# Patient Record
Sex: Male | Born: 1945 | Race: White | Hispanic: No | Marital: Married | Smoking: Former smoker
Health system: Southern US, Community
[De-identification: ages and names within clinical notes are randomized; demographics above are authoritative.]

## PROBLEM LIST (undated history)

## (undated) DIAGNOSIS — Z9981 Dependence on supplemental oxygen: Secondary | ICD-10-CM

## (undated) DIAGNOSIS — F17210 Nicotine dependence, cigarettes, uncomplicated: Secondary | ICD-10-CM

## (undated) DIAGNOSIS — I499 Cardiac arrhythmia, unspecified: Secondary | ICD-10-CM

## (undated) DIAGNOSIS — J45909 Unspecified asthma, uncomplicated: Secondary | ICD-10-CM

## (undated) DIAGNOSIS — I2510 Atherosclerotic heart disease of native coronary artery without angina pectoris: Secondary | ICD-10-CM

## (undated) DIAGNOSIS — J189 Pneumonia, unspecified organism: Secondary | ICD-10-CM

## (undated) DIAGNOSIS — R519 Headache, unspecified: Secondary | ICD-10-CM

## (undated) DIAGNOSIS — I4891 Unspecified atrial fibrillation: Secondary | ICD-10-CM

## (undated) DIAGNOSIS — K219 Gastro-esophageal reflux disease without esophagitis: Secondary | ICD-10-CM

## (undated) DIAGNOSIS — Z8719 Personal history of other diseases of the digestive system: Secondary | ICD-10-CM

## (undated) DIAGNOSIS — J449 Chronic obstructive pulmonary disease, unspecified: Secondary | ICD-10-CM

## (undated) DIAGNOSIS — R51 Headache: Secondary | ICD-10-CM

## (undated) DIAGNOSIS — I219 Acute myocardial infarction, unspecified: Secondary | ICD-10-CM

## (undated) DIAGNOSIS — M1990 Unspecified osteoarthritis, unspecified site: Secondary | ICD-10-CM

## (undated) DIAGNOSIS — R4702 Dysphasia: Secondary | ICD-10-CM

## (undated) DIAGNOSIS — I5022 Chronic systolic (congestive) heart failure: Secondary | ICD-10-CM

## (undated) HISTORY — PX: BOWEL RESECTION: SHX1257

## (undated) HISTORY — PX: INGUINAL HERNIA REPAIR: SUR1180

## (undated) HISTORY — PX: CARDIAC CATHETERIZATION: SHX172

## (undated) HISTORY — PX: HERNIA REPAIR: SHX51

## (undated) HISTORY — PX: FOREARM FRACTURE SURGERY: SHX649

## (undated) HISTORY — PX: FRACTURE SURGERY: SHX138

## (undated) HISTORY — PX: FOOT FRACTURE SURGERY: SHX645

## (undated) HISTORY — DX: Chronic obstructive pulmonary disease, unspecified: J44.9

## (undated) HISTORY — DX: Atherosclerotic heart disease of native coronary artery without angina pectoris: I25.10

## (undated) HISTORY — PX: ABDOMINAL HERNIA REPAIR: SHX539

## (undated) HISTORY — DX: Nicotine dependence, cigarettes, uncomplicated: F17.210

## (undated) HISTORY — PX: EXPLORATORY LAPAROTOMY: SUR591

---

## 1984-03-26 HISTORY — PX: EXPLORATORY LAPAROTOMY: SUR591

## 1988-03-26 HISTORY — PX: ORIF FOOT FRACTURE: SHX2123

## 1999-08-01 ENCOUNTER — Inpatient Hospital Stay (HOSPITAL_COMMUNITY): Admission: EM | Admit: 1999-08-01 | Discharge: 1999-08-02 | Payer: Self-pay | Admitting: Emergency Medicine

## 1999-08-08 ENCOUNTER — Encounter: Admission: RE | Admit: 1999-08-08 | Discharge: 1999-08-08 | Payer: Self-pay | Admitting: Sports Medicine

## 2004-01-26 ENCOUNTER — Ambulatory Visit: Payer: Self-pay | Admitting: Family Medicine

## 2004-03-15 ENCOUNTER — Ambulatory Visit: Payer: Self-pay | Admitting: Family Medicine

## 2004-04-07 ENCOUNTER — Ambulatory Visit: Payer: Self-pay | Admitting: Family Medicine

## 2004-04-08 ENCOUNTER — Inpatient Hospital Stay (HOSPITAL_COMMUNITY): Admission: EM | Admit: 2004-04-08 | Discharge: 2004-04-09 | Payer: Self-pay | Admitting: Emergency Medicine

## 2004-04-24 ENCOUNTER — Ambulatory Visit: Payer: Self-pay | Admitting: Internal Medicine

## 2004-05-30 ENCOUNTER — Ambulatory Visit: Payer: Self-pay | Admitting: Family Medicine

## 2004-06-01 ENCOUNTER — Ambulatory Visit: Payer: Self-pay | Admitting: Internal Medicine

## 2004-06-21 ENCOUNTER — Ambulatory Visit: Payer: Self-pay | Admitting: Family Medicine

## 2004-07-05 ENCOUNTER — Ambulatory Visit: Payer: Self-pay | Admitting: Family Medicine

## 2004-07-12 ENCOUNTER — Ambulatory Visit: Payer: Self-pay | Admitting: Internal Medicine

## 2004-07-26 ENCOUNTER — Ambulatory Visit: Payer: Self-pay | Admitting: Family Medicine

## 2004-09-05 ENCOUNTER — Ambulatory Visit: Payer: Self-pay | Admitting: Internal Medicine

## 2004-09-12 ENCOUNTER — Ambulatory Visit: Payer: Self-pay | Admitting: Internal Medicine

## 2004-09-25 ENCOUNTER — Ambulatory Visit: Payer: Self-pay | Admitting: Family Medicine

## 2004-10-26 ENCOUNTER — Ambulatory Visit: Payer: Self-pay | Admitting: Internal Medicine

## 2004-11-15 ENCOUNTER — Ambulatory Visit: Payer: Self-pay | Admitting: Family Medicine

## 2004-11-30 ENCOUNTER — Ambulatory Visit: Payer: Self-pay | Admitting: Family Medicine

## 2004-12-14 ENCOUNTER — Ambulatory Visit: Payer: Self-pay | Admitting: Family Medicine

## 2004-12-16 ENCOUNTER — Emergency Department (HOSPITAL_COMMUNITY): Admission: EM | Admit: 2004-12-16 | Discharge: 2004-12-16 | Payer: Self-pay | Admitting: Emergency Medicine

## 2004-12-19 ENCOUNTER — Ambulatory Visit: Payer: Self-pay | Admitting: Family Medicine

## 2005-01-05 ENCOUNTER — Ambulatory Visit: Payer: Self-pay | Admitting: Family Medicine

## 2005-01-29 ENCOUNTER — Ambulatory Visit: Payer: Self-pay | Admitting: Family Medicine

## 2005-03-02 ENCOUNTER — Ambulatory Visit: Payer: Self-pay | Admitting: Family Medicine

## 2005-04-02 ENCOUNTER — Ambulatory Visit: Payer: Self-pay | Admitting: Family Medicine

## 2005-04-26 ENCOUNTER — Encounter (HOSPITAL_COMMUNITY): Admission: RE | Admit: 2005-04-26 | Discharge: 2005-05-26 | Payer: Self-pay | Admitting: Preventative Medicine

## 2005-05-21 ENCOUNTER — Ambulatory Visit: Payer: Self-pay | Admitting: Family Medicine

## 2005-05-24 HISTORY — PX: INCISIONAL HERNIA REPAIR: SHX193

## 2005-06-04 ENCOUNTER — Emergency Department (HOSPITAL_COMMUNITY): Admission: EM | Admit: 2005-06-04 | Discharge: 2005-06-04 | Payer: Self-pay | Admitting: Emergency Medicine

## 2005-06-15 ENCOUNTER — Inpatient Hospital Stay (HOSPITAL_COMMUNITY): Admission: RE | Admit: 2005-06-15 | Discharge: 2005-06-22 | Payer: Self-pay | Admitting: General Surgery

## 2005-10-16 ENCOUNTER — Ambulatory Visit: Payer: Self-pay | Admitting: Family Medicine

## 2006-04-01 ENCOUNTER — Ambulatory Visit: Payer: Self-pay | Admitting: Internal Medicine

## 2006-07-15 ENCOUNTER — Emergency Department (HOSPITAL_COMMUNITY): Admission: EM | Admit: 2006-07-15 | Discharge: 2006-07-15 | Payer: Self-pay | Admitting: Emergency Medicine

## 2006-08-07 ENCOUNTER — Emergency Department (HOSPITAL_COMMUNITY): Admission: EM | Admit: 2006-08-07 | Discharge: 2006-08-07 | Payer: Self-pay | Admitting: Emergency Medicine

## 2006-09-24 HISTORY — PX: VENTRAL HERNIA REPAIR: SHX424

## 2006-09-24 HISTORY — PX: INGUINAL HERNIA REPAIR: SUR1180

## 2006-10-15 ENCOUNTER — Encounter (INDEPENDENT_AMBULATORY_CARE_PROVIDER_SITE_OTHER): Payer: Self-pay | Admitting: General Surgery

## 2006-10-15 ENCOUNTER — Ambulatory Visit (HOSPITAL_COMMUNITY): Admission: RE | Admit: 2006-10-15 | Discharge: 2006-10-16 | Payer: Self-pay | Admitting: General Surgery

## 2007-07-22 ENCOUNTER — Ambulatory Visit: Payer: Self-pay | Admitting: Pulmonary Disease

## 2007-07-22 ENCOUNTER — Ambulatory Visit: Payer: Self-pay | Admitting: Internal Medicine

## 2007-07-22 DIAGNOSIS — J449 Chronic obstructive pulmonary disease, unspecified: Secondary | ICD-10-CM | POA: Insufficient documentation

## 2007-07-22 DIAGNOSIS — J439 Emphysema, unspecified: Secondary | ICD-10-CM | POA: Insufficient documentation

## 2007-08-15 ENCOUNTER — Ambulatory Visit: Payer: Self-pay | Admitting: Internal Medicine

## 2007-08-15 DIAGNOSIS — I10 Essential (primary) hypertension: Secondary | ICD-10-CM | POA: Insufficient documentation

## 2007-08-15 DIAGNOSIS — F17200 Nicotine dependence, unspecified, uncomplicated: Secondary | ICD-10-CM | POA: Insufficient documentation

## 2007-09-02 ENCOUNTER — Ambulatory Visit: Payer: Self-pay | Admitting: Internal Medicine

## 2007-09-29 ENCOUNTER — Emergency Department (HOSPITAL_COMMUNITY): Admission: EM | Admit: 2007-09-29 | Discharge: 2007-09-29 | Payer: Self-pay | Admitting: Emergency Medicine

## 2007-10-01 ENCOUNTER — Ambulatory Visit: Payer: Self-pay | Admitting: Internal Medicine

## 2007-11-03 ENCOUNTER — Ambulatory Visit: Payer: Self-pay | Admitting: Internal Medicine

## 2007-11-07 ENCOUNTER — Encounter: Payer: Self-pay | Admitting: Internal Medicine

## 2007-11-17 ENCOUNTER — Telehealth (INDEPENDENT_AMBULATORY_CARE_PROVIDER_SITE_OTHER): Payer: Self-pay | Admitting: *Deleted

## 2008-12-01 ENCOUNTER — Ambulatory Visit (HOSPITAL_COMMUNITY): Admission: RE | Admit: 2008-12-01 | Discharge: 2008-12-01 | Payer: Self-pay | Admitting: Family Medicine

## 2008-12-23 ENCOUNTER — Ambulatory Visit (HOSPITAL_COMMUNITY): Admission: RE | Admit: 2008-12-23 | Discharge: 2008-12-23 | Payer: Self-pay | Admitting: Family Medicine

## 2010-08-08 NOTE — Op Note (Signed)
NAME:  CHEY, CHO NO.:  [id number]   MEDICAL RECORD NO.:  [id number]          PATIENT TYPE:  OIB   LOCATION:  5731                         FACILITY:  [facility]   PHYSICIAN:  Cherylynn Ridges, M.D.    DATE OF BIRTH:  1945/06/24   DATE OF PROCEDURE:  10/15/2006  DATE OF DISCHARGE:                               OPERATIVE REPORT   PREOPERATIVE DIAGNOSES:  1. Recurrent ventral hernia.  2. Recurrent right inguinal hernia.   POSTOPERATIVE DIAGNOSES:  1. An 8 x 6 cm upper recurrent ventral hernia.  2. Direct and indirect recurrent right inguinal hernia.   SURGEON:  Cherylynn Ridges, M.D.   ASSISTANT:  Leonie Man, M.D.   ANESTHESIA:  General endotracheal.   ESTIMATED BLOOD LOSS:  Less than 50 mL.   COMPLICATIONS:  None.   CONDITION:  Stable.   FINDINGS:  The patient had a 6 x 8 cm ventral hernia in the upper  portion of the previous abdominal incision done from trauma.  In the  right groin, he had a recurrent indirect and direct hernia, both of  which were repaired with mesh.   OPERATION:  The patient was taken to the operating room and placed on  the table in the supine position.  After an adequate endotracheal  anesthetic was administered, he was prepped and draped in the usual  sterile manner, exposing the midline and the right lower quadrant.   We started off repairing the ventral hernia initially.  We excised the  patient's entire abdominal incision which was widened from previous  scarring.  We took it down and removed part of the scarred flap and took  it down to the midline fascia.   In the supraumbilical portion of the incision, there were multiple  fascial defects.  Most of the hernia came out to the right side and was  in a subcutaneous position on the right.  We dissected out this area and  also the hernia sac and its edges.  We opened the peritoneal cavity and  dissected away adhesions around the anterior abdominal wall.  We saw  multiple what  appeared to be Prolene-like sutures.  We made  circumferential flaps around the hernia defect which measured  approximately 6 x 8 cm in size using electrocautery.  The upper portion  of the fascia was weakened also but we did get back to good fascia prior  to placing a piece of onlay Proceed mesh which was cut down from 6 x 8  inches to about 4 x 6 inches.   We used horizontal mattress sutures of #1 Novafil in order to place the  onlay mesh with the rough side facing upward, the smooth side facing  downward.  This was after we had circumferentially taken down all  adhesions and made sufficient flaps.  A total of 8 horizontal mattress  sutures were placed securing the mesh in place and then we did a primary  repair on top of that using interrupted figure-of-eight stitches of #1  Novafil followed by a running stitch of #1 Novafil.  Once this was  closed, we irrigated subcutaneous with saline and antibiotic solution in  which the mesh had been soaked prior to implantation.  We then closed  the subcutaneous tissue using running 2-0 Vicryl and then skin with  stainless steel staples.   We then proceeded to repair the right groin hernia by making a  transverse curvilinear incision at the level of the superficial ring.  We dissected down to and through the Scarpa fascia into the subcutaneous  tissue and down to the fascia of the external oblique.  We were able to  find the Poupart ligament and dissect away from that towards the hernia  which is coming out through the superficial ring.  Once we had  adequately cleaned off the external oblique fascia, we made an opening  and using Metzenbaum scissors going down through the superficial ring.   The patient's spermatic cord was very thinned out from previous surgery  obviously and notes very little pampiniform plexus or arterial blood  flow but we could find the vas deferens.  We mobilized the spermatic  cord and actually separated away from the  recurrent indirect side which  came off anteriorly and medially.  Once we had dissected away this  indirect sac sufficiently, we tied it off at its neck using two suture  ligatures of 0 Ethibond.  We resected excess fat using a 15 blade.  We  then noticed that it was a direct defect more towards the pubic tubercle  which we repaired by placing onlay mesh measuring approximately 4 x 2 cm  in size, attaching it to the pubic tubercle area and conjoint tendon  anteromedially and reflected a portion of the inguinal ligament  inferolaterally.  The protruding direct sac was actually placed back  underneath the defect and then oversewn with three stitches of 0  Ethibond sutures to keep it in place as we placed the mesh in.  The mesh  was placed using a running 0 Prolene.  Once this was done, we irrigated  with antibiotic solution which the mesh had been soaked prior to  implantation.  We then placed the spermatic cord back into the inguinal  canal, reapproximated the external oblique fascia on top of using 3-0  Vicryl.  We then reapproximated the Scarpa fascia using interrupted 3-0  Vicryl and then the skin was closed using stainless steel staples.  Our  needle count, sponge counts and instrument counts were correct.  Sterile  dressings were applied to all wounds.      Cherylynn Ridges, M.D.  Electronically Signed     JOW/MEDQ  D:  10/15/2006  T:  10/15/2006  Job:  [id number]   cc:   Dr. Morrie Sheldon

## 2010-08-11 NOTE — H&P (Signed)
NAME:  Devin Barton, TULLIS NO.:  [id number]   MEDICAL RECORD NO.:  [id number]          PATIENT TYPE:  INP   LOCATION:  0343                         FACILITY:  [facility]   PHYSICIAN:  Toby L. Catalina Pizza, M.D.   DATE OF BIRTH:  01-25-1946   DATE OF ADMISSION:  04/07/2004  DATE OF DISCHARGE:                                HISTORY & PHYSICAL   PRIMARY CARE PHYSICIAN:  Delaney Meigs, M.D. of [place] [place].   REASON FOR VISIT:  Increased shortness of breath and wheezing.   HISTORY OF PRESENT ILLNESS:  Devin Barton is a 65 year old male with a history  of COPD. He presents to the ED today with a 3- to 4-day history of increased  shortness of breath and wheezing. Over the past 2 weeks the patient has been  seen by his primary care doctor on 3 occasions. Per the patient, he was  started on steroids. The steroids have helped some. However, the patient  does continue to have episodes of increased wheezing and shortness of  breath. The patient also has chest congestion. However, he is having some  difficulty getting the sputum up. He does describe pleuritic-like chest pain  that occurs mainly with coughing episodes and deep breaths. This chest pain  is sharp in nature, there is no radiation, it is very brief in duration.  There is no chest pain with ambulation/exertion. There has been no fever or  chills.   PAST MEDICAL HISTORY:  1.  COPD.  2.  Trauma due to falling from a tree.   PAST SURGICAL HISTORY:  The patient had a hernia repair; the patient  sustained this hernia when he fell from the tree.   MEDICINES:  Combivent and Advair.   ALLERGIES:  No known drug allergies.   SOCIAL HISTORY:  The patient smokes 1/2 pack per day for approximately 30  years. He denies alcohol and IV drug abuse. He is married and lives in  [place].   FAMILY HISTORY:  The patient states that both mother and father are healthy.   REVIEW OF SYSTEMS:  A complete review of systems was  obtained, the review  was negative except for that stated in the HPI.   PHYSICAL EXAMINATION:  VITAL SIGNS:  Temperature is 98.4, blood pressure is  126/81, pulse of 76, respiratory rate is 22.  HEENT:  Pupils were equally round and reactive to light. Extraocular muscles  were intact. There was no scleral icterus. Oropharynx was clear and moist,  there was no erythema or thrush. Tympanic membranes were clear bilaterally,  no erythema.  NECK:  No JVD, no carotid bruit, no adenopathy.  HEART:  Regular rate and rhythm; no murmurs, rubs or gallops.  LUNGS:  Decreased air movement bilaterally, scattered wheezes.  ABDOMEN:  Positive bowel sounds, nontender, nondistended, midline scar due  to previous surgery.  EXTREMITIES:  No edema, no cyanosis.   LABORATORY:  A pH was 7.408, PCO2 38.6, PO2 73.4, bicarbonate 23.9; this is  on 2 liters. WBC count 10.4, hemoglobin 14.4, hematocrit 42.6, platelets of  318. Sodium 138, potassium 4.1,  chloride 105, carbon dioxide 25, glucose 99,  BUN 11, creatinine 0.9, calcium 9.2. UA was only significant for 15 ketones.  Chest x-ray showed no acute changes. EKG revealed a normal sinus rhythm and  LVH; of note, there was no previous EKG for comparison.   ASSESSMENT AND PLAN:  1.  Chronic obstructive pulmonary disease exacerbation. Will admit the      patient to a telemetry bed. I will start the patient on Solu-Medrol 125      mg IV q.6h. In addition, the patient will be started on Rocephin and      azithromycin. He will be provided nebulizer treatments with both      Atrovent and albuterol every 4 hours and then as needed. The patient      will also be placed on oxygen at 2 liters.  2.  Pleuritic-like chest pain. I feel that the chest pain is most likely due      to the patient's coughing and shortness of breath. However, he tells me      that it has been going on for 1-2 weeks now. Due to the persistence of      the pain, I will check 3 sets of cardiac  enzymes and repeat an EKG in      the a.m.  3.  The patient is a full code.      TLF/MEDQ  D:  04/08/2004  T:  04/08/2004  Job:  [id number]

## 2010-08-11 NOTE — Discharge Summary (Signed)
[facility]  Patient:    Devin Barton, Devin Barton                         MRN: [id number] Adm. Date:  [id number] Disc. Date: [id number] Attending:  Garnette Scheuermann Dictator:   Cheree Ditto, M.D. CC:         Dr. Celene Skeen, [facility]                           Discharge Summary  DISCHARGE DIAGNOSES: 1. Syncope, likely secondary to dehydration or hypotension, hypoxia still in    the differential. 2. Febrile respiratory illness, probable early pneumonia. 3. Chronic obstructive pulmonary disease.  DISCHARGE MEDICATIONS: 1. Azithromycin 250 mg 1 p.o. q.d. x 3 more days. 2. Combivent 2 puffs q.i.d.  PROCEDURES:  None.  CONSULTATIONS:  None.  HISTORY AND PHYSICAL:  Please see complete dictated history and physical from Aug 01, 1999.  HOSPITAL COURSE:  Mr. Hintz is a 65 year old male who presented after having a syncopal episode in his primary doctors office.  #1 - SYNCOPE:  The patient was admitted and monitored on telemetry.  He had no further syncopal episodes during hospitalization.  No arrhythmias were noted on telemetry.  He did have some orthostatic changes on admission, which resolved by the time of discharge after IV fluid rehydration.  The patient had normal room air O2 saturations throughout the hospitalization.  The etiology of his syncope was felt to be secondary to hypotension related to an early pneumonia.  #2 - FEBRILE RESPIRATORY ILLNESS:  The patient did not have a definite infiltrate on chest x-ray but he did have some increased lung markings felt to be an early pneumonia, especially given his fever.  He was treated with azithromycin, which was continued on discharge for a total of five days of antibiotics.  He was afebrile and feeling well throughout the day of discharge.  #3 - CHRONIC OBSTRUCTIVE PULMONARY DISEASE:  The patient was started on Combivent 2 puffs q.i.d.  It was thought that he was on theophylline  but this turned out to be incorrect.  He was discharged on Combivent.  We recommend pulmonary function tests as an outpatient to further define his lung disease and help to maximally manage his problem.  DISPOSITION:  The patient was discharged in improved condition with normal room air O2 saturations and no fever.  FOLLOW-UP:  Appointment made with Dr. Celene Skeen at [facility] on Monday, Aug 07, 1999, at 8:15 a.m. DD:  08/02/99 TD:  08/04/99 Job: [id number]

## 2010-08-11 NOTE — Op Note (Signed)
NAME:  Devin Barton, Devin Barton NO.:  [id number]   MEDICAL RECORD NO.:  [id number]          PATIENT TYPE:  INP   LOCATION:  A303                          FACILITY:  [facility]   PHYSICIAN:  Dirk Dress. Katrinka Blazing, M.D.   DATE OF BIRTH:  1946-02-01   DATE OF PROCEDURE:  DATE OF DISCHARGE:                                 OPERATIVE REPORT   PREOPERATIVE DIAGNOSIS:  Incisional hernia.   POSTOPERATIVE DIAGNOSIS:  Incisional hernia.   PROCEDURE:  Incisional hernia repair.   SURGEON:  Dirk Dress. Katrinka Blazing, M.D.   DESCRIPTION OF PROCEDURE:  Under general anesthesia, the patient's abdomen  was prepped and draped in a sterile field.  The old midline incision was  excised.  The excision extended down to the subcutaneous tissue.  The  patient had four areas of herniation through the fascia in the  supraumbilical midline.  These areas were connected.  The underlying bowel  and omentum were separated from the fascia.  The inferior portion of the  fascia appeared to be intact.  Once good fascial margins were obtained, it  was felt that the incision was going to be too tight to close primarily, so  a 3 x 7 cm matrix allograft was used for closure.  This was sewn in using  running #1 Prolene.  The JP drain was placed over the graft.  The  subcutaneous tissue was closed with 2-0 Monocryl.  The skin was closed with  staples.  The drain was secured with 3-0 nylon.  A sterile dressing was  placed.  The patient tolerated the procedure well.  He was awakened from  anesthesia, transferred to a bed, and taken to the Post Anesthetic Care Unit  for monitoring.      Dirk Dress. Katrinka Blazing, M.D.  Electronically Signed     LCS/MEDQ  D:  06/15/2005  T:  06/19/2005  Job:  [id number]   cc:   Delaney Meigs, M.D.  Fax: [phone]

## 2010-08-11 NOTE — Assessment & Plan Note (Signed)
[facility]                             PULMONARY OFFICE NOTE   NAME:Sikorski, ANDRZEJ SCULLY                       MRN:          [id number]  DATE:04/01/2006                            DOB:          May 17, 1945    PULMONARY/FOLLOWUP OFFICE VISIT   HISTORY:  This is a 65 year old white male, active smoker with COPD with  an FEV1 of 40% documented September 05, 2004,  who ran out of Advair over 2  months ago and comes in today with increasing symptoms of cough and  congestion with yellow sputum production over the 3 days. He denies any  pleuritic pain, fevers, chills, orthopnea, PND or leg swelling.   Note, his last Combivent dose was 3 hours ago.   PHYSICAL EXAMINATION:  He is a depressed-appearing, ambulatory, white  male in no acute distress. He has stable vital signs.  HEENT: Is unremarkable. Oropharynx is clear. No thrush.  NECK: Supple without cervical adenopathy or tenderness. Trachea is  midline. No thyromegaly.  LUNGS: Lung fields reveal junky expiratory rhonchi with prominent pseudo  wheeze as well as which improved with purse lip maneuver.  HEART: Regular rate and rhythm without murmur, gallop or rub.  ABDOMEN: Soft, benign.  EXTREMITIES: Warm without calf tenderness, cyanosis, clubbing or edema.   Hemoglobin saturation is 94% on room air.   IMPRESSION:  Chronic obstructive pulmonary disease with an active  asthmatic component secondary to active smoking against medical advice.  I have told the patient point blank that if he continues to smoke there  will be very little I can do for him. If he quits smoking he probably  does not need to return here at all, but could be easily controlled with  Advair mono therapy.   For today, I spent extra time teaching him how to use Advair effectively  250/50 b.i.d. and gave him samples. I also reviewed with him optimal MDI  technique and asked him to continue Combivent 2 puffs every 4 hours  p.r.n.   To treat  him acutely, I did recommend Omnicef for 7 day course, 300 mg  b.i.d. and Mucinex DM along with a 6 day course of prednisone.   Followup will be in 3 months, sooner if needed.    Charlaine Dalton. Sherene Sires, MD, [facility]  Electronically Signed   MBW/MedQ  DD: 04/01/2006  DT: 04/01/2006  Job #: [id number]   cc:   Delaney Meigs, M.D.

## 2010-08-11 NOTE — Discharge Summary (Signed)
NAME:  Devin, Barton NO.:  [id number]   MEDICAL RECORD NO.:  [id number]          PATIENT TYPE:  INP   LOCATION:  A303                          FACILITY:  [facility]   PHYSICIAN:  Dirk Dress. Katrinka Blazing, M.D.   DATE OF BIRTH:  10-Nov-1945   DATE OF ADMISSION:  06/15/2005  DATE OF DISCHARGE:  03/30/2007LH                                 DISCHARGE SUMMARY   DISCHARGE DIAGNOSIS:  1.  Incisional hernia.  2.  Bronchial asthma.  3.  Postoperative ileus.   SPECIAL PROCEDURE:  Incisional hernia repair with Matrix allograft June 15, 2005.   DISPOSITION:  The patient discharged home in stable satisfactory condition.   DISCHARGE MEDICATIONS:  1.  Reglan 10 mg a.c. and h.s.  2.  Zelnorm 60 mg twice daily.  3.  Tylox 1-2 every 4 hours as needed for pain.  4.  Singulair 10 mg daily.  5.  Lipitor 10 mg daily.  6.  Advair 250/50 twice daily.  7.  Spiriva once daily.  8.  Keflex 500 mg four times daily.  The patient is scheduled to be seen in      the office one week post discharge.   SUMMARY:  A 65 year old male with a history of enlarging incisional hernia.  He is status post self-inflicted abdominal wound in 1986.  The wound had  been previously repaired at [facility].  He presented with a large  incisional hernia.  Past history is positive for asthma.  Lungs revealed  positive rhonchi and wheezes.  Abdominal exam revealed large midline  incisional hernia at the apex of his incision in the periumbilical area.  The patient underwent repair of his incisional hernia using 3 cm x 7 cm  Matrix allograft on June 15, 2005.  He had postoperative ileus with slow  return of intestinal function.  He was treated with nasogastric  decompression and because of ileus.  After return of intestinal function,  his diet was advanced.  Nasogastric tube was clamped.  Nausea and vomiting  resolved.  He started having regular bowel movements and was discharged home  on the seventh postoperative  day in satisfactory condition.      Dirk Dress. Katrinka Blazing, M.D.  Electronically Signed    LCS/MEDQ  D:  08/11/2005  T:  08/12/2005  Job:  [id number]

## 2010-08-11 NOTE — H&P (Signed)
NAME:  Devin Barton, Devin Barton NO.:  [id number]   MEDICAL RECORD NO.:  [id number]          PATIENT TYPE:  AMB   LOCATION:  DAY                           FACILITY:  [facility]   PHYSICIAN:  Jerolyn Shin C. Katrinka Blazing, M.D.   DATE OF BIRTH:  1946/02/02   DATE OF ADMISSION:  DATE OF DISCHARGE:  LH                                HISTORY & PHYSICAL   This is a 65 year old male with history of enlarging incisional hernia.  He  is status post a self-inflicted abdominal wound in 1986.  It was repaired at  [facility].  He presents with a large incision hernia that has been  progressively enlarging.  He is scheduled for incisional hernia repair.   PAST MEDICAL HISTORY:  Positive for:  1.  Asthma.  2.  Hyperlipidemia.   MEDICATIONS:  1.  Singulair 10 mg daily.  2.  Lipitor 10 mg daily.  3.  Advair 250/50 twice daily.  4.  Spiriva once daily.   PAST SURGICAL HISTORY:  1.  Exploratory laparotomy in 1986.  2.  Open reduction and internal fixation left arm in 1990.   SOCIAL HISTORY:  He is employed at [employer].  He smokes 6 cigarettes a  day.  He has not had any alcohol intake for over 30 years.   PHYSICAL EXAMINATION:  VITAL SIGNS: Blood pressure 152/80, pulse 80,  respirations 20, weight 174 pounds.  HEENT:  Unremarkable.  NECK:  Supple with no JVP, bruit, adenopathy, or thyromegaly.  CHEST: Positive rhonchi, positive wheezes.  HEART:  Regular rate and rhythm without murmur, gallop, or rub.  ABDOMEN: Soft.  There is a large midline incisional hernia at the apex of  the incision and in the periumbilical area.  EXTREMITIES:  1+ edema.  No cyanosis or clubbing.  NEUROLOGIC:  No focal motor, sensory, or cerebellar deficits.   IMPRESSION:  1.  Incisional hernia.  2.  Bronchial asthma.   PLAN:  Incisional hernia repair.      Dirk Dress. Katrinka Blazing, M.D.  Electronically Signed     LCS/MEDQ  D:  06/14/2005  T:  06/14/2005  Job:  [id number]   cc:   Delaney Meigs, M.D.  Fax:  [phone]   [facility]  Fax: [phone]

## 2010-08-11 NOTE — Discharge Summary (Signed)
NAME:  Devin Barton, Devin Barton                ACCOUNT NO.:  [id number]   MEDICAL RECORD NO.:  [id number]          PATIENT TYPE:  INP   LOCATION:  0343                         FACILITY:  [facility]   PHYSICIAN:  Mobolaji B. Bakare, M.D.DATE OF BIRTH:  May 17, 1945   DATE OF ADMISSION:  04/07/2004  DATE OF DISCHARGE:  04/09/2004                                 DISCHARGE SUMMARY   PRIMARY CARE PHYSICIAN:  Dr. Lysbeth Galas in [place].   FINAL DIAGNOSES:  1.  Chronic obstructive pulmonary disease exacerbation.  2.  Tobacco abuse.   CHIEF COMPLAINT:  Shortness of breath and wheezing.   BRIEF HISTORY:  Please refer to the admission history and physical.  In  brief, Devin Barton is a 65 year old Caucasian male with history of COPD.  He  presented with exacerbation of COPD and was admitted for acute management.  He was afebrile.   PERTINENT PHYSICAL FINDINGS:  Vital signs on admission:  Temperature 98.4,  blood pressure 126/81, pulse of 76, respiratory rate of 22.  He was dyspneic  on initial evaluation.  Main findings were in the respiratory exam.  There  was reduced air entry bilaterally with scattered wheezes.  The rest of his  physical examination were within normal.   PERTINENT LABORATORY DATA:  Sodium 137, potassium 3.7, chloride 108, bicarb  23, BUN 11, creatinine 0.9, glucose 185.  White cell 11.5, hematocrit 40.9,  hemoglobin 13.7, platelets 316, MCV 93.  Please note that initial white cell  count on admission was 7.4.  The patient was placed on Solu-Medrol.  Radiologic findings:  Chest x-ray:  No acute cardiopulmonary findings.  EKG:  Normal sinus rhythm with a QTC of 476.  This was later rechecked.  QTC at  time of discharge was 439.   HOSPITAL COURSE:  CHRONIC OBSTRUCTIVE PULMONARY DISEASE EXACERBATION.  Mr.  Barton has significant history of cigarette smoking.  He smokes a half a  pack per day for approximately 30 years and he wishes to quit smoking.  We  discussed at length regarding the benefits  of quitting smoking and the  patient agrees to smoking cessation.  He claims he has tried patches in the  past.  He did agree to try Zyban and nicotine patch combination.  Was  treated with IV Solu-Medrol, ceftriaxone, and Zithromax.  He was placed on  sliding scale insulin with respect to hyperglycemia secondary to steroid.  The patient may a remarkable turn-around and within 48 hours lungs were  clear, no more wheezes, and still there was reduced air entry.  It was then  felt that the patient could be discharged home.  He was ambulated in the  hallway and did not drop his O2 saturation.  The patient was discharged home  in a stable condition.   DISCHARGE MEDICATIONS:  1.  Combivent and Advair inhalers as before.  2.  Avelox 400 mg p.o. daily for 6 days.  3.  Wellbutrin 150 mg p.o. b.i.d.  4.  Prednisone tapering dose.  5.  Nicotine patch 21 mg daily.   He was encouraged to continue to quit smoking.  Follow-up  with Dr. Lysbeth Galas in  1-2 weeks and the patient to call for an appointment.      MBB/MEDQ  D:  04/17/2004  T:  04/17/2004  Job:  [id number]   cc:   Delaney Meigs, M.D.  [address]  [place]  [place] [address]  Fax: [phone]

## 2010-09-08 ENCOUNTER — Encounter: Payer: Self-pay | Admitting: Internal Medicine

## 2010-09-13 ENCOUNTER — Ambulatory Visit (INDEPENDENT_AMBULATORY_CARE_PROVIDER_SITE_OTHER): Payer: Self-pay | Admitting: Internal Medicine

## 2010-09-13 ENCOUNTER — Encounter: Payer: Self-pay | Admitting: Internal Medicine

## 2010-09-13 VITALS — BP 134/84 | HR 83 | Temp 97.5°F | Ht 68.0 in | Wt 157.0 lb

## 2010-09-13 DIAGNOSIS — J449 Chronic obstructive pulmonary disease, unspecified: Secondary | ICD-10-CM

## 2010-09-13 DIAGNOSIS — F17200 Nicotine dependence, unspecified, uncomplicated: Secondary | ICD-10-CM

## 2010-09-13 MED ORDER — FLUTICASONE-SALMETEROL 250-50 MCG/DOSE IN AEPB: 1.0000 | INHALATION_SPRAY | Freq: Two times a day (BID) | RESPIRATORY_TRACT | Status: DC

## 2010-09-13 NOTE — Progress Notes (Signed)
Subjective:     Patient ID: Devin Barton, male   DOB: [birth year], 65 y.o.   MRN: [id number]  HPI  2  yowm with  GOLD III COPD current smoker  seen 4/28 for 3 weeks of cough, wheezing, DOE, yellow mucus worse at night. Had been started on ACE I for HTN 1 month prior to this exacerbation and was treated with a course of antibiotics and prednisone but still had difficulites with cough and dyspnea. Better overall after changed over to Symbicort.  Returned 6/9 improved with less cough and dyspnea. Symbicort costs $50 co-pay-so changed back to advair and on return 7/8 having again severe paroxysms of cough and dyspnea to the point where he can no longer work. I only found out about this at the end of the visit when he requested a work excuse. apparently this occurred after a spell where he lost his voice began feeling choked and very short of breath.   November 03, 2007 ov says couldn't take symbicort due dizziness, using combivent up 3 x days and still smoking one half per day.  rec stop smoking  09/13/10 ov cc worse doe since ran out of money to buy combivent but  Rarely now smoking.  No sign excess/ purulent mucus. Pt denies any significant sore throat, dysphagia, itching, sneezing,  nasal congestion or excess/ purulent secretions,  fever, chills, sweats, unintended wt loss, pleuritic or exertional cp, hempoptysis, orthopnea pnd or leg swelling.    Also denies any obvious fluctuation of symptoms with weather or environmental changes or other aggravating or alleviating factors.      Past Medical History:  Reviewed history from 10/01/2007 and no changes required:  HYPERTENSION, BENIGN (ICD-401.1)  CIGARETTE SMOKER (ICD-305.1)  COPD (ICD-496) FEV1 1.29 (40%) ratio 42%    Family History:   negative for respiratory disease atopy  positive heart disease in his mother       Review of Systems  Constitutional: Positive for unexpected weight change. Negative for fever, chills, activity change and  appetite change.  HENT: Positive for congestion and sinus pressure. Negative for sore throat, rhinorrhea, sneezing, trouble swallowing, dental problem, voice change and postnasal drip.   Eyes: Negative for visual disturbance.  Respiratory: Positive for cough and shortness of breath. Negative for choking.   Cardiovascular: Negative for chest pain and leg swelling.  Gastrointestinal: Negative for nausea, vomiting and abdominal pain.  Genitourinary: Negative for difficulty urinating.  Musculoskeletal: Negative for arthralgias.  Skin: Negative for rash.  Psychiatric/Behavioral: Negative for behavioral problems and confusion.       Objective:   Physical Exam    thin amb wm with unusual affect Wt 157 09/13/10 HEENT mild turbinate edema.  Oropharynx no thrush or excess pnd or cobblestoning.  No JVD or cervical adenopathy. Mild accessory muscle hypertrophy. Trachea midline, nl thryroid. Chest was hyperinflated by percussion with diminished breath sounds and moderate increased exp time without wheeze. Hoover sign positive at mid inspiration. Regular rate and rhythm without murmur gallop or rub or increase P2 or edema.  Abd: no hsm, nl excursion. Ext warm without cyanosis or clubbing.   Assessment:         Plan:

## 2010-09-13 NOTE — Patient Instructions (Addendum)
Advair 250/50 twice daily - smooth deep breath then rinse and gargle  Continue to use  ventolin but only use as needed if resting first  doesn't help your breathing.   Return here if not happy with the advair or if the doctors in [place] feel you need a pulmonary evaluation

## 2010-09-15 NOTE — Assessment & Plan Note (Signed)
C/w GOLD III with variable component so best choice is restart advair 250/50 bid and work harder on smoking cessation

## 2010-09-15 NOTE — Assessment & Plan Note (Signed)
I emphasized that although we never turn away smokers from the pulmonary clinic, we do ask that they understand that the recommendations that we make  won't work nearly as well in the presence of continued cigarette exposure.  In fact, we may very well  reach a point where we can't promise to help the patient if he/she can't quit smoking. (We can and will promise to try to help, we just can't promise what we recommend will really work)  

## 2010-12-21 LAB — BASIC METABOLIC PANEL
CO2: 27
Calcium: 9.5
Creatinine, Ser: 0.96
GFR calc Af Amer: >60
GFR calc non Af Amer: >60

## 2010-12-21 LAB — CBC
MCHC: 33.6
RBC: 4.3
RDW: 14.7

## 2010-12-21 LAB — URINALYSIS, ROUTINE W REFLEX MICROSCOPIC
Hgb urine dipstick: NEGATIVE
Nitrite: NEGATIVE
Protein, ur: NEGATIVE
Urobilinogen, UA: 0.2

## 2011-01-08 LAB — BASIC METABOLIC PANEL
CO2: 28
Calcium: 9.7
GFR calc Af Amer: >60
GFR calc non Af Amer: >60
Sodium: 139

## 2011-01-08 LAB — DIFFERENTIAL
Lymphocytes Relative: 32
Lymphs Abs: 3.4 — ABNORMAL HIGH
Monocytes Absolute: 0.9 — ABNORMAL HIGH
Monocytes Relative: 8
Neutro Abs: 6

## 2011-01-08 LAB — CBC
Hemoglobin: 14.3
RBC: 4.62

## 2011-09-20 ENCOUNTER — Other Ambulatory Visit: Payer: Self-pay | Admitting: Internal Medicine

## 2012-04-09 ENCOUNTER — Other Ambulatory Visit: Payer: Self-pay | Admitting: Internal Medicine

## 2012-05-09 ENCOUNTER — Other Ambulatory Visit: Payer: Self-pay | Admitting: Internal Medicine

## 2012-05-13 NOTE — Addendum Note (Signed)
Addended by: Abigail Miyamoto D on: 05/13/2012 11:17 AM   Modules accepted: Orders

## 2012-06-05 ENCOUNTER — Inpatient Hospital Stay (HOSPITAL_COMMUNITY)
Admission: EM | Admit: 2012-06-05 | Discharge: 2012-06-07 | DRG: 191 | Disposition: A | Discharge status: Home / Self Care | Payer: Medicare Other | Attending: Internal Medicine | Admitting: Internal Medicine

## 2012-06-05 ENCOUNTER — Encounter (HOSPITAL_COMMUNITY): Payer: Self-pay | Admitting: Emergency Medicine

## 2012-06-05 ENCOUNTER — Inpatient Hospital Stay (HOSPITAL_COMMUNITY): Payer: Medicare Other

## 2012-06-05 ENCOUNTER — Emergency Department (HOSPITAL_COMMUNITY): Payer: Medicare Other

## 2012-06-05 DIAGNOSIS — F17200 Nicotine dependence, unspecified, uncomplicated: Secondary | ICD-10-CM | POA: Diagnosis present

## 2012-06-05 DIAGNOSIS — R739 Hyperglycemia, unspecified: Secondary | ICD-10-CM | POA: Diagnosis present

## 2012-06-05 DIAGNOSIS — J189 Pneumonia, unspecified organism: Secondary | ICD-10-CM

## 2012-06-05 DIAGNOSIS — E440 Moderate protein-calorie malnutrition: Secondary | ICD-10-CM | POA: Diagnosis present

## 2012-06-05 DIAGNOSIS — I10 Essential (primary) hypertension: Secondary | ICD-10-CM | POA: Diagnosis present

## 2012-06-05 DIAGNOSIS — R7309 Other abnormal glucose: Secondary | ICD-10-CM | POA: Diagnosis present

## 2012-06-05 DIAGNOSIS — T380X5A Adverse effect of glucocorticoids and synthetic analogues, initial encounter: Secondary | ICD-10-CM | POA: Diagnosis present

## 2012-06-05 DIAGNOSIS — Z79899 Other long term (current) drug therapy: Secondary | ICD-10-CM

## 2012-06-05 DIAGNOSIS — R634 Abnormal weight loss: Secondary | ICD-10-CM

## 2012-06-05 DIAGNOSIS — J441 Chronic obstructive pulmonary disease with (acute) exacerbation: Principal | ICD-10-CM | POA: Diagnosis present

## 2012-06-05 DIAGNOSIS — IMO0002 Reserved for concepts with insufficient information to code with codable children: Secondary | ICD-10-CM

## 2012-06-05 DIAGNOSIS — T50905A Adverse effect of unspecified drugs, medicaments and biological substances, initial encounter: Secondary | ICD-10-CM

## 2012-06-05 DIAGNOSIS — J449 Chronic obstructive pulmonary disease, unspecified: Secondary | ICD-10-CM

## 2012-06-05 DIAGNOSIS — D72829 Elevated white blood cell count, unspecified: Secondary | ICD-10-CM | POA: Diagnosis present

## 2012-06-05 LAB — BASIC METABOLIC PANEL
BUN: 23 mg/dL (ref 6–23)
CO2: 21 mEq/L (ref 19–32)
Calcium: 9.3 mg/dL (ref 8.4–10.5)
Chloride: 99 mEq/L (ref 96–112)
Creatinine, Ser: 0.94 mg/dL (ref 0.50–1.35)
GFR calc Af Amer: >90 mL/min (ref >90)
GFR calc non Af Amer: 85 mL/min — ABNORMAL LOW (ref >90)
Glucose, Bld: 110 mg/dL — ABNORMAL HIGH (ref 70–99)
Potassium: 3.9 mEq/L (ref 3.5–5.1)
Sodium: 134 mEq/L — ABNORMAL LOW (ref 135–145)

## 2012-06-05 LAB — CBC
HCT: 41.7 % (ref 39.0–52.0)
Hemoglobin: 14.3 g/dL (ref 13.0–17.0)
MCH: 31.2 pg (ref 26.0–34.0)
MCHC: 34.3 g/dL (ref 30.0–36.0)
MCV: 90.8 fL (ref 78.0–100.0)
Platelets: 282 10*3/uL (ref 150–400)
RBC: 4.59 MIL/uL (ref 4.22–5.81)
RDW: 15.4 % (ref 11.5–15.5)
WBC: 17.7 10*3/uL — ABNORMAL HIGH (ref 4.0–10.5)

## 2012-06-05 LAB — PRO B NATRIURETIC PEPTIDE: Pro B Natriuretic peptide (BNP): 161.5 pg/mL — ABNORMAL HIGH (ref 0–125)

## 2012-06-05 MED ORDER — MOMETASONE FURO-FORMOTEROL FUM 100-5 MCG/ACT IN AERO
2.0000 | INHALATION_SPRAY | Freq: Two times a day (BID) | RESPIRATORY_TRACT | Status: DC
  Administered 2012-06-06: 2 via RESPIRATORY_TRACT
  Filled 2012-06-05 (×2): qty 8.8

## 2012-06-05 MED ORDER — PREDNISONE 50 MG PO TABS
60.0000 mg | ORAL_TABLET | Freq: Once | ORAL | Status: AC
  Administered 2012-06-05: 60 mg via ORAL
  Filled 2012-06-05: qty 1

## 2012-06-05 MED ORDER — TAMSULOSIN HCL 0.4 MG PO CAPS
0.4000 mg | ORAL_CAPSULE | Freq: Every evening | ORAL | Status: DC
  Administered 2012-06-06: 0.4 mg via ORAL
  Filled 2012-06-05 (×2): qty 1

## 2012-06-05 MED ORDER — ALBUTEROL SULFATE (5 MG/ML) 0.5% IN NEBU: 2.5000 mg | INHALATION_SOLUTION | RESPIRATORY_TRACT | Status: DC | PRN

## 2012-06-05 MED ORDER — LEVOFLOXACIN IN D5W 750 MG/150ML IV SOLN
750.0000 mg | Freq: Once | INTRAVENOUS | Status: AC
  Administered 2012-06-05: 750 mg via INTRAVENOUS
  Filled 2012-06-05: qty 150

## 2012-06-05 MED ORDER — ONDANSETRON HCL 4 MG PO TABS: 4.0000 mg | ORAL_TABLET | Freq: Four times a day (QID) | ORAL | Status: DC | PRN

## 2012-06-05 MED ORDER — METHYLPREDNISOLONE SODIUM SUCC 125 MG IJ SOLR
80.0000 mg | Freq: Four times a day (QID) | INTRAMUSCULAR | Status: DC
  Administered 2012-06-06 – 2012-06-07 (×7): 80 mg via INTRAVENOUS
  Filled 2012-06-05 (×7): qty 2

## 2012-06-05 MED ORDER — IPRATROPIUM BROMIDE 0.02 % IN SOLN
0.5000 mg | Freq: Once | RESPIRATORY_TRACT | Status: AC
  Administered 2012-06-05: 0.5 mg via RESPIRATORY_TRACT
  Filled 2012-06-05: qty 2.5

## 2012-06-05 MED ORDER — ALBUTEROL SULFATE HFA 108 (90 BASE) MCG/ACT IN AERS: 2.0000 | INHALATION_SPRAY | Freq: Four times a day (QID) | RESPIRATORY_TRACT | Status: DC | PRN

## 2012-06-05 MED ORDER — ONDANSETRON HCL 4 MG/2ML IJ SOLN: 4.0000 mg | Freq: Four times a day (QID) | INTRAMUSCULAR | Status: DC | PRN

## 2012-06-05 MED ORDER — HYDROCODONE-HOMATROPINE 5-1.5 MG/5ML PO SYRP: 5.0000 mL | ORAL_SOLUTION | Freq: Four times a day (QID) | ORAL | Status: DC | PRN

## 2012-06-05 MED ORDER — ALBUTEROL (5 MG/ML) CONTINUOUS INHALATION SOLN
10.0000 mg/h | INHALATION_SOLUTION | RESPIRATORY_TRACT | Status: DC
  Administered 2012-06-05: 10 mg/h via RESPIRATORY_TRACT
  Filled 2012-06-05: qty 20

## 2012-06-05 MED ORDER — CEFTRIAXONE SODIUM 1 G IJ SOLR
1.0000 g | INTRAMUSCULAR | Status: DC
  Administered 2012-06-06 (×2): 1 g via INTRAVENOUS
  Filled 2012-06-05 (×3): qty 10

## 2012-06-05 MED ORDER — AZITHROMYCIN 250 MG PO TABS
500.0000 mg | ORAL_TABLET | Freq: Every day | ORAL | Status: DC
  Administered 2012-06-06 – 2012-06-07 (×2): 500 mg via ORAL
  Filled 2012-06-05 (×2): qty 2

## 2012-06-05 MED ORDER — HEPARIN SODIUM (PORCINE) 5000 UNIT/ML IJ SOLN: 5000.0000 [IU] | Freq: Three times a day (TID) | INTRAMUSCULAR | Status: DC

## 2012-06-05 NOTE — ED Provider Notes (Signed)
History     CSN: [id number]  Arrival date & time 06/05/12  4098   First MD Initiated Contact with Patient 06/05/12 2019      Chief Complaint  Patient presents with  . Cough  . Headache  . Shortness of Breath    (Consider location/radiation/quality/duration/timing/severity/associated sxs/prior treatment) Patient is a 67 y.o. male presenting with cough, headaches, and shortness of breath. The history is provided by the patient and the spouse. No language interpreter was used.  Cough Cough characteristics:  Productive Sputum characteristics:  Nondescript Severity:  Mild Onset quality:  Gradual Timing:  Intermittent Progression:  Unchanged Chronicity:  New Smoker: yes   Context: sick contacts, smoke exposure, upper respiratory infection and with activity   Relieved by:  Nothing Worsened by:  Activity Ineffective treatments:  Beta-agonist inhaler and steroid inhaler Associated symptoms: chills, headaches, shortness of breath, sinus congestion, sore throat, weight loss and wheezing   Shortness of breath:    Severity:  Moderate   Onset quality:  Gradual   Timing:  Constant   Progression:  Worsening Weight loss:    Amount:  More than 10 kg (reports 25lbs in past 3-19mo, unintentional ) Headache Associated symptoms: cough, nausea, sore throat and vomiting   Shortness of Breath Associated symptoms: cough, headaches, sore throat, vomiting and wheezing    Pt is a 67yo male with COPD presenting today after a 5 day hx of worsening SOB.  States his home breathing tx are not helping today.   States he has generalized weakness and feels lightheaded when he stands up.  Admits to decreased oral intake, and decreased appetite for past few months.  Has noticed  25lb weight loss in past 3-40mo, unintentional.    Past Medical History  Diagnosis Date  . HTN (hypertension)   . Cigarette smoker   . COPD (chronic obstructive pulmonary disease)     History reviewed. No pertinent past surgical  history.  Family History  Problem Relation Age of Onset  . Atopy Neg Hx   . Heart disease Mother     History  Substance Use Topics  . Smoking status: Current Every Day Smoker -- 1.00 packs/day for 30 years    Types: Cigarettes    Last Attempt to Quit: 08/08/2010  . Smokeless tobacco: Never Used  . Alcohol Use: No      Review of Systems  Constitutional: Positive for chills and weight loss.  HENT: Positive for sore throat.   Respiratory: Positive for cough, shortness of breath and wheezing.   Gastrointestinal: Positive for nausea and vomiting.  Neurological: Positive for headaches.  All other systems reviewed and are negative.    Allergies  Review of patient's allergies indicates no known allergies.  Home Medications   Current Outpatient Rx  Name  Route  Sig  Dispense  Refill  . ADVAIR DISKUS 250-50 MCG/DOSE AEPB      INHALE ONE PUFF BY MOUTH TWICE DAILY   60 each   3   . albuterol (PROAIR HFA) 108 (90 BASE) MCG/ACT inhaler   Inhalation   Inhale 2 puffs into the lungs every 6 (six) hours as needed for wheezing or shortness of breath.         Marland Kitchen albuterol (PROVENTIL) (2.5 MG/3ML) 0.083% nebulizer solution   Nebulization   Take 2.5 mg by nebulization every 6 (six) hours as needed for wheezing or shortness of breath.         Marland Kitchen HYDROcodone-homatropine (HYCODAN) 5-1.5 MG/5ML syrup   Oral  Take 5 mLs by mouth every 6 (six) hours as needed for cough.         . naproxen sodium (ALEVE) 220 MG tablet   Oral   Take 440 mg by mouth daily as needed (for pain).         . tamsulosin (FLOMAX) 0.4 MG CAPS   Oral   Take 0.4 mg by mouth every evening.           BP 104/77  Pulse 127  Temp(Src) 98.5 F (36.9 C) (Oral)  Resp 22  Ht 5\' 8"  (1.727 m)  Wt 158 lb (71.668 kg)  BMI 24.03 kg/m2  SpO2 97%  Physical Exam  Vitals reviewed. Constitutional: He is oriented to person, place, and time.  HENT:  Head: Normocephalic and atraumatic.  Eyes: Conjunctivae  and EOM are normal. Pupils are equal, round, and reactive to light.  Neck: Normal range of motion. Neck supple. No JVD present.  Cardiovascular: Regular rhythm and normal heart sounds.   Tachycardic. No peripheral edema   Pulmonary/Chest: He is in respiratory distress. He has wheezes. He has rales. He exhibits no tenderness.  Diffuse wheezing and rales throughout all lung fields, decreased lung sounds in LLL  Abdominal: Soft. Bowel sounds are normal. He exhibits no distension and no mass. There is no tenderness. There is no rebound and no guarding.  Musculoskeletal: Normal range of motion.  Neurological: He is alert and oriented to person, place, and time.  Skin: Skin is warm and dry. No rash noted. No erythema. No pallor.    ED Course  Procedures (including critical care time)  Labs Reviewed  BASIC METABOLIC PANEL - Abnormal; Notable for the following:    Sodium 134 (*)    Glucose, Bld 110 (*)    GFR calc non Af Amer 85 (*)    All other components within normal limits  CBC - Abnormal; Notable for the following:    WBC 17.7 (*)    All other components within normal limits  PRO B NATRIURETIC PEPTIDE - Abnormal; Notable for the following:    Pro B Natriuretic peptide (BNP) 161.5 (*)    All other components within normal limits  TROPONIN I   Dg Chest 2 View  06/05/2012  *RADIOLOGY REPORT*  Clinical Data: Cough and short of breath  CHEST - 2 VIEW  Comparison: 12/23/2008  Findings: Mild bibasilar airspace disease, most consistent with atelectasis.  Pneumonia not completely excluded.  This was not present previously.  Negative for heart failure or effusion.  IMPRESSION: Mild bibasilar airspace disease which may represent atelectasis or pneumonia   Original Report Authenticated By: Janeece Riggers, M.D.      Date: 06/05/2012  Rate: 122  Rhythm: sinus tachycardia  QRS Axis: normal  Intervals: normal  ST/T Wave abnormalities: normal  Conduction Disutrbances:none  Narrative Interpretation:    Old EKG Reviewed: none available   1. Community acquired pneumonia   2. COPD (chronic obstructive pulmonary disease)       MDM  Pt is a 67yo male with COPD presents with increased worsening of SOB for past week.  Home breathing tx have not helped today.  Denies fever but states he has felt warm.  Reports 25lb weight loss over past 3-14mo, unintentional.  Pt is a smoker. Denies hx of CHF, diabetes, or cancer.     EKG: appears normal, no indication of ventricular hypertrophy or electrolyte abnormalities   CXR indicates mild bibasilar pneumonia.   21:25 Will start IV Levofloxacin 750mg .  Pt still SOB on continuous neb tx.  BP 104/77  Pulse 127  Temp(Src) 98.5 F (36.9 C) (Oral)  Resp 22  Ht 5\' 8"  (1.727 m)  Wt 158 lb (71.668 kg)  BMI 24.03 kg/m2  SpO2 97% Will be admitting pt.    Labs: Troponin: <0.30  CBC-leukocytosis; elevated BNP-mildly elevated, not indicative of CHF.  Hospitalist to admit pt.    Discussed pt with Dr. Eber Hong throughout pt stay in ED.  Agreed with tx plan.           Junius Finner, PA-C 06/05/12 2245

## 2012-06-05 NOTE — ED Provider Notes (Signed)
Medical screening examination/treatment/procedure(s) were conducted as a shared visit with non-physician practitioner(s) and myself.  I personally evaluated the patient during the encounter  Please see my separate respective documentation pertaining to this patient encounter   Vida Roller, MD 06/05/12 2245

## 2012-06-05 NOTE — ED Provider Notes (Signed)
67 year old male with a history of COPD and hypertension presents with a complaint of increased shortness of breath. This is gradually getting worse, associated with a cough and generalized weakness, the patient states he has increased difficulty standing up because of lightheadedness and a feeling of near syncope. He has had decreased oral intake, increased coughing and shortness of breath which does not seem to be getting that much better with his inhaler medications. He also endorses having a 30 pound weight loss over the last several months which is unintentional.  On exam the patient has diffuse mild respiratory wheezing, increased work of breathing with mild accessory muscle use, he has decreased breath sounds at the left base, soft abdomen, clear heart sounds, no peripheral edema or JVD. His mucous membranes are mildly dehydrated, no significant erythema asymmetry exudate of the pharynx.   chest x-ray laboratory work ordered we'll need to evaluate for lung cancer, pneumonia, EKG to evaluate cardiac function and electrolytes abnormalities, albuterol continuous treatment ordered for the patient's increased work of breathing and COPD. Doubt cardiac process as the EKG is normal and the patient does not have a history of heart disease.    Medical screening examination/treatment/procedure(s) were conducted as a shared visit with non-physician practitioner(s) and myself.  I personally evaluated the patient during the encounter    Vida Roller, MD 06/05/12 2049

## 2012-06-05 NOTE — ED Notes (Signed)
Patient c/o cough, headache, shortness of breath and weakness x 3 days.  Respirations even and unlabored; able to speak in complete sentences without difficulty.

## 2012-06-05 NOTE — H&P (Signed)
[facility] History and Physical  BROEDY OSBOURNE ZOX:[id number] DOB: 02/02/1946 DOA: 06/05/2012  Referring physician: Dr. Lynelle Doctor, ER physician. PCP: No primary provider on file.    Chief Complaint: Dyspnea, productive cough.  HPI: Devin Barton is a 67 y.o. male who gives a 2 week history of progressive dyspnea associated with productive cough of brownish sputum. He continues to be a smoker of one pack of cigarettes per day. He also does give a history of 15-20 pound weight loss in the last 6 months, unintentional. He denies any hemoptysis. He does not have any home oxygen but admits to being dyspneic with exertion even when he feels relatively well.   Review of Systems: Apart from history of present illness, other systems negative.  Past Medical History  Diagnosis Date  . HTN (hypertension)   . Cigarette smoker   . COPD (chronic obstructive pulmonary disease)    History reviewed. No pertinent past surgical history. Social History:  Married, lives with his wife. He  smokes cigarettes one pack a day. Does not drink alcohol.   No Known Allergies  Family History  Problem Relation Age of Onset  . Atopy Neg Hx   . Heart disease Mother       Prior to Admission medications   Medication Sig Start Date End Date Taking? Authorizing Provider  ADVAIR DISKUS 250-50 MCG/DOSE AEPB INHALE ONE PUFF BY MOUTH TWICE DAILY 09/20/11  Yes Nyoka Cowden, MD  albuterol (PROAIR HFA) 108 (90 BASE) MCG/ACT inhaler Inhale 2 puffs into the lungs every 6 (six) hours as needed for wheezing or shortness of breath.   Yes Historical Provider, MD  albuterol (PROVENTIL) (2.5 MG/3ML) 0.083% nebulizer solution Take 2.5 mg by nebulization every 6 (six) hours as needed for wheezing or shortness of breath.   Yes Historical Provider, MD  HYDROcodone-homatropine (HYCODAN) 5-1.5 MG/5ML syrup Take 5 mLs by mouth every 6 (six) hours as needed for cough.   Yes Historical Provider, MD  naproxen sodium (ALEVE) 220 MG  tablet Take 440 mg by mouth daily as needed (for pain).   Yes Historical Provider, MD  tamsulosin (FLOMAX) 0.4 MG CAPS Take 0.4 mg by mouth every evening.   Yes Historical Provider, MD   Physical Exam: Filed Vitals:   06/05/12 2133 06/05/12 2147 06/05/12 2200 06/05/12 2300  BP: 121/77  104/77 117/72  Pulse: 117  127   Temp:      TempSrc:      Resp:   22 20  Height:      Weight:      SpO2: 95% 97% 97%      General:  He looks somewhat cachectic. He does not appear to have increase work of breathing at rest.  Eyes: No pallor. No jaundice.  ENT: No abnormalities.  Neck: No lymphadenopathy to  Cardiovascular: Heart sounds are present with a sinus tachycardia at rest.  Respiratory: Lung fields show bilateral wheezing, relatively tight. No bronchial breathing, crackles  Abdomen: . Soft, nontender. No masses.  Skin: No rash.  Musculoskeletal: No major abnormalities.  Psychiatric: Appropriate affect.  Neurologic: Alert and orientated without any focal neurological signs to  Labs on Admission:  Basic Metabolic Panel:  Recent Labs Lab 06/05/12 2111  NA 134*  K 3.9  CL 99  CO2 21  GLUCOSE 110*  BUN 23  CREATININE 0.94  CALCIUM 9.3       CBC:  Recent Labs Lab 06/05/12 2111  WBC 17.7*  HGB 14.3  HCT 41.7  MCV  90.8  PLT 282   Cardiac Enzymes:  Recent Labs Lab 06/05/12 2111  TROPONINI <0.30    BNP (last 3 results)  Recent Labs  06/05/12 2111  PROBNP 161.5*      Radiological Exams on Admission: Dg Chest 2 View  06/05/2012  *RADIOLOGY REPORT*  Clinical Data: Cough and short of breath  CHEST - 2 VIEW  Comparison: 12/23/2008  Findings: Mild bibasilar airspace disease, most consistent with atelectasis.  Pneumonia not completely excluded.  This was not present previously.  Negative for heart failure or effusion.  IMPRESSION: Mild bibasilar airspace disease which may represent atelectasis or pneumonia   Original Report Authenticated By: Janeece Riggers,  M.D.       Assessment/Plan   1. Exacerbation of COPD. 2. Tobacco abuse. 3. Unintentional weight loss. 4. Hypertension.  Plan: 1. Admit to medical floor. 2. Intravenous steroids. Intravenous antibiotics. 3. Bronchodilators. 4. CT chest scan. Further recommendations will depend on patient's hospital progress .  Code Status: Full code.   Family Communication: Discussed with patient at the bedside.   Disposition Plan: Home in medically stable.   Time spent: 45 minutes.  Wilson Singer [facility] Pager [phone]  If 7PM-7AM, please contact night-coverage [email or web address] Password [facility] 06/05/2012, 11:26 PM

## 2012-06-06 ENCOUNTER — Encounter (HOSPITAL_COMMUNITY): Payer: Self-pay | Admitting: General Practice

## 2012-06-06 DIAGNOSIS — R634 Abnormal weight loss: Secondary | ICD-10-CM

## 2012-06-06 DIAGNOSIS — T50905A Adverse effect of unspecified drugs, medicaments and biological substances, initial encounter: Secondary | ICD-10-CM | POA: Diagnosis present

## 2012-06-06 DIAGNOSIS — T50904A Poisoning by unspecified drugs, medicaments and biological substances, undetermined, initial encounter: Secondary | ICD-10-CM

## 2012-06-06 DIAGNOSIS — R7309 Other abnormal glucose: Secondary | ICD-10-CM

## 2012-06-06 DIAGNOSIS — J441 Chronic obstructive pulmonary disease with (acute) exacerbation: Principal | ICD-10-CM | POA: Diagnosis present

## 2012-06-06 DIAGNOSIS — R739 Hyperglycemia, unspecified: Secondary | ICD-10-CM | POA: Diagnosis present

## 2012-06-06 LAB — COMPREHENSIVE METABOLIC PANEL
AST: 8 U/L (ref 0–37)
Alkaline Phosphatase: 89 U/L (ref 39–117)
BUN: 20 mg/dL (ref 6–23)
CO2: 23 mEq/L (ref 19–32)
Chloride: 99 mEq/L (ref 96–112)
Creatinine, Ser: 0.8 mg/dL (ref 0.50–1.35)
GFR calc non Af Amer: >90 mL/min (ref >90)
Potassium: 4.1 mEq/L (ref 3.5–5.1)
Total Bilirubin: 0.3 mg/dL (ref 0.3–1.2)

## 2012-06-06 LAB — GLUCOSE, CAPILLARY
Glucose-Capillary: 190 mg/dL — ABNORMAL HIGH (ref 70–99)
Glucose-Capillary: 146 mg/dL — ABNORMAL HIGH (ref 70–99)

## 2012-06-06 LAB — CBC
MCH: 30.6 pg (ref 26.0–34.0)
Platelets: 304 10*3/uL (ref 150–400)
RBC: 4.54 MIL/uL (ref 4.22–5.81)
RDW: 15.6 % — ABNORMAL HIGH (ref 11.5–15.5)

## 2012-06-06 LAB — TSH: TSH: 0.794 u[IU]/mL (ref 0.350–4.500)

## 2012-06-06 MED ORDER — INSULIN ASPART 100 UNIT/ML ~~LOC~~ SOLN
0.0000 [IU] | Freq: Three times a day (TID) | SUBCUTANEOUS | Status: DC
  Administered 2012-06-06: 4 [IU] via SUBCUTANEOUS
  Administered 2012-06-07: 3 [IU] via SUBCUTANEOUS
  Administered 2012-06-07: 4 [IU] via SUBCUTANEOUS

## 2012-06-06 MED ORDER — ALBUTEROL SULFATE (5 MG/ML) 0.5% IN NEBU
2.5000 mg | INHALATION_SOLUTION | Freq: Four times a day (QID) | RESPIRATORY_TRACT | Status: DC
  Administered 2012-06-06 – 2012-06-07 (×3): 2.5 mg via RESPIRATORY_TRACT
  Filled 2012-06-06 (×5): qty 0.5

## 2012-06-06 MED ORDER — BIOTENE DRY MOUTH MT LIQD
15.0000 mL | Freq: Two times a day (BID) | OROMUCOSAL | Status: DC
  Administered 2012-06-06 – 2012-06-07 (×3): 15 mL via OROMUCOSAL

## 2012-06-06 MED ORDER — INSULIN GLARGINE 100 UNIT/ML ~~LOC~~ SOLN
15.0000 [IU] | Freq: Every day | SUBCUTANEOUS | Status: DC
  Administered 2012-06-06: 15 [IU] via SUBCUTANEOUS

## 2012-06-06 MED ORDER — DEXTROSE 5 % IV SOLN
INTRAVENOUS | Status: AC
  Filled 2012-06-06: qty 10

## 2012-06-06 MED ORDER — IPRATROPIUM BROMIDE 0.02 % IN SOLN
0.5000 mg | Freq: Four times a day (QID) | RESPIRATORY_TRACT | Status: DC
  Administered 2012-06-06 – 2012-06-07 (×3): 0.5 mg via RESPIRATORY_TRACT
  Filled 2012-06-06 (×5): qty 2.5

## 2012-06-06 MED ORDER — INSULIN ASPART 100 UNIT/ML ~~LOC~~ SOLN: 0.0000 [IU] | Freq: Every day | SUBCUTANEOUS | Status: DC

## 2012-06-06 MED ORDER — HEPARIN SODIUM (PORCINE) 5000 UNIT/ML IJ SOLN
5000.0000 [IU] | Freq: Three times a day (TID) | INTRAMUSCULAR | Status: DC
  Administered 2012-06-06 – 2012-06-07 (×4): 5000 [IU] via SUBCUTANEOUS
  Filled 2012-06-06 (×4): qty 1

## 2012-06-06 MED ORDER — ENSURE COMPLETE PO LIQD
237.0000 mL | Freq: Two times a day (BID) | ORAL | Status: DC
  Administered 2012-06-06 – 2012-06-07 (×2): 237 mL via ORAL

## 2012-06-06 NOTE — Progress Notes (Signed)
Spoke with the patient and his wife about the order for the CT scan of his test I asked him if the MD had said anything to him about why the test was ordered.  He stated that he had not.  I voiced to him that the reason the reason that the test was ordered because of him loosing so much weight  Over a short period of time.  I voiced to him that Dr. Sherrie Mustache stated due to his current issues and the fact of the weight lost the insurance will most likely cover the procedure.  He still declines the procedure.  I notified Dr. Sherrie Mustache of this via text.

## 2012-06-06 NOTE — Care Management Note (Unsigned)
    Page 1 of 1   06/06/2012     2:42:21 PM   CARE MANAGEMENT NOTE 06/06/2012  Patient:  Devin Barton, Devin Barton   Account Number:  [id number]  Date Initiated:  06/06/2012  Documentation initiated by:  Rosemary Holms  Subjective/Objective Assessment:   Pt admitted from home where he lives with his wife. States he has COPD but states he does not have home O2 nor does he need it. Declined HH services.     Action/Plan:   Anticipated DC Date:  06/07/2012   Anticipated DC Plan:  HOME/SELF CARE      DC Planning Services  CM consult      Choice offered to / List presented to:             Status of service:  In process, will continue to follow Medicare Important Message given?   (If response is "NO", the following Medicare IM given date fields will be blank) Date Medicare IM given:   Date Additional Medicare IM given:    Discharge Disposition:    Per UR Regulation:    If discussed at Long Length of Stay Meetings, dates discussed:    Comments:  06/06/12 1100 Amy Leanord Hawking RN BSN CM

## 2012-06-06 NOTE — ED Notes (Signed)
Attempted to call report, but nurse is unable to take it at this time.

## 2012-06-06 NOTE — Progress Notes (Signed)
UR Chart Review Completed  

## 2012-06-06 NOTE — Progress Notes (Signed)
Subjective: The patient says that he is breathing a little better. He refuses CT scan of his chest because "my insurance won't pay for it". He denies difficulty chewing, difficulty swallowing, or abdominal pain. He says his appetite has been fairly good.  Objective: Vital signs in last 24 hours: Filed Vitals:   06/06/12 0044 06/06/12 0600 06/06/12 1254 06/06/12 1437  BP: 142/77 133/80 128/73   Pulse: 87 65 63   Temp: 97.4 F (36.3 C) 97.5 F (36.4 C) 97.8 F (36.6 C)   TempSrc:  Oral Oral   Resp: 18 19 18    Height: 5\' 3"  (1.6 m)     Weight: 62.869 kg (138 lb 9.6 oz)     SpO2: 96% 93% 93% 93%    Intake/Output Summary (Last 24 hours) at 06/06/12 1508 Last data filed at 06/06/12 1249  Gross per 24 hour  Intake    750 ml  Output      3 ml  Net    747 ml    Weight change:   Physical exam:  General: Alert 67 year old Caucasian man laying in bed, in no acute distress. Lungs: Mild diffuse wheezes. Breathing is nonlabored at rest. Heart: S1, S2, with a soft systolic murmur. Abdomen: Positive bowel sounds, soft, nontender, nondistended. Extremities: No pedal edema.   Lab Results: Basic Metabolic Panel:  Recent Labs  16/10/96 2111 06/06/12 0507  NA 134* 135  K 3.9 4.1  CL 99 99  CO2 21 23  GLUCOSE 110* 175*  BUN 23 20  CREATININE 0.94 0.80  CALCIUM 9.3 9.6   Liver Function Tests:  Recent Labs  06/06/12 0507  AST 8  ALT 8  ALKPHOS 89  BILITOT 0.3  PROT 6.9  ALBUMIN 3.4*   No results found for this basename: LIPASE, AMYLASE,  in the last 72 hours No results found for this basename: AMMONIA,  in the last 72 hours CBC:  Recent Labs  06/05/12 2111 06/06/12 0507  WBC 17.7* 20.8*  HGB 14.3 13.9  HCT 41.7 41.5  MCV 90.8 91.4  PLT 282 304   Cardiac Enzymes:  Recent Labs  06/05/12 2111  TROPONINI <0.30   BNP:  Recent Labs  06/05/12 2111  PROBNP 161.5*   D-Dimer: No results found for this basename: DDIMER,  in the last 72 hours CBG: No  results found for this basename: GLUCAP,  in the last 72 hours Hemoglobin A1C: No results found for this basename: HGBA1C,  in the last 72 hours Fasting Lipid Panel: No results found for this basename: CHOL, HDL, LDLCALC, TRIG, CHOLHDL, LDLDIRECT,  in the last 72 hours Thyroid Function Tests: No results found for this basename: TSH, T4TOTAL, FREET4, T3FREE, THYROIDAB,  in the last 72 hours Anemia Panel: No results found for this basename: VITAMINB12, FOLATE, FERRITIN, TIBC, IRON, RETICCTPCT,  in the last 72 hours Coagulation: No results found for this basename: LABPROT, INR,  in the last 72 hours Urine Drug Screen: Drugs of Abuse  No results found for this basename: labopia,  cocainscrnur,  labbenz,  amphetmu,  thcu,  labbarb    Alcohol Level: No results found for this basename: ETH,  in the last 72 hours Urinalysis: No results found for this basename: COLORURINE, APPERANCEUR, LABSPEC, PHURINE, GLUCOSEU, HGBUR, BILIRUBINUR, KETONESUR, PROTEINUR, UROBILINOGEN, NITRITE, LEUKOCYTESUR,  in the last 72 hours Misc. Labs:   Micro: No results found for this or any previous visit (from the past 240 hour(s)).  Studies/Results: Dg Chest 2 View  06/05/2012  *RADIOLOGY REPORT*  Clinical Data: Cough and short of breath  CHEST - 2 VIEW  Comparison: 12/23/2008  Findings: Mild bibasilar airspace disease, most consistent with atelectasis.  Pneumonia not completely excluded.  This was not present previously.  Negative for heart failure or effusion.  IMPRESSION: Mild bibasilar airspace disease which may represent atelectasis or pneumonia   Original Report Authenticated By: Janeece Riggers, M.D.     Medications:  Scheduled: . albuterol  2.5 mg Nebulization Q6H  . antiseptic oral rinse  15 mL Mouth Rinse BID  . azithromycin  500 mg Oral Daily  . cefTRIAXone (ROCEPHIN)  IV  1 g Intravenous Q24H  . feeding supplement  237 mL Oral BID BM  . heparin  5,000 Units Subcutaneous Q8H  . ipratropium  0.5 mg  Nebulization Q6H  . methylPREDNISolone (SOLU-MEDROL) injection  80 mg Intravenous Q6H  . mometasone-formoterol  2 puff Inhalation BID  . tamsulosin  0.4 mg Oral QPM   Continuous:  WUJ:WJXBJYNWG, HYDROcodone-homatropine, ondansetron (ZOFRAN) IV, ondansetron  Assessment: Principal Problem:   COPD exacerbation Active Problems:   CIGARETTE SMOKER   HYPERTENSION, BENIGN   Unintentional weight loss   1. COPD with exacerbation. We'll continue IV Solu-Medrol, bronchodilators, and antibiotic therapy with azithromycin and Rocephin.  Tobacco abuse/cigarette smoker. The patient was advised to stop smoking. Continue nicotine replacement therapy.  Hypertension. Currently stable and controlled.  Unintentional weight loss. The patient refused CT scan of his chest because he believes that his insurance will not pay for it. I believe that the patient does not want to know what the potential results could be.  Hyperglycemia. Presumed to be steroid induced.  Leukocytosis. Likely secondary to steroids.  Plan:  1. Will increase the frequency of albuterol/Atrovent nebulizers to every 4 hours. 2. We'll at sliding scale NovoLog for treatment of steroid-induced hyperglycemia. 3. Tobacco cessation counseling. 4. We'll check a TSH and hemoglobin A1c. 5. If the patient is clinically improved tomorrow, consider discharge to home.   LOS: 1 day   FISHER,DENISE 06/06/2012, 3:08 PM

## 2012-06-06 NOTE — ED Notes (Signed)
Pt refused to have CT of chest done. Pt states his insurance will not pay for it and he is not having it done.

## 2012-06-06 NOTE — Progress Notes (Signed)
INITIAL NUTRITION ASSESSMENT  DOCUMENTATION CODES Per approved criteria  -Non-severe (moderate) malnutrition in the context of chronic illness   INTERVENTION: Ensure Complete po BID, each supplement provides 350 kcal and 13 grams of protein.  NUTRITION DIAGNOSIS: Malnutrition related to inadequate oral intake AEB >10% wt loss in 6 months and moderate depletion of body fat and muscle mass.  Goal: Pt to meet >/= 90% of their estimated nutrition needs  Monitor:  Meals, supplements,nutritional adequacy, labs and wt trends  Reason for Assessment: Malnutrition Screen  67 y.o. male  Admitting Dx: Progressive dyspnea  ASSESSMENT: Pt is smoker and has progressive dyspnea. He denies changes in appetite or po intake but has experienced significant involuntary wt loss 15-20# (12%) in past 6 months. He is refusing the CT scan which was to further evaluate etiology. At least part of his wt loss may be related to his increased energy requirements due to dyspnea. He reports regular meal intake bacon or sausage eggs for breakfast sandwich for lunch and hot meal in the evening. His wife says that he was also briefly taking MVI and drinking supplement at home but not consistently. Pt meets criteria for moderate malnutrition in the context of chronic illness given his significant wt loss and mild to moderate depletion of muscle and fat.  Height: Ht Readings from Last 1 Encounters:  06/06/12 5\' 3"  (1.6 m)    Weight: Wt Readings from Last 1 Encounters:  06/06/12 138 lb 9.6 oz (62.869 kg)    Ideal Body Weight: 124# (56.3 kg)  % Ideal Body Weight: 112%  Wt Readings from Last 10 Encounters:  06/06/12 138 lb 9.6 oz (62.869 kg)  09/13/10 157 lb (71.215 kg)  11/03/07 167 lb 6.1 oz (75.924 kg)  10/01/07 158 lb (71.668 kg)  09/02/07 161 lb 8 oz (73.256 kg)  08/15/07 158 lb (71.668 kg)  07/22/07 157 lb 6.1 oz (71.388 kg)    Usual Body Weight: 157# (71.3 kg)  % Usual Body Weight: 88%  BMI:   Body mass index is 24.56 kg/(m^2). Normal range  Estimated Nutritional Needs: Kcal: 8657-8469  Protein: 95 gr  Fluid:> 2000 ml/day   Skin: abrasions  Diet Order: General  EDUCATION NEEDS: -Education not appropriate at this time   Intake/Output Summary (Last 24 hours) at 06/06/12 0947 Last data filed at 06/06/12 0857  Gross per 24 hour  Intake    510 ml  Output      1 ml  Net    509 ml    Last BM: 06/05/12   Labs:   Recent Labs Lab 06/05/12 2111 06/06/12 0507  NA 134* 135  K 3.9 4.1  CL 99 99  CO2 21 23  BUN 23 20  CREATININE 0.94 0.80  CALCIUM 9.3 9.6  GLUCOSE 110* 175*    CBG (last 3)  No results found for this basename: GLUCAP,  in the last 72 hours  Scheduled Meds: . antiseptic oral rinse  15 mL Mouth Rinse BID  . azithromycin  500 mg Oral Daily  . cefTRIAXone (ROCEPHIN)  IV  1 g Intravenous Q24H  . heparin  5,000 Units Subcutaneous Q8H  . methylPREDNISolone (SOLU-MEDROL) injection  80 mg Intravenous Q6H  . mometasone-formoterol  2 puff Inhalation BID  . tamsulosin  0.4 mg Oral QPM    Continuous Infusions:   Past Medical History  Diagnosis Date  . HTN (hypertension)   . Cigarette smoker   . COPD (chronic obstructive pulmonary disease)     Past Surgical  History  Procedure Laterality Date  . Hernia repair      3 different times    Royann Shivers MS,RD,LDN,CSG Office: #[phone] Pager: [phone]

## 2012-06-07 ENCOUNTER — Inpatient Hospital Stay (HOSPITAL_COMMUNITY): Payer: Medicare Other

## 2012-06-07 LAB — GLUCOSE, CAPILLARY: Glucose-Capillary: 161 mg/dL — ABNORMAL HIGH (ref 70–99)

## 2012-06-07 MED ORDER — IPRATROPIUM BROMIDE 0.02 % IN SOLN: 0.5000 mg | RESPIRATORY_TRACT | Status: DC | PRN

## 2012-06-07 MED ORDER — IOHEXOL 300 MG/ML  SOLN
80.0000 mL | Freq: Once | INTRAMUSCULAR | Status: AC | PRN
  Administered 2012-06-07: 80 mL via INTRAVENOUS

## 2012-06-07 MED ORDER — ALBUTEROL SULFATE (5 MG/ML) 0.5% IN NEBU: 2.5000 mg | INHALATION_SOLUTION | RESPIRATORY_TRACT | Status: DC | PRN

## 2012-06-07 MED ORDER — PREDNISONE 10 MG PO TABS: ORAL_TABLET | ORAL | Status: DC

## 2012-06-07 MED ORDER — LEVOFLOXACIN 750 MG PO TABS: 750.0000 mg | ORAL_TABLET | Freq: Every day | ORAL | Status: DC

## 2012-06-07 MED ORDER — NICOTINE POLACRILEX 2 MG MT GUM: 2.0000 mg | CHEWING_GUM | OROMUCOSAL | Status: DC | PRN

## 2012-06-07 NOTE — Discharge Summary (Addendum)
Physician Discharge Summary  OAKLAN PERSONS NWG:[id number] DOB: Aug 28, 1945 DOA: 06/05/2012  PCP: No primary provider on file.  Admit date: 06/05/2012 Discharge date: 06/07/2012  Time spent: 40 minutes  Recommendations for Outpatient Follow-up:  1. Followup with primary care doctor in 2 weeks.  Discharge Diagnoses:  Principal Problem:   COPD exacerbation Active Problems:   CIGARETTE SMOKER   HYPERTENSION, BENIGN   Unintentional weight loss   Hyperglycemia, drug-induced Moderate Malnutrition  Discharge Condition: Improved  Diet recommendation: Low salt  Filed Weights   06/05/12 2012 06/06/12 0044 06/07/12 0540  Weight: 71.668 kg (158 lb) 62.869 kg (138 lb 9.6 oz) 63.5 kg (139 lb 15.9 oz)    History of present illness:  Devin Barton is a 67 y.o. male who gives a 2 week history of progressive dyspnea associated with productive cough of brownish sputum. He continues to be a smoker of one pack of cigarettes per day. He also does give a history of 15-20 pound weight loss in the last 6 months, unintentional. He denies any hemoptysis. He does not have any home oxygen but admits to being dyspneic with exertion even when he feels relatively well.   Hospital Course:  This gentleman was admitted to the hospital for shortness of breath. He has known COPD. He was admitted to the hospital for treatment of COPD exacerbation. He was started on steroids, antibiotics and nebulizer treatments. He quickly improved and returned to baseline. He is now ambulating without any oxygen and does not feel short of breath. He did report unintentional weight loss. With his history of tobacco abuse, CT of the chest was done to rule out any underlying malignancy. Fortunately this did not show any suspicious lesions. His weight loss can be further investigated by his primary care physician. He was strongly advised to quit smoking and was given a prescription for nicotine gum. Patient was felt stable for discharge  since he is back to baseline.  Procedures:  None  Consultations:  None  Discharge Exam: Filed Vitals:   06/06/12 2144 06/07/12 0540 06/07/12 0742 06/07/12 1420  BP: 122/70 122/71  125/65  Pulse: 72 55  71  Temp: 97.9 F (36.6 C) 97.9 F (36.6 C)  97.5 F (36.4 C)  TempSrc: Oral Oral  Oral  Resp: 20 20  18   Height:      Weight:  63.5 kg (139 lb 15.9 oz)    SpO2: 93% 93% 93% 92%    General: No acute distress Cardiovascular: S1, S2, regular rate and rhythm Respiratory: Diminished breath sounds with mild wheeze bilaterally  Discharge Instructions  Discharge Orders   Future Orders Complete By Expires     Call MD for:  difficulty breathing, headache or visual disturbances  As directed     Call MD for:  temperature >100.4  As directed     Diet - low sodium heart healthy  As directed     Increase activity slowly  As directed         Medication List    TAKE these medications       ADVAIR DISKUS 250-50 MCG/DOSE Aepb  Generic drug:  Fluticasone-Salmeterol  INHALE ONE PUFF BY MOUTH TWICE DAILY     ALEVE 220 MG tablet  Generic drug:  naproxen sodium  Take 440 mg by mouth daily as needed (for pain).     HYDROcodone-homatropine 5-1.5 MG/5ML syrup  Commonly known as:  HYCODAN  Take 5 mLs by mouth every 6 (six) hours as needed for cough.  levofloxacin 750 MG tablet  Commonly known as:  LEVAQUIN  Take 1 tablet (750 mg total) by mouth daily.     nicotine polacrilex 2 MG gum  Commonly known as:  NICORETTE  Take 1 each (2 mg total) by mouth every 2 (two) hours as needed for smoking cessation.     predniSONE 10 MG tablet  Commonly known as:  DELTASONE  Take 40mg  po daily for 2 days then 30mg  po daily for 2 days then 20mg  po daily for 2 days then 10mg  po daily for 2 days then stop     PROAIR HFA 108 (90 BASE) MCG/ACT inhaler  Generic drug:  albuterol  Inhale 2 puffs into the lungs every 6 (six) hours as needed for wheezing or shortness of breath.     albuterol  (2.5 MG/3ML) 0.083% nebulizer solution  Commonly known as:  PROVENTIL  Take 2.5 mg by nebulization every 6 (six) hours as needed for wheezing or shortness of breath.     tamsulosin 0.4 MG Caps  Commonly known as:  FLOMAX  Take 0.4 mg by mouth every evening.           Follow-up Information   Follow up with SUAREZ,J JONATHAN, PA-C. Schedule an appointment as soon as possible for a visit in 2 weeks.   Contact information:   [address], [place] [address] [phone]        The results of significant diagnostics from this hospitalization (including imaging, microbiology, ancillary and laboratory) are listed below for reference.    Significant Diagnostic Studies: Dg Chest 2 View  06/05/2012  *RADIOLOGY REPORT*  Clinical Data: Cough and short of breath  CHEST - 2 VIEW  Comparison: 12/23/2008  Findings: Mild bibasilar airspace disease, most consistent with atelectasis.  Pneumonia not completely excluded.  This was not present previously.  Negative for heart failure or effusion.  IMPRESSION: Mild bibasilar airspace disease which may represent atelectasis or pneumonia   Original Report Authenticated By: Janeece Riggers, M.D.    Ct Chest W Contrast  06/07/2012  *RADIOLOGY REPORT*  Clinical Data: Shortness of breath.  Weight loss.  CT CHEST WITH CONTRAST  Technique:  Multidetector CT imaging of the chest was performed following the standard protocol during bolus administration of intravenous contrast.  Contrast: 80mL OMNIPAQUE IOHEXOL 300 MG/ML  SOLN  Comparison: None.  Findings: No evidence of mediastinal or hilar masses.  No lymphadenopathy identified within the thorax.  No evidence of chest wall mass or suspicious bone lesions.  Mild scarring or atelectasis is seen in both lower lobes.  No evidence of pulmonary air space disease.  No suspicious pulmonary nodules or masses are identified.  There is no evidence of central endobronchial lesion.  No evidence of pleural or pericardial  effusion.  Both adrenal glands are normal appearance.  IMPRESSION:  1.  Mild bilateral lower lobe scarring versus atelectasis. 2.  No evidence of mass, lymphadenopathy, or other significant abnormality.   Original Report Authenticated By: Myles Rosenthal, M.D.     Microbiology: No results found for this or any previous visit (from the past 240 hour(s)).   Labs: Basic Metabolic Panel:  Recent Labs Lab 06/05/12 2111 06/06/12 0507  NA 134* 135  K 3.9 4.1  CL 99 99  CO2 21 23  GLUCOSE 110* 175*  BUN 23 20  CREATININE 0.94 0.80  CALCIUM 9.3 9.6   Liver Function Tests:  Recent Labs Lab 06/06/12 0507  AST 8  ALT 8  ALKPHOS 89  BILITOT 0.3  PROT 6.9  ALBUMIN 3.4*   No results found for this basename: LIPASE, AMYLASE,  in the last 168 hours No results found for this basename: AMMONIA,  in the last 168 hours CBC:  Recent Labs Lab 06/05/12 2111 06/06/12 0507  WBC 17.7* 20.8*  HGB 14.3 13.9  HCT 41.7 41.5  MCV 90.8 91.4  PLT 282 304   Cardiac Enzymes:  Recent Labs Lab 06/05/12 2111  TROPONINI <0.30   BNP: BNP (last 3 results)  Recent Labs  06/05/12 2111  PROBNP 161.5*   CBG:  Recent Labs Lab 06/06/12 1646 06/06/12 2128 06/07/12 0734 06/07/12 1132  GLUCAP 190* 146* 161* 136*       Signed:  Onell Mcmath  [facility] 06/07/2012, 7:41 PM

## 2012-06-07 NOTE — Progress Notes (Signed)
Patient discharge with instructions, prescriptions, and care notes.  I discussed smoking cessation. Pt verbalized understanding about the instructions, but need more teaching with the stop smoking. Pt left the floor via w/c with staff and family in stable condition.

## 2014-12-28 DIAGNOSIS — J45909 Unspecified asthma, uncomplicated: Secondary | ICD-10-CM | POA: Diagnosis not present

## 2015-01-10 DIAGNOSIS — Z23 Encounter for immunization: Secondary | ICD-10-CM | POA: Diagnosis not present

## 2015-01-25 DIAGNOSIS — R69 Illness, unspecified: Secondary | ICD-10-CM | POA: Diagnosis not present

## 2015-02-14 DIAGNOSIS — Z0001 Encounter for general adult medical examination with abnormal findings: Secondary | ICD-10-CM | POA: Diagnosis not present

## 2015-02-14 DIAGNOSIS — R7309 Other abnormal glucose: Secondary | ICD-10-CM | POA: Diagnosis not present

## 2015-02-14 DIAGNOSIS — J449 Chronic obstructive pulmonary disease, unspecified: Secondary | ICD-10-CM | POA: Diagnosis not present

## 2015-02-14 DIAGNOSIS — Z6821 Body mass index (BMI) 21.0-21.9, adult: Secondary | ICD-10-CM | POA: Diagnosis not present

## 2015-02-14 DIAGNOSIS — Z1389 Encounter for screening for other disorder: Secondary | ICD-10-CM | POA: Diagnosis not present

## 2015-02-15 DIAGNOSIS — R69 Illness, unspecified: Secondary | ICD-10-CM | POA: Diagnosis not present

## 2015-03-02 DIAGNOSIS — R3912 Poor urinary stream: Secondary | ICD-10-CM | POA: Diagnosis not present

## 2015-03-02 DIAGNOSIS — N401 Enlarged prostate with lower urinary tract symptoms: Secondary | ICD-10-CM | POA: Diagnosis not present

## 2015-03-02 DIAGNOSIS — N138 Other obstructive and reflux uropathy: Secondary | ICD-10-CM | POA: Diagnosis not present

## 2015-03-02 DIAGNOSIS — R972 Elevated prostate specific antigen [PSA]: Secondary | ICD-10-CM | POA: Diagnosis not present

## 2015-03-21 DIAGNOSIS — R69 Illness, unspecified: Secondary | ICD-10-CM | POA: Diagnosis not present

## 2015-04-09 DIAGNOSIS — J019 Acute sinusitis, unspecified: Secondary | ICD-10-CM | POA: Diagnosis not present

## 2015-04-18 DIAGNOSIS — R69 Illness, unspecified: Secondary | ICD-10-CM | POA: Diagnosis not present

## 2015-07-07 DIAGNOSIS — Z87891 Personal history of nicotine dependence: Secondary | ICD-10-CM | POA: Diagnosis not present

## 2015-07-07 DIAGNOSIS — J45901 Unspecified asthma with (acute) exacerbation: Secondary | ICD-10-CM | POA: Diagnosis not present

## 2015-07-07 DIAGNOSIS — R69 Illness, unspecified: Secondary | ICD-10-CM | POA: Diagnosis not present

## 2015-07-07 DIAGNOSIS — R05 Cough: Secondary | ICD-10-CM | POA: Diagnosis not present

## 2015-07-18 DIAGNOSIS — R69 Illness, unspecified: Secondary | ICD-10-CM | POA: Diagnosis not present

## 2015-09-20 DIAGNOSIS — R69 Illness, unspecified: Secondary | ICD-10-CM | POA: Diagnosis not present

## 2015-11-01 DIAGNOSIS — R1013 Epigastric pain: Secondary | ICD-10-CM | POA: Diagnosis not present

## 2015-11-01 DIAGNOSIS — R69 Illness, unspecified: Secondary | ICD-10-CM | POA: Diagnosis not present

## 2015-11-01 DIAGNOSIS — I447 Left bundle-branch block, unspecified: Secondary | ICD-10-CM | POA: Diagnosis not present

## 2015-11-01 DIAGNOSIS — Z7982 Long term (current) use of aspirin: Secondary | ICD-10-CM | POA: Diagnosis not present

## 2015-11-01 DIAGNOSIS — F17200 Nicotine dependence, unspecified, uncomplicated: Secondary | ICD-10-CM | POA: Diagnosis not present

## 2015-11-01 DIAGNOSIS — Z79899 Other long term (current) drug therapy: Secondary | ICD-10-CM | POA: Diagnosis not present

## 2015-11-01 DIAGNOSIS — I10 Essential (primary) hypertension: Secondary | ICD-10-CM | POA: Diagnosis not present

## 2015-11-01 DIAGNOSIS — R079 Chest pain, unspecified: Secondary | ICD-10-CM | POA: Diagnosis not present

## 2015-11-01 DIAGNOSIS — R918 Other nonspecific abnormal finding of lung field: Secondary | ICD-10-CM | POA: Diagnosis not present

## 2015-11-01 DIAGNOSIS — Z720 Tobacco use: Secondary | ICD-10-CM | POA: Diagnosis not present

## 2015-11-14 DIAGNOSIS — R69 Illness, unspecified: Secondary | ICD-10-CM | POA: Diagnosis not present

## 2015-12-02 DIAGNOSIS — R69 Illness, unspecified: Secondary | ICD-10-CM | POA: Diagnosis not present

## 2015-12-29 DIAGNOSIS — J40 Bronchitis, not specified as acute or chronic: Secondary | ICD-10-CM | POA: Diagnosis not present

## 2015-12-29 DIAGNOSIS — R69 Illness, unspecified: Secondary | ICD-10-CM | POA: Diagnosis not present

## 2015-12-29 DIAGNOSIS — R05 Cough: Secondary | ICD-10-CM | POA: Diagnosis not present

## 2016-01-12 DIAGNOSIS — R69 Illness, unspecified: Secondary | ICD-10-CM | POA: Diagnosis not present

## 2016-01-23 DIAGNOSIS — R69 Illness, unspecified: Secondary | ICD-10-CM | POA: Diagnosis not present

## 2016-01-27 DIAGNOSIS — J40 Bronchitis, not specified as acute or chronic: Secondary | ICD-10-CM | POA: Diagnosis not present

## 2016-01-27 DIAGNOSIS — R05 Cough: Secondary | ICD-10-CM | POA: Diagnosis not present

## 2016-01-27 DIAGNOSIS — B9789 Other viral agents as the cause of diseases classified elsewhere: Secondary | ICD-10-CM | POA: Diagnosis not present

## 2016-02-02 DIAGNOSIS — R69 Illness, unspecified: Secondary | ICD-10-CM | POA: Diagnosis not present

## 2016-02-26 DIAGNOSIS — J329 Chronic sinusitis, unspecified: Secondary | ICD-10-CM | POA: Diagnosis not present

## 2016-03-06 DIAGNOSIS — Z1389 Encounter for screening for other disorder: Secondary | ICD-10-CM | POA: Diagnosis not present

## 2016-03-06 DIAGNOSIS — Z6821 Body mass index (BMI) 21.0-21.9, adult: Secondary | ICD-10-CM | POA: Diagnosis not present

## 2016-03-06 DIAGNOSIS — E441 Mild protein-calorie malnutrition: Secondary | ICD-10-CM | POA: Diagnosis not present

## 2016-03-06 DIAGNOSIS — J441 Chronic obstructive pulmonary disease with (acute) exacerbation: Secondary | ICD-10-CM | POA: Diagnosis not present

## 2016-03-06 DIAGNOSIS — J439 Emphysema, unspecified: Secondary | ICD-10-CM | POA: Diagnosis not present

## 2016-03-06 DIAGNOSIS — J209 Acute bronchitis, unspecified: Secondary | ICD-10-CM | POA: Diagnosis not present

## 2016-03-11 DIAGNOSIS — R69 Illness, unspecified: Secondary | ICD-10-CM | POA: Diagnosis not present

## 2016-03-21 DIAGNOSIS — R69 Illness, unspecified: Secondary | ICD-10-CM | POA: Diagnosis not present

## 2016-04-13 DIAGNOSIS — R69 Illness, unspecified: Secondary | ICD-10-CM | POA: Diagnosis not present

## 2016-04-14 DIAGNOSIS — M25512 Pain in left shoulder: Secondary | ICD-10-CM | POA: Diagnosis not present

## 2016-04-24 DIAGNOSIS — M7552 Bursitis of left shoulder: Secondary | ICD-10-CM | POA: Diagnosis not present

## 2016-04-24 DIAGNOSIS — M25512 Pain in left shoulder: Secondary | ICD-10-CM | POA: Diagnosis not present

## 2016-05-11 DIAGNOSIS — N401 Enlarged prostate with lower urinary tract symptoms: Secondary | ICD-10-CM | POA: Diagnosis not present

## 2016-05-11 DIAGNOSIS — J449 Chronic obstructive pulmonary disease, unspecified: Secondary | ICD-10-CM | POA: Diagnosis not present

## 2016-05-11 DIAGNOSIS — R69 Illness, unspecified: Secondary | ICD-10-CM | POA: Diagnosis not present

## 2016-05-11 DIAGNOSIS — Z0000 Encounter for general adult medical examination without abnormal findings: Secondary | ICD-10-CM | POA: Diagnosis not present

## 2016-05-11 DIAGNOSIS — Z6820 Body mass index (BMI) 20.0-20.9, adult: Secondary | ICD-10-CM | POA: Diagnosis not present

## 2016-05-15 DIAGNOSIS — R69 Illness, unspecified: Secondary | ICD-10-CM | POA: Diagnosis not present

## 2016-05-29 DIAGNOSIS — M7522 Bicipital tendinitis, left shoulder: Secondary | ICD-10-CM | POA: Diagnosis not present

## 2016-05-29 DIAGNOSIS — M25512 Pain in left shoulder: Secondary | ICD-10-CM | POA: Diagnosis not present

## 2016-06-01 DIAGNOSIS — R0602 Shortness of breath: Secondary | ICD-10-CM | POA: Diagnosis not present

## 2016-06-01 DIAGNOSIS — R05 Cough: Secondary | ICD-10-CM | POA: Diagnosis not present

## 2016-06-01 DIAGNOSIS — J019 Acute sinusitis, unspecified: Secondary | ICD-10-CM | POA: Diagnosis not present

## 2016-06-19 DIAGNOSIS — M25512 Pain in left shoulder: Secondary | ICD-10-CM | POA: Diagnosis not present

## 2016-06-21 DIAGNOSIS — R69 Illness, unspecified: Secondary | ICD-10-CM | POA: Diagnosis not present

## 2016-06-25 DIAGNOSIS — M75102 Unspecified rotator cuff tear or rupture of left shoulder, not specified as traumatic: Secondary | ICD-10-CM | POA: Diagnosis not present

## 2016-06-25 DIAGNOSIS — S46012A Strain of muscle(s) and tendon(s) of the rotator cuff of left shoulder, initial encounter: Secondary | ICD-10-CM | POA: Diagnosis not present

## 2016-06-25 DIAGNOSIS — M7592 Shoulder lesion, unspecified, left shoulder: Secondary | ICD-10-CM | POA: Diagnosis not present

## 2016-06-29 DIAGNOSIS — M75112 Incomplete rotator cuff tear or rupture of left shoulder, not specified as traumatic: Secondary | ICD-10-CM | POA: Diagnosis not present

## 2016-06-29 DIAGNOSIS — M25512 Pain in left shoulder: Secondary | ICD-10-CM | POA: Diagnosis not present

## 2016-07-30 DIAGNOSIS — R69 Illness, unspecified: Secondary | ICD-10-CM | POA: Diagnosis not present

## 2016-08-23 DIAGNOSIS — J189 Pneumonia, unspecified organism: Secondary | ICD-10-CM | POA: Diagnosis not present

## 2016-08-23 DIAGNOSIS — Z79899 Other long term (current) drug therapy: Secondary | ICD-10-CM | POA: Diagnosis not present

## 2016-08-23 DIAGNOSIS — R339 Retention of urine, unspecified: Secondary | ICD-10-CM | POA: Diagnosis not present

## 2016-08-23 DIAGNOSIS — I447 Left bundle-branch block, unspecified: Secondary | ICD-10-CM | POA: Diagnosis not present

## 2016-08-23 DIAGNOSIS — Z7982 Long term (current) use of aspirin: Secondary | ICD-10-CM | POA: Diagnosis not present

## 2016-08-23 DIAGNOSIS — R0602 Shortness of breath: Secondary | ICD-10-CM | POA: Diagnosis not present

## 2016-08-23 DIAGNOSIS — F17200 Nicotine dependence, unspecified, uncomplicated: Secondary | ICD-10-CM | POA: Diagnosis not present

## 2016-08-23 DIAGNOSIS — R079 Chest pain, unspecified: Secondary | ICD-10-CM | POA: Diagnosis not present

## 2016-08-23 DIAGNOSIS — I214 Non-ST elevation (NSTEMI) myocardial infarction: Secondary | ICD-10-CM | POA: Diagnosis not present

## 2016-08-23 DIAGNOSIS — J449 Chronic obstructive pulmonary disease, unspecified: Secondary | ICD-10-CM | POA: Diagnosis not present

## 2016-08-23 DIAGNOSIS — R0789 Other chest pain: Secondary | ICD-10-CM | POA: Diagnosis not present

## 2016-08-23 DIAGNOSIS — D72829 Elevated white blood cell count, unspecified: Secondary | ICD-10-CM | POA: Diagnosis not present

## 2016-08-23 DIAGNOSIS — T83011A Breakdown (mechanical) of indwelling urethral catheter, initial encounter: Secondary | ICD-10-CM | POA: Diagnosis not present

## 2016-08-23 DIAGNOSIS — R7989 Other specified abnormal findings of blood chemistry: Secondary | ICD-10-CM | POA: Diagnosis not present

## 2016-08-23 DIAGNOSIS — J441 Chronic obstructive pulmonary disease with (acute) exacerbation: Secondary | ICD-10-CM | POA: Diagnosis not present

## 2016-08-23 DIAGNOSIS — R69 Illness, unspecified: Secondary | ICD-10-CM | POA: Diagnosis not present

## 2016-08-24 DIAGNOSIS — R0789 Other chest pain: Secondary | ICD-10-CM | POA: Diagnosis not present

## 2016-08-24 DIAGNOSIS — R7989 Other specified abnormal findings of blood chemistry: Secondary | ICD-10-CM | POA: Diagnosis not present

## 2016-08-24 DIAGNOSIS — I214 Non-ST elevation (NSTEMI) myocardial infarction: Secondary | ICD-10-CM | POA: Diagnosis not present

## 2016-08-24 DIAGNOSIS — D72829 Elevated white blood cell count, unspecified: Secondary | ICD-10-CM | POA: Diagnosis not present

## 2016-08-24 DIAGNOSIS — Z7982 Long term (current) use of aspirin: Secondary | ICD-10-CM | POA: Diagnosis not present

## 2016-08-24 DIAGNOSIS — I447 Left bundle-branch block, unspecified: Secondary | ICD-10-CM | POA: Diagnosis not present

## 2016-08-24 DIAGNOSIS — R0602 Shortness of breath: Secondary | ICD-10-CM | POA: Diagnosis not present

## 2016-08-24 DIAGNOSIS — R079 Chest pain, unspecified: Secondary | ICD-10-CM | POA: Diagnosis not present

## 2016-08-24 DIAGNOSIS — J441 Chronic obstructive pulmonary disease with (acute) exacerbation: Secondary | ICD-10-CM | POA: Diagnosis not present

## 2016-08-24 DIAGNOSIS — J449 Chronic obstructive pulmonary disease, unspecified: Secondary | ICD-10-CM | POA: Diagnosis not present

## 2016-08-24 DIAGNOSIS — R69 Illness, unspecified: Secondary | ICD-10-CM | POA: Diagnosis not present

## 2016-08-24 DIAGNOSIS — F17200 Nicotine dependence, unspecified, uncomplicated: Secondary | ICD-10-CM | POA: Diagnosis not present

## 2016-08-24 DIAGNOSIS — Z79899 Other long term (current) drug therapy: Secondary | ICD-10-CM | POA: Diagnosis not present

## 2016-08-24 DIAGNOSIS — J189 Pneumonia, unspecified organism: Secondary | ICD-10-CM | POA: Diagnosis not present

## 2016-08-26 DIAGNOSIS — R079 Chest pain, unspecified: Secondary | ICD-10-CM | POA: Diagnosis not present

## 2016-08-27 DIAGNOSIS — N400 Enlarged prostate without lower urinary tract symptoms: Secondary | ICD-10-CM | POA: Diagnosis not present

## 2016-08-27 DIAGNOSIS — J449 Chronic obstructive pulmonary disease, unspecified: Secondary | ICD-10-CM | POA: Diagnosis not present

## 2016-08-27 DIAGNOSIS — Z1389 Encounter for screening for other disorder: Secondary | ICD-10-CM | POA: Diagnosis not present

## 2016-08-27 DIAGNOSIS — Z6820 Body mass index (BMI) 20.0-20.9, adult: Secondary | ICD-10-CM | POA: Diagnosis not present

## 2016-08-28 DIAGNOSIS — R69 Illness, unspecified: Secondary | ICD-10-CM | POA: Diagnosis not present

## 2016-08-30 DIAGNOSIS — R339 Retention of urine, unspecified: Secondary | ICD-10-CM | POA: Diagnosis not present

## 2016-09-10 DIAGNOSIS — K219 Gastro-esophageal reflux disease without esophagitis: Secondary | ICD-10-CM | POA: Diagnosis not present

## 2016-09-10 DIAGNOSIS — Z6820 Body mass index (BMI) 20.0-20.9, adult: Secondary | ICD-10-CM | POA: Diagnosis not present

## 2016-09-10 DIAGNOSIS — R7309 Other abnormal glucose: Secondary | ICD-10-CM | POA: Diagnosis not present

## 2016-09-10 NOTE — Progress Notes (Signed)
Nurse practitioner    Cardiology Office Note   Date:  09/13/2016   ID:  Devin CostainFrank L Givhan, DOB April 12, 1945, MRN [id number]  PCP:  [name], John, MD  Cardiologist:   Rollene RotundaJames Corda Shutt, MD  Referring:  [name], John, MD  Chief Complaint  Patient presents with  . Shortness of Breath      History of Present Illness: Devin Barton is a 71 y.o. male who is referred by [name], John, MD for follow up after [facility]. I reviewed these records for this visit.  He was admitted on June 1 with chest discomfort. He did have an elevated troponin. He did have a slightly elevated BNP.   EKG demonstrated left bundle branch block. (This was said to be chronic) He was treated with nebulizers for possible COPD exacerbation. He did receive Lasix. I don't see any further cardiac evaluation.    He said that on that day he presented with pain that he thought was reflux. He said it was a burning discomfort mid chest. He went to the hospital because it didn't improve with Tums. Was 5 out of 10 in intensity. He says he's not had any other symptoms since then. He's not had any chest pressure otherwise. He's had no neck or arm discomfort. He's had no palpitations, presyncope or syncope. He's had no new shortness of breath, PND or orthopnea. He's had no weight gain or edema. He's had no prior cardiac workup. He says he works out in his greenhouse in his garden and he has some wheezing and some baseline shortness of breath but this is unchanged from previous.  Past Medical History:  Diagnosis Date  . Cigarette smoker   . COPD (chronic obstructive pulmonary disease) (HCC)     Past Surgical History:  Procedure Laterality Date  . ABDOMINAL SURGERY     Bowel perf secondary to trauma  . FOOT SURGERY    . HERNIA REPAIR     3 different times     Current Outpatient Prescriptions  Medication Sig Dispense Refill  . albuterol (PROAIR HFA) 108 (90 BASE) MCG/ACT inhaler Inhale 2 puffs into the lungs every 6  (six) hours as needed for wheezing or shortness of breath.    Marland Kitchen. albuterol (PROVENTIL) (2.5 MG/3ML) 0.083% nebulizer solution Take 2.5 mg by nebulization every 6 (six) hours as needed for wheezing or shortness of breath.    Marland Kitchen. aspirin EC 81 MG tablet Take 162 mg by mouth daily.     . ciprofloxacin (CIPRO) 500 MG tablet Take 500 mg by mouth 2 (two) times daily.    Marland Kitchen. omeprazole (PRILOSEC) 40 MG capsule Take 40 mg by mouth daily.    . tamsulosin (FLOMAX) 0.4 MG CAPS Take 0.4 mg by mouth every evening.     No current facility-administered medications for this visit.     Allergies:   Patient has no known allergies.    Social History:  The patient  reports that he has been smoking Cigarettes.  He has a 30.00 pack-year smoking history. He has never used smokeless tobacco. He reports that he does not drink alcohol or use drugs.   Family History:  The patient's family history includes Alzheimer's disease in his mother; Heart disease in his mother.    ROS:  Please see the history of present illness.   Otherwise, review of systems are positive for 30 pound weight loss over several years with a negative workup.   All other systems are reviewed and negative.  PHYSICAL EXAM: VS:  BP 138/80 (BP Location: Right Arm, Cuff Size: Normal)   Pulse 88   Ht 5\' 8"  (1.727 m)   Wt 130 lb (59 kg)   SpO2 97%   BMI 19.77 kg/m  , BMI Body mass index is 19.77 kg/m. GENERAL:  Well appearing, very thin appearing HEENT:  Pupils equal round and reactive, fundi not visualized, oral mucosa unremarkable, dentures NECK:  No jugular venous distention, waveform within normal limits, carotid upstroke brisk and symmetric, no bruits, no thyromegaly LYMPHATICS:  No cervical, inguinal adenopathy LUNGS:  Clear to auscultation bilaterally BACK:  No CVA tenderness CHEST:  Unremarkable, pectus excavatum.  HEART:  PMI not displaced or sustained,S1 and S2 within normal limits, no S3, no S4, no clicks, no rubs, no murmurs ABD:   Flat, positive bowel sounds normal in frequency in pitch, no bruits, no rebound, no guarding, no midline pulsatile mass, no hepatomegaly, no splenomegaly EXT:  2 plus pulses throughout, no edema, no cyanosis no clubbing SKIN:  No rashes no nodules NEURO:  Cranial nerves II through XII grossly intact, motor grossly intact throughout PSYCH:  Cognitively intact, oriented to person place and time    EKG:  EKG is not ordered today. The ekg ordered 08/24/16 demonstrates sinus rhythm with left bundle branch block and left axis deviation   Recent Labs: No results found for requested labs within last 8760 hours.    Lipid Panel No results found for: CHOL, TRIG, HDL, CHOLHDL, VLDL, LDLCALC, LDLDIRECT    Wt Readings from Last 3 Encounters:  09/12/16 130 lb (59 kg)  06/07/12 139 lb 15.9 oz (63.5 kg)  09/13/10 157 lb (71.2 kg)      Other studies Reviewed: Additional studies/ records that were reviewed today include: Hospital records. Review of the above records demonstrates:  Please see elsewhere in the note.     ASSESSMENT AND PLAN:  CHEST PAIN:  He had chest pain as described. I think there is at least a moderate pretest probability of obstructive coronary disease. I'm going to order a an echocardiogram and a Lexiscan Myoview.  TOBACCO:  We discussed this and he does not think that he can quit smoking.    LBBB:  This will be evaluated as above.   Current medicines are reviewed at length with the patient today.  The patient does not have concerns regarding medicines.  The following changes have been made:  no change  Labs/ tests ordered today include:   Orders Placed This Encounter  Procedures  . Myocardial Perfusion Imaging  . ECHOCARDIOGRAM COMPLETE     Disposition:   FU with me based on the results of the above.     Signed, Rollene Rotunda, MD  09/13/2016 8:49 AM    [facility]

## 2016-09-12 ENCOUNTER — Telehealth: Payer: Self-pay | Admitting: Cardiology

## 2016-09-12 ENCOUNTER — Ambulatory Visit (INDEPENDENT_AMBULATORY_CARE_PROVIDER_SITE_OTHER): Payer: Medicare HMO | Admitting: Cardiology

## 2016-09-12 ENCOUNTER — Encounter: Payer: Self-pay | Admitting: Cardiology

## 2016-09-12 VITALS — BP 138/80 | HR 88 | Ht 68.0 in | Wt 130.0 lb

## 2016-09-12 DIAGNOSIS — R9431 Abnormal electrocardiogram [ECG] [EKG]: Secondary | ICD-10-CM

## 2016-09-12 DIAGNOSIS — Z720 Tobacco use: Secondary | ICD-10-CM

## 2016-09-12 DIAGNOSIS — R072 Precordial pain: Secondary | ICD-10-CM

## 2016-09-12 NOTE — Patient Instructions (Signed)
Medication Instructions:  Your physician recommends that you continue on your current medications as directed. Please refer to the Current Medication list given to you today.  Labwork: NONE  Testing/Procedures: Your physician has requested that you have an echocardiogram. Echocardiography is a painless test that uses sound waves to create images of your heart. It provides your doctor with information about the size and shape of your heart and how well your heart's chambers and valves are working. This procedure takes approximately one hour. There are no restrictions for this procedure.  Your physician has requested that you have en exercise stress myoview. For further information please visit www.[email or web address]. Please follow instruction sheet, as given.  Follow-Up: Your physician recommends that you schedule a follow-up appointment PENDING TEST RESULTS   Any Other Special Instructions Will Be Listed Below (If Applicable).  If you need a refill on your cardiac medications before your next appointment, please call your pharmacy. 

## 2016-09-12 NOTE — Telephone Encounter (Signed)
Pre-cert Verification for the following procedure    Echo scheduled for 09/19/16 [facility]

## 2016-09-13 ENCOUNTER — Encounter: Payer: Self-pay | Admitting: Cardiology

## 2016-09-13 ENCOUNTER — Other Ambulatory Visit: Payer: Self-pay

## 2016-09-13 DIAGNOSIS — R072 Precordial pain: Secondary | ICD-10-CM | POA: Insufficient documentation

## 2016-09-13 DIAGNOSIS — R079 Chest pain, unspecified: Secondary | ICD-10-CM

## 2016-09-13 DIAGNOSIS — R9431 Abnormal electrocardiogram [ECG] [EKG]: Secondary | ICD-10-CM | POA: Insufficient documentation

## 2016-09-14 ENCOUNTER — Encounter: Payer: Self-pay | Admitting: Internal Medicine

## 2016-09-19 ENCOUNTER — Encounter (HOSPITAL_COMMUNITY): Payer: Self-pay

## 2016-09-19 ENCOUNTER — Encounter (HOSPITAL_COMMUNITY)
Admission: RE | Admit: 2016-09-19 | Discharge: 2016-09-19 | Disposition: A | Discharge status: Home / Self Care | Payer: Medicare HMO | Source: Ambulatory Visit | Attending: Cardiology | Admitting: Cardiology

## 2016-09-19 ENCOUNTER — Encounter (HOSPITAL_BASED_OUTPATIENT_CLINIC_OR_DEPARTMENT_OTHER)
Admission: RE | Admit: 2016-09-19 | Discharge: 2016-09-19 | Disposition: A | Discharge status: Home / Self Care | Payer: Medicare HMO | Source: Ambulatory Visit | Attending: Cardiology | Admitting: Cardiology

## 2016-09-19 DIAGNOSIS — R079 Chest pain, unspecified: Secondary | ICD-10-CM

## 2016-09-19 HISTORY — DX: Unspecified asthma, uncomplicated: J45.909

## 2016-09-19 LAB — NM MYOCAR MULTI W/SPECT W/WALL MOTION / EF
CHL CUP NUCLEAR SRS: 26
CHL CUP RESTING HR STRESS: 65 {beats}/min
LV dias vol: 198 mL (ref 62–150)
LV sys vol: 133 mL
NUC STRESS TID: 1.07
Peak HR: 106 {beats}/min
RATE: 0.32
SDS: 4
SSS: 30

## 2016-09-19 MED ORDER — SODIUM CHLORIDE 0.9% FLUSH
INTRAVENOUS | Status: AC
  Administered 2016-09-19: 10 mL via INTRAVENOUS
  Filled 2016-09-19: qty 10

## 2016-09-19 MED ORDER — REGADENOSON 0.4 MG/5ML IV SOLN
INTRAVENOUS | Status: AC
  Administered 2016-09-19: 0.4 mg via INTRAVENOUS
  Filled 2016-09-19: qty 5

## 2016-09-19 MED ORDER — TECHNETIUM TC 99M TETROFOSMIN IV KIT
10.0000 | PACK | Freq: Once | INTRAVENOUS | Status: AC | PRN
  Administered 2016-09-19: 10.7 via INTRAVENOUS

## 2016-09-19 MED ORDER — TECHNETIUM TC 99M TETROFOSMIN IV KIT
30.0000 | PACK | Freq: Once | INTRAVENOUS | Status: AC | PRN
  Administered 2016-09-19: 31 via INTRAVENOUS

## 2016-09-20 ENCOUNTER — Ambulatory Visit (INDEPENDENT_AMBULATORY_CARE_PROVIDER_SITE_OTHER): Payer: Medicare HMO

## 2016-09-20 ENCOUNTER — Other Ambulatory Visit: Payer: Self-pay

## 2016-09-20 DIAGNOSIS — R9431 Abnormal electrocardiogram [ECG] [EKG]: Secondary | ICD-10-CM

## 2016-09-23 HISTORY — PX: CORONARY ARTERY BYPASS GRAFT: SHX141

## 2016-09-23 HISTORY — PX: CARDIAC CATHETERIZATION: SHX172

## 2016-09-27 ENCOUNTER — Telehealth: Payer: Self-pay | Admitting: *Deleted

## 2016-09-27 NOTE — Telephone Encounter (Signed)
Patient informed and copy sent to PCP. 

## 2016-09-27 NOTE — Telephone Encounter (Signed)
-----   Message from Rollene RotundaJames Hochrein, MD sent at 09/26/2016 11:14 AM EDT ----- Abnormal stress test.  [place] is to be follow up in [place].   Send results to [name], John, MD

## 2016-09-27 NOTE — Telephone Encounter (Signed)
-----   Message from Rollene RotundaJames Hochrein, MD sent at 09/26/2016 11:13 AM EDT ----- I discussed the results of the echo and stress test with the patient.  I suggested cardiac cath.  He would like to come to the office to discuss this.  However, he would like to be seen in [place].  Please call and arrange a follow up appt next week in [place].  Send results to [name], John, MD

## 2016-09-28 ENCOUNTER — Encounter: Payer: Self-pay | Admitting: Cardiology

## 2016-09-28 ENCOUNTER — Ambulatory Visit (INDEPENDENT_AMBULATORY_CARE_PROVIDER_SITE_OTHER): Payer: Medicare HMO | Admitting: Cardiology

## 2016-09-28 ENCOUNTER — Telehealth: Payer: Self-pay | Admitting: Cardiology

## 2016-09-28 VITALS — BP 118/62 | HR 78 | Ht 68.0 in | Wt 129.0 lb

## 2016-09-28 DIAGNOSIS — I5021 Acute systolic (congestive) heart failure: Secondary | ICD-10-CM | POA: Diagnosis not present

## 2016-09-28 DIAGNOSIS — R0789 Other chest pain: Secondary | ICD-10-CM | POA: Diagnosis not present

## 2016-09-28 DIAGNOSIS — J441 Chronic obstructive pulmonary disease with (acute) exacerbation: Secondary | ICD-10-CM

## 2016-09-28 MED ORDER — METOPROLOL SUCCINATE ER 25 MG PO TB24: 12.5000 mg | ORAL_TABLET | Freq: Every day | ORAL | 1 refills | Status: DC

## 2016-09-28 NOTE — Patient Instructions (Signed)
Your physician recommends that you schedule a follow-up appointment in: [facility]EE[facility]  Your physician has recommended you make the following change in your medication:   START TOPROL XL 12.5 MG DAILY (1/2 TABLET)  [facility]     [facility] [address] [place] [address] Dept: [phone] Loc: [phone]  Davene CostainFrank L Pytel  09/28/2016  You are scheduled for a HEART CATHETERIZATION with Dr. Lance MussJayadeep Varanasi.  1. [facility]: [address] [place], [place] [address] at 8:30 AM (two hours before your procedure to ensure your preparation). Free valet parking service is available.   Special note: Every effort is made to have your procedure done on time. Please understand that emergencies sometimes delay scheduled procedures.  2. Diet: Do not eat or drink anything after midnight prior to your procedure except sips of water to take medications.  3. Labs: Your labs will be performed at the hospital after you arrive for your procedure.  4. Medication instructions in preparation for your procedure:  *For reference purposes while preparing patient instructions.   Delete this med list prior to printing instructions for patient.*   On the morning of your procedure, take your Aspirin and any morning medicines NOT listed above.  You may use sips of water.  5. Plan for one night stay--bring personal belongings. 6. Bring a current list of your medications and current insurance cards. 7. You MUST have a responsible person to drive you home. 8. Someone MUST be with you the first 24 hours after you arrive home or your discharge will be delayed. 9. Please wear clothes that are easy to get on and off and wear slip-on shoes.  Thank you for allowing us to care for you!   -- [facility]

## 2016-09-28 NOTE — Progress Notes (Signed)
Clinical Summary [name] is a 71 y.o.male last seen by Dr Antoine PocheHochrein, this is our first visit together.  1. Chest pain/ acutesystolic HF - [facility] - EKG with LBBB thought to be chronic - treated for COPD exacerbation with plans for outpatient cardiology evaluation - seen by Dr Antoine PocheHochrein, [facility] - 08/2016 Lexiscan: large area of scar as reported below, no significant ischemia. LVEF 30-44%. High risk due to low LVEF and scar - 08/2016: LVEF 25-30%,  severe hypokinesis of the anteroseptal and apical myocardium. No definite LV mural   thrombus but images are somewhat limited, could consider a   Definity contrast study.  - still with SOB. Example walking through [employer]. No edema. No recent chest pain  2. COPD - followed by pcp - last seen by Dr Sherene SiresWert 07/2007. Notes mention severe COPD. Had been on advair but no longer taking, only has rescue inhaler  Past Medical History:  Diagnosis Date  . Asthma   . Cigarette smoker   . COPD (chronic obstructive pulmonary disease) (HCC)      No Known Allergies   Current Outpatient Prescriptions  Medication Sig Dispense Refill  . albuterol (PROAIR HFA) 108 (90 BASE) MCG/ACT inhaler Inhale 2 puffs into the lungs every 6 (six) hours as needed for wheezing or shortness of breath.    Marland Kitchen. albuterol (PROVENTIL) (2.5 MG/3ML) 0.083% nebulizer solution Take 2.5 mg by nebulization every 6 (six) hours as needed for wheezing or shortness of breath.    Marland Kitchen. aspirin EC 81 MG tablet Take 162 mg by mouth daily.     . ciprofloxacin (CIPRO) 500 MG tablet Take 500 mg by mouth 2 (two) times daily.    Marland Kitchen. omeprazole (PRILOSEC) 40 MG capsule Take 40 mg by mouth daily.    . tamsulosin (FLOMAX) 0.4 MG CAPS Take 0.4 mg by mouth every evening.     No current facility-administered medications for this visit.      Past Surgical History:  Procedure Laterality Date  . ABDOMINAL SURGERY     Bowel perf secondary to trauma  . FOOT  SURGERY    . HERNIA REPAIR     3 different times     No Known Allergies    Family History  Problem Relation Age of Onset  . Heart disease Mother        No details  . Alzheimer's disease Mother   . Atopy Neg Hx      Social History [name] reports that he has been smoking Cigarettes.  He has a 30.00 pack-year smoking history. He has never used smokeless tobacco. [name] reports that he does not drink alcohol.   Review of Systems CONSTITUTIONAL: No weight loss, fever, chills, weakness or fatigue.  HEENT: Eyes: No visual loss, blurred vision, double vision or yellow sclerae.No hearing loss, sneezing, congestion, runny nose or sore throat.  SKIN: No rash or itching.  CARDIOVASCULAR: per hpi RESPIRATORY: No shortness of breath, cough or sputum.  GASTROINTESTINAL: No anorexia, nausea, vomiting or diarrhea. No abdominal pain or blood.  GENITOURINARY: No burning on urination, no polyuria NEUROLOGICAL: No headache, dizziness, syncope, paralysis, ataxia, numbness or tingling in the extremities. No change in bowel or bladder control.  MUSCULOSKELETAL: No muscle, back pain, joint pain or stiffness.  LYMPHATICS: No enlarged nodes. No history of splenectomy.  PSYCHIATRIC: No history of depression or anxiety.  ENDOCRINOLOGIC: No reports of sweating, cold or heat intolerance. No polyuria or polydipsia.  .Marland Kitchen  Physical Examination Vitals:   09/28/16 1451  BP: 118/62  Pulse: 78   Vitals:   09/28/16 1451  Weight: 129 lb (58.5 kg)  Height: 5\' 8"  (1.727 m)    Gen: resting comfortably, no acute distress HEENT: no scleral icterus, pupils equal round and reactive, no palptable cervical adenopathy,  CV: RRR, n m/r/g, no jvd Resp: Clear to auscultation bilaterally GI: abdomen is soft, non-tender, non-distended, normal bowel sounds, no hepatosplenomegaly MSK: extremities are warm, no edema.  Skin: warm, no rash Neuro:  no focal deficits Psych: appropriate affect   Diagnostic  Studies 08/2016 Lexiscan MPI  There was no ST segment deviation noted during stress.  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 High risk based on large scar burden and decreased LVEF. There is no significant myocardium currently at jeopardy. Consider correlating LVEF with echo.  The left ventricular ejection fraction is moderately decreased (30-44%).   08/2016 echo Study Conclusions  - Left ventricle: The cavity size was normal. Wall thickness was   increased in a pattern of mild LVH. Systolic function was   severely reduced. The estimated ejection fraction was in the   range of 25% to 30%. Abnormal global longitudinal strain of   -11.3%. Diffuse hypokinesis. There is severe hypokinesis of the   anteroseptal and apical myocardium. Doppler parameters are   consistent with abnormal left ventricular relaxation (grade 1   diastolic dysfunction). - Ventricular septum: Septal motion showed abnormal function and   dyssynergy. - Aortic valve: Mildly calcified annulus. Trileaflet; mildly   calcified leaflets. Valve area (Vmax): 1.89 cm^2. - Mitral valve: Calcified annulus. Mildly thickened leaflets .   There was mild regurgitation. - Right atrium: Central venous pressure (est): 3 mm Hg. - Atrial septum: No defect or patent foramen ovale was identified. - Tricuspid valve: There was trivial regurgitation. - Pulmonary arteries: PA peak pressure: 19 mm Hg (S). - Pericardium, extracardiac: There was no pericardial effusion.  Impressions:  - Mild LVH with LVEF approximately 25-30%. There is diffuse   hypokinesis with septal dyssynergy and hypokinesis of the   anteroseptal and apical myocardium. 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 Grade 1 diastolic dysfunction. No definite LV mural   thrombus but images are somewhat  limited, could consider a   Definity contrast study. Mildly calcified mitral annulus with   mildly thickened leaflets and mild mitral regurgitation. Mildly   sclerotic aortic valve. Trivial tricuspid regurgitation with PASP   estimated 19 mmHg.    Assessment and Plan  1. Chest pain/acute systolic HF - patient with LVEF 25-30%, chronic LBBB, recent symptoms of chest pain and SOB - we will refer for LHC/RHC in setting of newly diagnosed systolic dysfunction. If confirmed CAD will need statin.  - start ToproL XL12.5mg  daily, further med adjustments after cath  2. COPD - refer to Dr Juanetta GoslingHawkins  F/u 2-3 weeks    Antoine PocheJonathan F. Christina Waldrop, M.D

## 2016-09-28 NOTE — Telephone Encounter (Signed)
Pre-cert Verification for the following procedure    [facility] 10/04/16 DR Eldridge DaceVARANASI 10:30AM

## 2016-10-01 ENCOUNTER — Other Ambulatory Visit: Payer: Self-pay | Admitting: Cardiology

## 2016-10-01 DIAGNOSIS — I5021 Acute systolic (congestive) heart failure: Secondary | ICD-10-CM

## 2016-10-02 ENCOUNTER — Telehealth: Payer: Self-pay

## 2016-10-02 NOTE — Telephone Encounter (Signed)
[facility] scheduled for: 10/04/2016 @ 1030  Verified arrival time and place:  Gave direction to the NT.  Notified Pt he should arrive @ 0800.  Stated that twice.  Confirmed AM meds to be taken pre-cath with sip of water:  Notified Pt to take ASA prior to arrival.   Confirmed patient has responsible person to drive home post procedure and observe patient for 24 hours:  Pt states yes.  Addl concerns:  Labs were not ordered at office visit.

## 2016-10-04 ENCOUNTER — Inpatient Hospital Stay (HOSPITAL_COMMUNITY): Payer: Medicare HMO

## 2016-10-04 ENCOUNTER — Encounter (HOSPITAL_COMMUNITY): Payer: Self-pay | Admitting: *Deleted

## 2016-10-04 ENCOUNTER — Encounter (HOSPITAL_COMMUNITY)
Admission: AD | Disposition: A | Discharge status: Home Health | Payer: Self-pay | Source: Ambulatory Visit | Attending: Thoracic Surgery (Cardiothoracic Vascular Surgery)

## 2016-10-04 ENCOUNTER — Other Ambulatory Visit: Payer: Self-pay | Admitting: *Deleted

## 2016-10-04 ENCOUNTER — Inpatient Hospital Stay (HOSPITAL_COMMUNITY)
Admission: AD | Admit: 2016-10-04 | Discharge: 2016-10-12 | DRG: 233 | Disposition: A | Discharge status: Home Health | Payer: Medicare HMO | Source: Ambulatory Visit | Attending: Thoracic Surgery (Cardiothoracic Vascular Surgery) | Admitting: Thoracic Surgery (Cardiothoracic Vascular Surgery)

## 2016-10-04 DIAGNOSIS — Z7982 Long term (current) use of aspirin: Secondary | ICD-10-CM | POA: Diagnosis not present

## 2016-10-04 DIAGNOSIS — I2510 Atherosclerotic heart disease of native coronary artery without angina pectoris: Secondary | ICD-10-CM | POA: Diagnosis present

## 2016-10-04 DIAGNOSIS — J90 Pleural effusion, not elsewhere classified: Secondary | ICD-10-CM | POA: Diagnosis not present

## 2016-10-04 DIAGNOSIS — Z951 Presence of aortocoronary bypass graft: Secondary | ICD-10-CM

## 2016-10-04 DIAGNOSIS — F17210 Nicotine dependence, cigarettes, uncomplicated: Secondary | ICD-10-CM | POA: Diagnosis present

## 2016-10-04 DIAGNOSIS — D62 Acute posthemorrhagic anemia: Secondary | ICD-10-CM | POA: Diagnosis not present

## 2016-10-04 DIAGNOSIS — I510 Cardiac septal defect, acquired: Secondary | ICD-10-CM | POA: Diagnosis not present

## 2016-10-04 DIAGNOSIS — E43 Unspecified severe protein-calorie malnutrition: Secondary | ICD-10-CM | POA: Diagnosis present

## 2016-10-04 DIAGNOSIS — Z79899 Other long term (current) drug therapy: Secondary | ICD-10-CM

## 2016-10-04 DIAGNOSIS — I25118 Atherosclerotic heart disease of native coronary artery with other forms of angina pectoris: Secondary | ICD-10-CM

## 2016-10-04 DIAGNOSIS — I447 Left bundle-branch block, unspecified: Secondary | ICD-10-CM | POA: Diagnosis present

## 2016-10-04 DIAGNOSIS — I110 Hypertensive heart disease with heart failure: Secondary | ICD-10-CM | POA: Diagnosis present

## 2016-10-04 DIAGNOSIS — R0989 Other specified symptoms and signs involving the circulatory and respiratory systems: Secondary | ICD-10-CM | POA: Diagnosis present

## 2016-10-04 DIAGNOSIS — I9789 Other postprocedural complications and disorders of the circulatory system, not elsewhere classified: Secondary | ICD-10-CM | POA: Diagnosis not present

## 2016-10-04 DIAGNOSIS — K5900 Constipation, unspecified: Secondary | ICD-10-CM | POA: Diagnosis present

## 2016-10-04 DIAGNOSIS — N401 Enlarged prostate with lower urinary tract symptoms: Secondary | ICD-10-CM | POA: Diagnosis present

## 2016-10-04 DIAGNOSIS — R748 Abnormal levels of other serum enzymes: Secondary | ICD-10-CM | POA: Diagnosis present

## 2016-10-04 DIAGNOSIS — I4891 Unspecified atrial fibrillation: Secondary | ICD-10-CM | POA: Diagnosis not present

## 2016-10-04 DIAGNOSIS — Z9689 Presence of other specified functional implants: Secondary | ICD-10-CM

## 2016-10-04 DIAGNOSIS — Z8249 Family history of ischemic heart disease and other diseases of the circulatory system: Secondary | ICD-10-CM

## 2016-10-04 DIAGNOSIS — R338 Other retention of urine: Secondary | ICD-10-CM | POA: Diagnosis present

## 2016-10-04 DIAGNOSIS — I10 Essential (primary) hypertension: Secondary | ICD-10-CM | POA: Diagnosis not present

## 2016-10-04 DIAGNOSIS — J9811 Atelectasis: Secondary | ICD-10-CM | POA: Diagnosis present

## 2016-10-04 DIAGNOSIS — R079 Chest pain, unspecified: Secondary | ICD-10-CM | POA: Diagnosis not present

## 2016-10-04 DIAGNOSIS — I083 Combined rheumatic disorders of mitral, aortic and tricuspid valves: Secondary | ICD-10-CM | POA: Diagnosis not present

## 2016-10-04 DIAGNOSIS — I517 Cardiomegaly: Secondary | ICD-10-CM | POA: Diagnosis not present

## 2016-10-04 DIAGNOSIS — Z820 Family history of epilepsy and other diseases of the nervous system: Secondary | ICD-10-CM

## 2016-10-04 DIAGNOSIS — Z01810 Encounter for preprocedural cardiovascular examination: Secondary | ICD-10-CM

## 2016-10-04 DIAGNOSIS — I5021 Acute systolic (congestive) heart failure: Secondary | ICD-10-CM | POA: Diagnosis not present

## 2016-10-04 DIAGNOSIS — Z6821 Body mass index (BMI) 21.0-21.9, adult: Secondary | ICD-10-CM | POA: Diagnosis not present

## 2016-10-04 DIAGNOSIS — N320 Bladder-neck obstruction: Secondary | ICD-10-CM | POA: Diagnosis not present

## 2016-10-04 DIAGNOSIS — I440 Atrioventricular block, first degree: Secondary | ICD-10-CM | POA: Diagnosis present

## 2016-10-04 DIAGNOSIS — I25110 Atherosclerotic heart disease of native coronary artery with unstable angina pectoris: Secondary | ICD-10-CM | POA: Diagnosis not present

## 2016-10-04 DIAGNOSIS — J449 Chronic obstructive pulmonary disease, unspecified: Secondary | ICD-10-CM | POA: Diagnosis present

## 2016-10-04 DIAGNOSIS — R918 Other nonspecific abnormal finding of lung field: Secondary | ICD-10-CM | POA: Diagnosis not present

## 2016-10-04 DIAGNOSIS — Z09 Encounter for follow-up examination after completed treatment for conditions other than malignant neoplasm: Secondary | ICD-10-CM

## 2016-10-04 DIAGNOSIS — I25810 Atherosclerosis of coronary artery bypass graft(s) without angina pectoris: Secondary | ICD-10-CM | POA: Diagnosis not present

## 2016-10-04 HISTORY — DX: Gastro-esophageal reflux disease without esophagitis: K21.9

## 2016-10-04 HISTORY — PX: RIGHT/LEFT HEART CATH AND CORONARY ANGIOGRAPHY: CATH118266

## 2016-10-04 LAB — CBC
HCT: 42.3 % (ref 39.0–52.0)
Hemoglobin: 13.9 g/dL (ref 13.0–17.0)
MCH: 30.8 pg (ref 26.0–34.0)
MCHC: 32.9 g/dL (ref 30.0–36.0)
MCV: 93.6 fL (ref 78.0–100.0)
PLATELETS: 319 10*3/uL (ref 150–400)
RBC: 4.52 MIL/uL (ref 4.22–5.81)
RDW: 14.2 % (ref 11.5–15.5)
WBC: 6.2 10*3/uL (ref 4.0–10.5)

## 2016-10-04 LAB — PULMONARY FUNCTION TEST
FEF 25-75 POST: 0.43 L/s
FEF 25-75 Pre: 0.24 L/sec
FEF2575-%Change-Post: 79 %
FEF2575-%PRED-PRE: 10 %
FEF2575-%Pred-Post: 19 %
FEV1-%Change-Post: 31 %
FEV1-%PRED-PRE: 25 %
FEV1-%Pred-Post: 33 %
FEV1-POST: 0.98 L
FEV1-Pre: 0.75 L
FEV1FVC-%Change-Post: 18 %
FEV1FVC-%PRED-PRE: 49 %
FEV6-%Change-Post: 19 %
FEV6-%PRED-POST: 52 %
FEV6-%PRED-PRE: 44 %
FEV6-POST: 2.02 L
FEV6-Pre: 1.69 L
FEV6FVC-%CHANGE-POST: 7 %
FEV6FVC-%PRED-POST: 92 %
FEV6FVC-%Pred-Pre: 86 %
FVC-%Change-Post: 11 %
FVC-%PRED-POST: 57 %
FVC-%PRED-PRE: 51 %
FVC-POST: 2.31 L
FVC-PRE: 2.08 L
POST FEV6/FVC RATIO: 87 %
PRE FEV1/FVC RATIO: 36 %
Post FEV1/FVC ratio: 43 %
Pre FEV6/FVC Ratio: 81 %

## 2016-10-04 LAB — VAS US DOPPLER PRE CABG
LCCADDIAS: -32 cm/s
LCCADSYS: -85 cm/s
LCCAPDIAS: 32 cm/s
LCCAPSYS: 95 cm/s
LEFT ECA DIAS: -24 cm/s
LEFT VERTEBRAL DIAS: -27 cm/s
Left ICA dist dias: -41 cm/s
Left ICA dist sys: -138 cm/s
Left ICA prox dias: -83 cm/s
Left ICA prox sys: -224 cm/s
RCCADSYS: -89 cm/s
RCCAPDIAS: -29 cm/s
RCCAPSYS: -101 cm/s
RIGHT ECA DIAS: -14 cm/s
RIGHT VERTEBRAL DIAS: -17 cm/s

## 2016-10-04 LAB — POCT I-STAT 3, VENOUS BLOOD GAS (G3P V)
Bicarbonate: 25.4 mmol/L (ref 20.0–28.0)
O2 SAT: 61 %
PO2 VEN: 32 mmHg (ref 32.0–45.0)
TCO2: 27 mmol/L (ref 0–100)
pCO2, Ven: 42.1 mmHg — ABNORMAL LOW (ref 44.0–60.0)
pH, Ven: 7.389 (ref 7.250–7.430)

## 2016-10-04 LAB — PROTIME-INR
INR: 0.99
PROTHROMBIN TIME: 13.1 s (ref 11.4–15.2)

## 2016-10-04 LAB — BASIC METABOLIC PANEL
Anion gap: 7 (ref 5–15)
BUN: 9 mg/dL (ref 6–20)
CALCIUM: 9.2 mg/dL (ref 8.9–10.3)
CO2: 26 mmol/L (ref 22–32)
Chloride: 100 mmol/L — ABNORMAL LOW (ref 101–111)
Creatinine, Ser: 0.99 mg/dL (ref 0.61–1.24)
GFR calc Af Amer: >60 mL/min (ref >60)
Glucose, Bld: 87 mg/dL (ref 65–99)
POTASSIUM: 4.1 mmol/L (ref 3.5–5.1)
SODIUM: 133 mmol/L — AB (ref 135–145)

## 2016-10-04 LAB — POCT I-STAT 3, ART BLOOD GAS (G3+)
Acid-base deficit: 2 mmol/L (ref 0.0–2.0)
Bicarbonate: 23.1 mmol/L (ref 20.0–28.0)
O2 SAT: 90 %
PCO2 ART: 37.9 mmHg (ref 32.0–48.0)
TCO2: 24 mmol/L (ref 0–100)
pH, Arterial: 7.392 (ref 7.350–7.450)
pO2, Arterial: 58 mmHg — ABNORMAL LOW (ref 83.0–108.0)

## 2016-10-04 LAB — TYPE AND SCREEN
ABO/RH(D): A POS
Antibody Screen: NEGATIVE

## 2016-10-04 SURGERY — RIGHT/LEFT HEART CATH AND CORONARY ANGIOGRAPHY
Anesthesia: LOCAL

## 2016-10-04 MED ORDER — EPINEPHRINE PF 1 MG/ML IJ SOLN
0.0000 ug/min | INTRAVENOUS | Status: DC
  Filled 2016-10-04: qty 4

## 2016-10-04 MED ORDER — SODIUM CHLORIDE 0.9 % IV SOLN
INTRAVENOUS | Status: DC
  Filled 2016-10-04: qty 30

## 2016-10-04 MED ORDER — HEPARIN (PORCINE) IN NACL 100-0.45 UNIT/ML-% IJ SOLN
800.0000 [IU]/h | INTRAMUSCULAR | Status: DC
  Administered 2016-10-04: 800 [IU]/h via INTRAVENOUS
  Filled 2016-10-04: qty 250

## 2016-10-04 MED ORDER — IOPAMIDOL (ISOVUE-370) INJECTION 76%
INTRAVENOUS | Status: DC | PRN
  Administered 2016-10-04: 70 mL via INTRA_ARTERIAL

## 2016-10-04 MED ORDER — FENTANYL CITRATE (PF) 100 MCG/2ML IJ SOLN
INTRAMUSCULAR | Status: DC | PRN
  Administered 2016-10-04: 25 ug via INTRAVENOUS

## 2016-10-04 MED ORDER — ALBUTEROL SULFATE (2.5 MG/3ML) 0.083% IN NEBU
2.5000 mg | INHALATION_SOLUTION | Freq: Once | RESPIRATORY_TRACT | Status: AC
  Administered 2016-10-04: 2.5 mg via RESPIRATORY_TRACT

## 2016-10-04 MED ORDER — DIAZEPAM 2 MG PO TABS
2.0000 mg | ORAL_TABLET | Freq: Once | ORAL | Status: AC
  Administered 2016-10-05: 2 mg via ORAL
  Filled 2016-10-04: qty 1

## 2016-10-04 MED ORDER — MAGNESIUM SULFATE 50 % IJ SOLN
40.0000 meq | INTRAMUSCULAR | Status: DC
  Filled 2016-10-04: qty 10

## 2016-10-04 MED ORDER — SODIUM CHLORIDE 0.9% FLUSH: 3.0000 mL | INTRAVENOUS | Status: DC | PRN

## 2016-10-04 MED ORDER — TRANEXAMIC ACID 1000 MG/10ML IV SOLN
1.5000 mg/kg/h | INTRAVENOUS | Status: AC
  Administered 2016-10-05: 1.5 mg/kg/h via INTRAVENOUS
  Filled 2016-10-04: qty 25

## 2016-10-04 MED ORDER — LIDOCAINE HCL 1 % IJ SOLN
INTRAMUSCULAR | Status: AC
  Filled 2016-10-04: qty 20

## 2016-10-04 MED ORDER — ENSURE ENLIVE PO LIQD
237.0000 mL | Freq: Two times a day (BID) | ORAL | Status: DC
  Administered 2016-10-06 – 2016-10-12 (×12): 237 mL via ORAL

## 2016-10-04 MED ORDER — SODIUM CHLORIDE 0.9 % IV SOLN: 250.0000 mL | INTRAVENOUS | Status: DC | PRN

## 2016-10-04 MED ORDER — SODIUM CHLORIDE 0.9 % IV SOLN
INTRAVENOUS | Status: DC
  Administered 2016-10-04: 08:00:00 via INTRAVENOUS

## 2016-10-04 MED ORDER — CHLORHEXIDINE GLUCONATE CLOTH 2 % EX PADS
6.0000 | MEDICATED_PAD | Freq: Once | CUTANEOUS | Status: AC
  Administered 2016-10-05: 6 via TOPICAL

## 2016-10-04 MED ORDER — ACETAMINOPHEN 325 MG PO TABS: 650.0000 mg | ORAL_TABLET | ORAL | Status: DC | PRN

## 2016-10-04 MED ORDER — FENTANYL CITRATE (PF) 100 MCG/2ML IJ SOLN
INTRAMUSCULAR | Status: AC
  Filled 2016-10-04: qty 2

## 2016-10-04 MED ORDER — PNEUMOCOCCAL VAC POLYVALENT 25 MCG/0.5ML IJ INJ: 0.5000 mL | INJECTION | INTRAMUSCULAR | Status: DC | PRN

## 2016-10-04 MED ORDER — SODIUM CHLORIDE 0.9 % IV SOLN
INTRAVENOUS | Status: AC
  Administered 2016-10-05: 1 [IU]/h via INTRAVENOUS
  Filled 2016-10-04: qty 1

## 2016-10-04 MED ORDER — SODIUM CHLORIDE 0.9% FLUSH: 3.0000 mL | Freq: Two times a day (BID) | INTRAVENOUS | Status: DC

## 2016-10-04 MED ORDER — HEPARIN (PORCINE) IN NACL 2-0.9 UNIT/ML-% IJ SOLN
INTRAMUSCULAR | Status: AC
  Filled 2016-10-04: qty 1000

## 2016-10-04 MED ORDER — ALBUTEROL SULFATE (2.5 MG/3ML) 0.083% IN NEBU: 2.5000 mg | INHALATION_SOLUTION | Freq: Four times a day (QID) | RESPIRATORY_TRACT | Status: DC | PRN

## 2016-10-04 MED ORDER — VERAPAMIL HCL 2.5 MG/ML IV SOLN
INTRAVENOUS | Status: AC
  Filled 2016-10-04: qty 2

## 2016-10-04 MED ORDER — DEXTROSE 5 % IV SOLN
1.5000 g | INTRAVENOUS | Status: AC
Start: 2016-10-05 — End: 2016-10-05
  Administered 2016-10-05: .75 g via INTRAVENOUS
  Administered 2016-10-05: 1.5 g via INTRAVENOUS
  Filled 2016-10-04: qty 1.5

## 2016-10-04 MED ORDER — LIDOCAINE HCL (PF) 1 % IJ SOLN
INTRAMUSCULAR | Status: DC | PRN
  Administered 2016-10-04: 2 mL
  Administered 2016-10-04: 15 mL
  Administered 2016-10-04: 2 mL

## 2016-10-04 MED ORDER — TAMSULOSIN HCL 0.4 MG PO CAPS
0.4000 mg | ORAL_CAPSULE | Freq: Every evening | ORAL | Status: DC
  Administered 2016-10-04 – 2016-10-11 (×7): 0.4 mg via ORAL
  Filled 2016-10-04 (×7): qty 1

## 2016-10-04 MED ORDER — TRANEXAMIC ACID (OHS) BOLUS VIA INFUSION
15.0000 mg/kg | INTRAVENOUS | Status: AC
  Administered 2016-10-05: 876 mg via INTRAVENOUS
  Filled 2016-10-04: qty 876

## 2016-10-04 MED ORDER — IPRATROPIUM BROMIDE 0.02 % IN SOLN
0.5000 mg | Freq: Every day | RESPIRATORY_TRACT | Status: DC | PRN
  Administered 2016-10-06: 0.5 mg via RESPIRATORY_TRACT
  Filled 2016-10-04: qty 2.5

## 2016-10-04 MED ORDER — IOPAMIDOL (ISOVUE-370) INJECTION 76%
INTRAVENOUS | Status: AC
  Filled 2016-10-04: qty 100

## 2016-10-04 MED ORDER — ASPIRIN 81 MG PO CHEW: 81.0000 mg | CHEWABLE_TABLET | ORAL | Status: DC

## 2016-10-04 MED ORDER — PANTOPRAZOLE SODIUM 40 MG PO TBEC
40.0000 mg | DELAYED_RELEASE_TABLET | Freq: Every day | ORAL | Status: DC
  Administered 2016-10-04: 40 mg via ORAL
  Filled 2016-10-04: qty 1

## 2016-10-04 MED ORDER — VANCOMYCIN HCL 10 G IV SOLR
1250.0000 mg | INTRAVENOUS | Status: AC
  Administered 2016-10-05: 1250 mg via INTRAVENOUS
  Filled 2016-10-04: qty 1250

## 2016-10-04 MED ORDER — DEXTROSE 5 % IV SOLN
750.0000 mg | INTRAVENOUS | Status: DC
  Filled 2016-10-04: qty 750

## 2016-10-04 MED ORDER — ASPIRIN 81 MG PO CHEW: 81.0000 mg | CHEWABLE_TABLET | Freq: Every day | ORAL | Status: DC

## 2016-10-04 MED ORDER — ONDANSETRON HCL 4 MG/2ML IJ SOLN: 4.0000 mg | Freq: Four times a day (QID) | INTRAMUSCULAR | Status: DC | PRN

## 2016-10-04 MED ORDER — BISACODYL 5 MG PO TBEC
5.0000 mg | DELAYED_RELEASE_TABLET | Freq: Once | ORAL | Status: AC
  Administered 2016-10-05: 5 mg via ORAL
  Filled 2016-10-04: qty 1

## 2016-10-04 MED ORDER — DOPAMINE-DEXTROSE 3.2-5 MG/ML-% IV SOLN
0.0000 ug/kg/min | INTRAVENOUS | Status: AC
  Administered 2016-10-05: 3 ug/kg/min via INTRAVENOUS
  Filled 2016-10-04: qty 250

## 2016-10-04 MED ORDER — TRANEXAMIC ACID (OHS) PUMP PRIME SOLUTION
2.0000 mg/kg | INTRAVENOUS | Status: DC
Start: 2016-10-05 — End: 2016-10-05
  Filled 2016-10-04: qty 1.17

## 2016-10-04 MED ORDER — HEPARIN SODIUM (PORCINE) 1000 UNIT/ML IJ SOLN
INTRAMUSCULAR | Status: AC
  Filled 2016-10-04: qty 1

## 2016-10-04 MED ORDER — ASPIRIN EC 81 MG PO TBEC: 162.0000 mg | DELAYED_RELEASE_TABLET | Freq: Every day | ORAL | Status: DC

## 2016-10-04 MED ORDER — POTASSIUM CHLORIDE 2 MEQ/ML IV SOLN
80.0000 meq | INTRAVENOUS | Status: DC
  Filled 2016-10-04: qty 40

## 2016-10-04 MED ORDER — SODIUM CHLORIDE 0.9% FLUSH
3.0000 mL | Freq: Two times a day (BID) | INTRAVENOUS | Status: DC
  Administered 2016-10-04: 3 mL via INTRAVENOUS

## 2016-10-04 MED ORDER — HEPARIN (PORCINE) IN NACL 2-0.9 UNIT/ML-% IJ SOLN
INTRAMUSCULAR | Status: AC | PRN
Start: 2016-10-04 — End: 2016-10-04
  Administered 2016-10-04: 1000 mL

## 2016-10-04 MED ORDER — MIDAZOLAM HCL 2 MG/2ML IJ SOLN
INTRAMUSCULAR | Status: AC
  Filled 2016-10-04: qty 2

## 2016-10-04 MED ORDER — CHLORHEXIDINE GLUCONATE 0.12 % MT SOLN
15.0000 mL | Freq: Once | OROMUCOSAL | Status: AC
  Administered 2016-10-05: 15 mL via OROMUCOSAL
  Filled 2016-10-04: qty 15

## 2016-10-04 MED ORDER — METOPROLOL SUCCINATE ER 25 MG PO TB24
12.5000 mg | ORAL_TABLET | Freq: Every day | ORAL | Status: DC
  Administered 2016-10-04 – 2016-10-05 (×2): 12.5 mg via ORAL
  Filled 2016-10-04 (×2): qty 1

## 2016-10-04 MED ORDER — NITROGLYCERIN IN D5W 200-5 MCG/ML-% IV SOLN
2.0000 ug/min | INTRAVENOUS | Status: AC
  Administered 2016-10-05: 16 ug/min via INTRAVENOUS
  Filled 2016-10-04: qty 250

## 2016-10-04 MED ORDER — DEXMEDETOMIDINE HCL IN NACL 400 MCG/100ML IV SOLN
0.1000 ug/kg/h | INTRAVENOUS | Status: AC
Start: 2016-10-05 — End: 2016-10-05
  Administered 2016-10-05: 0.7 ug/kg/h via INTRAVENOUS
  Filled 2016-10-04: qty 100

## 2016-10-04 MED ORDER — MIDAZOLAM HCL 2 MG/2ML IJ SOLN
INTRAMUSCULAR | Status: DC | PRN
  Administered 2016-10-04: 1 mg via INTRAVENOUS

## 2016-10-04 MED ORDER — SODIUM CHLORIDE 0.9 % IV SOLN
30.0000 ug/min | INTRAVENOUS | Status: DC
  Filled 2016-10-04: qty 2

## 2016-10-04 MED ORDER — PLASMA-LYTE 148 IV SOLN
INTRAVENOUS | Status: AC
  Administered 2016-10-05: 500 mL
  Filled 2016-10-04: qty 2.5

## 2016-10-04 MED ORDER — SODIUM CHLORIDE 0.9% FLUSH
3.0000 mL | INTRAVENOUS | Status: DC | PRN
Start: 2016-10-04 — End: 2016-10-04

## 2016-10-04 MED ORDER — SODIUM CHLORIDE 0.9 % IV SOLN: INTRAVENOUS | Status: AC

## 2016-10-04 MED ORDER — TEMAZEPAM 15 MG PO CAPS: 15.0000 mg | ORAL_CAPSULE | Freq: Once | ORAL | Status: DC | PRN

## 2016-10-04 SURGICAL SUPPLY — 17 items
CATH 5FR JL3.5 JR4 ANG PIG MP (CATHETERS) ×2 IMPLANT
CATH BALLN WEDGE 5F 110CM (CATHETERS) ×2 IMPLANT
DEVICE RAD COMP TR BAND LRG (VASCULAR PRODUCTS) ×2 IMPLANT
ELECT DEFIB PAD ADLT CADENCE (PAD) ×2 IMPLANT
GLIDESHEATH SLEND SS 6F .021 (SHEATH) ×2 IMPLANT
GUIDEWIRE .025 260CM (WIRE) ×2 IMPLANT
GUIDEWIRE ANGLED .035X260CM (WIRE) ×2 IMPLANT
GUIDEWIRE INQWIRE 1.5J.035X260 (WIRE) ×1 IMPLANT
INQWIRE 1.5J .035X260CM (WIRE) ×2
KIT HEART LEFT (KITS) ×2 IMPLANT
PACK CARDIAC CATHETERIZATION (CUSTOM PROCEDURE TRAY) ×2 IMPLANT
SHEATH GLIDE SLENDER 4/5FR (SHEATH) ×2 IMPLANT
SHEATH PINNACLE 5F 10CM (SHEATH) ×2 IMPLANT
TRANSDUCER W/STOPCOCK (MISCELLANEOUS) ×2 IMPLANT
TUBING CIL FLEX 10 FLL-RA (TUBING) ×2 IMPLANT
WIRE ASAHI PROWATER 180CM (WIRE) ×2 IMPLANT
WIRE HI TORQ VERSACORE-J 145CM (WIRE) ×2 IMPLANT

## 2016-10-04 NOTE — Progress Notes (Signed)
TR BAND REMOVAL  LOCATION:    Radial rt  DEFLATED PER PROTOCOL:   yes  TIME BAND OFF / DRESSING APPLIED:    1415, gauze and tegaderm  SITE UPON ARRIVAL:    Level 0  SITE AFTER BAND REMOVAL:    Level 0, faint bruising  CIRCULATION SENSATION AND MOVEMENT:    Within Normal Limits : yes, radial pulse 2+  COMMENTS:

## 2016-10-04 NOTE — Consult Note (Signed)
Reason for Consult:Left main/ 3 vessel CAD Referring Physician: Dr. Irish Barton [name]/ Primary- Dr. Hazeline Barton is an 71 y.o. male.  HPI: 75 presents with chest pain and shortness of breath.  Devin Barton is a 71 yo man with a past history of heavy tobacco abuse (60+ pack years, now 1 ppd) and COPD (FEV1= 1.29 in 2012). He also has unexplained weight loss dating back several years and prostate enlargement per his wife. He has no prior history of CAD.  Ad[facility] to [facility] with CP on 6/1. Treated with bronchodilators for possible COPD exacerbation. Had an elevated troponin and BNP but no cardiac w/u done. Saw Dr. Percival Barton on 6/20. Lexiscan myoview and echo ordered. Lexiscan was high risk with significant scar EF 30-44%. Echo showed severe LV dysfunction, mild MR, mild TR. Over the past week he has been awakened several times a night with chest pressure and shortness of breath. He also has symptoms with even minimal activity.  Zubrod Score: At the time of surgery this patient's most appropriate activity status/level should be described as: []     0    Normal activity, no symptoms []     1    Restricted in physical strenuous activity but ambulatory, able to do out light work [x]     2    Ambulatory and capable of self care, unable to do work activities, up and about >50 % of waking hours                              []     3    Only limited self care, in bed greater than 50% of waking hours []     4    Completely disabled, no self care, confined to bed or chair []     5    Moribund   Past Medical History:  Diagnosis Date  . Asthma   . Cigarette smoker   . COPD (chronic obstructive pulmonary disease) ([place])     Past Surgical History:  Procedure Laterality Date  . ABDOMINAL SURGERY     Bowel perf secondary to trauma  . FOOT SURGERY    . HERNIA REPAIR     3 different times    Family History  Problem Relation Age of Onset  . Heart disease Mother        No details  .  Alzheimer's disease Mother   . Atopy Neg Hx     Social History:  reports that he has been smoking Cigarettes.  He has a 30.00 pack-year smoking history. He has never used smokeless tobacco. He reports that he does not drink alcohol or use drugs.  Allergies: No Known Allergies  Medications:  Prior to Admission:  Prescriptions Prior to Admission  Medication Sig Dispense Refill Last Dose  . albuterol (PROAIR HFA) 108 (90 BASE) MCG/ACT inhaler Inhale 2 puffs into the lungs every 6 (six) hours as needed for wheezing or shortness of breath.   10/04/2016 at 0600  . aspirin EC 81 MG tablet Take 162 mg by mouth daily.    10/04/2016 at 0600  . ibuprofen (ADVIL,MOTRIN) 200 MG tablet Take 400 mg by mouth daily as needed for moderate pain.     Marland Kitchen ipratropium (ATROVENT) 0.02 % nebulizer solution Take 0.5 mg by nebulization daily as needed for wheezing or shortness of breath.    10/04/2016 at 0600  . metoprolol succinate (TOPROL XL) 25 MG 24 hr tablet Take  0.5 tablets (12.5 mg total) by mouth daily. 45 tablet 1 10/03/2016 at 2200  . omeprazole (PRILOSEC) 40 MG capsule Take 40 mg by mouth daily.   10/03/2016 at 2200  . tamsulosin (FLOMAX) 0.4 MG CAPS Take 0.4 mg by mouth every evening.   10/03/2016 at 2200    Results for orders placed or performed during the hospital encounter of 10/04/16 (from the past 48 hour(s))  Basic metabolic panel     Status: Abnormal   Collection Time: 10/04/16  8:00 AM  Result Value Ref Range   Sodium 133 (L) 135 - 145 mmol/L   Potassium 4.1 3.5 - 5.1 mmol/L   Chloride 100 (L) 101 - 111 mmol/L   CO2 26 22 - 32 mmol/L   Glucose, Bld 87 65 - 99 mg/dL   BUN 9 6 - 20 mg/dL   Creatinine, Ser 0.99 0.61 - 1.24 mg/dL   Calcium 9.2 8.9 - 10.3 mg/dL   GFR calc non Af Amer >60 >60 mL/min   GFR calc Af Amer >60 >60 mL/min    Comment: (NOTE) The eGFR has been calculated using the CKD EPI equation. This calculation has not been validated in all clinical situations. eGFR's persistently <60  mL/min signify possible Chronic Kidney Disease.    Anion gap 7 5 - 15  CBC     Status: None   Collection Time: 10/04/16  8:00 AM  Result Value Ref Range   WBC 6.2 4.0 - 10.5 K/uL   RBC 4.52 4.22 - 5.81 MIL/uL   Hemoglobin 13.9 13.0 - 17.0 g/dL   HCT 42.3 39.0 - 52.0 %   MCV 93.6 78.0 - 100.0 fL   MCH 30.8 26.0 - 34.0 pg   MCHC 32.9 30.0 - 36.0 g/dL   RDW 14.2 11.5 - 15.5 %   Platelets 319 150 - 400 K/uL  Protime-INR     Status: None   Collection Time: 10/04/16  8:00 AM  Result Value Ref Range   Prothrombin Time 13.1 11.4 - 15.2 seconds   INR 0.99     No results found.  Review of Systems  Constitutional: Positive for malaise/fatigue and weight loss (not recent). Negative for chills and fever.  Eyes: Negative for blurred vision and double vision.  Respiratory: Positive for cough, shortness of breath and wheezing. Negative for hemoptysis.   Cardiovascular: Positive for chest pain. Negative for orthopnea and leg swelling.  Gastrointestinal: Negative for nausea and vomiting.  Genitourinary: Positive for frequency and urgency.  Neurological: Negative for dizziness, seizures and loss of consciousness.  All other systems reviewed and are negative.  Blood pressure 125/71, pulse 67, temperature (!) 97.5 F (36.4 C), temperature source Oral, resp. rate 18, height 5' 8"  (1.727 m), weight 137 lb (62.1 kg), SpO2 95 %. Physical Exam  Vitals reviewed. Constitutional: He is oriented to person, place, and time. No distress.  Thin with thenar and temporal wasting  HENT:  Head: Normocephalic and atraumatic.  Mouth/Throat: No oropharyngeal exudate.  Eyes: Conjunctivae and EOM are normal. No scleral icterus.  Neck: No thyromegaly present.  No bruits  Cardiovascular: Normal rate, regular rhythm and intact distal pulses.  Exam reveals gallop.   No murmur heard. Respiratory: Effort normal. No respiratory distress. He has no wheezes. He has no rales.  Diminished BS bilaterally, no wheezing at  present  GI: Soft. He exhibits no distension. There is no tenderness.  Musculoskeletal: He exhibits no edema.  Lymphadenopathy:    He has no cervical adenopathy.  Neurological:  He is alert and oriented to person, place, and time. No cranial nerve deficit.  Motor grossly intact  Skin: Skin is warm and dry.    Lexiscan Myoview Study Result    There was no ST segment deviation noted during stress.  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 High risk based on large scar burden and decreased LVEF. There is no significant myocardium currently at jeopardy. Consider correlating LVEF with echo.  The left ventricular ejection fraction is moderately decreased (30-44%).  ECHO Study Conclusions  - Left ventricle: The cavity size was normal. Wall thickness was   increased in a pattern of mild LVH. Systolic function was   severely reduced. The estimated ejection fraction was in the   range of 25% to 30%. Abnormal global longitudinal strain of   -11.3%. Diffuse hypokinesis. There is severe hypokinesis of the   anteroseptal and apical myocardium. Doppler parameters are   consistent with abnormal left ventricular relaxation (grade 1   diastolic dysfunction). - Ventricular septum: Septal motion showed abnormal function and   dyssynergy. - Aortic valve: Mildly calcified annulus. Trileaflet; mildly   calcified leaflets. Valve area (Vmax): 1.89 cm^2. - Mitral valve: Calcified annulus. Mildly thickened leaflets .   There was mild regurgitation. - Right atrium: Central venous pressure (est): 3 mm Hg. - Atrial septum: No defect or patent foramen ovale was identified. - Tricuspid valve: There was trivial regurgitation. - Pulmonary arteries: PA peak pressure: 19 mm Hg (S). - Pericardium, extracardiac: There was no  pericardial effusion.  Impressions:  - Mild LVH with LVEF approximately 25-30%. There is diffuse   hypokinesis with septal dyssynergy and hypokinesis of the   anteroseptal and apical myocardium. 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 Grade 1 diastolic dysfunction. No definite LV mural   thrombus but images are somewhat limited, could consider a   Definity contrast study. Mildly calcified mitral annulus with   mildly thickened leaflets and mild mitral regurgitation. Mildly   sclerotic aortic valve. Trivial tricuspid regurgitation with PASP   estimated 19 mmHg.  CARDIAC CATHETERIZATION Conclusion     Prox RCA lesion, 100 %stenosed.  LM lesion, 75 %stenosed.  Ost 1st Mrg lesion, 75 %stenosed.  Ost LAD to Prox LAD lesion, 90 %stenosed.  Mid LAD lesion, 75 %stenosed.  LV end diastolic pressure is low.  There is no aortic valve stenosis.  LV end diastolic pressure is normal.  Normal PA pressures. Ao Sat 90%. PA sat 61%. CO 4.2 L.min; CI 2.4  Right radial loop.   Severe three vessel CAD.  Known LV dysfunction from noninvasive testing, but well compensated.  Due to high risk anatomy, will admit the patient and start IV heparin.  Plan for cardiac surgery consult.    I personally reviewed the cath images and concur with the findings noted above  Assessment/Plan: 71 yo man with a history of heavy tobacco abuse and COPD who presents with an unstable coronary syndrome. High risk nuclear study, echo showed severe LV dysfunction. 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   CABG indicated for survival benefit and relief of symptoms.   High risk secondary to severe COPD and moderate to severe protein calorie malnutrition.  I have discussed the general nature of the procedure, the need for general anesthesia, the use of cardiopulmonary bypass, and the incisions to be used with  Devin Barton and his family. We discussed the expected hospital stay, overall recovery and short and long  term outcomes. I informed them of the indications, risks, benefits and alternatives. They understand the risks include, but are not limited to death, stroke, MI, DVT/PE, bleeding, possible need for transfusion, infections,other organ system dysfunction including respiratory, renal, or GI complications. They understand he is high risk for respiratory complications.   He accepts the risks and agrees to proceed.  For CABG in AM  Melrose Nakayama 10/04/2016, 2:34 PM

## 2016-10-04 NOTE — Progress Notes (Signed)
Pre-op Cardiac Surgery  Carotid Findings: 1-39% right ICA stenosis.  60-79% left ICA stenosis (224/83).  Bilateral vertebral artery flow is antegrade.   Upper Extremity Right Left  Brachial Pressures 113T 133T  Radial Waveforms B T  Ulnar Waveforms T T  Palmar Arch (Allen's Test) Doppler signal obliterates with both radial and ulnar compression Doppler signal remains normal with radial compression and obliterates with ulnar compression   Findings:      Lower  Extremity Right Left  Dorsalis Pedis    Anterior Tibial Biphasic Biphasic  Posterior Tibial Bipahsic Biphasic  Ankle/Brachial Indices      Findings:

## 2016-10-04 NOTE — H&P (View-Only) (Signed)
Clinical Summary Mr. [name] is a 71 y.o.male last seen by Dr Antoine PocheHochrein, this is our first visit together.  1. Chest pain/ acutesystolic HF - [facility] to [facility] - EKG with LBBB thought to be chronic - treated for COPD exacerbation with plans for outpatient cardiology evaluation - seen by Dr Antoine PocheHochrein, [facility] 08/2016 Lexiscan: large area of scar as reported below, no significant ischemia. LVEF 30-44%. High risk due to low LVEF and scar - 08/2016: LVEF 25-30%,  severe hypokinesis of the anteroseptal and apical myocardium. No definite LV mural   thrombus but images are somewhat limited, could consider a   Definity contrast study.  - still with SOB. Example walking [place] [employer]. No edema. No recent chest pain  2. COPD - followed by pcp - last seen by Dr Sherene SiresWert 07/2007. Notes mention severe COPD. Had been on advair but no longer taking, only has rescue inhaler  Past Medical History:  Diagnosis Date  . Asthma   . Cigarette smoker   . COPD (chronic obstructive pulmonary disease) (HCC)      No Known Allergies   Current Outpatient Prescriptions  Medication Sig Dispense Refill  . albuterol (PROAIR HFA) 108 (90 BASE) MCG/ACT inhaler Inhale 2 puffs into the lungs every 6 (six) hours as needed for wheezing or shortness of breath.    Marland Kitchen. albuterol (PROVENTIL) (2.5 MG/3ML) 0.083% nebulizer solution Take 2.5 mg by nebulization every 6 (six) hours as needed for wheezing or shortness of breath.    Marland Kitchen. aspirin EC 81 MG tablet Take 162 mg by mouth daily.     . ciprofloxacin (CIPRO) 500 MG tablet Take 500 mg by mouth 2 (two) times daily.    Marland Kitchen. omeprazole (PRILOSEC) 40 MG capsule Take 40 mg by mouth daily.    . tamsulosin (FLOMAX) 0.4 MG CAPS Take 0.4 mg by mouth every evening.     No current facility-administered medications for this visit.      Past Surgical History:  Procedure Laterality Date  . ABDOMINAL SURGERY     Bowel perf secondary to trauma  . FOOT  SURGERY    . HERNIA REPAIR     3 different times     No Known Allergies    Family History  Problem Relation Age of Onset  . Heart disease Mother        No details  . Alzheimer's disease Mother   . Atopy Neg Hx      Social History Mr. [name] reports that he has been smoking Cigarettes.  He has a 30.00 pack-year smoking history. He has never used smokeless tobacco. Mr. [name] reports that he does not drink alcohol.   Review of Systems CONSTITUTIONAL: No weight loss, fever, chills, weakness or fatigue.  HEENT: Eyes: No visual loss, blurred vision, double vision or yellow sclerae.No hearing loss, sneezing, congestion, runny nose or sore throat.  SKIN: No rash or itching.  CARDIOVASCULAR: per hpi RESPIRATORY: No shortness of breath, cough or sputum.  GASTROINTESTINAL: No anorexia, nausea, vomiting or diarrhea. No abdominal pain or blood.  GENITOURINARY: No burning on urination, no polyuria NEUROLOGICAL: No headache, dizziness, syncope, paralysis, ataxia, numbness or tingling in the extremities. No change in bowel or bladder control.  MUSCULOSKELETAL: No muscle, back pain, joint pain or stiffness.  LYMPHATICS: No enlarged nodes. No history of splenectomy.  PSYCHIATRIC: No history of depression or anxiety.  ENDOCRINOLOGIC: No reports of sweating, cold or heat intolerance. No polyuria or polydipsia.  .Marland Kitchen  Physical Examination Vitals:   09/28/16 1451  BP: 118/62  Pulse: 78   Vitals:   09/28/16 1451  Weight: 129 lb (58.5 kg)  Height: 5\' 8"  (1.727 m)    Gen: resting comfortably, no acute distress HEENT: no scleral icterus, pupils equal round and reactive, no palptable cervical adenopathy,  CV: RRR, n m/r/g, no jvd Resp: Clear to auscultation bilaterally GI: abdomen is soft, non-tender, non-distended, normal bowel sounds, no hepatosplenomegaly MSK: extremities are warm, no edema.  Skin: warm, no rash Neuro:  no focal deficits Psych: appropriate affect   Diagnostic  Studies 08/2016 Lexiscan MPI  There was no ST segment deviation noted during stress.  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 High risk based on large scar burden and decreased LVEF. There is no significant myocardium currently at jeopardy. Consider correlating LVEF with echo.  The left ventricular ejection fraction is moderately decreased (30-44%).   08/2016 echo Study Conclusions  - Left ventricle: The cavity size was normal. Wall thickness was   increased in a pattern of mild LVH. Systolic function was   severely reduced. The estimated ejection fraction was in the   range of 25% to 30%. Abnormal global longitudinal strain of   -11.3%. Diffuse hypokinesis. There is severe hypokinesis of the   anteroseptal and apical myocardium. Doppler parameters are   consistent with abnormal left ventricular relaxation (grade 1   diastolic dysfunction). - Ventricular septum: Septal motion showed abnormal function and   dyssynergy. - Aortic valve: Mildly calcified annulus. Trileaflet; mildly   calcified leaflets. Valve area (Vmax): 1.89 cm^2. - Mitral valve: Calcified annulus. Mildly thickened leaflets .   There was mild regurgitation. - Right atrium: Central venous pressure (est): 3 mm Hg. - Atrial septum: No defect or patent foramen ovale was identified. - Tricuspid valve: There was trivial regurgitation. - Pulmonary arteries: PA peak pressure: 19 mm Hg (S). - Pericardium, extracardiac: There was no pericardial effusion.  Impressions:  - Mild LVH with LVEF approximately 25-30%. There is diffuse   hypokinesis with septal dyssynergy and hypokinesis of the   anteroseptal and apical myocardium. 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 Grade 1 diastolic dysfunction. No definite LV mural   thrombus but images are somewhat  limited, could consider a   Definity contrast study. Mildly calcified mitral annulus with   mildly thickened leaflets and mild mitral regurgitation. Mildly   sclerotic aortic valve. Trivial tricuspid regurgitation with PASP   estimated 19 mmHg.    Assessment and Plan  1. Chest pain/acute systolic HF - patient with LVEF 25-30%, chronic LBBB, recent symptoms of chest pain and SOB - we will refer for LHC/RHC in setting of newly diagnosed systolic dysfunction. If confirmed CAD will need statin.  - start ToproL XL12.5mg  daily, further med adjustments after cath  2. COPD - refer to Dr Juanetta GoslingHawkins  F/u 2-3 weeks    Antoine PocheJonathan F. Annsley Akkerman, M.D

## 2016-10-04 NOTE — Progress Notes (Addendum)
ANTICOAGULATION CONSULT NOTE - Initial Consult  Pharmacy Consult for heparin Indication: chest pain/ACS  No Known Allergies  Patient Measurements: Height: 5\' 8"  (172.7 cm) Weight: 128 lb 12 oz (58.4 kg) IBW/kg (Calculated) : 68.4   Vital Signs: Temp: 99.2 F (37.3 C) (07/12 1506) Temp Source: Oral (07/12 1506) BP: 127/66 (07/12 1506) Pulse Rate: 67 (07/12 1400)  Labs:  Recent Labs  10/04/16 0800  HGB 13.9  HCT 42.3  PLT 319  LABPROT 13.1  INR 0.99  CREATININE 0.99    Estimated Creatinine Clearance: 57.4 mL/min (by C-G formula based on SCr of 0.99 mg/dL).   Medical History: Past Medical History:  Diagnosis Date  . Asthma   . Cigarette smoker   . COPD (chronic obstructive pulmonary disease) (HCC)     Medications:  Prescriptions Prior to Admission  Medication Sig Dispense Refill Last Dose  . albuterol (PROAIR HFA) 108 (90 BASE) MCG/ACT inhaler Inhale 2 puffs into the lungs every 6 (six) hours as needed for wheezing or shortness of breath.   10/04/2016 at 0600  . aspirin EC 81 MG tablet Take 162 mg by mouth daily.    10/04/2016 at 0600  . ibuprofen (ADVIL,MOTRIN) 200 MG tablet Take 400 mg by mouth daily as needed for moderate pain.     Marland Kitchen. ipratropium (ATROVENT) 0.02 % nebulizer solution Take 0.5 mg by nebulization daily as needed for wheezing or shortness of breath.    10/04/2016 at 0600  . metoprolol succinate (TOPROL XL) 25 MG 24 hr tablet Take 0.5 tablets (12.5 mg total) by mouth daily. 45 tablet 1 10/03/2016 at 2200  . omeprazole (PRILOSEC) 40 MG capsule Take 40 mg by mouth daily.   10/03/2016 at 2200  . tamsulosin (FLOMAX) 0.4 MG CAPS Take 0.4 mg by mouth every evening.   10/03/2016 at 2200   Scheduled:  . albuterol  2.5 mg Nebulization Once  . [START ON 10/05/2016] aspirin EC  162 mg Oral Daily  . metoprolol succinate  12.5 mg Oral Daily  . pantoprazole  40 mg Oral Daily  . sodium chloride flush  3 mL Intravenous Q12H  . tamsulosin  0.4 mg Oral QPM     Assessment: 71 yo male s/p cath with 3VCAD with plans for CABG on 10/05/16. Heparin to start 8 hrs post sheath removal (removed at about 12pm).  Goal of Therapy:  Heparin level 0.3-0.7 units/ml Monitor platelets by anticoagulation protocol: Yes   Plan:  -restart heparin at at units/hr at 8pm at 800 units/hr (stop at 5am for CABG) -No levels as heparin will be off in am  Harland GermanAndrew Maylin [place], Pharm D 10/04/2016 3:23 PM

## 2016-10-04 NOTE — Interval H&P Note (Signed)
Cath Lab Visit (complete for each Cath Lab visit)  Clinical Evaluation Leading to the Procedure:   ACS: No.  Non-ACS:    Anginal Classification: CCS III  Anti-ischemic medical therapy: Minimal Therapy (1 class of medications)  Non-Invasive Test Results: High-risk stress test findings: cardiac mortality >3%/year  Prior CABG: No previous CABG   DOE with walking.  Also with COPD.   History and Physical Interval Note:  10/04/2016 10:38 AM  Davene CostainFrank L Nogueira  has presented today for surgery, with the diagnosis of chronic systolic heart failure  The various methods of treatment have been discussed with the patient and family. After consideration of risks, benefits and other options for treatment, the patient has consented to  Procedure(s): Right/Left Heart Cath and Coronary Angiography (N/A) as a surgical intervention .  The patient's history has been reviewed, patient examined, no change in status, stable for surgery.  I have reviewed the patient's chart and labs.  Questions were answered to the patient's satisfaction.     Lance MussJayadeep Josimar Corning

## 2016-10-05 ENCOUNTER — Inpatient Hospital Stay (HOSPITAL_COMMUNITY): Payer: Medicare HMO

## 2016-10-05 ENCOUNTER — Inpatient Hospital Stay (HOSPITAL_COMMUNITY): Payer: Medicare HMO | Admitting: Certified Registered Nurse Anesthetist

## 2016-10-05 ENCOUNTER — Encounter (HOSPITAL_COMMUNITY): Payer: Self-pay | Admitting: Certified Registered Nurse Anesthetist

## 2016-10-05 ENCOUNTER — Inpatient Hospital Stay (HOSPITAL_COMMUNITY)
Admission: AD | Disposition: A | Discharge status: Home Health | Payer: Self-pay | Source: Ambulatory Visit | Attending: Thoracic Surgery (Cardiothoracic Vascular Surgery)

## 2016-10-05 DIAGNOSIS — I2510 Atherosclerotic heart disease of native coronary artery without angina pectoris: Secondary | ICD-10-CM | POA: Diagnosis present

## 2016-10-05 HISTORY — PX: TEE WITHOUT CARDIOVERSION: SHX5443

## 2016-10-05 HISTORY — PX: CORONARY ARTERY BYPASS GRAFT: SHX141

## 2016-10-05 LAB — GLUCOSE, CAPILLARY
GLUCOSE-CAPILLARY: 109 mg/dL — AB (ref 65–99)
GLUCOSE-CAPILLARY: 111 mg/dL — AB (ref 65–99)
GLUCOSE-CAPILLARY: 114 mg/dL — AB (ref 65–99)
GLUCOSE-CAPILLARY: 117 mg/dL — AB (ref 65–99)
Glucose-Capillary: 125 mg/dL — ABNORMAL HIGH (ref 65–99)
Glucose-Capillary: 119 mg/dL — ABNORMAL HIGH (ref 65–99)

## 2016-10-05 LAB — BASIC METABOLIC PANEL
ANION GAP: 9 (ref 5–15)
BUN: 12 mg/dL (ref 6–20)
CHLORIDE: 103 mmol/L (ref 101–111)
CO2: 21 mmol/L — ABNORMAL LOW (ref 22–32)
Calcium: 8.7 mg/dL — ABNORMAL LOW (ref 8.9–10.3)
Creatinine, Ser: 0.83 mg/dL (ref 0.61–1.24)
GFR calc Af Amer: >60 mL/min (ref >60)
GFR calc non Af Amer: >60 mL/min (ref >60)
Glucose, Bld: 79 mg/dL (ref 65–99)
POTASSIUM: 4.1 mmol/L (ref 3.5–5.1)
SODIUM: 133 mmol/L — AB (ref 135–145)

## 2016-10-05 LAB — POCT I-STAT 3, ART BLOOD GAS (G3+)
ACID-BASE EXCESS: 2 mmol/L (ref 0.0–2.0)
ACID-BASE EXCESS: 3 mmol/L — AB (ref 0.0–2.0)
Acid-Base Excess: 3 mmol/L — ABNORMAL HIGH (ref 0.0–2.0)
Acid-Base Excess: 4 mmol/L — ABNORMAL HIGH (ref 0.0–2.0)
Acid-base deficit: 4 mmol/L — ABNORMAL HIGH (ref 0.0–2.0)
Acid-base deficit: 3 mmol/L — ABNORMAL HIGH (ref 0.0–2.0)
Acid-base deficit: 3 mmol/L — ABNORMAL HIGH (ref 0.0–2.0)
BICARBONATE: 27.2 mmol/L (ref 20.0–28.0)
BICARBONATE: 28.2 mmol/L — AB (ref 20.0–28.0)
BICARBONATE: 25.2 mmol/L (ref 20.0–28.0)
BICARBONATE: 25.5 mmol/L (ref 20.0–28.0)
BICARBONATE: 22.9 mmol/L (ref 20.0–28.0)
Bicarbonate: 23.8 mmol/L (ref 20.0–28.0)
Bicarbonate: 23.4 mmol/L (ref 20.0–28.0)
Bicarbonate: 23.1 mmol/L (ref 20.0–28.0)
O2 SAT: 100 %
O2 SAT: 100 %
O2 SAT: 100 %
O2 Saturation: 100 %
O2 Saturation: 100 %
O2 Saturation: 97 %
O2 Saturation: 97 %
O2 Saturation: 94 %
PCO2 ART: 36.4 mmHg (ref 32.0–48.0)
PCO2 ART: 44.2 mmHg (ref 32.0–48.0)
PCO2 ART: 34.3 mmHg (ref 32.0–48.0)
PCO2 ART: 52 mmHg — AB (ref 32.0–48.0)
PCO2 ART: 47 mmHg (ref 32.0–48.0)
PCO2 ART: 47.9 mmHg (ref 32.0–48.0)
PH ART: 7.414 (ref 7.350–7.450)
PH ART: 7.481 — AB (ref 7.350–7.450)
PH ART: 7.252 — AB (ref 7.350–7.450)
PH ART: 7.303 — AB (ref 7.350–7.450)
PH ART: 7.293 — AB (ref 7.350–7.450)
PO2 ART: 318 mmHg — AB (ref 83.0–108.0)
PO2 ART: 395 mmHg — AB (ref 83.0–108.0)
PO2 ART: 286 mmHg — AB (ref 83.0–108.0)
TCO2: 28 mmol/L (ref 0–100)
TCO2: 30 mmol/L (ref 0–100)
TCO2: 26 mmol/L (ref 0–100)
TCO2: 26 mmol/L (ref 0–100)
TCO2: 25 mmol/L (ref 0–100)
TCO2: 25 mmol/L (ref 0–100)
TCO2: 25 mmol/L (ref 0–100)
TCO2: 24 mmol/L (ref 0–100)
pCO2 arterial: 31.3 mmHg — ABNORMAL LOW (ref 32.0–48.0)
pCO2 arterial: 33.8 mmHg (ref 32.0–48.0)
pH, Arterial: 7.479 — ABNORMAL HIGH (ref 7.350–7.450)
pH, Arterial: 7.519 — ABNORMAL HIGH (ref 7.350–7.450)
pH, Arterial: 7.451 — ABNORMAL HIGH (ref 7.350–7.450)
pO2, Arterial: 289 mmHg — ABNORMAL HIGH (ref 83.0–108.0)
pO2, Arterial: 308 mmHg — ABNORMAL HIGH (ref 83.0–108.0)
pO2, Arterial: 95 mmHg (ref 83.0–108.0)
pO2, Arterial: 103 mmHg (ref 83.0–108.0)
pO2, Arterial: 85 mmHg (ref 83.0–108.0)

## 2016-10-05 LAB — POCT I-STAT, CHEM 8
BUN: 11 mg/dL (ref 6–20)
BUN: 10 mg/dL (ref 6–20)
BUN: 9 mg/dL (ref 6–20)
BUN: 9 mg/dL (ref 6–20)
BUN: 9 mg/dL (ref 6–20)
BUN: 8 mg/dL (ref 6–20)
BUN: 7 mg/dL (ref 6–20)
CALCIUM ION: 1.22 mmol/L (ref 1.15–1.40)
CALCIUM ION: 1.14 mmol/L — AB (ref 1.15–1.40)
CALCIUM ION: 0.95 mmol/L — AB (ref 1.15–1.40)
CHLORIDE: 99 mmol/L — AB (ref 101–111)
CHLORIDE: 104 mmol/L (ref 101–111)
CREATININE: 0.4 mg/dL — AB (ref 0.61–1.24)
CREATININE: 0.4 mg/dL — AB (ref 0.61–1.24)
CREATININE: 0.5 mg/dL — AB (ref 0.61–1.24)
Calcium, Ion: 1.06 mmol/L — ABNORMAL LOW (ref 1.15–1.40)
Calcium, Ion: 1.02 mmol/L — ABNORMAL LOW (ref 1.15–1.40)
Calcium, Ion: 0.91 mmol/L — ABNORMAL LOW (ref 1.15–1.40)
Calcium, Ion: 1.09 mmol/L — ABNORMAL LOW (ref 1.15–1.40)
Chloride: 101 mmol/L (ref 101–111)
Chloride: 96 mmol/L — ABNORMAL LOW (ref 101–111)
Chloride: 100 mmol/L — ABNORMAL LOW (ref 101–111)
Chloride: 101 mmol/L (ref 101–111)
Chloride: 100 mmol/L — ABNORMAL LOW (ref 101–111)
Creatinine, Ser: 0.5 mg/dL — ABNORMAL LOW (ref 0.61–1.24)
Creatinine, Ser: 0.4 mg/dL — ABNORMAL LOW (ref 0.61–1.24)
Creatinine, Ser: 0.3 mg/dL — ABNORMAL LOW (ref 0.61–1.24)
Creatinine, Ser: 0.5 mg/dL — ABNORMAL LOW (ref 0.61–1.24)
GLUCOSE: 111 mg/dL — AB (ref 65–99)
GLUCOSE: 113 mg/dL — AB (ref 65–99)
GLUCOSE: 121 mg/dL — AB (ref 65–99)
Glucose, Bld: 92 mg/dL (ref 65–99)
Glucose, Bld: 88 mg/dL (ref 65–99)
Glucose, Bld: 103 mg/dL — ABNORMAL HIGH (ref 65–99)
Glucose, Bld: 120 mg/dL — ABNORMAL HIGH (ref 65–99)
HCT: 36 % — ABNORMAL LOW (ref 39.0–52.0)
HCT: 26 % — ABNORMAL LOW (ref 39.0–52.0)
HEMATOCRIT: 33 % — AB (ref 39.0–52.0)
HEMATOCRIT: 26 % — AB (ref 39.0–52.0)
HEMATOCRIT: 28 % — AB (ref 39.0–52.0)
HEMATOCRIT: 24 % — AB (ref 39.0–52.0)
HEMATOCRIT: 25 % — AB (ref 39.0–52.0)
HEMOGLOBIN: 12.2 g/dL — AB (ref 13.0–17.0)
HEMOGLOBIN: 8.8 g/dL — AB (ref 13.0–17.0)
HEMOGLOBIN: 8.8 g/dL — AB (ref 13.0–17.0)
HEMOGLOBIN: 8.2 g/dL — AB (ref 13.0–17.0)
HEMOGLOBIN: 8.5 g/dL — AB (ref 13.0–17.0)
Hemoglobin: 11.2 g/dL — ABNORMAL LOW (ref 13.0–17.0)
Hemoglobin: 9.5 g/dL — ABNORMAL LOW (ref 13.0–17.0)
POTASSIUM: 4 mmol/L (ref 3.5–5.1)
POTASSIUM: 3.8 mmol/L (ref 3.5–5.1)
POTASSIUM: 5 mmol/L (ref 3.5–5.1)
POTASSIUM: 4.4 mmol/L (ref 3.5–5.1)
Potassium: 3.8 mmol/L (ref 3.5–5.1)
Potassium: 4.6 mmol/L (ref 3.5–5.1)
Potassium: 4 mmol/L (ref 3.5–5.1)
SODIUM: 136 mmol/L (ref 135–145)
SODIUM: 137 mmol/L (ref 135–145)
SODIUM: 136 mmol/L (ref 135–145)
SODIUM: 137 mmol/L (ref 135–145)
SODIUM: 139 mmol/L (ref 135–145)
Sodium: 134 mmol/L — ABNORMAL LOW (ref 135–145)
Sodium: 134 mmol/L — ABNORMAL LOW (ref 135–145)
TCO2: 27 mmol/L (ref 0–100)
TCO2: 28 mmol/L (ref 0–100)
TCO2: 26 mmol/L (ref 0–100)
TCO2: 24 mmol/L (ref 0–100)
TCO2: 24 mmol/L (ref 0–100)
TCO2: 27 mmol/L (ref 0–100)
TCO2: 23 mmol/L (ref 0–100)

## 2016-10-05 LAB — CREATININE, SERUM: Creatinine, Ser: 0.7 mg/dL (ref 0.61–1.24)

## 2016-10-05 LAB — CBC
HCT: 38.6 % — ABNORMAL LOW (ref 39.0–52.0)
HCT: 26.3 % — ABNORMAL LOW (ref 39.0–52.0)
HEMATOCRIT: 28.8 % — AB (ref 39.0–52.0)
HEMOGLOBIN: 8.6 g/dL — AB (ref 13.0–17.0)
Hemoglobin: 12.9 g/dL — ABNORMAL LOW (ref 13.0–17.0)
Hemoglobin: 9.6 g/dL — ABNORMAL LOW (ref 13.0–17.0)
MCH: 30.6 pg (ref 26.0–34.0)
MCH: 30.6 pg (ref 26.0–34.0)
MCH: 30.4 pg (ref 26.0–34.0)
MCHC: 33.4 g/dL (ref 30.0–36.0)
MCHC: 33.3 g/dL (ref 30.0–36.0)
MCHC: 32.7 g/dL (ref 30.0–36.0)
MCV: 91.7 fL (ref 78.0–100.0)
MCV: 91.7 fL (ref 78.0–100.0)
MCV: 92.9 fL (ref 78.0–100.0)
PLATELETS: 302 10*3/uL (ref 150–400)
PLATELETS: 166 10*3/uL (ref 150–400)
Platelets: 159 10*3/uL (ref 150–400)
RBC: 4.21 MIL/uL — ABNORMAL LOW (ref 4.22–5.81)
RBC: 3.14 MIL/uL — ABNORMAL LOW (ref 4.22–5.81)
RBC: 2.83 MIL/uL — AB (ref 4.22–5.81)
RDW: 13.9 % (ref 11.5–15.5)
RDW: 13.9 % (ref 11.5–15.5)
RDW: 14 % (ref 11.5–15.5)
WBC: 8.7 10*3/uL (ref 4.0–10.5)
WBC: 11.6 10*3/uL — ABNORMAL HIGH (ref 4.0–10.5)
WBC: 9.3 10*3/uL (ref 4.0–10.5)

## 2016-10-05 LAB — URINALYSIS, ROUTINE W REFLEX MICROSCOPIC
BILIRUBIN URINE: NEGATIVE
Glucose, UA: NEGATIVE mg/dL
Hgb urine dipstick: NEGATIVE
KETONES UR: 5 mg/dL — AB
Leukocytes, UA: NEGATIVE
NITRITE: NEGATIVE
Protein, ur: NEGATIVE mg/dL
Specific Gravity, Urine: 1.017 (ref 1.005–1.030)
pH: 6 (ref 5.0–8.0)

## 2016-10-05 LAB — MAGNESIUM: MAGNESIUM: 3.1 mg/dL — AB (ref 1.7–2.4)

## 2016-10-05 LAB — APTT
APTT: 96 s — AB (ref 24–36)
aPTT: 40 seconds — ABNORMAL HIGH (ref 24–36)

## 2016-10-05 LAB — POCT I-STAT 4, (NA,K, GLUC, HGB,HCT)
Glucose, Bld: 120 mg/dL — ABNORMAL HIGH (ref 65–99)
HCT: 27 % — ABNORMAL LOW (ref 39.0–52.0)
HEMOGLOBIN: 9.2 g/dL — AB (ref 13.0–17.0)
Potassium: 3.9 mmol/L (ref 3.5–5.1)
SODIUM: 140 mmol/L (ref 135–145)

## 2016-10-05 LAB — HEMOGLOBIN AND HEMATOCRIT, BLOOD
HEMATOCRIT: 24.2 % — AB (ref 39.0–52.0)
HEMOGLOBIN: 8.2 g/dL — AB (ref 13.0–17.0)

## 2016-10-05 LAB — ECHO TEE: FS: 22 % — AB (ref 28–44)

## 2016-10-05 LAB — PLATELET COUNT: Platelets: 179 10*3/uL (ref 150–400)

## 2016-10-05 LAB — PROTIME-INR
INR: 1.48
Prothrombin Time: 18.1 seconds — ABNORMAL HIGH (ref 11.4–15.2)

## 2016-10-05 LAB — SURGICAL PCR SCREEN
MRSA, PCR: NEGATIVE
STAPHYLOCOCCUS AUREUS: NEGATIVE

## 2016-10-05 LAB — ABO/RH: ABO/RH(D): A POS

## 2016-10-05 SURGERY — CORONARY ARTERY BYPASS GRAFTING (CABG)
Anesthesia: General | Site: Chest

## 2016-10-05 MED ORDER — SODIUM CHLORIDE 0.9 % IV SOLN
INTRAVENOUS | Status: DC
  Filled 2016-10-05: qty 1

## 2016-10-05 MED ORDER — INSULIN ASPART 100 UNIT/ML ~~LOC~~ SOLN: 0.0000 [IU] | SUBCUTANEOUS | Status: DC

## 2016-10-05 MED ORDER — PHENYLEPHRINE HCL 10 MG/ML IJ SOLN
INTRAMUSCULAR | Status: DC | PRN
  Administered 2016-10-05: 40 ug/min via INTRAVENOUS

## 2016-10-05 MED ORDER — CHLORHEXIDINE GLUCONATE 0.12% ORAL RINSE (MEDLINE KIT)
15.0000 mL | Freq: Two times a day (BID) | OROMUCOSAL | Status: DC
  Administered 2016-10-05 – 2016-10-11 (×8): 15 mL via OROMUCOSAL

## 2016-10-05 MED ORDER — MIDAZOLAM HCL 10 MG/2ML IJ SOLN
INTRAMUSCULAR | Status: AC
  Filled 2016-10-05: qty 2

## 2016-10-05 MED ORDER — HEPARIN SODIUM (PORCINE) 1000 UNIT/ML IJ SOLN
INTRAMUSCULAR | Status: AC
  Filled 2016-10-05: qty 1

## 2016-10-05 MED ORDER — FENTANYL CITRATE (PF) 250 MCG/5ML IJ SOLN
INTRAMUSCULAR | Status: DC | PRN
  Administered 2016-10-05: 50 ug via INTRAVENOUS
  Administered 2016-10-05: 100 ug via INTRAVENOUS
  Administered 2016-10-05: 150 ug via INTRAVENOUS
  Administered 2016-10-05: 1150 ug via INTRAVENOUS
  Administered 2016-10-05: 50 ug via INTRAVENOUS

## 2016-10-05 MED ORDER — LACTATED RINGERS IV SOLN
INTRAVENOUS | Status: DC | PRN
  Administered 2016-10-05: 07:00:00 via INTRAVENOUS

## 2016-10-05 MED ORDER — INSULIN REGULAR BOLUS VIA INFUSION
0.0000 [IU] | Freq: Three times a day (TID) | INTRAVENOUS | Status: DC
  Filled 2016-10-05: qty 10

## 2016-10-05 MED ORDER — LIDOCAINE HCL (CARDIAC) 20 MG/ML IV SOLN
INTRAVENOUS | Status: AC
  Filled 2016-10-05: qty 5

## 2016-10-05 MED ORDER — FENTANYL CITRATE (PF) 250 MCG/5ML IJ SOLN
INTRAMUSCULAR | Status: AC
  Filled 2016-10-05: qty 25

## 2016-10-05 MED ORDER — FAMOTIDINE IN NACL 20-0.9 MG/50ML-% IV SOLN
20.0000 mg | Freq: Two times a day (BID) | INTRAVENOUS | Status: AC
  Administered 2016-10-05: 20 mg via INTRAVENOUS

## 2016-10-05 MED ORDER — VANCOMYCIN HCL IN DEXTROSE 1-5 GM/200ML-% IV SOLN
1000.0000 mg | Freq: Once | INTRAVENOUS | Status: AC
  Administered 2016-10-05: 1000 mg via INTRAVENOUS
  Filled 2016-10-05: qty 200

## 2016-10-05 MED ORDER — DOCUSATE SODIUM 100 MG PO CAPS
200.0000 mg | ORAL_CAPSULE | Freq: Every day | ORAL | Status: DC
  Administered 2016-10-06 – 2016-10-12 (×7): 200 mg via ORAL
  Filled 2016-10-05 (×7): qty 2

## 2016-10-05 MED ORDER — SODIUM CHLORIDE 0.9% FLUSH: 10.0000 mL | INTRAVENOUS | Status: DC | PRN

## 2016-10-05 MED ORDER — ROCURONIUM BROMIDE 50 MG/5ML IV SOLN
INTRAVENOUS | Status: AC
  Filled 2016-10-05: qty 4

## 2016-10-05 MED ORDER — ACETAMINOPHEN 650 MG RE SUPP
650.0000 mg | Freq: Once | RECTAL | Status: AC
  Administered 2016-10-05: 650 mg via RECTAL

## 2016-10-05 MED ORDER — SODIUM CHLORIDE 0.9 % IV SOLN
0.0000 ug/kg/h | INTRAVENOUS | Status: DC
  Filled 2016-10-05: qty 2

## 2016-10-05 MED ORDER — ROCURONIUM BROMIDE 50 MG/5ML IV SOLN
INTRAVENOUS | Status: AC
  Filled 2016-10-05: qty 1

## 2016-10-05 MED ORDER — BISACODYL 10 MG RE SUPP: 10.0000 mg | Freq: Every day | RECTAL | Status: DC

## 2016-10-05 MED ORDER — PANTOPRAZOLE SODIUM 40 MG PO TBEC
40.0000 mg | DELAYED_RELEASE_TABLET | Freq: Every day | ORAL | Status: DC
  Administered 2016-10-07 – 2016-10-12 (×6): 40 mg via ORAL
  Filled 2016-10-05 (×6): qty 1

## 2016-10-05 MED ORDER — SODIUM CHLORIDE 0.9 % IJ SOLN
OROMUCOSAL | Status: DC | PRN
  Administered 2016-10-05 (×3): 4 mL via TOPICAL

## 2016-10-05 MED ORDER — PROPOFOL 10 MG/ML IV BOLUS
INTRAVENOUS | Status: DC | PRN
  Administered 2016-10-05: 35 mg via INTRAVENOUS

## 2016-10-05 MED ORDER — FENTANYL CITRATE (PF) 250 MCG/5ML IJ SOLN
INTRAMUSCULAR | Status: AC
  Filled 2016-10-05: qty 5

## 2016-10-05 MED ORDER — SODIUM BICARBONATE 8.4 % IV SOLN
25.0000 meq | Freq: Once | INTRAVENOUS | Status: AC
  Administered 2016-10-05: 25 meq via INTRAVENOUS

## 2016-10-05 MED ORDER — LACTATED RINGERS IV SOLN: INTRAVENOUS | Status: DC

## 2016-10-05 MED ORDER — METOPROLOL TARTRATE 25 MG/10 ML ORAL SUSPENSION: 12.5000 mg | Freq: Two times a day (BID) | ORAL | Status: DC

## 2016-10-05 MED ORDER — ORAL CARE MOUTH RINSE: 15.0000 mL | Freq: Four times a day (QID) | OROMUCOSAL | Status: DC

## 2016-10-05 MED ORDER — NITROGLYCERIN IN D5W 200-5 MCG/ML-% IV SOLN: 0.0000 ug/min | INTRAVENOUS | Status: DC

## 2016-10-05 MED ORDER — SODIUM CHLORIDE 0.9 % IV SOLN
INTRAVENOUS | Status: DC
Start: 2016-10-05 — End: 2016-10-08

## 2016-10-05 MED ORDER — MAGNESIUM SULFATE 4 GM/100ML IV SOLN
4.0000 g | Freq: Once | INTRAVENOUS | Status: AC
  Administered 2016-10-05: 4 g via INTRAVENOUS
  Filled 2016-10-05: qty 100

## 2016-10-05 MED ORDER — SODIUM CHLORIDE 0.9 % IV SOLN: 250.0000 mL | INTRAVENOUS | Status: DC

## 2016-10-05 MED ORDER — LACTATED RINGERS IV SOLN
INTRAVENOUS | Status: DC | PRN
  Administered 2016-10-05 (×2): via INTRAVENOUS

## 2016-10-05 MED ORDER — SODIUM CHLORIDE 0.9% FLUSH
10.0000 mL | Freq: Two times a day (BID) | INTRAVENOUS | Status: DC
  Administered 2016-10-06 – 2016-10-08 (×4): 10 mL
  Administered 2016-10-10: 20 mL

## 2016-10-05 MED ORDER — MORPHINE SULFATE (PF) 2 MG/ML IV SOLN: 2.0000 mg | INTRAVENOUS | Status: DC | PRN

## 2016-10-05 MED ORDER — ONDANSETRON HCL 4 MG/2ML IJ SOLN
4.0000 mg | Freq: Four times a day (QID) | INTRAMUSCULAR | Status: DC | PRN
  Administered 2016-10-06 – 2016-10-11 (×4): 4 mg via INTRAVENOUS
  Filled 2016-10-05 (×4): qty 2

## 2016-10-05 MED ORDER — ATORVASTATIN CALCIUM 20 MG PO TABS
20.0000 mg | ORAL_TABLET | Freq: Every day | ORAL | Status: DC
  Administered 2016-10-06 – 2016-10-11 (×6): 20 mg via ORAL
  Filled 2016-10-05 (×6): qty 1

## 2016-10-05 MED ORDER — MORPHINE SULFATE (PF) 4 MG/ML IV SOLN: 1.0000 mg | INTRAVENOUS | Status: DC | PRN

## 2016-10-05 MED ORDER — ACETAMINOPHEN 500 MG PO TABS
1000.0000 mg | ORAL_TABLET | Freq: Four times a day (QID) | ORAL | Status: AC
  Administered 2016-10-05 – 2016-10-10 (×19): 1000 mg via ORAL
  Filled 2016-10-05 (×19): qty 2

## 2016-10-05 MED ORDER — LACTATED RINGERS IV SOLN: 500.0000 mL | Freq: Once | INTRAVENOUS | Status: DC | PRN

## 2016-10-05 MED ORDER — PHENYLEPHRINE 40 MCG/ML (10ML) SYRINGE FOR IV PUSH (FOR BLOOD PRESSURE SUPPORT)
PREFILLED_SYRINGE | INTRAVENOUS | Status: AC
  Filled 2016-10-05: qty 10

## 2016-10-05 MED ORDER — ACETAMINOPHEN 160 MG/5ML PO SOLN: 650.0000 mg | Freq: Once | ORAL | Status: AC

## 2016-10-05 MED ORDER — PROTAMINE SULFATE 10 MG/ML IV SOLN
INTRAVENOUS | Status: AC
  Filled 2016-10-05: qty 25

## 2016-10-05 MED ORDER — HEPARIN SODIUM (PORCINE) 1000 UNIT/ML IJ SOLN
INTRAMUSCULAR | Status: DC | PRN
  Administered 2016-10-05: 2000 [IU] via INTRAVENOUS
  Administered 2016-10-05: 21000 [IU] via INTRAVENOUS

## 2016-10-05 MED ORDER — ORAL CARE MOUTH RINSE
15.0000 mL | Freq: Two times a day (BID) | OROMUCOSAL | Status: DC
  Administered 2016-10-06 – 2016-10-10 (×8): 15 mL via OROMUCOSAL

## 2016-10-05 MED ORDER — DOPAMINE-DEXTROSE 3.2-5 MG/ML-% IV SOLN: 1.5000 ug/kg/min | INTRAVENOUS | Status: DC

## 2016-10-05 MED ORDER — LEVALBUTEROL HCL 0.63 MG/3ML IN NEBU
0.6300 mg | INHALATION_SOLUTION | Freq: Four times a day (QID) | RESPIRATORY_TRACT | Status: DC
  Administered 2016-10-05 – 2016-10-09 (×14): 0.63 mg via RESPIRATORY_TRACT
  Filled 2016-10-05 (×15): qty 3

## 2016-10-05 MED ORDER — ACETAMINOPHEN 160 MG/5ML PO SOLN: 1000.0000 mg | Freq: Four times a day (QID) | ORAL | Status: AC

## 2016-10-05 MED ORDER — ALBUTEROL SULFATE HFA 108 (90 BASE) MCG/ACT IN AERS
INHALATION_SPRAY | RESPIRATORY_TRACT | Status: DC | PRN
  Administered 2016-10-05: 2 via RESPIRATORY_TRACT

## 2016-10-05 MED ORDER — MIDAZOLAM HCL 5 MG/5ML IJ SOLN
INTRAMUSCULAR | Status: DC | PRN
  Administered 2016-10-05: 2 mg via INTRAVENOUS
  Administered 2016-10-05 (×2): 3 mg via INTRAVENOUS
  Administered 2016-10-05: 2 mg via INTRAVENOUS

## 2016-10-05 MED ORDER — BISACODYL 5 MG PO TBEC
10.0000 mg | DELAYED_RELEASE_TABLET | Freq: Every day | ORAL | Status: DC
  Administered 2016-10-06 – 2016-10-12 (×7): 10 mg via ORAL
  Filled 2016-10-05 (×7): qty 2

## 2016-10-05 MED ORDER — ALBUMIN HUMAN 5 % IV SOLN
INTRAVENOUS | Status: DC | PRN
Start: 2016-10-05 — End: 2016-10-05
  Administered 2016-10-05 (×2): via INTRAVENOUS

## 2016-10-05 MED ORDER — CHLORHEXIDINE GLUCONATE 0.12 % MT SOLN
15.0000 mL | OROMUCOSAL | Status: AC
  Administered 2016-10-05: 15 mL via OROMUCOSAL

## 2016-10-05 MED ORDER — MIDAZOLAM HCL 2 MG/2ML IJ SOLN: 2.0000 mg | INTRAMUSCULAR | Status: DC | PRN

## 2016-10-05 MED ORDER — SODIUM CHLORIDE 0.9 % IV SOLN
0.0000 ug/min | INTRAVENOUS | Status: DC
  Administered 2016-10-05: 70 ug/min via INTRAVENOUS
  Administered 2016-10-06 (×3): 80 ug/min via INTRAVENOUS
  Filled 2016-10-05 (×8): qty 2

## 2016-10-05 MED ORDER — ALBUTEROL SULFATE HFA 108 (90 BASE) MCG/ACT IN AERS
INHALATION_SPRAY | RESPIRATORY_TRACT | Status: AC
  Filled 2016-10-05: qty 6.7

## 2016-10-05 MED ORDER — ARTIFICIAL TEARS OPHTHALMIC OINT
TOPICAL_OINTMENT | OPHTHALMIC | Status: DC | PRN
  Administered 2016-10-05: 1 via OPHTHALMIC

## 2016-10-05 MED ORDER — OXYCODONE HCL 5 MG PO TABS
5.0000 mg | ORAL_TABLET | ORAL | Status: DC | PRN
  Administered 2016-10-05 – 2016-10-09 (×6): 5 mg via ORAL
  Administered 2016-10-09: 10 mg via ORAL
  Administered 2016-10-09: 5 mg via ORAL
  Administered 2016-10-11 (×2): 10 mg via ORAL
  Filled 2016-10-05: qty 2
  Filled 2016-10-05 (×2): qty 1
  Filled 2016-10-05: qty 2
  Filled 2016-10-05 (×3): qty 1
  Filled 2016-10-05: qty 2
  Filled 2016-10-05 (×2): qty 1

## 2016-10-05 MED ORDER — 0.9 % SODIUM CHLORIDE (POUR BTL) OPTIME
TOPICAL | Status: DC | PRN
  Administered 2016-10-05: 1000 mL
  Administered 2016-10-05: 5000 mL

## 2016-10-05 MED ORDER — LACTATED RINGERS IV SOLN
INTRAVENOUS | Status: DC | PRN
  Administered 2016-10-05 (×2): via INTRAVENOUS

## 2016-10-05 MED ORDER — ALBUMIN HUMAN 5 % IV SOLN
250.0000 mL | INTRAVENOUS | Status: AC | PRN
  Administered 2016-10-05 (×2): 250 mL via INTRAVENOUS
  Filled 2016-10-05 (×2): qty 250

## 2016-10-05 MED ORDER — PROPOFOL 10 MG/ML IV BOLUS
INTRAVENOUS | Status: AC
  Filled 2016-10-05: qty 20

## 2016-10-05 MED ORDER — SODIUM CHLORIDE 0.9% FLUSH
3.0000 mL | Freq: Two times a day (BID) | INTRAVENOUS | Status: DC
  Administered 2016-10-06 – 2016-10-12 (×11): 3 mL via INTRAVENOUS

## 2016-10-05 MED ORDER — SODIUM CHLORIDE 0.9% FLUSH
3.0000 mL | INTRAVENOUS | Status: DC | PRN
  Administered 2016-10-07: 3 mL via INTRAVENOUS
  Filled 2016-10-05: qty 3

## 2016-10-05 MED ORDER — METOPROLOL TARTRATE 5 MG/5ML IV SOLN
2.5000 mg | INTRAVENOUS | Status: DC | PRN
  Administered 2016-10-07 – 2016-10-11 (×3): 5 mg via INTRAVENOUS
  Filled 2016-10-05 (×3): qty 5

## 2016-10-05 MED ORDER — ASPIRIN EC 325 MG PO TBEC
325.0000 mg | DELAYED_RELEASE_TABLET | Freq: Every day | ORAL | Status: DC
  Administered 2016-10-06 – 2016-10-12 (×7): 325 mg via ORAL
  Filled 2016-10-05 (×7): qty 1

## 2016-10-05 MED ORDER — BISACODYL 5 MG PO TBEC
5.0000 mg | DELAYED_RELEASE_TABLET | Freq: Once | ORAL | Status: DC
  Filled 2016-10-05: qty 1

## 2016-10-05 MED ORDER — DEXTROSE 5 % IV SOLN
1.5000 g | Freq: Two times a day (BID) | INTRAVENOUS | Status: AC
  Administered 2016-10-05 – 2016-10-07 (×4): 1.5 g via INTRAVENOUS
  Filled 2016-10-05 (×4): qty 1.5

## 2016-10-05 MED ORDER — POTASSIUM CHLORIDE 10 MEQ/50ML IV SOLN
10.0000 meq | INTRAVENOUS | Status: AC
  Administered 2016-10-05 (×2): 10 meq via INTRAVENOUS

## 2016-10-05 MED ORDER — TRAMADOL HCL 50 MG PO TABS
50.0000 mg | ORAL_TABLET | ORAL | Status: DC | PRN
  Administered 2016-10-06 (×2): 100 mg via ORAL
  Filled 2016-10-05 (×2): qty 2

## 2016-10-05 MED ORDER — PROTAMINE SULFATE 10 MG/ML IV SOLN
INTRAVENOUS | Status: DC | PRN
  Administered 2016-10-05: 230 mg via INTRAVENOUS

## 2016-10-05 MED ORDER — ROCURONIUM BROMIDE 100 MG/10ML IV SOLN
INTRAVENOUS | Status: DC | PRN
  Administered 2016-10-05 (×5): 50 mg via INTRAVENOUS

## 2016-10-05 MED ORDER — METOPROLOL TARTRATE 12.5 MG HALF TABLET
12.5000 mg | ORAL_TABLET | Freq: Two times a day (BID) | ORAL | Status: DC
  Administered 2016-10-06 – 2016-10-12 (×11): 12.5 mg via ORAL
  Filled 2016-10-05 (×12): qty 1

## 2016-10-05 MED ORDER — ASPIRIN 81 MG PO CHEW
324.0000 mg | CHEWABLE_TABLET | Freq: Every day | ORAL | Status: DC
  Filled 2016-10-05: qty 4

## 2016-10-05 MED ORDER — HEMOSTATIC AGENTS (NO CHARGE) OPTIME
TOPICAL | Status: DC | PRN
  Administered 2016-10-05: 1 via TOPICAL

## 2016-10-05 MED ORDER — SODIUM CHLORIDE 0.45 % IV SOLN: INTRAVENOUS | Status: DC | PRN

## 2016-10-05 MED ORDER — MORPHINE SULFATE (PF) 2 MG/ML IV SOLN: 1.0000 mg | INTRAVENOUS | Status: DC | PRN

## 2016-10-05 MED ORDER — MORPHINE SULFATE (PF) 4 MG/ML IV SOLN
2.0000 mg | INTRAVENOUS | Status: DC | PRN
  Administered 2016-10-05 – 2016-10-06 (×4): 4 mg via INTRAVENOUS
  Filled 2016-10-05 (×5): qty 1

## 2016-10-05 MED ORDER — CHLORHEXIDINE GLUCONATE CLOTH 2 % EX PADS
6.0000 | MEDICATED_PAD | Freq: Every day | CUTANEOUS | Status: DC
  Administered 2016-10-05 – 2016-10-11 (×6): 6 via TOPICAL

## 2016-10-05 MED FILL — Potassium Chloride Inj 2 mEq/ML: INTRAVENOUS | Qty: 10 | Status: AC

## 2016-10-05 MED FILL — Magnesium Sulfate Inj 50%: INTRAMUSCULAR | Qty: 2 | Status: AC

## 2016-10-05 MED FILL — Heparin Sodium (Porcine) Inj 1000 Unit/ML: INTRAMUSCULAR | Qty: 30 | Status: AC

## 2016-10-05 MED FILL — Verapamil HCl IV Soln 2.5 MG/ML: INTRAVENOUS | Qty: 2 | Status: AC

## 2016-10-05 SURGICAL SUPPLY — 117 items
BAG DECANTER FOR FLEXI CONT (MISCELLANEOUS) ×3 IMPLANT
BANDAGE ACE 4X5 VEL STRL LF (GAUZE/BANDAGES/DRESSINGS) IMPLANT
BANDAGE ACE 6X5 VEL STRL LF (GAUZE/BANDAGES/DRESSINGS) IMPLANT
BANDAGE ELASTIC 4 VELCRO ST LF (GAUZE/BANDAGES/DRESSINGS) ×3 IMPLANT
BANDAGE ELASTIC 6 VELCRO ST LF (GAUZE/BANDAGES/DRESSINGS) ×3 IMPLANT
BASKET HEART (ORDER IN 25'S) (MISCELLANEOUS) ×1
BASKET HEART (ORDER IN 25S) (MISCELLANEOUS) ×2 IMPLANT
BIOPATCH RED 1 DISK 7.0 (GAUZE/BANDAGES/DRESSINGS) ×3 IMPLANT
BLADE STERNUM SYSTEM 6 (BLADE) ×3 IMPLANT
BLADE SURG 11 STRL SS (BLADE) ×3 IMPLANT
BNDG GAUZE ELAST 4 BULKY (GAUZE/BANDAGES/DRESSINGS) ×3 IMPLANT
CANISTER SUCT 3000ML PPV (MISCELLANEOUS) ×3 IMPLANT
CANNULA EZ GLIDE AORTIC 21FR (CANNULA) ×3 IMPLANT
CATH CPB KIT HENDRICKSON (MISCELLANEOUS) ×3 IMPLANT
CATH ROBINSON RED A/P 18FR (CATHETERS) ×3 IMPLANT
CATH THORACIC 36FR (CATHETERS) ×3 IMPLANT
CATH THORACIC 36FR RT ANG (CATHETERS) ×3 IMPLANT
CHLORAPREP W/TINT 10.5 ML (MISCELLANEOUS) ×3 IMPLANT
CLIP TI WIDE RED SMALL 24 (CLIP) ×15 IMPLANT
CLIP VESOCCLUDE MED 24/CT (Clip) IMPLANT
CLIP VESOCCLUDE SM WIDE 24/CT (Clip) IMPLANT
CRADLE DONUT ADULT HEAD (MISCELLANEOUS) ×3 IMPLANT
DERMABOND ADVANCED (GAUZE/BANDAGES/DRESSINGS) ×1
DERMABOND ADVANCED .7 DNX12 (GAUZE/BANDAGES/DRESSINGS) ×2 IMPLANT
DRAPE CARDIOVASCULAR INCISE (DRAPES) ×1
DRAPE SLUSH/WARMER DISC (DRAPES) ×3 IMPLANT
DRAPE SRG 135X102X78XABS (DRAPES) ×2 IMPLANT
DRSG AQUACEL AG ADV 3.5X14 (GAUZE/BANDAGES/DRESSINGS) ×3 IMPLANT
DRSG COVADERM 4X14 (GAUZE/BANDAGES/DRESSINGS) IMPLANT
ELECT BLADE 4.0 EZ CLEAN MEGAD (MISCELLANEOUS) ×3
ELECT REM PT RETURN 9FT ADLT (ELECTROSURGICAL) ×6
ELECTRODE BLDE 4.0 EZ CLN MEGD (MISCELLANEOUS) ×2 IMPLANT
ELECTRODE REM PT RTRN 9FT ADLT (ELECTROSURGICAL) ×4 IMPLANT
FELT TEFLON 1X6 (MISCELLANEOUS) ×3 IMPLANT
GAUZE SPONGE 4X4 12PLY STRL (GAUZE/BANDAGES/DRESSINGS) IMPLANT
GAUZE SPONGE 4X4 12PLY STRL LF (GAUZE/BANDAGES/DRESSINGS) ×6 IMPLANT
GLOVE BIO SURGEON STRL SZ 6.5 (GLOVE) ×18 IMPLANT
GLOVE BIOGEL M STER SZ 6 (GLOVE) ×9 IMPLANT
GLOVE BIOGEL PI IND STRL 6.5 (GLOVE) ×2 IMPLANT
GLOVE BIOGEL PI INDICATOR 6.5 (GLOVE) ×1
GLOVE SURG SIGNA 7.5 PF LTX (GLOVE) ×9 IMPLANT
GOWN STRL REUS W/ TWL LRG LVL3 (GOWN DISPOSABLE) ×12 IMPLANT
GOWN STRL REUS W/ TWL XL LVL3 (GOWN DISPOSABLE) ×4 IMPLANT
GOWN STRL REUS W/TWL LRG LVL3 (GOWN DISPOSABLE) ×6
GOWN STRL REUS W/TWL XL LVL3 (GOWN DISPOSABLE) ×2
HEMOSTAT POWDER SURGIFOAM 1G (HEMOSTASIS) ×9 IMPLANT
HEMOSTAT SURGICEL 2X14 (HEMOSTASIS) ×3 IMPLANT
INSERT FOGARTY XLG (MISCELLANEOUS) IMPLANT
KIT BASIN OR (CUSTOM PROCEDURE TRAY) ×3 IMPLANT
KIT ROOM TURNOVER OR (KITS) ×3 IMPLANT
KIT SUCTION CATH 14FR (SUCTIONS) ×6 IMPLANT
KIT VASOVIEW HEMOPRO VH 3000 (KITS) ×3 IMPLANT
MARKER GRAFT CORONARY BYPASS (MISCELLANEOUS) ×9 IMPLANT
NS IRRIG 1000ML POUR BTL (IV SOLUTION) ×18 IMPLANT
PACK OPEN HEART (CUSTOM PROCEDURE TRAY) ×3 IMPLANT
PAD ARMBOARD 7.5X6 YLW CONV (MISCELLANEOUS) ×6 IMPLANT
PAD ELECT DEFIB RADIOL ZOLL (MISCELLANEOUS) ×3 IMPLANT
PENCIL BUTTON HOLSTER BLD 10FT (ELECTRODE) ×3 IMPLANT
PUNCH AORTIC ROTATE  4.5MM 8IN (MISCELLANEOUS) ×3 IMPLANT
PUNCH AORTIC ROTATE 4.0MM (MISCELLANEOUS) IMPLANT
PUNCH AORTIC ROTATE 4.5MM 8IN (MISCELLANEOUS) IMPLANT
PUNCH AORTIC ROTATE 5MM 8IN (MISCELLANEOUS) IMPLANT
SET CARDIOPLEGIA MPS 5001102 (MISCELLANEOUS) ×3 IMPLANT
SPONGE LAP 18X18 X RAY DECT (DISPOSABLE) ×6 IMPLANT
SPONGE LAP 4X18 X RAY DECT (DISPOSABLE) ×3 IMPLANT
STAPLER VISISTAT 35W (STAPLE) ×6 IMPLANT
SUT BONE WAX W31G (SUTURE) ×3 IMPLANT
SUT ETHIBOND 2 0 SH (SUTURE) ×4
SUT ETHIBOND 2 0 SH 36X2 (SUTURE) ×8 IMPLANT
SUT MNCRL AB 4-0 PS2 18 (SUTURE) ×3 IMPLANT
SUT PROLENE 3 0 SH DA (SUTURE) ×3 IMPLANT
SUT PROLENE 4 0 RB 1 (SUTURE) ×2
SUT PROLENE 4 0 SH DA (SUTURE) ×3 IMPLANT
SUT PROLENE 4-0 RB1 .5 CRCL 36 (SUTURE) ×4 IMPLANT
SUT PROLENE 5 0 C 1 36 (SUTURE) ×6 IMPLANT
SUT PROLENE 6 0 C 1 30 (SUTURE) ×12 IMPLANT
SUT PROLENE 7 0 BV 1 (SUTURE) ×3 IMPLANT
SUT PROLENE 7 0 BV1 MDA (SUTURE) ×6 IMPLANT
SUT PROLENE 8 0 BV175 6 (SUTURE) ×9 IMPLANT
SUT SILK  1 MH (SUTURE) ×3
SUT SILK 1 MH (SUTURE) ×6 IMPLANT
SUT SILK 1 TIES 10X30 (SUTURE) ×3 IMPLANT
SUT SILK 2 0 SH (SUTURE) ×3 IMPLANT
SUT SILK 2 0 SH CR/8 (SUTURE) ×6 IMPLANT
SUT SILK 2 0 TIES 10X30 (SUTURE) ×3 IMPLANT
SUT SILK 2 0 TIES 17X18 (SUTURE) ×1
SUT SILK 2-0 18XBRD TIE BLK (SUTURE) ×2 IMPLANT
SUT SILK 3 0 SH CR/8 (SUTURE) ×3 IMPLANT
SUT SILK 4 0 TIE 10X30 (SUTURE) ×6 IMPLANT
SUT STEEL 6MS V (SUTURE) ×3 IMPLANT
SUT STEEL STERNAL CCS#1 18IN (SUTURE) IMPLANT
SUT STEEL SZ 6 DBL 3X14 BALL (SUTURE) ×3 IMPLANT
SUT TEM PAC WIRE 2 0 SH (SUTURE) ×12 IMPLANT
SUT VIC AB 1 CTX 36 (SUTURE) ×2
SUT VIC AB 1 CTX36XBRD ANBCTR (SUTURE) ×4 IMPLANT
SUT VIC AB 2-0 CT1 27 (SUTURE) ×1
SUT VIC AB 2-0 CT1 TAPERPNT 27 (SUTURE) ×2 IMPLANT
SUT VIC AB 2-0 CTX 27 (SUTURE) IMPLANT
SUT VIC AB 3-0 SH 27 (SUTURE)
SUT VIC AB 3-0 SH 27X BRD (SUTURE) IMPLANT
SUT VIC AB 3-0 X1 27 (SUTURE) IMPLANT
SUT VICRYL 2 0 J607H (SUTURE) ×6 IMPLANT
SUT VICRYL 3 0 (SUTURE) ×6 IMPLANT
SUT VICRYL 4-0 PS2 18IN ABS (SUTURE) IMPLANT
SUTURE E-PAK OPEN HEART (SUTURE) IMPLANT
SYSTEM SAHARA CHEST DRAIN ATS (WOUND CARE) ×3 IMPLANT
TAPE CLOTH SURG 4X10 WHT LF (GAUZE/BANDAGES/DRESSINGS) ×3 IMPLANT
TAPE PAPER 3X10 WHT MICROPORE (GAUZE/BANDAGES/DRESSINGS) ×3 IMPLANT
TOWEL GREEN STERILE (TOWEL DISPOSABLE) ×3 IMPLANT
TOWEL GREEN STERILE FF (TOWEL DISPOSABLE) IMPLANT
TOWEL OR 17X24 6PK STRL BLUE (TOWEL DISPOSABLE) IMPLANT
TOWEL OR 17X26 10 PK STRL BLUE (TOWEL DISPOSABLE) IMPLANT
TRAY FOLEY SILVER 16FR TEMP (SET/KITS/TRAYS/PACK) ×3 IMPLANT
TUBE FEEDING 8FR 16IN STR KANG (MISCELLANEOUS) ×3 IMPLANT
TUBING INSUFFLATION (TUBING) ×3 IMPLANT
UNDERPAD 30X30 (UNDERPADS AND DIAPERS) ×3 IMPLANT
WATER STERILE IRR 1000ML POUR (IV SOLUTION) ×6 IMPLANT

## 2016-10-05 NOTE — Anesthesia Preprocedure Evaluation (Signed)
Anesthesia Evaluation  Patient identified by MRN, date of birth, ID band Patient awake    Reviewed: Allergy & Precautions, NPO status , Patient's Chart, lab work & pertinent test results  Airway Mallampati: I  TM Distance: >3 FB Neck ROM: Full    Dental   Pulmonary Current Smoker,    Pulmonary exam normal        Cardiovascular hypertension, Pt. on medications + CAD  Normal cardiovascular exam     Neuro/Psych    GI/Hepatic GERD  Medicated and Controlled,  Endo/Other    Renal/GU      Musculoskeletal   Abdominal   Peds  Hematology   Anesthesia Other Findings   Reproductive/Obstetrics                             Anesthesia Physical Anesthesia Plan  ASA: III  Anesthesia Plan: General   Post-op Pain Management:    Induction: Intravenous  PONV Risk Score and Plan: 1 and Ondansetron and Treatment may vary due to age or medical condition  Airway Management Planned: Oral ETT  Additional Equipment: Arterial line, CVP, PA Cath, TEE and Ultrasound Guidance Line Placement  Intra-op Plan:   Post-operative Plan: Post-operative intubation/ventilation  Informed Consent: I have reviewed the patients History and Physical, chart, labs and discussed the procedure including the risks, benefits and alternatives for the proposed anesthesia with the patient or authorized representative who has indicated his/her understanding and acceptance.     Plan Discussed with: CRNA and Surgeon  Anesthesia Plan Comments:         Anesthesia Quick Evaluation

## 2016-10-05 NOTE — Anesthesia Procedure Notes (Signed)
Procedure Name: Intubation Date/Time: 10/05/2016 7:49 AM Performed by: Shirlyn Goltz Pre-anesthesia Checklist: Patient identified, Emergency Drugs available, Suction available and Patient being monitored Patient Re-evaluated:Patient Re-evaluated prior to induction Oxygen Delivery Method: Circle system utilized Preoxygenation: Pre-oxygenation with 100% oxygen Induction Type: IV induction Ventilation: Mask ventilation without difficulty Laryngoscope Size: Mac and 4 Grade View: Grade I Tube type: Oral Tube size: 8.0 mm Number of attempts: 1 Airway Equipment and Method: Stylet Placement Confirmation: ETT inserted through vocal cords under direct vision,  positive ETCO2 and breath sounds checked- equal and bilateral Secured at: 20 cm Tube secured with: Tape Dental Injury: Teeth and Oropharynx as per pre-operative assessment

## 2016-10-05 NOTE — Transfer of Care (Signed)
Immediate Anesthesia Transfer of Care Note  Patient: Devin CostainFrank L Pitner  Procedure(s) Performed: Procedure(s): CORONARY ARTERY BYPASS GRAFTING (CABG) x4 with FREE MAMMARY.  ENDOSCOPIC HARVESTING OF RIGHT SAPHENOUS VEIN. (FREE LIMA to LAD, SVG to OM, SVG SEQUENTIALLY to ACUTE MARGINAL and PDA) (N/A) TRANSESOPHAGEAL ECHOCARDIOGRAM (TEE) (N/A)  Patient Location: ICU  Anesthesia Type:General  Level of Consciousness: sedated and intubated   Airway & Oxygen Therapy: Patient remains intubated per anesthesia plan and Patient placed on Ventilator (see vital sign flow sheet for setting)  Post-op Assessment: Report given to RN and Post -op Vital signs reviewed and stable BP 118/62, spo2 100% HR 90 paced  Post vital signs: Reviewed and stable  Last Vitals:  Vitals:   10/05/16 0013 10/05/16 0428  BP: 100/60 109/67  Pulse: 63 71  Resp: 17 17  Temp: 36.9 C 37.1 C    Last Pain:  Vitals:   10/05/16 0428  TempSrc: Oral      Patients Stated Pain Goal: 5 (10/04/16 0821)  Complications: No apparent anesthesia complications

## 2016-10-05 NOTE — OR Nursing (Signed)
[id number] Right leg incision made per Jacques Earthly. Zimmerman, C-PA for endoscopic harvesting of right saphenous vein.

## 2016-10-05 NOTE — Progress Notes (Signed)
  Echocardiogram Echocardiogram Transesophageal has been performed.  Janalyn HarderWest, Mukund Weinreb R 10/05/2016, 8:52 AM

## 2016-10-05 NOTE — Op Note (Signed)
NAME:  Devin Barton, Devin Barton NO.:  [id number]  MEDICAL RECORD NO.:  [id number]  LOCATION:  [id number]                        FACILITY:  [facility]  PHYSICIAN:  Salvatore Decent. Dorris Fetch, M.D.DATE OF BIRTH:  04-Dec-1945  DATE OF PROCEDURE:  10/04/2016 DATE OF DISCHARGE:                              OPERATIVE REPORT   PREOPERATIVE DIAGNOSES:  Left main and 3-vessel coronary disease with unstable angina.  POSTOPERATIVE DIAGNOSIS:  Left main and 3-vessel coronary disease with unstable angina.  PROCEDURE:   Median sternotomy, extracorporeal circulation, Coronary artery bypass grafting x 4  Free left internal mammary artery to left anterior descending,  Saphenous vein graft to obtuse marginal 1,  Sequential saphenous vein graft to acute marginal and posterior descending  SURGEON:  Viviann Spare C. Dorris Fetch, M.D.  ASSISTANT:  Doree Fudge, PA.  ANESTHESIA:  General.  FINDINGS:  Atrophic skin. 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 Left mammary and saphenous vein- good quality. LAD and OM- good targets. Acute marginal and posterior descending- poor targets. Transesophageal echocardiography showed EF of approximately 40%. Questionable small VSD.  Mild mitral regurgitation.  CLINICAL NOTE:  Devin Barton is a 71 year old gentleman with a history of heavy tobacco abuse and COPD, who presented about 3 weeks ago [facility] with chest pain and shortness of breath.  He ruled in for MI by troponins.  He was seen in consultation as an outpatient by Dr. Antoine Poche, who did a stress test and echocardiogram which suggested coronary disease with impaired left ventricular function.  He underwent cardiac catheterization, which revealed severe left main and 3-vessel disease, he was referred for coronary artery bypass graft.  The indications, risks, benefits, and alternatives were discussed in detail with the patient.  He understood, accepted the risks, and agreed  to proceed.  OPERATIVE NOTE:  Devin Barton was brought to the holding area on October 05, 2016.  Anesthesia placed a Swan-Ganz catheter and an arterial blood pressure monitoring line.  He was taken to the operating room, anesthetized, and intubated.  A Foley catheter was placed.  Intravenous antibiotics were administered.  The chest, abdomen, and legs were prepped and draped in usual sterile fashion.  Transesophageal echocardiography was performed by Dr. Arta Bruce of the anesthesia service.  A median sternotomy was performed and the left internal mammary artery was harvested using standard technique.  Simultaneously, incision was made in the medial aspect of the right leg just below the knee.  The greater saphenous vein was harvested from the right leg endoscopically. Both the mammary artery and saphenous vein were excellent quality vessels.  The mammary had excellent flow, but was too short to reach the heart due to the patient's COPD. Therefore, it was divided proximally for use as a free graft.  2000 units of heparin was administered during the vessel harvest.  The remainder of the full heparin dose was given prior to opening the pericardium.  After harvesting the conduits, the pericardium was opened.  The ascending aorta was inspected.  There was no atherosclerotic disease. The aorta was cannulated via concentric 2-0 Ethibond pledgeted pursestring sutures.  A dual-stage venous cannula was placed via a pursestring suture in the right atrial appendage.  Cardiopulmonary bypass  was initiated.  Flows were maintained per protocol.  The patient was cooled to 32 degrees Celsius.  The coronary arteries were inspected and anastomotic sites were chosen.  The conduits were inspected and cut to length.  A foam pad was placed in the pericardium to insulate the heart.  A temperature probe was placed in the myocardial septum and a cardioplegia cannula was placed in the ascending aorta.  The aorta  was crossclamped.  The left ventricle was emptied via the aortic root vent.  Cardiac arrest then was achieved with a combination of cold antegrade blood cardioplegia and topical iced saline. 1.5 L of cardioplegia was administered.  There was a rapid diastolic arrest and septal cooling to 10 degrees Celsius.  A reversed saphenous vein graft then was placed sequentially to the acute marginal and posterior descending branches of the right coronary.  The posterolateral branch was too small to graft.  The acute marginal and posterior descending were both 1 mm, poor quality targets.  A side-to- side anastomosis was performed to the acute marginal and end-to-side to the posterior descending.  Both were done with running 7-0 Prolene sutures.  Both anastomoses were probed proximally and distally prior to tying the suture.  Cardioplegia was administered down the graft.  Flow was better than expected.  There was good hemostasis.  After giving additional cardioplegia down the aortic root, the heart was elevated exposing the high anterolateral OM1, this was a 2-mm good quality target vessel.  The vein was of good quality, an end-to-side anastomosis was performed with a running 7-0 Prolene suture.  There was excellent flow through this graft and good hemostasis with cardioplegia administration.  The distal end of the free mammary artery graft was beveled.  It was a 1.8-mm good quality artery.  The LAD was a 1.5-mm good quality target. An end-to-side anastomosis was performed with a running 8-0 Prolene suture.  A probe passed easily proximally and distally.  After completion of the anastomosis, cardioplegia was administered and there was retrograde flow through the LAD.  The LAD and the vein grafts were cut to length.  The cardioplegia cannula was removed from the ascending aorta.  The proximal anastomoses were performed to 4.5 mm punch aortotomies.  A 7-0 Prolene suture was used for the left  mammary and 6-0 Prolene sutures were used for the vein grafts.  At completion of the final proximal anastomosis, the patient was placed in Trendelenburg position.  Lidocaine was administered.  The aortic root was de-aired and the aortic crossclamp was removed.  The total crossclamp time was 86 minutes.  The patient required 2 defibrillations with 20 joules and then was in heart block with a slow escape.  While rewarming was completed, all proximal and distal anastomoses were inspected for hemostasis.  Epicardial pacing wires were placed on the right ventricle and right atrium.  When the core temperature was 37 degrees Celsius, a low-dose dopamine infusion was initiated at 3 mcg/kg/minute.  The patient weaned from bypass on the first attempt without difficulty.  The total bypass time was 140 minutes.  The initial cardiac index was greater than 2 L/minute/sq m. The echocardiogram was essentially unchanged from the prebypass study. A test dose of protamine was administered and was well tolerated.  The atrial and aortic cannulae were removed.  The remainder of the protamine was administered without incident.  The chest was irrigated with warm saline.  Hemostasis was achieved.  Left pleural and mediastinal chest tubes were placed through separate subcostal  incisions.  The sternum was closed with a combination of single and double heavy gauge stainless steel wires.  The pectoralis fascia, subcutaneous tissue, and skin were closed in standard fashion.  All sponge, needle, and instrument counts were correct at the end of the procedure.  The patient was taken from the operating room to the Surgical Intensive Care Unit intubated and in good condition.     Salvatore Decent Dorris Fetch, M.D.     SCH/MEDQ  D:  10/05/2016  T:  10/05/2016  Job:  [id number]

## 2016-10-05 NOTE — Brief Op Note (Addendum)
10/04/2016 - 10/05/2016  11:32 AM  PATIENT:  Devin Barton  71 y.o. male  PRE-OPERATIVE DIAGNOSIS:  CAD (75% LM disease included)  POST-OPERATIVE DIAGNOSIS:  CAD (75% LM disease included)  PROCEDURE:  TRANSESOPHAGEAL ECHOCARDIOGRAM (TEE), MEDIAN STERNOTOMY for CORONARY ARTERY BYPASS GRAFTING (CABG) x 4   FREE LIMA to LAD,   SVG to OM,   SVG SEQUENTIALLY to ACUTE MARGINAL and PDA ENDOSCOPIC HARVESTING OF RIGHT GREATER SAPHENOUS VEIN.   SURGEON:  Surgeon(s) and Role:    * Loreli SlotHendrickson, Steven C, MD - Primary  PHYSICIAN ASSISTANT: Doree Fudgeonielle Zimmerman PA-C  ANESTHESIA:   general  EBL:  Total I/O In: 1800 [I.V.:1800] Out: 1400 [Urine:1400]  DRAINS: Chest tubes placed in the mediastinal and pleural spaces   COUNTS:  YES  PLAN OF CARE: Admit to inpatient   PATIENT DISPOSITION:  ICU - intubated and hemodynamically stable.   Delay start of Pharmacological VTE agent (>24hrs) due to surgical blood loss or risk of bleeding: yes  BASELINE WEIGHT: 56 kg  LAD and OM good targets, AM and PD poor targets

## 2016-10-05 NOTE — Progress Notes (Signed)
Initial Nutrition Assessment  DOCUMENTATION CODES:   Not applicable  INTERVENTION:    Diet advancement as able after extubation.   RD to monitor PO intake and add PO supplements when diet advanced.  NUTRITION DIAGNOSIS:   Inadequate oral intake related to inability to eat as evidenced by NPO status.  GOAL:   Patient will meet greater than or equal to 90% of their needs  MONITOR:   Diet advancement, PO intake, Labs, I & O's  REASON FOR ASSESSMENT:   Malnutrition Screening Tool    ASSESSMENT:   71 yo male with PMH of asthma, heavy tobacco use, and COPD who was admitted on 7/12 with chest pain and SOB. 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 S/P TEE & CABG 7/13.  Patient remains intubated at this time (just back from surgery), on quick wean protocol and should be extubated later today per discussion with RN. Unable to complete Nutrition-Focused physical exam at this time.  Patient with 6% weight loss within the past month. Suspect he is malnourished. Labs reviewed. Medications reviewed and include Colace and Flomax.  Diet Order:   NPO  Skin:   (incisions)  Last BM:  7/11  Height:   Ht Readings from Last 1 Encounters:  10/05/16 5\' 8"  (1.727 m)    Weight:   Wt Readings from Last 1 Encounters:  10/05/16 122 lb 11.2 oz (55.7 kg)    Ideal Body Weight:  70 kg  BMI:  Body mass index is 18.66 kg/m.  Estimated Nutritional Needs (after extubation):   Kcal:  1800-2000  Protein:  85-95 gm  Fluid:  1.8 L  EDUCATION NEEDS:   No education needs identified at this time  Joaquin CourtsKimberly Harris, RD, LDN, CNSC Pager [phone] After Hours Pager [phone]

## 2016-10-05 NOTE — Anesthesia Procedure Notes (Addendum)
Arterial Line Insertion Start/End7/13/2018 7:00 AM, 10/05/2016 7:15 AM Performed by: Simon RheinSSEY, KEVIN, MUELLER, THOMAS P, CRNA  Patient location: Pre-op. Preanesthetic checklist: patient identified, IV checked, risks and benefits discussed, monitors and equipment checked and pre-op evaluation Lidocaine 1% used for infiltration and patient sedated Left, radial was placed Catheter size: 20 G Hand hygiene performed  and maximum sterile barriers used  Allen's test indicative of satisfactory collateral circulation Attempts: 3 (2x jdongell crna x1 tom mueller crna ) Procedure performed without using ultrasound guided technique. Ultrasound Notes:anatomy identified, needle tip was noted to be adjacent to the nerve/plexus identified and no ultrasound evidence of intravascular and/or intraneural injection Following insertion, dressing applied and Biopatch. Post procedure assessment: normal  Patient tolerated the procedure well with no immediate complications.

## 2016-10-05 NOTE — OR Nursing (Signed)
1001 Right leg incisions closed per D. Joycelyn ManZimmerman, C-PA.

## 2016-10-05 NOTE — Progress Notes (Signed)
[facility] BRIEF SICU PROGRESS NOTE  Day of Surgery  S/P Procedure(s) (LRB): CORONARY ARTERY BYPASS GRAFTING (CABG) x4 with FREE MAMMARY.  ENDOSCOPIC HARVESTING OF RIGHT SAPHENOUS VEIN. (FREE LIMA to LAD, SVG to OM, SVG SEQUENTIALLY to ACUTE MARGINAL and PDA) (N/A) TRANSESOPHAGEAL ECHOCARDIOGRAM (TEE) (N/A)   Just extubated Neuro grossly intact NSR w/ stable hemodynamics  Chest tube output low UOP excellent Labs okay  Plan: Continue routine early postop  Purcell Nailslarence H Owen, MD 10/05/2016 7:44 PM

## 2016-10-05 NOTE — Care Management Note (Addendum)
Case Management Note  Patient Details  Name: Devin CostainFrank L Barton MRN: [id number] Date of Birth: 23-Jul-1945  Subjective/Objective:   From home with wife, pta indep, post op CABG , conts on vent. Per wife he has had HH services in the past but can not remember who that was with it was so long ago.  He has a PCP and he has medication coverage.    7/16 1025 Letha Capeeborah Cadin Luka RN, BSN -  POD 3 CABG, cont lasix for fluid overload, d/c remaining chest tubes, will transition iv amio to po amio.                   Action/Plan: NCM will follow for dc needs.   Expected Discharge Date:                  Expected Discharge Plan:  Home w Home Health Services  In-House Referral:     Discharge planning Services  CM Consult  Post Acute Care Choice:    Choice offered to:     DME Arranged:    DME Agency:     HH Arranged:    HH Agency:     Status of Service:  In process, will continue to follow  If discussed at Long Length of Stay Meetings, dates discussed:    Additional Comments:  Leone Havenaylor, Laurian Edrington Clinton, RN 10/05/2016, 4:47 PM

## 2016-10-05 NOTE — Anesthesia Postprocedure Evaluation (Signed)
Anesthesia Post Note  Patient: Devin Barton  Procedure(s) Performed: Procedure(s) (LRB): CORONARY ARTERY BYPASS GRAFTING (CABG) x4 with FREE MAMMARY.  ENDOSCOPIC HARVESTING OF RIGHT SAPHENOUS VEIN. (FREE LIMA to LAD, SVG to OM, SVG SEQUENTIALLY to ACUTE MARGINAL and PDA) (N/A) TRANSESOPHAGEAL ECHOCARDIOGRAM (TEE) (N/A)     Patient location during evaluation: SICU Anesthesia Type: General Level of consciousness: sedated Pain management: pain level controlled Vital Signs Assessment: post-procedure vital signs reviewed and stable Respiratory status: patient remains intubated per anesthesia plan Cardiovascular status: stable Anesthetic complications: no    Last Vitals:  Vitals:   10/05/16 0428 10/05/16 1330  BP: 109/67 (!) 119/59  Pulse: 71 90  Resp: 17 12  Temp: 37.1 C     Last Pain:  Vitals:   10/05/16 0428  TempSrc: Oral                 [name] DAVID

## 2016-10-05 NOTE — Progress Notes (Signed)
Patient transferred to OR with all belongings and family at bedside. Report given to anesthesia.

## 2016-10-05 NOTE — H&P (View-Only) (Signed)
Reason for Consult:Left main/ 3 vessel CAD Referring Physician: Dr. Irish Lack [name]/ Primary- Dr. Hazeline Junker is an 71 y.o. male.  HPI: 34 presents with chest pain and shortness of breath.  Mr. Janoski is a 71 yo man with a past history of heavy tobacco abuse (60+ pack years, now 1 ppd) and COPD (FEV1= 1.29 in 2012). He also has unexplained weight loss dating back several years and prostate enlargement per his wife. He has no prior history of CAD.  Admitted to [facility] with CP on 6/1. Treated with bronchodilators for possible COPD exacerbation. Had an elevated troponin and BNP but no cardiac w/u done. Saw Dr. Percival Spanish on 6/20. Lexiscan myoview and echo ordered. Lexiscan was high risk with significant scar EF 30-44%. Echo showed severe LV dysfunction, mild MR, mild TR. Over the past week he has been awakened several times a night with chest pressure and shortness of breath. He also has symptoms with even minimal activity.  Zubrod Score: At the time of surgery this patient's most appropriate activity status/level should be described as: []     0    Normal activity, no symptoms []     1    Restricted in physical strenuous activity but ambulatory, able to do out light work [x]     2    Ambulatory and capable of self care, unable to do work activities, up and about >50 % of waking hours                              []     3    Only limited self care, in bed greater than 50% of waking hours []     4    Completely disabled, no self care, confined to bed or chair []     5    Moribund   Past Medical History:  Diagnosis Date  . Asthma   . Cigarette smoker   . COPD (chronic obstructive pulmonary disease) ([place])     Past Surgical History:  Procedure Laterality Date  . ABDOMINAL SURGERY     Bowel perf secondary to trauma  . FOOT SURGERY    . HERNIA REPAIR     3 different times    Family History  Problem Relation Age of Onset  . Heart disease Mother        No details  .  Alzheimer's disease Mother   . Atopy Neg Hx     Social History:  reports that he has been smoking Cigarettes.  He has a 30.00 pack-year smoking history. He has never used smokeless tobacco. He reports that he does not drink alcohol or use drugs.  Allergies: No Known Allergies  Medications:  Prior to Admission:  Prescriptions Prior to Admission  Medication Sig Dispense Refill Last Dose  . albuterol (PROAIR HFA) 108 (90 BASE) MCG/ACT inhaler Inhale 2 puffs into the lungs every 6 (six) hours as needed for wheezing or shortness of breath.   10/04/2016 at 0600  . aspirin EC 81 MG tablet Take 162 mg by mouth daily.    10/04/2016 at 0600  . ibuprofen (ADVIL,MOTRIN) 200 MG tablet Take 400 mg by mouth daily as needed for moderate pain.     Marland Kitchen ipratropium (ATROVENT) 0.02 % nebulizer solution Take 0.5 mg by nebulization daily as needed for wheezing or shortness of breath.    10/04/2016 at 0600  . metoprolol succinate (TOPROL XL) 25 MG 24 hr tablet Take  0.5 tablets (12.5 mg total) by mouth daily. 45 tablet 1 10/03/2016 at 2200  . omeprazole (PRILOSEC) 40 MG capsule Take 40 mg by mouth daily.   10/03/2016 at 2200  . tamsulosin (FLOMAX) 0.4 MG CAPS Take 0.4 mg by mouth every evening.   10/03/2016 at 2200    Results for orders placed or performed during the hospital encounter of 10/04/16 (from the past 48 hour(s))  Basic metabolic panel     Status: Abnormal   Collection Time: 10/04/16  8:00 AM  Result Value Ref Range   Sodium 133 (L) 135 - 145 mmol/L   Potassium 4.1 3.5 - 5.1 mmol/L   Chloride 100 (L) 101 - 111 mmol/L   CO2 26 22 - 32 mmol/L   Glucose, Bld 87 65 - 99 mg/dL   BUN 9 6 - 20 mg/dL   Creatinine, Ser 0.99 0.61 - 1.24 mg/dL   Calcium 9.2 8.9 - 10.3 mg/dL   GFR calc non Af Amer >60 >60 mL/min   GFR calc Af Amer >60 >60 mL/min    Comment: (NOTE) The eGFR has been calculated using the CKD EPI equation. This calculation has not been validated in all clinical situations. eGFR's persistently <60  mL/min signify possible Chronic Kidney Disease.    Anion gap 7 5 - 15  CBC     Status: None   Collection Time: 10/04/16  8:00 AM  Result Value Ref Range   WBC 6.2 4.0 - 10.5 K/uL   RBC 4.52 4.22 - 5.81 MIL/uL   Hemoglobin 13.9 13.0 - 17.0 g/dL   HCT 42.3 39.0 - 52.0 %   MCV 93.6 78.0 - 100.0 fL   MCH 30.8 26.0 - 34.0 pg   MCHC 32.9 30.0 - 36.0 g/dL   RDW 14.2 11.5 - 15.5 %   Platelets 319 150 - 400 K/uL  Protime-INR     Status: None   Collection Time: 10/04/16  8:00 AM  Result Value Ref Range   Prothrombin Time 13.1 11.4 - 15.2 seconds   INR 0.99     No results found.  Review of Systems  Constitutional: Positive for malaise/fatigue and weight loss (not recent). Negative for chills and fever.  Eyes: Negative for blurred vision and double vision.  Respiratory: Positive for cough, shortness of breath and wheezing. Negative for hemoptysis.   Cardiovascular: Positive for chest pain. Negative for orthopnea and leg swelling.  Gastrointestinal: Negative for nausea and vomiting.  Genitourinary: Positive for frequency and urgency.  Neurological: Negative for dizziness, seizures and loss of consciousness.  All other systems reviewed and are negative.  Blood pressure 125/71, pulse 67, temperature (!) 97.5 F (36.4 C), temperature source Oral, resp. rate 18, height 5' 8"  (1.727 m), weight 137 lb (62.1 kg), SpO2 95 %. Physical Exam  Vitals reviewed. Constitutional: He is oriented to person, place, and time. No distress.  Thin with thenar and temporal wasting  HENT:  Head: Normocephalic and atraumatic.  Mouth/Throat: No oropharyngeal exudate.  Eyes: Conjunctivae and EOM are normal. No scleral icterus.  Neck: No thyromegaly present.  No bruits  Cardiovascular: Normal rate, regular rhythm and intact distal pulses.  Exam reveals gallop.   No murmur heard. Respiratory: Effort normal. No respiratory distress. He has no wheezes. He has no rales.  Diminished BS bilaterally, no wheezing at  present  GI: Soft. He exhibits no distension. There is no tenderness.  Musculoskeletal: He exhibits no edema.  Lymphadenopathy:    He has no cervical adenopathy.  Neurological:  He is alert and oriented to person, place, and time. No cranial nerve deficit.  Motor grossly intact  Skin: Skin is warm and dry.    Lexiscan Myoview Study Result    There was no ST segment deviation noted during stress.  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 High risk based on large scar burden and decreased LVEF. There is no significant myocardium currently at jeopardy. Consider correlating LVEF with echo.  The left ventricular ejection fraction is moderately decreased (30-44%).  ECHO Study Conclusions  - Left ventricle: The cavity size was normal. Wall thickness was   increased in a pattern of mild LVH. Systolic function was   severely reduced. The estimated ejection fraction was in the   range of 25% to 30%. Abnormal global longitudinal strain of   -11.3%. Diffuse hypokinesis. There is severe hypokinesis of the   anteroseptal and apical myocardium. Doppler parameters are   consistent with abnormal left ventricular relaxation (grade 1   diastolic dysfunction). - Ventricular septum: Septal motion showed abnormal function and   dyssynergy. - Aortic valve: Mildly calcified annulus. Trileaflet; mildly   calcified leaflets. Valve area (Vmax): 1.89 cm^2. - Mitral valve: Calcified annulus. Mildly thickened leaflets .   There was mild regurgitation. - Right atrium: Central venous pressure (est): 3 mm Hg. - Atrial septum: No defect or patent foramen ovale was identified. - Tricuspid valve: There was trivial regurgitation. - Pulmonary arteries: PA peak pressure: 19 mm Hg (S). - Pericardium, extracardiac: There was no  pericardial effusion.  Impressions:  - Mild LVH with LVEF approximately 25-30%. There is diffuse   hypokinesis with septal dyssynergy and hypokinesis of the   anteroseptal and apical myocardium. 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 Grade 1 diastolic dysfunction. No definite LV mural   thrombus but images are somewhat limited, could consider a   Definity contrast study. Mildly calcified mitral annulus with   mildly thickened leaflets and mild mitral regurgitation. Mildly   sclerotic aortic valve. Trivial tricuspid regurgitation with PASP   estimated 19 mmHg.  CARDIAC CATHETERIZATION Conclusion     Prox RCA lesion, 100 %stenosed.  LM lesion, 75 %stenosed.  Ost 1st Mrg lesion, 75 %stenosed.  Ost LAD to Prox LAD lesion, 90 %stenosed.  Mid LAD lesion, 75 %stenosed.  LV end diastolic pressure is low.  There is no aortic valve stenosis.  LV end diastolic pressure is normal.  Normal PA pressures. Ao Sat 90%. PA sat 61%. CO 4.2 L.min; CI 2.4  Right radial loop.   Severe three vessel CAD.  Known LV dysfunction from noninvasive testing, but well compensated.  Due to high risk anatomy, will admit the patient and start IV heparin.  Plan for cardiac surgery consult.    I personally reviewed the cath images and concur with the findings noted above  Assessment/Plan: 71 yo man with a history of heavy tobacco abuse and COPD who presents with an unstable coronary syndrome. High risk nuclear study, echo showed severe LV dysfunction. 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   CABG indicated for survival benefit and relief of symptoms.   High risk secondary to severe COPD and moderate to severe protein calorie malnutrition.  I have discussed the general nature of the procedure, the need for general anesthesia, the use of cardiopulmonary bypass, and the incisions to be used with  Mr. Hiemstra and his family. We discussed the expected hospital stay, overall recovery and short and long  term outcomes. I informed them of the indications, risks, benefits and alternatives. They understand the risks include, but are not limited to death, stroke, MI, DVT/PE, bleeding, possible need for transfusion, infections,other organ system dysfunction including respiratory, renal, or GI complications. They understand he is high risk for respiratory complications.   He accepts the risks and agrees to proceed.  For CABG in AM  Melrose Nakayama 10/04/2016, 2:34 PM

## 2016-10-05 NOTE — Interval H&P Note (Signed)
History and Physical Interval Note: Moderate left ICA stenosis 60-79%, severe COPD  10/05/2016 7:23 AM  Devin Barton  has presented today for surgery, with the diagnosis of CAD  The various methods of treatment have been discussed with the patient and family. After consideration of risks, benefits and other options for treatment, the patient has consented to  Procedure(s): CORONARY ARTERY BYPASS GRAFTING (CABG) (N/A) TRANSESOPHAGEAL ECHOCARDIOGRAM (TEE) (N/A) as a surgical intervention .  The patient's history has been reviewed, patient examined, no change in status, stable for surgery.  I have reviewed the patient's chart and labs.  Questions were answered to the patient's satisfaction.     [name]

## 2016-10-05 NOTE — Plan of Care (Signed)
Problem: Respiratory: Goal: Ability to tolerate decreased levels of ventilator support will improve Outcome: Completed/Met Date Met: 10/05/16 Pt extubated at 2671 without complications. Patient on 3L [place]. Will continue to monitor patient.

## 2016-10-05 NOTE — Plan of Care (Signed)
Problem: Safety: Goal: Ability to remain free from injury will improve Outcome: Progressing No falls during this admission. Call bell within reach. Bed in low and locked position. Patient alert and oriented. Nonskid footwear being utilized. 3/4 siderails in place. Clean and clear environment maintained. Patient verbalized understanding of safety instruction.  Problem: Activity: Goal: Ability to return to baseline activity level will improve Outcome: Progressing Patient able to ambulate with standby assistance and without the use of an assistive device at this time. No SOB or chest pain.

## 2016-10-05 NOTE — Progress Notes (Signed)
Notified MD Cornelius Moraswen of patients 1 hour post-extubation ABG. No new orders at this time. Will continue to monitor.  Horton ChinMacKayla A Jax Kentner, RN

## 2016-10-06 ENCOUNTER — Inpatient Hospital Stay (HOSPITAL_COMMUNITY): Payer: Medicare HMO

## 2016-10-06 LAB — BASIC METABOLIC PANEL
ANION GAP: 3 — AB (ref 5–15)
BUN: 7 mg/dL (ref 6–20)
CO2: 25 mmol/L (ref 22–32)
Calcium: 7.3 mg/dL — ABNORMAL LOW (ref 8.9–10.3)
Chloride: 108 mmol/L (ref 101–111)
Creatinine, Ser: 0.7 mg/dL (ref 0.61–1.24)
GFR calc Af Amer: >60 mL/min (ref >60)
GFR calc non Af Amer: >60 mL/min (ref >60)
GLUCOSE: 103 mg/dL — AB (ref 65–99)
POTASSIUM: 3.9 mmol/L (ref 3.5–5.1)
Sodium: 136 mmol/L (ref 135–145)

## 2016-10-06 LAB — POCT I-STAT, CHEM 8
BUN: 11 mg/dL (ref 6–20)
CALCIUM ION: 1.12 mmol/L — AB (ref 1.15–1.40)
CHLORIDE: 100 mmol/L — AB (ref 101–111)
Creatinine, Ser: 0.6 mg/dL — ABNORMAL LOW (ref 0.61–1.24)
Glucose, Bld: 110 mg/dL — ABNORMAL HIGH (ref 65–99)
HCT: 29 % — ABNORMAL LOW (ref 39.0–52.0)
HEMOGLOBIN: 9.9 g/dL — AB (ref 13.0–17.0)
Potassium: 4.3 mmol/L (ref 3.5–5.1)
SODIUM: 137 mmol/L (ref 135–145)
TCO2: 23 mmol/L (ref 0–100)

## 2016-10-06 LAB — CBC
HEMATOCRIT: 24.7 % — AB (ref 39.0–52.0)
HEMATOCRIT: 25.3 % — AB (ref 39.0–52.0)
Hemoglobin: 8.1 g/dL — ABNORMAL LOW (ref 13.0–17.0)
Hemoglobin: 8.3 g/dL — ABNORMAL LOW (ref 13.0–17.0)
MCH: 30.3 pg (ref 26.0–34.0)
MCH: 30.6 pg (ref 26.0–34.0)
MCHC: 32.8 g/dL (ref 30.0–36.0)
MCHC: 32.8 g/dL (ref 30.0–36.0)
MCV: 92.5 fL (ref 78.0–100.0)
MCV: 93.4 fL (ref 78.0–100.0)
Platelets: 164 10*3/uL (ref 150–400)
Platelets: 169 10*3/uL (ref 150–400)
RBC: 2.67 MIL/uL — AB (ref 4.22–5.81)
RBC: 2.71 MIL/uL — ABNORMAL LOW (ref 4.22–5.81)
RDW: 14.2 % (ref 11.5–15.5)
RDW: 14.3 % (ref 11.5–15.5)
WBC: 9.9 10*3/uL (ref 4.0–10.5)
WBC: 12.1 10*3/uL — ABNORMAL HIGH (ref 4.0–10.5)

## 2016-10-06 LAB — MAGNESIUM
Magnesium: 2.4 mg/dL (ref 1.7–2.4)
Magnesium: 2.3 mg/dL (ref 1.7–2.4)

## 2016-10-06 LAB — CREATININE, SERUM
Creatinine, Ser: 0.83 mg/dL (ref 0.61–1.24)
GFR calc Af Amer: >60 mL/min (ref >60)
GFR calc non Af Amer: >60 mL/min (ref >60)

## 2016-10-06 LAB — HEMOGLOBIN A1C
HEMOGLOBIN A1C: 5.3 % (ref 4.8–5.6)
Mean Plasma Glucose: 105 mg/dL

## 2016-10-06 LAB — GLUCOSE, CAPILLARY
GLUCOSE-CAPILLARY: 102 mg/dL — AB (ref 65–99)
Glucose-Capillary: 103 mg/dL — ABNORMAL HIGH (ref 65–99)

## 2016-10-06 MED ORDER — FUROSEMIDE 10 MG/ML IJ SOLN
40.0000 mg | Freq: Once | INTRAMUSCULAR | Status: AC
  Administered 2016-10-06: 40 mg via INTRAVENOUS
  Filled 2016-10-06: qty 4

## 2016-10-06 MED ORDER — LEVALBUTEROL HCL 0.63 MG/3ML IN NEBU
0.6300 mg | INHALATION_SOLUTION | RESPIRATORY_TRACT | Status: DC | PRN
  Administered 2016-10-06 – 2016-10-07 (×2): 0.63 mg via RESPIRATORY_TRACT
  Filled 2016-10-06 (×2): qty 3

## 2016-10-06 MED ORDER — INSULIN ASPART 100 UNIT/ML ~~LOC~~ SOLN
0.0000 [IU] | SUBCUTANEOUS | Status: DC
  Administered 2016-10-06 – 2016-10-08 (×6): 2 [IU] via SUBCUTANEOUS

## 2016-10-06 MED ORDER — ACETYLCYSTEINE 20 % IN SOLN
2.0000 mL | RESPIRATORY_TRACT | Status: AC
  Administered 2016-10-06 – 2016-10-07 (×4): 2 mL via RESPIRATORY_TRACT
  Filled 2016-10-06 (×2): qty 4
  Filled 2016-10-06: qty 2
  Filled 2016-10-06 (×2): qty 4

## 2016-10-06 MED ORDER — SODIUM CHLORIDE 0.9 % IV SOLN
0.0000 ug/min | INTRAVENOUS | Status: DC
  Administered 2016-10-06: 20 ug/min via INTRAVENOUS
  Filled 2016-10-06: qty 4

## 2016-10-06 MED ORDER — INSULIN ASPART 100 UNIT/ML ~~LOC~~ SOLN: 0.0000 [IU] | SUBCUTANEOUS | Status: DC

## 2016-10-06 MED ORDER — GUAIFENESIN ER 600 MG PO TB12
600.0000 mg | ORAL_TABLET | Freq: Two times a day (BID) | ORAL | Status: DC
  Administered 2016-10-06 – 2016-10-08 (×6): 600 mg via ORAL
  Filled 2016-10-06 (×5): qty 1

## 2016-10-06 NOTE — Progress Notes (Signed)
Patient ID: Devin Barton, male   DOB: 06-Dec-1945, 70 y.o.   MRN: [id number] TCTS DAILY ICU PROGRESS NOTE                   [address]            [place] [address]          [phone]   1 Day Post-Op Procedure(s) (LRB): CORONARY ARTERY BYPASS GRAFTING (CABG) x4 with FREE MAMMARY.  ENDOSCOPIC HARVESTING OF RIGHT SAPHENOUS VEIN. (FREE LIMA to LAD, SVG to OM, SVG SEQUENTIALLY to ACUTE MARGINAL and PDA) (N/A) TRANSESOPHAGEAL ECHOCARDIOGRAM (TEE) (N/A)  Total Length of Stay:  LOS: 2 days   Subjective: Extubated, neuro intact, underlying poor pulmonary function   Objective: Vital signs in last 24 hours: Temp:  [95.5 F (35.3 C)-101.5 F (38.6 C)] 98.4 F (36.9 C) (07/14 0700) Pulse Rate:  [75-108] 88 (07/14 0912) Cardiac Rhythm: Normal sinus rhythm (07/14 0600) Resp:  [12-25] 16 (07/14 0700) BP: (82-119)/(55-69) 112/66 (07/14 0912) SpO2:  [80 %-100 %] 98 % (07/14 0922) Arterial Line BP: (72-143)/(41-64) 113/59 (07/14 0700) FiO2 (%):  [40 %-50 %] 40 % (07/13 1736) Weight:  [137 lb 9.6 oz (62.4 kg)] 137 lb 9.6 oz (62.4 kg) (07/14 0500)  Filed Weights   10/04/16 1506 10/05/16 0428 10/06/16 0500  Weight: 128 lb 12 oz (58.4 kg) 122 lb 11.2 oz (55.7 kg) 137 lb 9.6 oz (62.4 kg)    Weight change: 9.6 oz (0.272 kg)   Hemodynamic parameters for last 24 hours: PAP: (18-30)/(7-18) 27/12 CO:  [3.1 L/min-6.3 L/min] 5.9 L/min CI:  [1.9 L/min/m2-3.8 L/min/m2] 3.6 L/min/m2  Intake/Output from previous day: 07/13 0701 - 07/14 0700 In: 8522.9 [P.O.:150; I.V.:5002.9; Blood:290; IV Piggyback:1000] Out: 8605 [HENID:7824; Emesis/NG output:30; Blood:1425; Chest Tube:420]  Intake/Output this shift: Total I/O In: 220 [I.V.:220] Out: 235 [Urine:225; Chest Tube:10]  Current Meds: Scheduled Meds: . acetaminophen  1,000 mg Oral Q6H   Or  . acetaminophen (TYLENOL) oral liquid 160 mg/5 mL  1,000 mg Per Tube Q6H  . aspirin EC  325 mg Oral Daily   Or  . aspirin  324 mg Per  Tube Daily  . atorvastatin  20 mg Oral q1800  . bisacodyl  10 mg Oral Daily   Or  . bisacodyl  10 mg Rectal Daily  . chlorhexidine gluconate (MEDLINE KIT)  15 mL Mouth Rinse BID  . Chlorhexidine Gluconate Cloth  6 each Topical Daily  . docusate sodium  200 mg Oral Daily  . feeding supplement (ENSURE ENLIVE)  237 mL Oral BID BM  . insulin aspart  0-24 Units Subcutaneous Q4H  . insulin regular  0-10 Units Intravenous TID WC  . levalbuterol  0.63 mg Nebulization Q6H  . mouth rinse  15 mL Mouth Rinse BID  . metoprolol tartrate  12.5 mg Oral BID   Or  . metoprolol tartrate  12.5 mg Per Tube BID  . [START ON 10/07/2016] pantoprazole  40 mg Oral Daily  . sodium chloride flush  10-40 mL Intracatheter Q12H  . sodium chloride flush  3 mL Intravenous Q12H  . tamsulosin  0.4 mg Oral QPM   Continuous Infusions: . sodium chloride 20 mL/hr at 10/06/16 0900  . sodium chloride    . sodium chloride 20 mL/hr at 10/06/16 0900  . albumin human    . cefUROXime (ZINACEF)  IV Stopped (10/05/16 2339)  . dexmedetomidine (PRECEDEX) IV infusion Stopped (10/05/16 1656)  . DOPamine 3 mcg/kg/min (10/06/16  0700)  . insulin (NOVOLIN-R) infusion    . lactated ringers    . lactated ringers 10 mL/hr at 10/06/16 0900  . lactated ringers 10 mL/hr at 10/05/16 1330  . nitroGLYCERIN Stopped (10/05/16 1330)  . phenylephrine (NEO-SYNEPHRINE) Adult infusion 70 mcg/min (10/06/16 0927)   PRN Meds:.sodium chloride, albumin human, ipratropium, lactated ringers, metoprolol tartrate, midazolam, morphine injection, ondansetron (ZOFRAN) IV, oxyCODONE, sodium chloride flush, sodium chloride flush, traMADol  General appearance: alert, cooperative and mild distress Neurologic: intact Heart: regular rate and rhythm, S1, S2 normal, no murmur, click, rub or gallop Lungs: rhonchi bilaterally Abdomen: soft, non-tender; bowel sounds normal; no masses,  no organomegaly Extremities: extremities normal, atraumatic, no cyanosis or edema  and Homans sign is negative, no sign of DVT Wound: sternum stable   Lab Results: CBC: Recent Labs  10/05/16 1930 10/06/16 0358  WBC 9.3 9.9  HGB 8.6* 8.1*  HCT 26.3* 24.7*  PLT 159 164   BMET:  Recent Labs  10/05/16 0310  10/05/16 1926 10/05/16 1930 10/06/16 0358  NA 133*  < > 139  --  136  K 4.1  < > 4.4  --  3.9  CL 103  < > 104  --  108  CO2 21*  --   --   --  25  GLUCOSE 79  < > 120*  --  103*  BUN 12  < > 7  --  7  CREATININE 0.83  < > 0.50* 0.70 0.70  CALCIUM 8.7*  --   --   --  7.3*  < > = values in this interval not displayed.  CMET: Lab Results  Component Value Date   WBC 9.9 10/06/2016   HGB 8.1 (L) 10/06/2016   HCT 24.7 (L) 10/06/2016   PLT 164 10/06/2016   GLUCOSE 103 (H) 10/06/2016   ALT 8 06/06/2012   AST 8 06/06/2012   NA 136 10/06/2016   K 3.9 10/06/2016   CL 108 10/06/2016   CREATININE 0.70 10/06/2016   BUN 7 10/06/2016   CO2 25 10/06/2016   TSH 0.794 06/06/2012   INR 1.48 10/05/2016   HGBA1C 5.3 10/04/2016      PT/INR:  Recent Labs  10/05/16 1341  LABPROT 18.1*  INR 1.48   Radiology: Dg Chest Port 1 View  Result Date: 10/06/2016 CLINICAL DATA:  Status post CABG surgery. EXAM: PORTABLE CHEST 1 VIEW COMPARISON:  10/05/2016 FINDINGS: Since prior study, the endotracheal tube and the nasogastric tube have been removed. The right internal jugular Swan-Ganz catheter, mediastinal tube and left chest tube remain in place, well positioned. There is opacity at the medial lung bases, mildly increased from the previous day's exam, consistent with atelectasis. Lungs are otherwise clear. No pneumothorax. Cardiac silhouette is normal in size.  No mediastinal widening. IMPRESSION: 1. No acute findings or evidence of an operative complication. No pulmonary edema, mediastinal widening or pneumothorax. 2. Remaining support apparatus is stable and well positioned. Electronically Signed   By: Lajean Manes M.D.   On: 10/06/2016 07:30   Dg Chest Port 1  View  Result Date: 10/05/2016 CLINICAL DATA:  S/P CABG x 4 EXAM: PORTABLE CHEST - 1 VIEW COMPARISON:  10/04/2016 FINDINGS: Interval median sternotomy and CABG. Endotracheal tube in place with tip 6.7 cm above carina. Nasogastric tube extends at least as far as the stomach, tip not seen. Left chest tube placed with no pneumothorax evident. Right IJ Swan-Ganz to the proximal pulmonary artery. Patchy subsegmental atelectasis in the left infrahilar region.  Lungs are otherwise clear. Heart size and mediastinal contours are within normal limits. Atheromatous aorta. No effusion. IMPRESSION: 1. Interval CABG with support hardware in expected location. 2. No pneumothorax. Electronically Signed   By: Lucrezia Europe M.D.   On: 10/05/2016 13:49     Assessment/Plan: S/P Procedure(s) (LRB): CORONARY ARTERY BYPASS GRAFTING (CABG) x4 with FREE MAMMARY.  ENDOSCOPIC HARVESTING OF RIGHT SAPHENOUS VEIN. 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

## 2016-10-06 NOTE — Progress Notes (Signed)
Dr. Tyrone SageGerhardt notified of pt's coarse crackles/exp wheezes.  Dr. Tyrone SageGerhardt at bedside.  Orders received.  Order to keep dopamine on and titrate neo.  Will continue to monitor closely.

## 2016-10-06 NOTE — Progress Notes (Signed)
Called RT to give patient PRN breathing treatment for patient complaining of SOB. Lung sounds rhonchi. Patient repositioned in bed. Will continue to monitor patient.  Horton ChinMacKayla A Huzaifa Viney, RN

## 2016-10-06 NOTE — Progress Notes (Signed)
Patient ID: Devin CostainFrank L Barton, male   DOB: 05/21/1945, 71 y.o.   MRN: [id number] EVENING ROUNDS NOTE :     [address]       [place] [id number]             [phone]                 1 Day Post-Op Procedure(s) (LRB): CORONARY ARTERY BYPASS GRAFTING (CABG) x4 with FREE MAMMARY.  ENDOSCOPIC HARVESTING OF RIGHT SAPHENOUS VEIN. (FREE LIMA to LAD, SVG to OM, SVG SEQUENTIALLY to ACUTE MARGINAL and PDA) (N/A) TRANSESOPHAGEAL ECHOCARDIOGRAM (TEE) (N/A)  Total Length of Stay:  LOS: 2 days  BP (!) 87/61   Pulse 97   Temp 98.2 F (36.8 C)   Resp 18   Ht 5\' 8"  (1.727 m)   Wt 137 lb 9.6 oz (62.4 kg)   SpO2 (!) 88%   BMI 20.92 kg/m   .Intake/Output      07/13 0701 - 07/14 0700 07/14 0701 - 07/15 0700   P.O. 150 1060   I.V. (mL/kg) 5002.9 (80.2) 966.3 (15.5)   Blood 290    Other 2080    IV Piggyback 1000 100   Total Intake(mL/kg) 8522.9 (136.6) 2126.3 (34.1)   Urine (mL/kg/hr) 6730 (4.5) 1180 (1.6)   Emesis/NG output 30    Blood 1425    Chest Tube 420 390   Total Output 8605 1570   Net -82.2 +556.3          . sodium chloride 20 mL/hr at 10/06/16 0900  . sodium chloride    . sodium chloride 20 mL/hr at 10/06/16 0900  . cefUROXime (ZINACEF)  IV Stopped (10/06/16 1109)  . DOPamine 3 mcg/kg/min (10/06/16 0700)  . lactated ringers    . lactated ringers 10 mL/hr at 10/06/16 0900  . lactated ringers 10 mL/hr at 10/05/16 1330  . nitroGLYCERIN Stopped (10/05/16 1330)  . phenylephrine (NEO-SYNEPHRINE) Adult infusion 60 mcg/min (10/06/16 1800)     Lab Results  Component Value Date   WBC 12.1 (H) 10/06/2016   HGB 8.3 (L) 10/06/2016   HCT 25.3 (L) 10/06/2016   PLT 169 10/06/2016   GLUCOSE 110 (H) 10/06/2016   ALT 8 06/06/2012   AST 8 06/06/2012   NA 137 10/06/2016   K 4.3 10/06/2016   CL 100 (L) 10/06/2016   CREATININE 0.83 10/06/2016   BUN 11 10/06/2016   CO2 25 10/06/2016   TSH 0.794 06/06/2012   INR 1.48 10/05/2016   HGBA1C 5.3 10/04/2016    Coughing Still needs intensive pulmonary toilet to prevent reintubation   Delight OvensEdward B Jujhar Everett MD  Beeper [phone] Office [phone] 10/06/2016 6:52 PM

## 2016-10-07 ENCOUNTER — Inpatient Hospital Stay (HOSPITAL_COMMUNITY): Payer: Medicare HMO

## 2016-10-07 LAB — GLUCOSE, CAPILLARY
GLUCOSE-CAPILLARY: 132 mg/dL — AB (ref 65–99)
GLUCOSE-CAPILLARY: 159 mg/dL — AB (ref 65–99)
GLUCOSE-CAPILLARY: 143 mg/dL — AB (ref 65–99)
Glucose-Capillary: 88 mg/dL (ref 65–99)
Glucose-Capillary: 91 mg/dL (ref 65–99)

## 2016-10-07 LAB — BASIC METABOLIC PANEL
Anion gap: 2 — ABNORMAL LOW (ref 5–15)
BUN: 10 mg/dL (ref 6–20)
CO2: 27 mmol/L (ref 22–32)
Calcium: 7.9 mg/dL — ABNORMAL LOW (ref 8.9–10.3)
Chloride: 104 mmol/L (ref 101–111)
Creatinine, Ser: 0.68 mg/dL (ref 0.61–1.24)
GFR calc Af Amer: >60 mL/min (ref >60)
GFR calc non Af Amer: >60 mL/min (ref >60)
Glucose, Bld: 110 mg/dL — ABNORMAL HIGH (ref 65–99)
Potassium: 4.3 mmol/L (ref 3.5–5.1)
Sodium: 133 mmol/L — ABNORMAL LOW (ref 135–145)

## 2016-10-07 LAB — CBC
HCT: 23.8 % — ABNORMAL LOW (ref 39.0–52.0)
Hemoglobin: 7.9 g/dL — ABNORMAL LOW (ref 13.0–17.0)
MCH: 30.7 pg (ref 26.0–34.0)
MCHC: 33.2 g/dL (ref 30.0–36.0)
MCV: 92.6 fL (ref 78.0–100.0)
Platelets: 149 10*3/uL — ABNORMAL LOW (ref 150–400)
RBC: 2.57 MIL/uL — ABNORMAL LOW (ref 4.22–5.81)
RDW: 14.2 % (ref 11.5–15.5)
WBC: 13.5 10*3/uL — ABNORMAL HIGH (ref 4.0–10.5)

## 2016-10-07 MED ORDER — ENOXAPARIN SODIUM 30 MG/0.3ML ~~LOC~~ SOLN
30.0000 mg | SUBCUTANEOUS | Status: DC
  Administered 2016-10-07 – 2016-10-12 (×6): 30 mg via SUBCUTANEOUS
  Filled 2016-10-07 (×6): qty 0.3

## 2016-10-07 MED ORDER — AMIODARONE LOAD VIA INFUSION
150.0000 mg | Freq: Once | INTRAVENOUS | Status: AC
  Administered 2016-10-07: 150 mg via INTRAVENOUS

## 2016-10-07 MED ORDER — AMIODARONE HCL 200 MG PO TABS: 400.0000 mg | ORAL_TABLET | Freq: Every day | ORAL | Status: DC

## 2016-10-07 MED ORDER — AMIODARONE HCL IN DEXTROSE 360-4.14 MG/200ML-% IV SOLN
30.0000 mg/h | INTRAVENOUS | Status: DC
  Filled 2016-10-07 (×2): qty 200

## 2016-10-07 MED ORDER — AMIODARONE HCL 200 MG PO TABS
400.0000 mg | ORAL_TABLET | Freq: Two times a day (BID) | ORAL | Status: DC
  Administered 2016-10-08 – 2016-10-12 (×9): 400 mg via ORAL
  Filled 2016-10-07 (×9): qty 2

## 2016-10-07 MED ORDER — ACETYLCYSTEINE 20 % IN SOLN
2.0000 mL | RESPIRATORY_TRACT | Status: AC
  Administered 2016-10-07 – 2016-10-08 (×3): 2 mL via RESPIRATORY_TRACT
  Filled 2016-10-07 (×4): qty 4

## 2016-10-07 MED ORDER — AMIODARONE HCL IN DEXTROSE 360-4.14 MG/200ML-% IV SOLN
60.0000 mg/h | INTRAVENOUS | Status: AC
  Administered 2016-10-07: 60 mg/h via INTRAVENOUS

## 2016-10-07 MED ORDER — AMIODARONE HCL IN DEXTROSE 360-4.14 MG/200ML-% IV SOLN
INTRAVENOUS | Status: AC
  Filled 2016-10-07: qty 200

## 2016-10-07 NOTE — Progress Notes (Signed)
Pt assisted to recliner from bed.  Afib RVR noted 140 bpm.  Dr. Tyrone SageGerhardt notified of rhythm change.

## 2016-10-07 NOTE — Progress Notes (Signed)
  Amiodarone Drug - Drug Interaction Consult Note  Recommendations:  Patient is currently on ondansetron, atorvastatin, and metoprolol which can interact with amiodarone. Monitor pt for QTc prolongation, myalgias and bradycardia.   Amiodarone is metabolized by the cytochrome P450 system and therefore has the potential to cause many drug interactions. Amiodarone has an average plasma half-life of 50 days (range 20 to 100 days).   There is potential for drug interactions to occur several weeks or months after stopping treatment and the onset of drug interactions may be slow after initiating amiodarone.   [x]  Statins: Increased risk of myopathy. Simvastatin- restrict dose to 20mg  daily. Other statins: counsel patients to report any muscle pain or weakness immediately.  []  Anticoagulants: Amiodarone can increase anticoagulant effect. Consider warfarin dose reduction. Patients should be monitored closely and the dose of anticoagulant altered accordingly, remembering that amiodarone levels take several weeks to stabilize.  []  Antiepileptics: Amiodarone can increase plasma concentration of phenytoin, the dose should be reduced. Note that small changes in phenytoin dose can result in large changes in levels. Monitor patient and counsel on signs of toxicity.  [x]  Beta blockers: increased risk of bradycardia, AV block and myocardial depression. Sotalol - avoid concomitant use.  []   Calcium channel blockers (diltiazem and verapamil): increased risk of bradycardia, AV block and myocardial depression.  []   Cyclosporine: Amiodarone increases levels of cyclosporine. Reduced dose of cyclosporine is recommended.  []  Digoxin dose should be halved when amiodarone is started.  []  Diuretics: increased risk of cardiotoxicity if hypokalemia occurs.  []  Oral hypoglycemic agents (glyburide, glipizide, glimepiride): increased risk of hypoglycemia. Patient's glucose levels should be monitored closely when initiating  amiodarone therapy.   [x]  Drugs that prolong the QT interval:  Torsades de pointes risk may be increased with concurrent use - avoid if possible.  Monitor QTc, also keep magnesium/potassium WNL if concurrent therapy can't be avoided. Marland Kitchen. Antibiotics: e.g. fluoroquinolones, erythromycin. . Antiarrhythmics: e.g. quinidine, procainamide, disopyramide, sotalol. . Antipsychotics: e.g. phenothiazines, haloperidol.  . Lithium, tricyclic antidepressants, and methadone.  Thank You,  Arman FilterClark, Loistine Eberlin Prescott  10/07/2016 6:45 PM

## 2016-10-07 NOTE — Progress Notes (Signed)
Patient ID: Devin Barton, male   DOB: 01-15-1946, 71 y.o.   MRN: [id number] TCTS DAILY ICU PROGRESS NOTE                   [address]            [place] [address]          [phone]   2 Days Post-Op Procedure(s) (LRB): CORONARY ARTERY BYPASS GRAFTING (CABG) x4 with FREE MAMMARY.  ENDOSCOPIC HARVESTING OF RIGHT SAPHENOUS VEIN. (FREE LIMA to LAD, SVG to OM, SVG SEQUENTIALLY to ACUTE MARGINAL and PDA) (N/A) TRANSESOPHAGEAL ECHOCARDIOGRAM (TEE) (N/A)  Total Length of Stay:  LOS: 3 days   Subjective: Up to chair , waked little Objective: Vital signs in last 24 hours: Temp:  [97.5 F (36.4 C)-98.4 F (36.9 C)] 97.6 F (36.4 C) (07/15 0700) Pulse Rate:  [70-103] 88 (07/15 0945) Cardiac Rhythm: Normal sinus rhythm (07/15 0755) Resp:  [12-26] 18 (07/15 1000) BP: (87-123)/(56-97) 95/56 (07/15 1000) SpO2:  [84 %-100 %] 100 % (07/15 0945) Arterial Line BP: (63-133)/(43-99) 72/68 (07/15 1000) FiO2 (%):  [4 %] 4 % (07/15 0400) Weight:  [141 lb 5 oz (64.1 kg)] 141 lb 5 oz (64.1 kg) (07/15 0540)  Filed Weights   10/05/16 0428 10/06/16 0500 10/07/16 0540  Weight: 122 lb 11.2 oz (55.7 kg) 137 lb 9.6 oz (62.4 kg) 141 lb 5 oz (64.1 kg)    Weight change: 3 lb 11.4 oz (1.685 kg)   Hemodynamic parameters for last 24 hours: PAP: (24)/(13) 24/13  Intake/Output from previous day: 07/14 0701 - 07/15 0700 In: 3349.3 [P.O.:1300; I.V.:1949.3; IV Piggyback:100] Out: 2650 [Urine:2120; Chest Tube:530]  Intake/Output this shift: Total I/O In: 313.8 [P.O.:240; I.V.:73.8] Out: -   Current Meds: Scheduled Meds: . acetaminophen  1,000 mg Oral Q6H   Or  . acetaminophen (TYLENOL) oral liquid 160 mg/5 mL  1,000 mg Per Tube Q6H  . acetylcysteine  2 mL Nebulization Q4H  . aspirin EC  325 mg Oral Daily   Or  . aspirin  324 mg Per Tube Daily  . atorvastatin  20 mg Oral q1800  . bisacodyl  10 mg Oral Daily   Or  . bisacodyl  10 mg Rectal Daily  . chlorhexidine gluconate  (MEDLINE KIT)  15 mL Mouth Rinse BID  . Chlorhexidine Gluconate Cloth  6 each Topical Daily  . docusate sodium  200 mg Oral Daily  . feeding supplement (ENSURE ENLIVE)  237 mL Oral BID BM  . guaiFENesin  600 mg Oral BID  . insulin aspart  0-24 Units Subcutaneous Q4H  . insulin regular  0-10 Units Intravenous TID WC  . levalbuterol  0.63 mg Nebulization Q6H  . mouth rinse  15 mL Mouth Rinse BID  . metoprolol tartrate  12.5 mg Oral BID   Or  . metoprolol tartrate  12.5 mg Per Tube BID  . pantoprazole  40 mg Oral Daily  . sodium chloride flush  10-40 mL Intracatheter Q12H  . sodium chloride flush  3 mL Intravenous Q12H  . tamsulosin  0.4 mg Oral QPM   Continuous Infusions: . sodium chloride Stopped (10/06/16 2100)  . sodium chloride    . sodium chloride 20 mL/hr at 10/06/16 2000  . cefUROXime (ZINACEF)  IV Stopped (10/06/16 2306)  . DOPamine 3 mcg/kg/min (10/06/16 0700)  . lactated ringers    . lactated ringers Stopped (10/06/16 2100)  . lactated ringers 10 mL/hr at 10/05/16 1330  .  nitroGLYCERIN Stopped (10/05/16 1330)  . phenylephrine (NEO-SYNEPHRINE) Adult infusion 30 mcg/min (10/07/16 0614)   PRN Meds:.sodium chloride, ipratropium, lactated ringers, levalbuterol, metoprolol tartrate, midazolam, morphine injection, ondansetron (ZOFRAN) IV, oxyCODONE, sodium chloride flush, sodium chloride flush, traMADol  General appearance: alert, cooperative, appears older than stated age and no distress Neurologic: intact Heart: regular rate and rhythm, S1, S2 normal, no murmur, click, rub or gallop Lungs: rhonchi bilaterally and upper air way ronchi Abdomen: soft, non-tender; bowel sounds normal; no masses,  no organomegaly Extremities: extremities normal, atraumatic, no cyanosis or edema and Homans sign is negative, no sign of DVT Wound: sternum intact  Lab Results: CBC: Recent Labs  10/06/16 1634 10/07/16 0519  WBC 12.1* 13.5*  HGB 8.3* 7.9*  HCT 25.3* 23.8*  PLT 169 149*    BMET:  Recent Labs  10/06/16 0358 10/06/16 1619 10/06/16 1634 10/07/16 0519  NA 136 137  --  133*  K 3.9 4.3  --  4.3  CL 108 100*  --  104  CO2 25  --   --  27  GLUCOSE 103* 110*  --  110*  BUN 7 11  --  10  CREATININE 0.70 0.60* 0.83 0.68  CALCIUM 7.3*  --   --  7.9*    CMET: Lab Results  Component Value Date   WBC 13.5 (H) 10/07/2016   HGB 7.9 (L) 10/07/2016   HCT 23.8 (L) 10/07/2016   PLT 149 (L) 10/07/2016   GLUCOSE 110 (H) 10/07/2016   ALT 8 06/06/2012   AST 8 06/06/2012   NA 133 (L) 10/07/2016   K 4.3 10/07/2016   CL 104 10/07/2016   CREATININE 0.68 10/07/2016   BUN 10 10/07/2016   CO2 27 10/07/2016   TSH 0.794 06/06/2012   INR 1.48 10/05/2016   HGBA1C 5.3 10/04/2016      PT/INR:  Recent Labs  10/05/16 1341  LABPROT 18.1*  INR 1.48   Radiology: Dg Chest Port 1 View  Result Date: 10/07/2016 CLINICAL DATA:  Followup recent CABG. EXAM: PORTABLE CHEST 1 VIEW COMPARISON:  Yesterday. FINDINGS: The right jugular Swan-Ganz catheter has been removed and the sheath remains in place. Mediastinal and left chest tubes remain in place. Possible tiny left apical pneumothorax. Mildly increased bibasilar airspace opacity. The cardiac silhouette remains borderline enlarged and post CABG changes are stable. Unremarkable bones. IMPRESSION: 1. Possible tiny left apical pneumothorax. 2. Mildly increased bibasilar atelectasis. Electronically Signed   By: Claudie Revering M.D.   On: 10/07/2016 07:45     Assessment/Plan: S/P Procedure(s) (LRB): CORONARY ARTERY BYPASS GRAFTING (CABG) x4 with FREE MAMMARY.  ENDOSCOPIC HARVESTING OF RIGHT SAPHENOUS VEIN. (FREE LIMA to LAD, SVG to OM, SVG SEQUENTIALLY to ACUTE MARGINAL and PDA) (N/A) TRANSESOPHAGEAL ECHOCARDIOGRAM (TEE) (N/A) Mobilize Diuresis D/c foley and mediastinal tube leave ct for another day Continue pulmonary toilet  Expected Acute  Blood - loss Anemia   Grace Isaac 10/07/2016 10:27 AM

## 2016-10-07 NOTE — Progress Notes (Signed)
Pt ambulated 370 ft.  Afib RVR noted on monitor.  Pt assisted back to bed.  Pt converted self with rest to NSR 80.  No distress noted.  Will continue to monitor closely.

## 2016-10-07 NOTE — Progress Notes (Signed)
Patient ID: Davene CostainFrank L Barton, male   DOB: 1945/09/03, 71 y.o.   MRN: [id number] EVENING ROUNDS NOTE :     [address]       [place] [id number]             [phone]                 2 Days Post-Op Procedure(s) (LRB): CORONARY ARTERY BYPASS GRAFTING (CABG) x4 with FREE MAMMARY.  ENDOSCOPIC HARVESTING OF RIGHT SAPHENOUS VEIN. (FREE LIMA to LAD, SVG to OM, SVG SEQUENTIALLY to ACUTE MARGINAL and PDA) (N/A) TRANSESOPHAGEAL ECHOCARDIOGRAM (TEE) (N/A)  Total Length of Stay:  LOS: 3 days  BP 117/71   Pulse (!) 101   Temp 97.8 F (36.6 C) (Oral)   Resp 18   Ht 5\' 8"  (1.727 m)   Wt 141 lb 5 oz (64.1 kg)   SpO2 98%   BMI 21.49 kg/m   .Intake/Output      07/14 0701 - 07/15 0700 07/15 0701 - 07/16 0700   P.O. 1300 480   I.V. (mL/kg) 1949.3 (30.4) 224 (3.5)   IV Piggyback 100 100   Total Intake(mL/kg) 3349.3 (52.3) 804 (12.5)   Urine (mL/kg/hr) 2120 (1.4) 300 (0.4)   Chest Tube 530 20   Total Output 2650 320   Net +699.3 +484          . sodium chloride Stopped (10/06/16 2100)  . sodium chloride    . sodium chloride 20 mL/hr at 10/06/16 2000  . DOPamine 1.5 mcg/kg/min (10/07/16 1114)  . lactated ringers    . lactated ringers Stopped (10/06/16 2100)  . lactated ringers 10 mL/hr at 10/05/16 1330  . nitroGLYCERIN Stopped (10/05/16 1330)  . phenylephrine (NEO-SYNEPHRINE) Adult infusion 10 mcg/min (10/07/16 1700)     Lab Results  Component Value Date   WBC 13.5 (H) 10/07/2016   HGB 7.9 (L) 10/07/2016   HCT 23.8 (L) 10/07/2016   PLT 149 (L) 10/07/2016   GLUCOSE 110 (H) 10/07/2016   ALT 8 06/06/2012   AST 8 06/06/2012   NA 133 (L) 10/07/2016   K 4.3 10/07/2016   CL 104 10/07/2016   CREATININE 0.68 10/07/2016   BUN 10 10/07/2016   CO2 27 10/07/2016   TSH 0.794 06/06/2012   INR 1.48 10/05/2016   HGBA1C 5.3 10/04/2016   Episodes of afib  Wean off dopamine  Started iv Cordarone   Delight OvensEdward B Sharday Michl MD  Beeper [phone] Office [phone] 10/07/2016 6:29  PM

## 2016-10-08 ENCOUNTER — Encounter (HOSPITAL_COMMUNITY): Payer: Medicare HMO

## 2016-10-08 ENCOUNTER — Inpatient Hospital Stay (HOSPITAL_COMMUNITY): Payer: Medicare HMO

## 2016-10-08 ENCOUNTER — Encounter (HOSPITAL_COMMUNITY): Payer: Self-pay | Admitting: Thoracic Surgery (Cardiothoracic Vascular Surgery)

## 2016-10-08 LAB — HEPATIC FUNCTION PANEL
ALT: 15 U/L — ABNORMAL LOW (ref 17–63)
AST: 22 U/L (ref 15–41)
Albumin: 2.6 g/dL — ABNORMAL LOW (ref 3.5–5.0)
Alkaline Phosphatase: 42 U/L (ref 38–126)
Bilirubin, Direct: 0.1 mg/dL (ref 0.1–0.5)
Indirect Bilirubin: 0.1 mg/dL — ABNORMAL LOW (ref 0.3–0.9)
Total Bilirubin: 0.2 mg/dL — ABNORMAL LOW (ref 0.3–1.2)
Total Protein: 4.6 g/dL — ABNORMAL LOW (ref 6.5–8.1)

## 2016-10-08 LAB — GLUCOSE, CAPILLARY
GLUCOSE-CAPILLARY: 132 mg/dL — AB (ref 65–99)
GLUCOSE-CAPILLARY: 107 mg/dL — AB (ref 65–99)
Glucose-Capillary: 101 mg/dL — ABNORMAL HIGH (ref 65–99)
Glucose-Capillary: 123 mg/dL — ABNORMAL HIGH (ref 65–99)
Glucose-Capillary: 128 mg/dL — ABNORMAL HIGH (ref 65–99)
Glucose-Capillary: 135 mg/dL — ABNORMAL HIGH (ref 65–99)
Glucose-Capillary: 261 mg/dL — ABNORMAL HIGH (ref 65–99)
Glucose-Capillary: 96 mg/dL (ref 65–99)

## 2016-10-08 LAB — BASIC METABOLIC PANEL
Anion gap: 3 — ABNORMAL LOW (ref 5–15)
BUN: 12 mg/dL (ref 6–20)
CO2: 27 mmol/L (ref 22–32)
Calcium: 7.7 mg/dL — ABNORMAL LOW (ref 8.9–10.3)
Chloride: 98 mmol/L — ABNORMAL LOW (ref 101–111)
Creatinine, Ser: 0.69 mg/dL (ref 0.61–1.24)
GFR calc Af Amer: >60 mL/min (ref >60)
GFR calc non Af Amer: >60 mL/min (ref >60)
Glucose, Bld: 132 mg/dL — ABNORMAL HIGH (ref 65–99)
Potassium: 4.2 mmol/L (ref 3.5–5.1)
Sodium: 128 mmol/L — ABNORMAL LOW (ref 135–145)

## 2016-10-08 LAB — CBC
HCT: 21.7 % — ABNORMAL LOW (ref 39.0–52.0)
Hemoglobin: 7.2 g/dL — ABNORMAL LOW (ref 13.0–17.0)
MCH: 31 pg (ref 26.0–34.0)
MCHC: 33.2 g/dL (ref 30.0–36.0)
MCV: 93.5 fL (ref 78.0–100.0)
Platelets: 136 10*3/uL — ABNORMAL LOW (ref 150–400)
RBC: 2.32 MIL/uL — ABNORMAL LOW (ref 4.22–5.81)
RDW: 14.1 % (ref 11.5–15.5)
WBC: 10.5 10*3/uL (ref 4.0–10.5)

## 2016-10-08 LAB — PREPARE RBC (CROSSMATCH)

## 2016-10-08 LAB — TSH: TSH: 5.543 u[IU]/mL — ABNORMAL HIGH (ref 0.350–4.500)

## 2016-10-08 LAB — MAGNESIUM: Magnesium: 1.9 mg/dL (ref 1.7–2.4)

## 2016-10-08 MED ORDER — FUROSEMIDE 10 MG/ML IJ SOLN
20.0000 mg | Freq: Once | INTRAMUSCULAR | Status: AC
  Administered 2016-10-08: 20 mg via INTRAVENOUS
  Filled 2016-10-08: qty 2

## 2016-10-08 MED ORDER — LEVALBUTEROL HCL 0.63 MG/3ML IN NEBU
0.6300 mg | INHALATION_SOLUTION | RESPIRATORY_TRACT | Status: DC | PRN
  Administered 2016-10-09 (×3): 0.63 mg via RESPIRATORY_TRACT
  Filled 2016-10-08 (×4): qty 3

## 2016-10-08 MED ORDER — SODIUM CHLORIDE 0.9 % IV SOLN
Freq: Once | INTRAVENOUS | Status: AC
  Administered 2016-10-08: 15:00:00 via INTRAVENOUS

## 2016-10-08 MED ORDER — FUROSEMIDE 10 MG/ML IJ SOLN
40.0000 mg | Freq: Once | INTRAMUSCULAR | Status: AC
  Administered 2016-10-08: 40 mg via INTRAVENOUS
  Filled 2016-10-08: qty 4

## 2016-10-08 MED FILL — Sodium Chloride IV Soln 0.9%: INTRAVENOUS | Qty: 2000 | Status: AC

## 2016-10-08 MED FILL — Electrolyte-R (PH 7.4) Solution: INTRAVENOUS | Qty: 5000 | Status: AC

## 2016-10-08 MED FILL — Sodium Bicarbonate IV Soln 8.4%: INTRAVENOUS | Qty: 50 | Status: AC

## 2016-10-08 MED FILL — Mannitol IV Soln 20%: INTRAVENOUS | Qty: 500 | Status: AC

## 2016-10-08 MED FILL — Heparin Sodium (Porcine) Inj 1000 Unit/ML: INTRAMUSCULAR | Qty: 10 | Status: AC

## 2016-10-08 MED FILL — Lidocaine HCl IV Inj 20 MG/ML: INTRAVENOUS | Qty: 5 | Status: AC

## 2016-10-08 NOTE — Progress Notes (Signed)
  Amiodarone Drug - Drug Interaction Consult Note  Recommendations: Monitor for bradycardia, AV block, myocardial depression on Lopressor. Monitor for s/sx of muscle pain, weakness on statin. Amiodarone is metabolized by the cytochrome P450 system and therefore has the potential to cause many drug interactions. Amiodarone has an average plasma half-life of 50 days (range 20 to 100 days).   There is potential for drug interactions to occur several weeks or months after stopping treatment and the onset of drug interactions may be slow after initiating amiodarone.   [x]  Statins: Increased risk of myopathy. Simvastatin- restrict dose to 20mg  daily. Other statins: counsel patients to report any muscle pain or weakness immediately. *Atorvastatin  []  Anticoagulants: Amiodarone can increase anticoagulant effect. Consider warfarin dose reduction. Patients should be monitored closely and the dose of anticoagulant altered accordingly, remembering that amiodarone levels take several weeks to stabilize.  []  Antiepileptics: Amiodarone can increase plasma concentration of phenytoin, the dose should be reduced. Note that small changes in phenytoin dose can result in large changes in levels. Monitor patient and counsel on signs of toxicity.  [x]  Beta blockers: increased risk of bradycardia, AV block and myocardial depression. Sotalol - avoid concomitant use. *Lopressor  []   Calcium channel blockers (diltiazem and verapamil): increased risk of bradycardia, AV block and myocardial depression.  []   Cyclosporine: Amiodarone increases levels of cyclosporine. Reduced dose of cyclosporine is recommended.  []  Digoxin dose should be halved when amiodarone is started.  []  Diuretics: increased risk of cardiotoxicity if hypokalemia occurs.  []  Oral hypoglycemic agents (glyburide, glipizide, glimepiride): increased risk of hypoglycemia. Patient's glucose levels should be monitored closely when initiating amiodarone  therapy.   []  Drugs that prolong the QT interval:  Torsades de pointes risk may be increased with concurrent use - avoid if possible.  Monitor QTc, also keep magnesium/potassium WNL if concurrent therapy can't be avoided. Marland Kitchen. Antibiotics: e.g. fluoroquinolones, erythromycin. . Antiarrhythmics: e.g. quinidine, procainamide, disopyramide, sotalol. . Antipsychotics: e.g. phenothiazines, haloperidol.  . Lithium, tricyclic antidepressants, and methadone. Thank You,  Babs BertinHaley Naureen Benton, PharmD, BCPS Clinical Pharmacist Rx Phone # for today: [phone] After 3:30PM, please call Main Rx: [phone]#[id number] 10/08/2016 10:51 AM

## 2016-10-08 NOTE — Progress Notes (Addendum)
TCTS DAILY ICU PROGRESS NOTE                   [address]            [place] [address]          [phone]   3 Days Post-Op Procedure(s) (LRB): CORONARY ARTERY BYPASS GRAFTING (CABG) x4 with FREE MAMMARY.  ENDOSCOPIC HARVESTING OF RIGHT SAPHENOUS VEIN. (FREE LIMA to LAD, SVG to OM, SVG SEQUENTIALLY to ACUTE MARGINAL and PDA) (N/A) TRANSESOPHAGEAL ECHOCARDIOGRAM (TEE) (N/A)  Total Length of Stay:  LOS: 4 days   Subjective: Feels okay this morning. Has been asking to get back in bed.   Objective: Vital signs in last 24 hours: Temp:  [96.8 F (36 C)-98.4 F (36.9 C)] 97.7 F (36.5 C) (07/16 0746) Pulse Rate:  [25-106] 93 (07/16 0800) Cardiac Rhythm: Normal sinus rhythm (07/16 0800) Resp:  [13-22] 17 (07/16 0800) BP: (78-128)/(48-79) 117/60 (07/16 0800) SpO2:  [84 %-100 %] 95 % (07/16 0800) Arterial Line BP: (59-124)/(55-112) 116/112 (07/15 1115) Weight:  [64.9 kg (143 lb 1.6 oz)] 64.9 kg (143 lb 1.6 oz) (07/16 0600)  Filed Weights   10/06/16 0500 10/07/16 0540 10/08/16 0600  Weight: 62.4 kg (137 lb 9.6 oz) 64.1 kg (141 lb 5 oz) 64.9 kg (143 lb 1.6 oz)    Weight change: 0.81 kg (1 lb 12.6 oz)     Intake/Output from previous day: 07/15 0701 - 07/16 0700 In: 1399.8 [P.O.:720; I.V.:579.8; IV Piggyback:100] Out: 800 [Urine:770; Chest Tube:30]  Intake/Output this shift: Total I/O In: 16.7 [I.V.:16.7] Out: -   Current Meds: Scheduled Meds: . acetaminophen  1,000 mg Oral Q6H   Or  . acetaminophen (TYLENOL) oral liquid 160 mg/5 mL  1,000 mg Per Tube Q6H  . amiodarone  400 mg Oral Q12H   Followed by  . [START ON 10/15/2016] amiodarone  400 mg Oral Daily  . aspirin EC  325 mg Oral Daily   Or  . aspirin  324 mg Per Tube Daily  . atorvastatin  20 mg Oral q1800  . bisacodyl  10 mg Oral Daily   Or  . bisacodyl  10 mg Rectal Daily  . chlorhexidine gluconate (MEDLINE KIT)  15 mL Mouth Rinse BID  . Chlorhexidine Gluconate Cloth  6 each Topical Daily  .  docusate sodium  200 mg Oral Daily  . enoxaparin (LOVENOX) injection  30 mg Subcutaneous Q24H  . feeding supplement (ENSURE ENLIVE)  237 mL Oral BID BM  . guaiFENesin  600 mg Oral BID  . insulin aspart  0-24 Units Subcutaneous Q4H  . levalbuterol  0.63 mg Nebulization Q6H  . mouth rinse  15 mL Mouth Rinse BID  . metoprolol tartrate  12.5 mg Oral BID   Or  . metoprolol tartrate  12.5 mg Per Tube BID  . pantoprazole  40 mg Oral Daily  . sodium chloride flush  10-40 mL Intracatheter Q12H  . sodium chloride flush  3 mL Intravenous Q12H  . tamsulosin  0.4 mg Oral QPM   Continuous Infusions: . sodium chloride Stopped (10/06/16 2100)  . sodium chloride    . sodium chloride 10 mL/hr at 10/07/16 2000  . amiodarone 30 mg/hr (10/08/16 0045)  . lactated ringers    . lactated ringers Stopped (10/06/16 2100)  . lactated ringers 10 mL/hr at 10/05/16 1330  . nitroGLYCERIN Stopped (10/05/16 1330)  . phenylephrine (NEO-SYNEPHRINE) Adult infusion 10 mcg/min (10/07/16 1700)   PRN Meds:.sodium chloride, ipratropium,  lactated ringers, levalbuterol, metoprolol tartrate, midazolam, morphine injection, ondansetron (ZOFRAN) IV, oxyCODONE, sodium chloride flush, sodium chloride flush, traMADol  General appearance: alert, cooperative and no distress Heart: regular rate and rhythm, S1, S2 normal, no murmur, click, rub or gallop Lungs: clear to auscultation bilaterally and rhonchi in all fields Abdomen: soft, non-tender; bowel sounds normal; no masses,  no organomegaly Extremities: extremities normal, atraumatic, no cyanosis or edema Wound: clean and dry  Lab Results: CBC: Recent Labs  10/06/16 1634 10/07/16 0519  WBC 12.1* 13.5*  HGB 8.3* 7.9*  HCT 25.3* 23.8*  PLT 169 149*   BMET:  Recent Labs  10/06/16 0358 10/06/16 1619 10/06/16 1634 10/07/16 0519  NA 136 137  --  133*  K 3.9 4.3  --  4.3  CL 108 100*  --  104  CO2 25  --   --  27  GLUCOSE 103* 110*  --  110*  BUN 7 11  --  10    CREATININE 0.70 0.60* 0.83 0.68  CALCIUM 7.3*  --   --  7.9*    CMET: Lab Results  Component Value Date   WBC 13.5 (H) 10/07/2016   HGB 7.9 (L) 10/07/2016   HCT 23.8 (L) 10/07/2016   PLT 149 (L) 10/07/2016   GLUCOSE 110 (H) 10/07/2016   ALT 8 06/06/2012   AST 8 06/06/2012   NA 133 (L) 10/07/2016   K 4.3 10/07/2016   CL 104 10/07/2016   CREATININE 0.68 10/07/2016   BUN 10 10/07/2016   CO2 27 10/07/2016   TSH 0.794 06/06/2012   INR 1.48 10/05/2016   HGBA1C 5.3 10/04/2016      PT/INR:  Recent Labs  10/05/16 1341  LABPROT 18.1*  INR 1.48   Radiology: Dg Chest Port 1 View  Result Date: 10/08/2016 CLINICAL DATA:  Recent chest tube removal. EXAM: PORTABLE CHEST 1 VIEW COMPARISON:  10/07/2016 . FINDINGS: Right IJ sheath in stable position. Left chest tube in stable position. Very minimal residual left apical pneumothorax. Prior CABG. Heart size stable. Progressive bibasilar atelectasis. Bibasilar infiltrates/edema noted on today's exam. Small bilateral pleural effusions. No pneumothorax. IMPRESSION: 1. Right IJ sheath and left chest tube in stable position. Very minimal residual left apical pneumothorax. 2.  Prior CABG.  Heart size stable. 3. Progressive bibasilar atelectasis. Bibasilar infiltrates/ edema noted on today's exam. Small bilateral pleural effusions. A component of mild CHF may be present . Electronically Signed   By: Marcello Moores  Register   On: 10/08/2016 07:16     Assessment/Plan: S/P Procedure(s) (LRB): CORONARY ARTERY BYPASS GRAFTING (CABG) x4 with FREE MAMMARY.  ENDOSCOPIC HARVESTING OF RIGHT SAPHENOUS VEIN. (FREE LIMA to LAD, SVG to OM, SVG SEQUENTIALLY to ACUTE MARGINAL and PDA) (N/A) TRANSESOPHAGEAL ECHOCARDIOGRAM (TEE) (N/A)   1. CV-currently in NSR in the 90s. Was in atrial fibrillation last night and started on IV Amio transition to PO Amio. BP had been soft but improving. Off dopamine. On Lopressor 12.67m BID.  2. Pulm-weaned down to 1L [place] with excellent  oxygen saturation. CXR from this morning showed small bilateral pleural effusions. Progressive bibasilar atelectasis. Encourage incentive spirometry and pulm toilet. Continue nebs.  3. Renal-creatinine 0.68, electrolytes okay. Making good urine, however his weight is trending up. Continue Flomax.  4. H and H holding steady, platelets are stable 5. Endo-needs better blood glucose control. On SSI.  6. Lovenox for DVT proph 7. Pain control- on morphine, oxycodone, and ultram.   Plan: Discontinue remaining chest tube. Will give Lasix x 1  today for fluid overload. Continue to wean oxygen as tolerated. Transition Amio from IV to PO. Order labs.     Elgie Collard 10/08/2016 8:46 AM    I have seen and examined the patient and agree with the assessment and plan as outlined.  Episode Afib overnight.  Maintaining NSR on amiodarone.  D/C chest tubes, foley.  Needs mobilization, diuresis.  Check labs which weren't ordered for this morning to f/u anemia.  Rexene Alberts, MD 10/08/2016

## 2016-10-08 NOTE — Progress Notes (Signed)
Pt c/o difficulty breathing, O2 100% on 5L nasal canula, RT notified that pt requiring breathing treatment. Cough and deep breathing exercises performed after which pt reported less difficulty breathing. Will continue to monitor.

## 2016-10-08 NOTE — Progress Notes (Signed)
TCTS BRIEF SICU PROGRESS NOTE  3 Days Post-Op  S/P Procedure(s) (LRB): CORONARY ARTERY BYPASS GRAFTING (CABG) x4 with FREE MAMMARY.  ENDOSCOPIC HARVESTING OF RIGHT SAPHENOUS VEIN. (FREE LIMA to LAD, SVG to OM, SVG SEQUENTIALLY to ACUTE MARGINAL and PDA) (N/A) TRANSESOPHAGEAL ECHOCARDIOGRAM (TEE) (N/A)   Stable day Remains in NSR all day except brief episode Afib during walk Foley catheter replaced due to bladder outlet obstruction, 1800 mL urine out after replaced Received 1 of 2 units PRBC's for severe symptomatic anemia  Plan: Continue current plan  Purcell Nailslarence H Owen, MD 10/08/2016 7:06 PM

## 2016-10-08 NOTE — Procedures (Signed)
Extubation Procedure Note  Patient Details:   Name: Devin CostainFrank L Taira DOB: 1945/06/23 MRN: [id number]   Airway Documentation:     Evaluation  O2 sats: stable throughout Complications: No apparent complications Patient did tolerate procedure well. Bilateral Breath Sounds: Rhonchi, Diminished   Yes  Patient tolerated rapid wean. NIF -18 and VC 0.9 L. MD notified and ordered to continue with extubation. Positive for cuff leak. Patient extubated to a 4 Lpm nasal cannula. No signs of dyspnea or stridor. Patient unable to make a tight seal on Incentive Spirometer, however, was instructed how to use and tried several times. RN at bedside.   Ancil BoozerSmallwood, Tokiko Diefenderfer 10/08/2016, 11:09 AM

## 2016-10-09 ENCOUNTER — Inpatient Hospital Stay (HOSPITAL_COMMUNITY): Payer: Medicare HMO

## 2016-10-09 LAB — BPAM RBC
BLOOD PRODUCT EXPIRATION DATE: 201807312359
Blood Product Expiration Date: 201807312359
ISSUE DATE / TIME: 201807161446
ISSUE DATE / TIME: 201807161956
Unit Type and Rh: 6200
Unit Type and Rh: 6200

## 2016-10-09 LAB — CBC
HCT: 26.4 % — ABNORMAL LOW (ref 39.0–52.0)
HEMATOCRIT: 29.6 % — AB (ref 39.0–52.0)
HEMATOCRIT: 28.8 % — AB (ref 39.0–52.0)
HEMOGLOBIN: 8.7 g/dL — AB (ref 13.0–17.0)
HEMOGLOBIN: 9.9 g/dL — AB (ref 13.0–17.0)
Hemoglobin: 9.5 g/dL — ABNORMAL LOW (ref 13.0–17.0)
MCH: 29.7 pg (ref 26.0–34.0)
MCH: 29.4 pg (ref 26.0–34.0)
MCH: 29.6 pg (ref 26.0–34.0)
MCHC: 33 g/dL (ref 30.0–36.0)
MCHC: 33 g/dL (ref 30.0–36.0)
MCHC: 33.4 g/dL (ref 30.0–36.0)
MCV: 89.2 fL (ref 78.0–100.0)
MCV: 88.6 fL (ref 78.0–100.0)
MCV: 90 fL (ref 78.0–100.0)
PLATELETS: 170 10*3/uL (ref 150–400)
PLATELETS: 165 10*3/uL (ref 150–400)
Platelets: 204 10*3/uL (ref 150–400)
RBC: 2.96 MIL/uL — AB (ref 4.22–5.81)
RBC: 3.34 MIL/uL — AB (ref 4.22–5.81)
RBC: 3.2 MIL/uL — AB (ref 4.22–5.81)
RDW: 15.1 % (ref 11.5–15.5)
RDW: 15.2 % (ref 11.5–15.5)
RDW: 15 % (ref 11.5–15.5)
WBC: 8.7 10*3/uL (ref 4.0–10.5)
WBC: 9 10*3/uL (ref 4.0–10.5)
WBC: 9.4 10*3/uL (ref 4.0–10.5)

## 2016-10-09 LAB — BASIC METABOLIC PANEL
Anion gap: 5 (ref 5–15)
Anion gap: 6 (ref 5–15)
BUN: 12 mg/dL (ref 6–20)
BUN: 12 mg/dL (ref 6–20)
CHLORIDE: 95 mmol/L — AB (ref 101–111)
CHLORIDE: 93 mmol/L — AB (ref 101–111)
CO2: 30 mmol/L (ref 22–32)
CO2: 31 mmol/L (ref 22–32)
Calcium: 8 mg/dL — ABNORMAL LOW (ref 8.9–10.3)
Calcium: 8.1 mg/dL — ABNORMAL LOW (ref 8.9–10.3)
Creatinine, Ser: 0.88 mg/dL (ref 0.61–1.24)
Creatinine, Ser: 0.74 mg/dL (ref 0.61–1.24)
GFR calc non Af Amer: >60 mL/min (ref >60)
Glucose, Bld: 121 mg/dL — ABNORMAL HIGH (ref 65–99)
Glucose, Bld: 129 mg/dL — ABNORMAL HIGH (ref 65–99)
POTASSIUM: 3.7 mmol/L (ref 3.5–5.1)
POTASSIUM: 3.5 mmol/L (ref 3.5–5.1)
SODIUM: 132 mmol/L — AB (ref 135–145)
SODIUM: 128 mmol/L — AB (ref 135–145)

## 2016-10-09 LAB — GLUCOSE, CAPILLARY
Glucose-Capillary: 109 mg/dL — ABNORMAL HIGH (ref 65–99)
Glucose-Capillary: 134 mg/dL — ABNORMAL HIGH (ref 65–99)
Glucose-Capillary: 128 mg/dL — ABNORMAL HIGH (ref 65–99)
Glucose-Capillary: 122 mg/dL — ABNORMAL HIGH (ref 65–99)
Glucose-Capillary: 110 mg/dL — ABNORMAL HIGH (ref 65–99)

## 2016-10-09 LAB — TYPE AND SCREEN
ABO/RH(D): A POS
Antibody Screen: NEGATIVE
Unit division: 0
Unit division: 0

## 2016-10-09 MED ORDER — FUROSEMIDE 40 MG PO TABS
40.0000 mg | ORAL_TABLET | Freq: Every day | ORAL | Status: DC
  Administered 2016-10-09 – 2016-10-12 (×4): 40 mg via ORAL
  Filled 2016-10-09 (×4): qty 1

## 2016-10-09 MED ORDER — INSULIN ASPART 100 UNIT/ML ~~LOC~~ SOLN
0.0000 [IU] | SUBCUTANEOUS | Status: DC
  Administered 2016-10-09 – 2016-10-10 (×4): 2 [IU] via SUBCUTANEOUS

## 2016-10-09 MED ORDER — POTASSIUM CHLORIDE CRYS ER 20 MEQ PO TBCR
20.0000 meq | EXTENDED_RELEASE_TABLET | Freq: Every day | ORAL | Status: DC
  Administered 2016-10-09 – 2016-10-12 (×4): 20 meq via ORAL
  Filled 2016-10-09 (×4): qty 1

## 2016-10-09 MED ORDER — SODIUM CHLORIDE 0.9% FLUSH
3.0000 mL | Freq: Two times a day (BID) | INTRAVENOUS | Status: DC
  Administered 2016-10-09 – 2016-10-12 (×7): 3 mL via INTRAVENOUS

## 2016-10-09 MED ORDER — SODIUM CHLORIDE 0.9 % IV SOLN: 250.0000 mL | INTRAVENOUS | Status: DC | PRN

## 2016-10-09 MED ORDER — ALBUTEROL SULFATE (2.5 MG/3ML) 0.083% IN NEBU
2.5000 mg | INHALATION_SOLUTION | Freq: Four times a day (QID) | RESPIRATORY_TRACT | Status: DC | PRN
  Administered 2016-10-09 – 2016-10-12 (×9): 2.5 mg via RESPIRATORY_TRACT
  Filled 2016-10-09 (×9): qty 3

## 2016-10-09 MED ORDER — POTASSIUM CHLORIDE CRYS ER 20 MEQ PO TBCR
40.0000 meq | EXTENDED_RELEASE_TABLET | Freq: Once | ORAL | Status: AC
  Administered 2016-10-09: 40 meq via ORAL
  Filled 2016-10-09: qty 2

## 2016-10-09 MED ORDER — MOVING RIGHT ALONG BOOK
Freq: Once | Status: AC
  Administered 2016-10-09: 1
  Filled 2016-10-09: qty 1

## 2016-10-09 MED ORDER — GUAIFENESIN ER 600 MG PO TB12
1200.0000 mg | ORAL_TABLET | Freq: Two times a day (BID) | ORAL | Status: DC
  Administered 2016-10-09 – 2016-10-12 (×7): 1200 mg via ORAL
  Filled 2016-10-09 (×7): qty 2

## 2016-10-09 MED ORDER — SODIUM CHLORIDE 0.9% FLUSH: 3.0000 mL | INTRAVENOUS | Status: DC | PRN

## 2016-10-09 NOTE — Care Management Important Message (Signed)
Important Message  Patient Details  Name: Devin CostainFrank L Colburn MRN: [id number] Date of Birth: April 30, 1945   Medicare Important Message Given:  Yes    Kyla BalzarineShealy, Elpidio Thielen Abena 10/09/2016, 10:40 AM

## 2016-10-09 NOTE — Progress Notes (Signed)
Pt transferred to 4E07 by this RN with all pt belongings, relayed request to receiving nurse that pts wife be called in the morning with updated room information.

## 2016-10-09 NOTE — Progress Notes (Signed)
CARDIAC REHAB PHASE I   PRE:  Rate/Rhythm: 106 ST  BP:  Supine:   Sitting: 131/64  Standing:    SaO2: 91% 1L   MODE:  Ambulation: 260 ft   POST:  Rate/Rhythm: 115  BP:  Supine:   Sitting: 133/73  Standing:    SaO2: 91-95% 2L 1030-1056 Pt looks dyspneic sitting in chair. Mouth breather. Pt walked 260 ft on 2L with rolling walker and asst x 1. Encouraged pursed lip breathing  but pt had difficulty with this as he breathes from mouth. Encouraged IS and he was only able to get to 250-350 ml. Put back to 1L after walk. To recliner with call bell. Encourage IS 10x an hour. Heart rhythm irregular at times.   Luetta Nuttingharlene Spencer Peterkin, RN BSN  10/09/2016 11:24 AM

## 2016-10-09 NOTE — Progress Notes (Signed)
Nutrition Follow-up  DOCUMENTATION CODES:   Severe malnutrition in context of chronic illness  INTERVENTION:    Ensure Enlive po TID, each supplement provides 350 kcal and 20 grams of protein  NUTRITION DIAGNOSIS:   Malnutrition (severe) related to chronic illness (COPD, CAD) as evidenced by severe depletion of muscle mass, severe depletion of body fat, percent weight loss.  Ongoing  GOAL:   Patient will meet greater than or equal to 90% of their needs  Progressing  MONITOR:   PO intake, Supplement acceptance, Skin, Labs, I & O's  ASSESSMENT:   71 yo male with PMH of asthma, heavy tobacco use, and COPD who was admitted on 7/12 with chest pain and SOB. 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 S/P TEE & CABG 7/13.  Patient reports ongoing poor appetite for the past year. PO intake has been variable, consuming 10-70% of meals. He has lost 6% of his usual weight in the past month. Weight has increased since admission likely related to fluid shifts, currently with edema in RLE.  Nutrition-Focused physical exam completed. Findings are severe fat depletion, severe muscle depletion, and moderate edema.  Labs reviewed.  CBG's: [phone] Medications reviewed and include Lasix, KCl, Flomax.  Diet Order:  Diet heart healthy/carb modified Room service appropriate? Yes; Fluid consistency: Thin  Skin:   (incisions)  Last BM:  7/11  Height:   Ht Readings from Last 1 Encounters:  10/05/16 5\' 8"  (1.727 m)    Weight:   Wt Readings from Last 1 Encounters:  10/09/16 143 lb 11.8 oz (65.2 kg)    Ideal Body Weight:  70 kg  BMI:  Body mass index is 21.86 kg/m.  Estimated Nutritional Needs:   Kcal:  1800-2000  Protein:  85-95 gm  Fluid:  1.8 L  EDUCATION NEEDS:   No education needs identified at this time  Joaquin CourtsKimberly Harris, RD, LDN, CNSC Pager [phone] After Hours Pager [phone]

## 2016-10-09 NOTE — Progress Notes (Addendum)
[address]       [place] [address]             [phone]      4 Days Post-Op Procedure(s) (LRB): CORONARY ARTERY BYPASS GRAFTING (CABG) x4 with FREE MAMMARY.  ENDOSCOPIC HARVESTING OF RIGHT SAPHENOUS VEIN. (FREE LIMA to LAD, SVG to OM, SVG SEQUENTIALLY to ACUTE MARGINAL and PDA) (N/A) TRANSESOPHAGEAL ECHOCARDIOGRAM (TEE) (N/A) Subjective: He has no pain but still has chest congestion.   Objective: Vital signs in last 24 hours: Temp:  [97.7 F (36.5 C)-99.2 F (37.3 C)] 99.2 F (37.3 C) (07/17 0436) Pulse Rate:  [61-107] 98 (07/16 2344) Cardiac Rhythm: Bundle branch block;Heart block (07/17 0700) Resp:  [15-24] 21 (07/17 0512) BP: (83-146)/(51-80) 117/73 (07/17 0436) SpO2:  [93 %-100 %] 95 % (07/17 0512) Weight:  [65.2 kg (143 lb 11.8 oz)-65.6 kg (144 lb 10 oz)] 65.2 kg (143 lb 11.8 oz) (07/17 0436)     Intake/Output from previous day: 07/16 0701 - 07/17 0700 In: 910 [P.O.:120; I.V.:91.7; Blood:698.3] Out: 3010 [Urine:3000; Chest Tube:10] Intake/Output this shift: No intake/output data recorded.  General appearance: alert, cooperative and no distress Heart: regular rate and rhythm, S1, S2 normal, no murmur, click, rub or gallop Lungs: clear to auscultation bilaterally Abdomen: soft, non-tender; bowel sounds normal; no masses,  no organomegaly Extremities: extremities normal, atraumatic, no cyanosis or edema Wound: staples in place, no signs of infection  Lab Results:  Recent Labs  10/09/16 0051 10/09/16 0325  WBC 9.4 8.7  HGB 9.5* 8.7*  HCT 28.8* 26.4*  PLT 165 170   BMET:  Recent Labs  10/08/16 0954 10/09/16 0051  NA 128* 128*  K 4.2 3.5  CL 98* 93*  CO2 27 30  GLUCOSE 132* 121*  BUN 12 12  CREATININE 0.69 0.88  CALCIUM 7.7* 8.0*    PT/INR: No results for input(s): LABPROT, INR in the last 72 hours. ABG    Component Value Date/Time   PHART 7.293 (L) 10/05/2016 1930   HCO3 22.9 10/05/2016 1930   TCO2 23 10/06/2016  1619   ACIDBASEDEF 3.0 (H) 10/05/2016 1930   O2SAT 94.0 10/05/2016 1930   CBG (last 3)   Recent Labs  10/08/16 1136 10/08/16 1939 10/09/16 0533  GLUCAP 107* 123* 109*    Assessment/Plan: S/P Procedure(s) (LRB): CORONARY ARTERY BYPASS GRAFTING (CABG) x4 with FREE MAMMARY.  ENDOSCOPIC HARVESTING OF RIGHT SAPHENOUS VEIN. (FREE LIMA to LAD, SVG to OM, SVG SEQUENTIALLY to ACUTE MARGINAL and PDA) (N/A) TRANSESOPHAGEAL ECHOCARDIOGRAM (TEE) (N/A)  1. CV-rate in the 80s, first degree heart block. BP stable. On Amio, Lopressor, and Lipitor. 2. Pulm-Remains on 2L [place] with good oxygen saturations. CXR from  This morning showed low lung volumes, inproved bibasilar infiltrates/edema with mild residual. Tiny bilateral pleural effusions again cannot be excluded. Continue incentive spirometry and pulm toilet.  3. Renal- creatinine okay. 80mg  of Lasix total yesterday. Will order 40mg  Lasix daily today. Replace potassium.  4. Endo-mostly controlled. On SSI 5. Lovenox for DVT proph 6. H and H okay, trending up.  7. Pain well controlled on oral medication   Plan: Add mucinex for congestion, encourage incentive spirometer use and ambulation. Start on oral Lasix, replace potassium. Has been on home dose of Flomax since 7/12 however retention after foley removed yesterday. Will attempt to remove later today.    LOS: 5 days    Sharlene Dory 10/09/2016  I have seen and examined the patient and agree with  the assessment and plan as outlined.  Slow progress.  Purcell Nailslarence H Roy Snuffer, MD 10/09/2016 3:24 PM

## 2016-10-09 NOTE — Progress Notes (Signed)
Patient ambulated 250 feet in the hall using a front wheel walker on 2L of oxygen, ambulation well tolerated will continue to monitor.

## 2016-10-10 LAB — GLUCOSE, CAPILLARY
GLUCOSE-CAPILLARY: 101 mg/dL — AB (ref 65–99)
GLUCOSE-CAPILLARY: 102 mg/dL — AB (ref 65–99)
GLUCOSE-CAPILLARY: 108 mg/dL — AB (ref 65–99)
GLUCOSE-CAPILLARY: 143 mg/dL — AB (ref 65–99)
Glucose-Capillary: 106 mg/dL — ABNORMAL HIGH (ref 65–99)
Glucose-Capillary: 106 mg/dL — ABNORMAL HIGH (ref 65–99)
Glucose-Capillary: 122 mg/dL — ABNORMAL HIGH (ref 65–99)

## 2016-10-10 LAB — BASIC METABOLIC PANEL
Anion gap: 10 (ref 5–15)
BUN: 11 mg/dL (ref 6–20)
CHLORIDE: 94 mmol/L — AB (ref 101–111)
CO2: 29 mmol/L (ref 22–32)
CREATININE: 0.74 mg/dL (ref 0.61–1.24)
Calcium: 8.4 mg/dL — ABNORMAL LOW (ref 8.9–10.3)
GFR calc Af Amer: >60 mL/min (ref >60)
GFR calc non Af Amer: >60 mL/min (ref >60)
GLUCOSE: 119 mg/dL — AB (ref 65–99)
POTASSIUM: 4.1 mmol/L (ref 3.5–5.1)
SODIUM: 133 mmol/L — AB (ref 135–145)

## 2016-10-10 NOTE — Progress Notes (Addendum)
      [address]       [place] [id number]             [phone]      5 Days Post-Op Procedure(s) (LRB): CORONARY ARTERY BYPASS GRAFTING (CABG) x4 with FREE MAMMARY.  ENDOSCOPIC HARVESTING OF RIGHT SAPHENOUS VEIN. (FREE LIMA to LAD, SVG to OM, SVG SEQUENTIALLY to ACUTE MARGINAL and PDA) (N/A) TRANSESOPHAGEAL ECHOCARDIOGRAM (TEE) (N/A) Subjective: Having some shortness of breath. Working on incentive spirometry.   Objective: Vital signs in last 24 hours: Temp:  [98.7 F (37.1 C)-99.3 F (37.4 C)] 99 F (37.2 C) (07/18 0829) Pulse Rate:  [79-100] 79 (07/18 0829) Cardiac Rhythm: Normal sinus rhythm;Bundle branch block (07/18 0700) Resp:  [19-22] 22 (07/18 0829) BP: (129-140)/(57-82) 130/57 (07/18 0829) SpO2:  [91 %-99 %] 91 % (07/18 0829) FiO2 (%):  [2 %] 2 % (07/18 0829) Weight:  [56.9 kg (125 lb 8 oz)] 56.9 kg (125 lb 8 oz) (07/18 0625)     Intake/Output from previous day: 07/17 0701 - 07/18 0700 In: 240 [P.O.:240] Out: 1575 [Urine:1575] Intake/Output this shift: No intake/output data recorded.  General appearance: alert, cooperative and no distress Heart: regular rate and rhythm, S1, S2 normal, no murmur, click, rub or gallop Lungs: rhonchi bilaterally. Abdomen: soft, non-tender; bowel sounds normal; no masses,  no organomegaly Extremities: extremities normal, atraumatic, no cyanosis or edema Wound: clean and dry, staples in place  Lab Results:  Recent Labs  10/09/16 0325 10/09/16 0804  WBC 8.7 9.0  HGB 8.7* 9.9*  HCT 26.4* 29.6*  PLT 170 204   BMET:  Recent Labs  10/09/16 0804 10/10/16 0234  NA 132* 133*  K 3.7 4.1  CL 95* 94*  CO2 31 29  GLUCOSE 129* 119*  BUN 12 11  CREATININE 0.74 0.74  CALCIUM 8.1* 8.4*    PT/INR: No results for input(s): LABPROT, INR in the last 72 hours. ABG    Component Value Date/Time   PHART 7.293 (L) 10/05/2016 1930   HCO3 22.9 10/05/2016 1930   TCO2 23 10/06/2016 1619   ACIDBASEDEF 3.0 (H)  10/05/2016 1930   O2SAT 94.0 10/05/2016 1930   CBG (last 3)   Recent Labs  10/09/16 2041 10/10/16 0009 10/10/16 0621  GLUCAP 110* 108* 106*    Assessment/Plan: S/P Procedure(s) (LRB): CORONARY ARTERY BYPASS GRAFTING (CABG) x4 with FREE MAMMARY.  ENDOSCOPIC HARVESTING OF RIGHT SAPHENOUS VEIN. (FREE LIMA to LAD, SVG to OM, SVG SEQUENTIALLY to ACUTE MARGINAL and PDA) (N/A) TRANSESOPHAGEAL ECHOCARDIOGRAM (TEE) (N/A)  1. CV-rate in the 70s, BBB. BP stable. On Amio, Lopressor, and Lipitor. 2. Pulm-Remains on 2L [place] with good oxygen saturations. Continue incentive spirometry and pulm toilet. Continue Mucinex and xopenex nebs.  3. Renal- creatinine okay. Continue 40mg  Lasix daily. Weight recorded appears to be accurate today. 4. Endo-mostly controlled. On SSI 5. Lovenox for DVT proph 6. H and H okay, trending up.   7. Pain well controlled on oral medication  Plan: Continue aggressive pulm toilet. Will trial foley catheter removal today. If failed will likely need to go home with foley. He does have a urologist outpatient. Will discontinue EPW.     LOS: 6 days    Sharlene Doryessa N Conte 10/10/2016  I have seen and examined the patient and agree with the assessment and plan as outlined.  Purcell Nailslarence H Genever Hentges, MD 10/10/2016 9:41 AM

## 2016-10-10 NOTE — Discharge Summary (Signed)
Physician Discharge Summary  Patient ID: Devin Barton MRN: [id number] DOB/AGE: 1946-01-01 71 y.o.  Admit date: 10/04/2016 Discharge date: 10/12/2016  Admission Diagnoses: Patient Active Problem List   Diagnosis Date Noted  . Coronary artery disease 10/05/2016  . Acute systolic heart failure (HCC)   . CIGARETTE SMOKER 08/15/2007  . HYPERTENSION, BENIGN 08/15/2007  . COPD 07/22/2007    Discharge Diagnoses:  Active Problems:   Acute systolic heart failure (HCC)   CAD (coronary artery disease)   Coronary artery disease   Discharged Condition: good  HPI:  Devin Barton is a 71 yo man with a past history of heavy tobacco abuse (60+ pack years, now 1 ppd) and COPD (FEV1= 1.29 in 2012). He also has unexplained weight loss dating back several years and prostate enlargement per his wife. He has no prior history of CAD.  Admitted to [facility] with CP on 6/1. Treated with bronchodilators for possible COPD exacerbation. Had an elevated troponin and BNP but no cardiac w/u done. Saw Dr. Antoine Poche on 6/20. Lexiscan myoview and echo ordered. Lexiscan was high risk with significant scar EF 30-44%. Echo showed severe LV dysfunction, mild MR, mild TR. Over the past week he has been awakened several times a night with chest pressure and shortness of breath. He also has symptoms with even minimal activity.  Hospital Course:  On 10/05/2016 Devin Barton underwent a coronary bypass grafting 3 by Dr. Dorris Fetch. He tolerated the procedure well and was transferred to the ICU. He was extubated timely manner. Postop day 1 he continued on dobutamine and Neosynephrine. We initiated aggressive pulmonary toilet with suctioning and bronchodilators. He did have severe preop COPD. We initiated diuretic regimen for fluid overload. We began to mobilize the patient. Postop day 2 he continues to progress. We discontinued his Foley catheter. We continued his mediastinal chest tube for another day due to increased output. We  continued to encourage pulmonary toilet. He had an episode of atrial fibrillation therefore IV amiodarone was initiated. He was weaned off of dopamine. Postop day 3 he converted to normal sinus rhythm. We transitioned his IV amiodarone by mouth amiodarone. We continued to wean his supplemental oxygen. He did have some small bilateral pleural effusions on chest x-ray which was expected. His renal function remained stable. We continued his Flomax for his benign prostate hyperplasia. We continue Lovenox for DVT prophylaxis. We gave 1 dose of IV Lasix today for fluid overload. Later that day, he did have some urinary retention where he retained 1800 mL of urine. We replaced his Foley catheter. He has had issues with urinary retention in the past and follows a urologist outpatient. Postop day 4 he remained in normal sinus rhythm. We continued to encourage incentive spirometry and pulmonary toilet. We initiated a daily diuretic regimen with potassium supplementation. We added Mucinex for congestion. He also was on Xopenex nebs as needed. Postop day 4 we discontinued his epicardial pacing wires. We trialed a Foley catheter removal. We continued to encourage ambulation several times a day as well as incentive spirometry use.  Consults: SLP, nutrition  Significant Diagnostic Studies:  CLINICAL DATA:  CABG.  EXAM: CHEST  2 VIEW  COMPARISON:  10/08/2016.  FINDINGS: Interim removal right IJ sheath. Prior median sternotomy and CABG. Cardiomegaly with normal pulmonary vascularity. Low lung volumes. Interim improvement of bibasilar infiltrates/edema with mild residual . Tiny bilateral pleural effusions again cannot be excluded. No pneumothorax.  IMPRESSION: 1. Interim removal of right IJ sheath. Prior CABG. Stable cardiomegaly.  2. Low  lung volumes. Interim improvement of bibasilar infiltrates/edema with mild residual. Tiny bilateral pleural effusions again cannot be excluded.   Electronically  Signed   By: Maisie Fus  Register   On: 10/09/2016 07:32  Treatments:  NAME:  Devin Barton, Devin Barton NO.:  [id number]  MEDICAL RECORD NO.:  [id number]  LOCATION:  [id number]                        FACILITY:  [facility]  PHYSICIAN:  Salvatore Decent. Dorris Fetch, M.D.DATE OF BIRTH:  1945-08-12  DATE OF PROCEDURE:  10/04/2016 DATE OF DISCHARGE:                              OPERATIVE REPORT   PREOPERATIVE DIAGNOSES:  Left main and 3-vessel coronary disease with unstable angina.  POSTOPERATIVE DIAGNOSIS:  Left main and 3-vessel coronary disease with unstable angina.  PROCEDURE:  Median sternotomy, extracorporeal circulation, coronary artery bypass grafting x4, (free left internal mammary artery to left anterior descending, saphenous vein graft to obtuse marginal 1, sequential saphenous vein graft to acute marginal and posterior descending).  SURGEON:  Salvatore Decent. Dorris Fetch, M.D.  ASSISTANT:  Doree Fudge, PA.  ANESTHESIA:  General.  Discharge Exam: Blood pressure 117/67, pulse 85, temperature 98.1 F (36.7 C), temperature source Oral, resp. rate 20, height 5\' 8"  (1.727 m), weight 62.3 kg (137 lb 4.8 oz), SpO2 (!) 86 %.    General appearance: alert, cooperative and no distress Heart: regular rate and rhythm, S1, S2 normal, no murmur, click, rub or gallop Lungs: bilateral rhonchi Abdomen: soft, non-tender; bowel sounds normal; no masses,  no organomegaly Extremities: extremities normal, atraumatic, no cyanosis or edema Wound: clean and dry  Disposition: 01-Home or Self Care  Discharge Instructions    Amb Referral to Cardiac Rehabilitation    Complete by:  As directed    Diagnosis:  CABG   CABG X ___:  4   Face-to-face encounter (required for Medicare/Medicaid patients)    Complete by:  As directed    I Sharlene Dory certify that this patient is under my care and that I, or a nurse practitioner or physician's assistant working with me, had a face-to-face  encounter that meets the physician face-to-face encounter requirements with this patient on 10/12/2016. The encounter with the patient was in whole, or in part for the following medical condition(s) which is the primary reason for home health care (List medical condition): He will need help with weaning oxygen. Please do oxygen saturation, HR, BP, weight ect. If abnormal values please call our office.   The encounter with the patient was in whole, or in part, for the following medical condition, which is the primary reason for home health care:  needs oxygen at discharge   I certify that, based on my findings, the following services are medically necessary home health services:  Nursing   Reason for Medically Necessary Home Health Services:  Skilled Nursing- Skilled Assessment/Observation   My clinical findings support the need for the above services:  Shortness of breath with activity   Further, I certify that my clinical findings support that this patient is homebound due to:  Shortness of Breath with activity   For home use only DME oxygen    Complete by:  As directed    Requires 3L with ambulation. While sitting down usually down to 1-2L  Mode or (Route):  Nasal cannula   Liters per Minute:  3   Oxygen delivery system:  Gas   Home Health    Complete by:  As directed    To provide the following care/treatments:  RN     Allergies as of 10/12/2016      Reactions   No Known Allergies       Medication List    STOP taking these medications   ibuprofen 200 MG tablet Commonly known as:  ADVIL,MOTRIN   metoprolol succinate 25 MG 24 hr tablet Commonly known as:  TOPROL XL     TAKE these medications   amiodarone 400 MG tablet Commonly known as:  PACERONE Take 1 tablet (400 mg total) by mouth every 12 (twelve) hours.   amiodarone 400 MG tablet Commonly known as:  PACERONE Take 1 tablet (400 mg total) by mouth daily. Start taking on:  10/15/2016   aspirin 325 MG EC tablet Take 1  tablet (325 mg total) by mouth daily. What changed:  medication strength  how much to take   atorvastatin 20 MG tablet Commonly known as:  LIPITOR Take 1 tablet (20 mg total) by mouth daily at 6 PM.   furosemide 40 MG tablet Commonly known as:  LASIX Take 1 tablet (40 mg total) by mouth daily.   ipratropium 0.02 % nebulizer solution Commonly known as:  ATROVENT Take 0.5 mg by nebulization daily as needed for wheezing or shortness of breath.   metoprolol tartrate 25 MG tablet Commonly known as:  LOPRESSOR Take 0.5 tablets (12.5 mg total) by mouth 2 (two) times daily.   omeprazole 40 MG capsule Commonly known as:  PRILOSEC Take 40 mg by mouth daily.   potassium chloride SA 20 MEQ tablet Commonly known as:  K-DUR,KLOR-CON Take 1 tablet (20 mEq total) by mouth daily.   PROAIR HFA 108 (90 Base) MCG/ACT inhaler Generic drug:  albuterol Inhale 2 puffs into the lungs every 6 (six) hours as needed for wheezing or shortness of breath.   tamsulosin 0.4 MG Caps capsule Commonly known as:  FLOMAX Take 0.4 mg by mouth every evening.   traMADol 50 MG tablet Commonly known as:  ULTRAM Take 1 tablet (50 mg total) by mouth every 6 (six) hours as needed for moderate pain.            Durable Medical Equipment        Start     Ordered   10/12/16 1441  For home use only DME oxygen  Once    Question Answer Comment  Mode or (Route) Nasal cannula   Liters per Minute 3   Frequency Continuous (stationary and portable oxygen unit needed)   Oxygen conserving device Yes   Oxygen delivery system Gas      10/12/16 1441   10/12/16 0000  For home use only DME oxygen    Comments:  Requires 3L with ambulation. While sitting down usually down to 1-2L  Question Answer Comment  Mode or (Route) Nasal cannula   Liters per Minute 3   Oxygen delivery system Gas      10/12/16 1430     Follow-up Information    Loreli SlotHendrickson, Steven C, MD Follow up.   Specialty:  Cardiothoracic  Surgery Why:  Your appointment is on 11/13/2016 at 2:30pm. Please arrive at 2:00pm for [facility] which is on the first floor of our building.  Contact information: [address] [place]  [address] [phone]        Assunta Found, MD. Call in 1 day(s).   Specialty:  Family Medicine Contact information: [address] [place] [address] [phone]        Antoine Poche, MD Follow up.   Specialty:  Cardiology Why:  Dr. Wyline Mood 8/2 @2pm  [facility]) Contact information: [address] [place] [address] [phone]        nursing Follow up.   Why:  Please arrive at 10:00am on 7/27 for a chest tube suture removal and a sternotomy incision staple removal apointment at our office.  Contact information: Dr. Sunday Corn office.        [facility] - Dme Follow up.   Why:  home 02 arranged- portable tank to be delivered to room prior to discharge Contact information: [address] [place] [address] [phone]        [facility], [facility]-Home Follow up.   Why:  HHRN arranged- they will call to set up home visits- please allow 24-48 hr post discharge Contact information: [address] [place] [address] [phone]           Signed: Sharlene Dory 10/12/2016, 2:42 PM

## 2016-10-10 NOTE — Progress Notes (Addendum)
Patient SP02 sats in the 80's despite repositioning. Gave patient a breathing treatment. Sats now in the upper 90's. Advised patient to stop taking his oxygen off-has been put back on several times by myself and the NT. Also encouraged patient to cough up any phlegm. Will continue to monitor and sit at the station outside of the room. Call light within reach.

## 2016-10-10 NOTE — Progress Notes (Signed)
CARDIAC REHAB PHASE I   PRE:  Rate/Rhythm: 80 SR  BP:  Supine:   Sitting: 109/60  Standing:    SaO2: 92%RA  MODE:  Ambulation: 310 ft   POST:  Rate/Rhythm: 92 SR  BP:  Supine:   Sitting: 118/69  Standing:    SaO2: 92% 2L 1036-1100 Oxygen not connected when I entered room but sats above 90%. Put on 2L to walk. Pt walked 310 ft with gait belt use and rolling walker. Stopped several times due to SOB.  To recliner after walk. Put on 1L and notified RN that he had been on RA earlier and sats were good. Does need oxygen still when walking as he is very DOE .Encouraged IS.   Luetta Nuttingharlene Aleesa Sweigert, RN BSN  10/10/2016 11:25 AM

## 2016-10-11 LAB — GLUCOSE, CAPILLARY: GLUCOSE-CAPILLARY: 96 mg/dL (ref 65–99)

## 2016-10-11 MED ORDER — POLYETHYLENE GLYCOL 3350 17 G PO PACK
17.0000 g | PACK | Freq: Every day | ORAL | Status: DC
  Administered 2016-10-11 – 2016-10-12 (×2): 17 g via ORAL
  Filled 2016-10-11 (×2): qty 1

## 2016-10-11 NOTE — Progress Notes (Signed)
CARDIAC REHAB PHASE I   PRE:  Rate/Rhythm: 76 SR  BP:  Supine:   Sitting: 129/78  Standing:    SaO2: 92%RA  MODE:  Ambulation: 390 on RA   And then 250 ft to qualify for  oxygen ft   POST:  Rate/Rhythm: 90-92 SR  BP:  Supine:   Sitting: 124/72  Standing:    SaO2: 80s on RA   92 on 2L 1055-1140 Tried pt without rolling walker and he needed it for stability. Would recommend rolling walker for home use. Pt was doing well on RA sitting so we walked 390 ft on RA. Pt felt SOB and his sats dropped to low 80s. Back to room to rest and then we walked 250 ft and he felt better on 2L with sats at 92. Notified case manager that pt needs oxygen at this time walking.   Luetta Nuttingharlene Muhamed Luecke, RN BSN  10/11/2016 11:34 AM

## 2016-10-11 NOTE — Progress Notes (Addendum)
[address]       [place] [id number]             [phone]      6 Days Post-Op Procedure(s) (LRB): CORONARY ARTERY BYPASS GRAFTING (CABG) x4 with FREE MAMMARY.  ENDOSCOPIC HARVESTING OF RIGHT SAPHENOUS VEIN. (FREE LIMA to LAD, SVG to OM, SVG SEQUENTIALLY to ACUTE MARGINAL and PDA) (N/A) TRANSESOPHAGEAL ECHOCARDIOGRAM (TEE) (N/A) Subjective: Shares that he is constipated. Eating better and now able to urinate after the foley was taken out yesterday  Objective: Vital signs in last 24 hours: Temp:  [97.9 F (36.6 C)-99 F (37.2 C)] 98.4 F (36.9 C) (07/19 0402) Pulse Rate:  [69-135] 90 (07/19 0527) Cardiac Rhythm: Normal sinus rhythm;Bundle branch block (07/19 0700) Resp:  [18-22] 18 (07/19 0527) BP: (118-143)/(57-76) 143/73 (07/19 0402) SpO2:  [91 %-100 %] 98 % (07/19 0527) FiO2 (%):  [2 %] 2 % (07/18 0829) Weight:  [62.3 kg (137 lb 6.4 oz)] 62.3 kg (137 lb 6.4 oz) (07/19 0402)     Intake/Output from previous day: 07/18 0701 - 07/19 0700 In: 1290 [P.O.:1290] Out: 1100 [Urine:1100] Intake/Output this shift: No intake/output data recorded.  General appearance: alert, cooperative and no distress Heart: regular rate and rhythm, S1, S2 normal, no murmur, click, rub or gallop Lungs: clear to auscultation bilaterally Abdomen: soft, non-tender; bowel sounds normal; no masses,  no organomegaly Extremities: extremities normal, atraumatic, no cyanosis or edema Wound: clean and dry, staples in place.  Lab Results:  Recent Labs  10/09/16 0325 10/09/16 0804  WBC 8.7 9.0  HGB 8.7* 9.9*  HCT 26.4* 29.6*  PLT 170 204   BMET:  Recent Labs  10/09/16 0804 10/10/16 0234  NA 132* 133*  K 3.7 4.1  CL 95* 94*  CO2 31 29  GLUCOSE 129* 119*  BUN 12 11  CREATININE 0.74 0.74  CALCIUM 8.1* 8.4*    PT/INR: No results for input(s): LABPROT, INR in the last 72 hours. ABG    Component Value Date/Time   PHART 7.293 (L) 10/05/2016 1930   HCO3 22.9  10/05/2016 1930   TCO2 23 10/06/2016 1619   ACIDBASEDEF 3.0 (H) 10/05/2016 1930   O2SAT 94.0 10/05/2016 1930   CBG (last 3)   Recent Labs  10/10/16 2040 10/10/16 2337 10/11/16 0400  GLUCAP 122* 106* 96    Assessment/Plan: S/P Procedure(s) (LRB): CORONARY ARTERY BYPASS GRAFTING (CABG) x4 with FREE MAMMARY.  ENDOSCOPIC HARVESTING OF RIGHT SAPHENOUS VEIN. (FREE LIMA to LAD, SVG to OM, SVG SEQUENTIALLY to ACUTE MARGINAL and PDA) (N/A) TRANSESOPHAGEAL ECHOCARDIOGRAM (TEE) (N/A)  1. CV-rate in the 70s, BBB. BP stable. On Amio, Lopressor, and Lipitor. 2. Pulm-Remains on 2L [place] with good oxygen saturations. Continue incentive spirometry and pulm toilet. Continue Mucinex and xopenex nebs.  3. Renal- creatinine okay. Continue 40mg  Lasix daily. Weights inconsistent.  4. Endo-mostly controlled. A1C is 5.3. Discontinue CBGs.  5. Lovenox for DVT proph 6. H and H okay, trending up.   7. Pain well controlled on oral medication 8. Constipation- Will add Miralax today  Plan: Continue aggressive pulm toilet. Wean oxygen as tolerated.  Ambulate TID. Work on bowel movement. Possibly home tomorrow.    LOS: 7 days    Sharlene Doryessa N Conte 10/11/2016  Slowly improving  Poss home 1-2 days  I have seen and examined Devin Barton and agree with the above assessment  and plan.  Delight OvensEdward B Yahayra Geis MD [place] [phone] Office [phone] 10/11/2016 9:05 AM

## 2016-10-11 NOTE — Care Management Note (Addendum)
Case Management Note Previous CM note initiated by Leone Havenaylor, Deborah Clinton, RN--10/05/2016, 4:47 PM   Patient Details  Name: Devin Barton MRN: [id number] Date of Birth: [birth year]  Subjective/Objective:   From home with wife, pta indep, post op CABG , conts on vent. Per wife he has had HH services in the past but can not remember who that was with it was so long ago.  He has a PCP and he has medication coverage.    7/16 1025 Letha Capeeborah Taylor RN, BSN -  POD 3 CABG, cont lasix for fluid overload, d/c remaining chest tubes, will transition iv amio to po amio.                   Action/Plan: NCM will follow for dc needs.   Expected Discharge Date:                  Expected Discharge Plan:  Home w Home Health Services  In-House Referral:  NA  Discharge planning Services  CM Consult  Post Acute Care Choice:  NA Choice offered to:  NA  DME Arranged:    DME Agency:     HH Arranged:    HH Agency:     Status of Service:  In process, will continue to follow  If discussed at Long Length of Stay Meetings, dates discussed:  7/19  Discharge Disposition:   Additional Comments:  10/11/16- 1045- Devin Swim RN, CM- weaning 02, EPW removed- plan to return home- CM to continue to follow-- per Cardiac rehab may need home 02 and pt would like RW for home-   Darrold SpanWebster, Devin Speas Hall, RN 10/11/2016, 10:44 AM [phone]

## 2016-10-11 NOTE — Progress Notes (Signed)
SATURATION QUALIFICATIONS: (This note is used to comply with regulatory documentation for home oxygen)  Patient Saturations on Room Air at Rest = 95%  Patient Saturations on Room Air while Ambulating = 82%  Patient Saturations on 2  Liters of oxygen while Ambulating = 92%  Please briefly explain why patient needs home oxygen: felt SOB and desat on RA walking

## 2016-10-12 MED ORDER — AMIODARONE HCL 400 MG PO TABS: 400.0000 mg | ORAL_TABLET | Freq: Two times a day (BID) | ORAL | 0 refills | Status: DC

## 2016-10-12 MED ORDER — TRAMADOL HCL 50 MG PO TABS: 50.0000 mg | ORAL_TABLET | Freq: Four times a day (QID) | ORAL | 0 refills | Status: DC | PRN

## 2016-10-12 MED ORDER — AMIODARONE HCL 400 MG PO TABS: 400.0000 mg | ORAL_TABLET | Freq: Every day | ORAL | 1 refills | Status: DC

## 2016-10-12 MED ORDER — FUROSEMIDE 40 MG PO TABS: 40.0000 mg | ORAL_TABLET | Freq: Every day | ORAL | 0 refills | Status: DC

## 2016-10-12 MED ORDER — POTASSIUM CHLORIDE CRYS ER 20 MEQ PO TBCR: 20.0000 meq | EXTENDED_RELEASE_TABLET | Freq: Every day | ORAL | 0 refills | Status: DC

## 2016-10-12 MED ORDER — IPRATROPIUM-ALBUTEROL 0.5-2.5 (3) MG/3ML IN SOLN
3.0000 mL | RESPIRATORY_TRACT | Status: DC
  Administered 2016-10-12 (×2): 3 mL via RESPIRATORY_TRACT
  Filled 2016-10-12 (×2): qty 3

## 2016-10-12 MED ORDER — ASPIRIN 325 MG PO TBEC: 325.0000 mg | DELAYED_RELEASE_TABLET | Freq: Every day | ORAL | 0 refills | Status: DC

## 2016-10-12 MED ORDER — METOPROLOL TARTRATE 25 MG PO TABS
12.5000 mg | ORAL_TABLET | Freq: Two times a day (BID) | ORAL | 1 refills | Status: DC
Start: 2016-10-12 — End: 2016-10-30

## 2016-10-12 MED ORDER — GLYCERIN (LAXATIVE) 2.1 G RE SUPP
1.0000 | Freq: Once | RECTAL | Status: AC
  Administered 2016-10-12: 1 via RECTAL
  Filled 2016-10-12 (×3): qty 1

## 2016-10-12 MED ORDER — ATORVASTATIN CALCIUM 20 MG PO TABS: 20.0000 mg | ORAL_TABLET | Freq: Every day | ORAL | 1 refills | Status: DC

## 2016-10-12 NOTE — Progress Notes (Addendum)
      [address]       [place] [id number]             [phone]      7 Days Post-Op Procedure(s) (LRB): CORONARY ARTERY BYPASS GRAFTING (CABG) x4 with FREE MAMMARY.  ENDOSCOPIC HARVESTING OF RIGHT SAPHENOUS VEIN. (FREE LIMA to LAD, SVG to OM, SVG SEQUENTIALLY to ACUTE MARGINAL and PDA) (N/A) TRANSESOPHAGEAL ECHOCARDIOGRAM (TEE) (N/A) Subjective: Feels good this morning. Is coughing while eating but states this happens sometimes.   Objective: Vital signs in last 24 hours: Temp:  [97.8 F (36.6 C)-99.4 F (37.4 C)] 98.1 F (36.7 C) (07/20 0402) Pulse Rate:  [76-82] 76 (07/20 0402) Cardiac Rhythm: Normal sinus rhythm;Bundle branch block (07/20 0703) Resp:  [18-22] 20 (07/20 0402) BP: (124-140)/(69-79) 126/69 (07/20 0402) SpO2:  [89 %-100 %] 98 % (07/20 0402) Weight:  [62.3 kg (137 lb 4.8 oz)] 62.3 kg (137 lb 4.8 oz) (07/20 0402)     Intake/Output from previous day: 07/19 0701 - 07/20 0700 In: 840 [P.O.:840] Out: 150 [Urine:150] Intake/Output this shift: No intake/output data recorded.  General appearance: alert, cooperative and no distress Heart: regular rate and rhythm, S1, S2 normal, no murmur, click, rub or gallop Lungs: bilateral rhonchi Abdomen: soft, non-tender; bowel sounds normal; no masses,  no organomegaly Extremities: extremities normal, atraumatic, no cyanosis or edema Wound: clean and dry  Lab Results:  Recent Labs  10/09/16 0804  WBC 9.0  HGB 9.9*  HCT 29.6*  PLT 204   BMET:  Recent Labs  10/09/16 0804 10/10/16 0234  NA 132* 133*  K 3.7 4.1  CL 95* 94*  CO2 31 29  GLUCOSE 129* 119*  BUN 12 11  CREATININE 0.74 0.74  CALCIUM 8.1* 8.4*    PT/INR: No results for input(s): LABPROT, INR in the last 72 hours. ABG    Component Value Date/Time   PHART 7.293 (L) 10/05/2016 1930   HCO3 22.9 10/05/2016 1930   TCO2 23 10/06/2016 1619   ACIDBASEDEF 3.0 (H) 10/05/2016 1930   O2SAT 94.0 10/05/2016 1930   CBG (last 3)    Recent Labs  10/10/16 2040 10/10/16 2337 10/11/16 0400  GLUCAP 122* 106* 96    Assessment/Plan: S/P Procedure(s) (LRB): CORONARY ARTERY BYPASS GRAFTING (CABG) x4 with FREE MAMMARY.  ENDOSCOPIC HARVESTING OF RIGHT SAPHENOUS VEIN. (FREE LIMA to LAD, SVG to OM, SVG SEQUENTIALLY to ACUTE MARGINAL and PDA) (N/A) TRANSESOPHAGEAL ECHOCARDIOGRAM (TEE) (N/A)  1. CV-NSR in the 80s with BBB. BP stable. 2. Pulm-tolerating 2L [place]. Continue incentive spirometry and pulm toilet. Continue Mucinex and Xopenex nebs 3. Renal-Continue 40mg  Lasix daily 4. Endo-A1C is 5.3, will need followed outpatient 5. Lovenox for DVT proph 6. H and H stable 7. Pain well controlled on oral medication 8. Constipation-On multiple agents will add suppository.  Plan: 6 minute walk test to evaluate for home oxygen. Work on bowel movement. Possibly home later today.      LOS: 8 days    Devin Barton 10/12/2016  I have seen and examined the patient and agree with the assessment and plan as outlined.  Continues to improve.  Looks okay for d/c home w/ home health and O2  Purcell Nailslarence H Meryl Ponder, MD 10/12/2016 9:19 AM

## 2016-10-12 NOTE — Progress Notes (Signed)
SATURATION QUALIFICATIONS: (This note is used to comply with regulatory documentation for home oxygen)  Patient Saturations on Room Air at Rest =95%  Patient Saturations on Room Air while Ambulating = 85%  Patient Saturations on 3 Liters of oxygen while Ambulating = 94%  Please briefly explain why patient needs home oxygen: took 3L to keep sats up walking

## 2016-10-12 NOTE — Care Management Note (Signed)
Case Management Note Previous CM note initiated by Leone Havenaylor, Deborah Clinton, RN--10/05/2016, 4:47 PM   Patient Details  Name: Devin CostainFrank L Antunes MRN: [id number] Date of Birth: 05-22-1945  Subjective/Objective:   From home with wife, pta indep, post op CABG , conts on vent. Per wife he has had HH services in the past but can not remember who that was with it was so long ago.  He has a PCP and he has medication coverage.    7/16 1025 Letha Capeeborah Taylor RN, BSN -  POD 3 CABG, cont lasix for fluid overload, d/c remaining chest tubes, will transition iv amio to po amio.                   Action/Plan: NCM will follow for dc needs.   Expected Discharge Date:                  Expected Discharge Plan:  Home w Home Health Services  In-House Referral:  NA  Discharge planning Services  CM Consult  Post Acute Care Choice:  Durable Medical Equipment, Home Health Choice offered to:  Spouse, Patient  DME Arranged:  Oxygen DME Agency:  [facility]  HH [facility]  [facility]  Status of Service:  Completed, signed off  If discussed at Long Length of Stay Meetings, dates discussed:  7/19  Discharge Disposition: home/home health   Additional Comments:  10/12/16- 1440- Maimuna Leaman RN, CM- pt for d/c home today- pt will need home 02 and has qualifying note- order has been placed for home 02- and HHRN- spoke with pt and wife at bedside [facility] [place]- they will use [facility] per choice- for both DME and HH needs- [facility] and HHRN needs- portable home 02 tank to be delivered to room prior to discharge. Pt has decided he does not need a RW for home.   10/11/16- 1045- Jaylynne Birkhead RN, CM- weaning 02, EPW removed- plan to return home- CM to continue to follow-- per Cardiac rehab may need home 02 and pt would like RW for home-   Darrold SpanWebster, Zacharey Jensen Hall, RN 10/12/2016, 2:41 PM [phone]

## 2016-10-12 NOTE — Care Management Important Message (Signed)
Important Message  Patient Details  Name: Devin CostainFrank L Fabio MRN: [id number] Date of Birth: 06-Jan-1946   Medicare Important Message Given:  Yes    Kyla BalzarineShealy, Hailley Byers Abena 10/12/2016, 10:31 AM

## 2016-10-12 NOTE — Progress Notes (Signed)
CARDIAC REHAB PHASE I   PRE:  Rate/Rhythm: 85 SR  BP:  Supine:   Sitting: 117/67  Standing:    SaO2: 97% 2L  95%RA  MODE:  Ambulation: 250 ft   POST:  Rate/Rhythm: 102 ST BP:  Supine:   Sitting: 121/66  Standing:    SaO2: 85%RA  89% 2L  94%3L 0950-1043 Pt walked 250 ft and needed 3L to keep sats up. Walked with asst x 1. Pt wanted rolling walker yesterday but not sure if he wants one today. Education completed with pt and wife. Encouraged smoking cessation and gave fake cigarette and smoking cessation handout. Discussed CRP 2 and referring to [place]. Encouraged IS and flutter valve. Encouraged him to weigh daily and watch sodium to 2000mg . Gave heart healthy diet also. Did not want to watch discharge video.   Luetta Nuttingharlene Zarea Diesing, RN BSN  10/12/2016 10:40 AM

## 2016-10-15 ENCOUNTER — Encounter (HOSPITAL_COMMUNITY): Payer: Self-pay | Admitting: Nurse Practitioner

## 2016-10-15 ENCOUNTER — Emergency Department (HOSPITAL_COMMUNITY): Payer: Medicare HMO

## 2016-10-15 ENCOUNTER — Inpatient Hospital Stay (HOSPITAL_COMMUNITY): Payer: Medicare HMO

## 2016-10-15 ENCOUNTER — Inpatient Hospital Stay (HOSPITAL_COMMUNITY)
Admission: EM | Admit: 2016-10-15 | Discharge: 2016-10-20 | DRG: 291 | Disposition: A | Discharge status: Home Health | Payer: Medicare HMO | Attending: Internal Medicine | Admitting: Internal Medicine

## 2016-10-15 DIAGNOSIS — Z951 Presence of aortocoronary bypass graft: Secondary | ICD-10-CM

## 2016-10-15 DIAGNOSIS — I480 Paroxysmal atrial fibrillation: Secondary | ICD-10-CM | POA: Diagnosis not present

## 2016-10-15 DIAGNOSIS — R079 Chest pain, unspecified: Secondary | ICD-10-CM | POA: Diagnosis not present

## 2016-10-15 DIAGNOSIS — I509 Heart failure, unspecified: Secondary | ICD-10-CM

## 2016-10-15 DIAGNOSIS — R0902 Hypoxemia: Secondary | ICD-10-CM

## 2016-10-15 DIAGNOSIS — I5043 Acute on chronic combined systolic (congestive) and diastolic (congestive) heart failure: Secondary | ICD-10-CM | POA: Diagnosis not present

## 2016-10-15 DIAGNOSIS — R627 Adult failure to thrive: Secondary | ICD-10-CM | POA: Diagnosis present

## 2016-10-15 DIAGNOSIS — I2510 Atherosclerotic heart disease of native coronary artery without angina pectoris: Secondary | ICD-10-CM | POA: Diagnosis not present

## 2016-10-15 DIAGNOSIS — I110 Hypertensive heart disease with heart failure: Secondary | ICD-10-CM | POA: Diagnosis not present

## 2016-10-15 DIAGNOSIS — I10 Essential (primary) hypertension: Secondary | ICD-10-CM | POA: Diagnosis not present

## 2016-10-15 DIAGNOSIS — K219 Gastro-esophageal reflux disease without esophagitis: Secondary | ICD-10-CM | POA: Diagnosis present

## 2016-10-15 DIAGNOSIS — J441 Chronic obstructive pulmonary disease with (acute) exacerbation: Secondary | ICD-10-CM | POA: Diagnosis present

## 2016-10-15 DIAGNOSIS — Z681 Body mass index (BMI) 19 or less, adult: Secondary | ICD-10-CM | POA: Diagnosis not present

## 2016-10-15 DIAGNOSIS — E43 Unspecified severe protein-calorie malnutrition: Secondary | ICD-10-CM | POA: Diagnosis present

## 2016-10-15 DIAGNOSIS — I5021 Acute systolic (congestive) heart failure: Secondary | ICD-10-CM | POA: Diagnosis not present

## 2016-10-15 DIAGNOSIS — R609 Edema, unspecified: Secondary | ICD-10-CM | POA: Diagnosis not present

## 2016-10-15 DIAGNOSIS — G4700 Insomnia, unspecified: Secondary | ICD-10-CM | POA: Diagnosis present

## 2016-10-15 DIAGNOSIS — I25810 Atherosclerosis of coronary artery bypass graft(s) without angina pectoris: Secondary | ICD-10-CM | POA: Diagnosis not present

## 2016-10-15 DIAGNOSIS — I252 Old myocardial infarction: Secondary | ICD-10-CM

## 2016-10-15 DIAGNOSIS — J9600 Acute respiratory failure, unspecified whether with hypoxia or hypercapnia: Secondary | ICD-10-CM | POA: Diagnosis present

## 2016-10-15 DIAGNOSIS — R600 Localized edema: Secondary | ICD-10-CM | POA: Diagnosis not present

## 2016-10-15 DIAGNOSIS — J9601 Acute respiratory failure with hypoxia: Secondary | ICD-10-CM | POA: Diagnosis not present

## 2016-10-15 DIAGNOSIS — R69 Illness, unspecified: Secondary | ICD-10-CM | POA: Diagnosis not present

## 2016-10-15 DIAGNOSIS — J449 Chronic obstructive pulmonary disease, unspecified: Secondary | ICD-10-CM | POA: Diagnosis not present

## 2016-10-15 DIAGNOSIS — R1310 Dysphagia, unspecified: Secondary | ICD-10-CM

## 2016-10-15 DIAGNOSIS — F17210 Nicotine dependence, cigarettes, uncomplicated: Secondary | ICD-10-CM | POA: Diagnosis present

## 2016-10-15 DIAGNOSIS — N400 Enlarged prostate without lower urinary tract symptoms: Secondary | ICD-10-CM | POA: Diagnosis present

## 2016-10-15 DIAGNOSIS — R0602 Shortness of breath: Secondary | ICD-10-CM | POA: Diagnosis not present

## 2016-10-15 DIAGNOSIS — Z8249 Family history of ischemic heart disease and other diseases of the circulatory system: Secondary | ICD-10-CM

## 2016-10-15 DIAGNOSIS — R05 Cough: Secondary | ICD-10-CM | POA: Diagnosis not present

## 2016-10-15 DIAGNOSIS — R4702 Dysphasia: Secondary | ICD-10-CM | POA: Diagnosis not present

## 2016-10-15 DIAGNOSIS — E441 Mild protein-calorie malnutrition: Secondary | ICD-10-CM | POA: Diagnosis not present

## 2016-10-15 DIAGNOSIS — J90 Pleural effusion, not elsewhere classified: Secondary | ICD-10-CM | POA: Diagnosis not present

## 2016-10-15 LAB — CBC
HCT: 32.8 % — ABNORMAL LOW (ref 39.0–52.0)
HEMOGLOBIN: 10.8 g/dL — AB (ref 13.0–17.0)
MCH: 29.8 pg (ref 26.0–34.0)
MCHC: 32.9 g/dL (ref 30.0–36.0)
MCV: 90.6 fL (ref 78.0–100.0)
PLATELETS: 705 10*3/uL — AB (ref 150–400)
RBC: 3.62 MIL/uL — AB (ref 4.22–5.81)
RDW: 14.9 % (ref 11.5–15.5)
WBC: 12.4 10*3/uL — AB (ref 4.0–10.5)

## 2016-10-15 LAB — BASIC METABOLIC PANEL
ANION GAP: 8 (ref 5–15)
BUN: 11 mg/dL (ref 6–20)
CHLORIDE: 94 mmol/L — AB (ref 101–111)
CO2: 29 mmol/L (ref 22–32)
CREATININE: 0.83 mg/dL (ref 0.61–1.24)
Calcium: 8.8 mg/dL — ABNORMAL LOW (ref 8.9–10.3)
GFR calc non Af Amer: >60 mL/min (ref >60)
Glucose, Bld: 133 mg/dL — ABNORMAL HIGH (ref 65–99)
POTASSIUM: 4.3 mmol/L (ref 3.5–5.1)
SODIUM: 131 mmol/L — AB (ref 135–145)

## 2016-10-15 LAB — I-STAT TROPONIN, ED: Troponin i, poc: 0.02 ng/mL (ref 0.00–0.08)

## 2016-10-15 LAB — TROPONIN I: TROPONIN I: 0.15 ng/mL — AB (ref <0.03)

## 2016-10-15 LAB — BRAIN NATRIURETIC PEPTIDE: B NATRIURETIC PEPTIDE 5: 812.8 pg/mL — AB (ref 0.0–100.0)

## 2016-10-15 MED ORDER — BUDESONIDE 0.25 MG/2ML IN SUSP
0.2500 mg | Freq: Two times a day (BID) | RESPIRATORY_TRACT | Status: DC
  Administered 2016-10-15 – 2016-10-20 (×10): 0.25 mg via RESPIRATORY_TRACT
  Filled 2016-10-15 (×11): qty 2

## 2016-10-15 MED ORDER — LEVALBUTEROL HCL 0.63 MG/3ML IN NEBU
0.6300 mg | INHALATION_SOLUTION | RESPIRATORY_TRACT | Status: DC | PRN
  Administered 2016-10-16: 0.63 mg via RESPIRATORY_TRACT
  Filled 2016-10-15: qty 3

## 2016-10-15 MED ORDER — ENOXAPARIN SODIUM 30 MG/0.3ML ~~LOC~~ SOLN
30.0000 mg | SUBCUTANEOUS | Status: DC
  Administered 2016-10-15 – 2016-10-19 (×5): 30 mg via SUBCUTANEOUS
  Filled 2016-10-15 (×5): qty 0.3

## 2016-10-15 MED ORDER — TAMSULOSIN HCL 0.4 MG PO CAPS
0.4000 mg | ORAL_CAPSULE | Freq: Every evening | ORAL | Status: DC
  Administered 2016-10-15 – 2016-10-20 (×6): 0.4 mg via ORAL
  Filled 2016-10-15 (×6): qty 1

## 2016-10-15 MED ORDER — METOPROLOL TARTRATE 12.5 MG HALF TABLET
12.5000 mg | ORAL_TABLET | Freq: Two times a day (BID) | ORAL | Status: DC
  Administered 2016-10-15 – 2016-10-20 (×10): 12.5 mg via ORAL
  Filled 2016-10-15 (×10): qty 1

## 2016-10-15 MED ORDER — AMIODARONE HCL 200 MG PO TABS
400.0000 mg | ORAL_TABLET | Freq: Every day | ORAL | Status: DC
  Administered 2016-10-16 – 2016-10-20 (×5): 400 mg via ORAL
  Filled 2016-10-15 (×5): qty 2

## 2016-10-15 MED ORDER — IOPAMIDOL (ISOVUE-370) INJECTION 76%
INTRAVENOUS | Status: AC
  Administered 2016-10-15: 100 mL
  Filled 2016-10-15: qty 100

## 2016-10-15 MED ORDER — LEVALBUTEROL HCL 0.63 MG/3ML IN NEBU: 0.6300 mg | INHALATION_SOLUTION | Freq: Four times a day (QID) | RESPIRATORY_TRACT | Status: DC

## 2016-10-15 MED ORDER — ARFORMOTEROL TARTRATE 15 MCG/2ML IN NEBU
15.0000 ug | INHALATION_SOLUTION | Freq: Two times a day (BID) | RESPIRATORY_TRACT | Status: DC
  Administered 2016-10-15 – 2016-10-20 (×10): 15 ug via RESPIRATORY_TRACT
  Filled 2016-10-15 (×10): qty 2

## 2016-10-15 MED ORDER — FUROSEMIDE 10 MG/ML IJ SOLN
40.0000 mg | Freq: Once | INTRAMUSCULAR | Status: AC
  Administered 2016-10-15: 40 mg via INTRAVENOUS
  Filled 2016-10-15: qty 4

## 2016-10-15 MED ORDER — LEVALBUTEROL HCL 0.63 MG/3ML IN NEBU
0.6300 mg | INHALATION_SOLUTION | RESPIRATORY_TRACT | Status: DC
  Administered 2016-10-15: 0.63 mg via RESPIRATORY_TRACT
  Filled 2016-10-15: qty 3

## 2016-10-15 MED ORDER — METHYLPREDNISOLONE SODIUM SUCC 125 MG IJ SOLR: 125.0000 mg | Freq: Once | INTRAMUSCULAR | Status: DC

## 2016-10-15 MED ORDER — DEXTROSE 5 % IV SOLN
1.0000 g | INTRAVENOUS | Status: DC
  Administered 2016-10-15: 1 g via INTRAVENOUS
  Filled 2016-10-15 (×2): qty 10

## 2016-10-15 MED ORDER — LEVALBUTEROL HCL 0.63 MG/3ML IN NEBU
0.6300 mg | INHALATION_SOLUTION | Freq: Four times a day (QID) | RESPIRATORY_TRACT | Status: DC
  Administered 2016-10-16 – 2016-10-17 (×6): 0.63 mg via RESPIRATORY_TRACT
  Filled 2016-10-15 (×8): qty 3

## 2016-10-15 MED ORDER — IPRATROPIUM BROMIDE 0.02 % IN SOLN
0.5000 mg | RESPIRATORY_TRACT | Status: DC
  Administered 2016-10-15: 0.5 mg via RESPIRATORY_TRACT
  Filled 2016-10-15: qty 2.5

## 2016-10-15 MED ORDER — IPRATROPIUM BROMIDE 0.02 % IN SOLN: 0.5000 mg | RESPIRATORY_TRACT | Status: DC

## 2016-10-15 MED ORDER — METHYLPREDNISOLONE SODIUM SUCC 125 MG IJ SOLR
60.0000 mg | Freq: Four times a day (QID) | INTRAMUSCULAR | Status: DC
  Administered 2016-10-15 – 2016-10-16 (×4): 60 mg via INTRAVENOUS
  Filled 2016-10-15 (×4): qty 2

## 2016-10-15 MED ORDER — IPRATROPIUM BROMIDE 0.02 % IN SOLN
0.5000 mg | Freq: Once | RESPIRATORY_TRACT | Status: AC
  Administered 2016-10-15: 0.5 mg via RESPIRATORY_TRACT
  Filled 2016-10-15: qty 2.5

## 2016-10-15 MED ORDER — SODIUM CHLORIDE 0.9% FLUSH: 3.0000 mL | INTRAVENOUS | Status: DC | PRN

## 2016-10-15 MED ORDER — ALBUTEROL SULFATE HFA 108 (90 BASE) MCG/ACT IN AERS
2.0000 | INHALATION_SPRAY | Freq: Once | RESPIRATORY_TRACT | Status: AC
  Administered 2016-10-15: 2 via RESPIRATORY_TRACT
  Filled 2016-10-15: qty 6.7

## 2016-10-15 MED ORDER — ONDANSETRON HCL 4 MG/2ML IJ SOLN: 4.0000 mg | Freq: Four times a day (QID) | INTRAMUSCULAR | Status: DC | PRN

## 2016-10-15 MED ORDER — SODIUM CHLORIDE 0.9 % IV SOLN: 250.0000 mL | INTRAVENOUS | Status: DC | PRN

## 2016-10-15 MED ORDER — ALBUTEROL (5 MG/ML) CONTINUOUS INHALATION SOLN
10.0000 mg/h | INHALATION_SOLUTION | Freq: Once | RESPIRATORY_TRACT | Status: AC
  Administered 2016-10-15: 10 mg/h via RESPIRATORY_TRACT
  Filled 2016-10-15: qty 20

## 2016-10-15 MED ORDER — ATORVASTATIN CALCIUM 20 MG PO TABS
20.0000 mg | ORAL_TABLET | Freq: Every day | ORAL | Status: DC
  Administered 2016-10-16: 20 mg via ORAL
  Filled 2016-10-15: qty 1

## 2016-10-15 MED ORDER — ACETAMINOPHEN 325 MG PO TABS
650.0000 mg | ORAL_TABLET | ORAL | Status: DC | PRN
  Administered 2016-10-16 – 2016-10-19 (×3): 650 mg via ORAL
  Filled 2016-10-15 (×3): qty 2

## 2016-10-15 MED ORDER — NICOTINE 21 MG/24HR TD PT24
21.0000 mg | MEDICATED_PATCH | Freq: Every day | TRANSDERMAL | Status: DC
  Administered 2016-10-16 – 2016-10-20 (×5): 21 mg via TRANSDERMAL
  Filled 2016-10-15 (×5): qty 1

## 2016-10-15 MED ORDER — METHYLPREDNISOLONE SODIUM SUCC 125 MG IJ SOLR
125.0000 mg | Freq: Once | INTRAMUSCULAR | Status: AC
  Administered 2016-10-15: 125 mg via INTRAVENOUS
  Filled 2016-10-15: qty 2

## 2016-10-15 MED ORDER — SODIUM CHLORIDE 0.9% FLUSH
3.0000 mL | Freq: Two times a day (BID) | INTRAVENOUS | Status: DC
Start: 2016-10-15 — End: 2016-10-20
  Administered 2016-10-15 – 2016-10-20 (×9): 3 mL via INTRAVENOUS

## 2016-10-15 MED ORDER — ASPIRIN EC 325 MG PO TBEC
325.0000 mg | DELAYED_RELEASE_TABLET | Freq: Every day | ORAL | Status: DC
  Administered 2016-10-16 – 2016-10-20 (×5): 325 mg via ORAL
  Filled 2016-10-15 (×5): qty 1

## 2016-10-15 MED ORDER — POTASSIUM CHLORIDE CRYS ER 20 MEQ PO TBCR
20.0000 meq | EXTENDED_RELEASE_TABLET | Freq: Every day | ORAL | Status: DC
  Administered 2016-10-16 – 2016-10-20 (×5): 20 meq via ORAL
  Filled 2016-10-15 (×5): qty 1

## 2016-10-15 MED ORDER — FUROSEMIDE 10 MG/ML IJ SOLN
40.0000 mg | Freq: Two times a day (BID) | INTRAMUSCULAR | Status: DC
  Administered 2016-10-15 – 2016-10-16 (×2): 40 mg via INTRAVENOUS
  Filled 2016-10-15 (×2): qty 4

## 2016-10-15 MED ORDER — PANTOPRAZOLE SODIUM 40 MG PO TBEC
40.0000 mg | DELAYED_RELEASE_TABLET | Freq: Every day | ORAL | Status: DC
  Administered 2016-10-16 – 2016-10-20 (×5): 40 mg via ORAL
  Filled 2016-10-15 (×5): qty 1

## 2016-10-15 MED ORDER — IPRATROPIUM BROMIDE 0.02 % IN SOLN
0.5000 mg | Freq: Four times a day (QID) | RESPIRATORY_TRACT | Status: DC
  Administered 2016-10-16 – 2016-10-17 (×6): 0.5 mg via RESPIRATORY_TRACT
  Filled 2016-10-15 (×8): qty 2.5

## 2016-10-15 MED ORDER — NICOTINE 21 MG/24HR TD PT24
21.0000 mg | MEDICATED_PATCH | Freq: Once | TRANSDERMAL | Status: AC
  Administered 2016-10-15: 21 mg via TRANSDERMAL
  Filled 2016-10-15: qty 1

## 2016-10-15 MED ORDER — ONDANSETRON HCL 4 MG/2ML IJ SOLN
4.0000 mg | Freq: Once | INTRAMUSCULAR | Status: AC
  Administered 2016-10-15: 4 mg via INTRAVENOUS
  Filled 2016-10-15: qty 2

## 2016-10-15 NOTE — Progress Notes (Signed)
[address]       [place] [address]             [phone]      [name] was readmitted from the ED today with shortness of breath.  He is a 71 yo man with a history of tobacco abuse and severe COPD (FEV1= 0.75, improves to 0.98 with bronchodilators). Also has unexplained weight loss and BPH. Admitted to [facility] in early June with SOB and Cp and ruled in for MI. Did not have a cardiac workup at that time.  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  I did CABG x 4 on 7/13.   Postop he had atrial fibrillation which converted with amiodarone. He also had issues with urinary retention.  He was discharged on home O2 on 7/20.  He says he became short of breath last night. Also has noted increased swelling in his legs. He ran out of albuterol at home.  Current Meds  Medication Sig  . albuterol (PROAIR HFA) 108 (90 BASE) MCG/ACT inhaler Inhale 2 puffs into the lungs every 6 (six) hours as needed for wheezing or shortness of breath.  Marland Kitchen amiodarone (PACERONE) 400 MG tablet Take 1 tablet (400 mg total) by mouth daily.  Marland Kitchen aspirin EC 325 MG EC tablet Take 1 tablet (325 mg total) by mouth daily.  Marland Kitchen atorvastatin (LIPITOR) 20 MG tablet Take 1 tablet (20 mg total) by mouth daily at 6 PM.  . furosemide (LASIX) 40 MG tablet Take 1 tablet (40 mg total) by mouth daily.  Marland Kitchen ipratropium (ATROVENT) 0.02 % nebulizer solution Take 0.5 mg by nebulization daily as needed for wheezing or shortness of breath.   . metoprolol tartrate (LOPRESSOR) 25 MG tablet Take 0.5 tablets (12.5 mg total) by mouth 2 (two) times daily.  Marland Kitchen omeprazole (PRILOSEC) 40 MG capsule Take 40 mg by mouth daily.  . potassium chloride SA (K-DUR,KLOR-CON) 20 MEQ tablet Take 1 tablet (20 mEq total) by mouth daily.  . tamsulosin (FLOMAX) 0.4 MG CAPS Take 0.4 mg by mouth every evening.  . traMADol (ULTRAM) 50 MG tablet Take 1 tablet (50 mg  total) by mouth every 6 (six) hours as needed for moderate pain.   On exam BP 100/77 (BP Location: Right Arm)   Pulse 92   Temp 98.4 F (36.9 C) (Oral)   Resp 20   Ht 5\' 8"  (1.727 m)   Wt 119 lb 11.2 oz (54.3 kg)   SpO2 95%   BMI 18.20 kg/m  Frail 71 yo man appears older than stated age Lungs diminished bilaterally with faint wheeze Cardiac - tachycardic, irregularly irregular Wound clean and dry 2+ edema LLE, 3+ RLE  CXR shows small bilateral effusions  BNP= 812, creatinine 0.83, Hct= 33  Impression- 15 man with severe COPD and left main and 3 vessel CAD s/p recent CABG presents with shortness of breath. His BNP is elevated c/w decompensated acute on chronic congestive heart failure. He is not having CP so I think MI unlikely. Initial troponin 0.02. He was in SR in the ED but is atrial fibrillation currently. I suspect he has been going in and out of fib at home and that triggered his decompensation. He is on amidarone. Will likely need to titrate beta blocker up although he may not tolerate that well with his COPD. Adding diltiazem or digoxin is another  option.  At this point he probably needs anticoagulation for his atrial fib. NOAC might be preferable to coumadin  Agree with plan to obtain CT angio to r/o PE although I think that is far less likely.  Agree with diuresis and bronchodilators  Will follow  Salvatore DecentSteven C. Dorris FetchHendrickson, MD [facility] [phone]

## 2016-10-15 NOTE — H&P (Signed)
History and Physical        Hospital Admission Note Date: 10/15/2016  Patient name: Devin CostainFrank L Diegel Medical record number: [id number] Date of birth: 1945/07/30 Age: 71 y.o. Gender: male  PCP: [name], John, MD    Patient coming from: home   [facility].    Chief Complaint:  Worsening SOB, coughing with brownish phlegm, orthopnea, LE edema worsening in last 2 days   HPI: Patient is a 71 year old male with nicotine abuse (>50pack years), COPD, CAD s/p CABG on 10/05/16, combined systolic and diastolic CHF echo with EF 25-30% with grade 1 diastolic dysfunction, HTN who was just discharged on 7/21 from CT surgery service after CABG. Patient states since he has been home, he has been having more dyspnea, cough, congestion with brownish phlegm. Patient has orthopnea, sitting up upright and worsening LE edema. He reports that his right leg was somewhat swollen when he was discharged but today he noticed that his left leg is getting swollen.   He denied any fever, chills, chest pain or diaphoresis. No prior history of DVT or PE.  He felt so congested that he was using albuterol inhaler and ran out of albuterol inhaler.  Patient states   ED work-up/course:  Temp 97.9, RR 20, BP 113/65, O2 sats 100% 2L  BNP 812 Trop 0.02 CBC, BMET unremarkable  CXR : small left and trace right pleural effusions  EKG: NSR, 75, QTc 527,    Review of Systems: Positives marked in 'bold' Constitutional: Denies fever, chills, diaphoresis, + poor appetite and fatigue.  HEENT: Denies photophobia, eye pain, redness, hearing loss, ear pain, congestion, sore throat, rhinorrhea, sneezing, mouth sores, trouble swallowing, neck pain, neck stiffness and tinnitus.   Respiratory: please see HPI  Cardiovascular: Denies chest pain, palpitations and leg swelling.    Gastrointestinal: Denies nausea, vomiting, abdominal pain, diarrhea, constipation, blood in stool and abdominal distention.  Genitourinary: Denies dysuria, urgency, frequency, hematuria, flank pain and difficulty urinating.  Musculoskeletal: Denies myalgias, back pain, joint swelling, arthralgias and gait problem.  Skin: Denies pallor, rash and wound.  Neurological: Denies dizziness, seizures, syncope, weakness, light-headedness, numbness and headaches.  Hematological: Denies adenopathy. Easy bruising, personal or family bleeding history  Psychiatric/Behavioral: Denies suicidal ideation, mood changes, confusion, nervousness, sleep disturbance and agitation  Past Medical History: Past Medical History:  Diagnosis Date  . Asthma   . Cigarette smoker   . COPD (chronic obstructive pulmonary disease) (HCC)   . GERD (gastroesophageal reflux disease)     Past Surgical History:  Procedure Laterality Date  . ABDOMINAL HERNIA REPAIR    . BOWEL RESECTION     Bowel perf secondary to trauma  . CARDIAC CATHETERIZATION    . CORONARY ARTERY BYPASS GRAFT N/A 10/05/2016   Procedure: CORONARY ARTERY BYPASS GRAFTING (CABG) x4 with FREE MAMMARY.  ENDOSCOPIC HARVESTING OF RIGHT SAPHENOUS VEIN. (FREE LIMA to LAD, SVG to OM, SVG SEQUENTIALLY to ACUTE MARGINAL and PDA);  Surgeon: Loreli SlotHendrickson, Steven C, MD;  [facility];  Service: Open Heart Surgery;  Laterality: N/A;  . EXPLORATORY LAPAROTOMY  1986   [name]/notes 08/08/2010  . FOOT FRACTURE SURGERY Left ~  1965  . FRACTURE SURGERY    . HERNIA REPAIR    . INCISIONAL HERNIA REPAIR  05/2005   [name] 08/08/2010  . INGUINAL HERNIA REPAIR Bilateral   . INGUINAL HERNIA REPAIR Right 09/2006   recurrent/notes 07/27/2010  . ORIF FOOT FRACTURE Left 1990   [name] 08/08/2010  . RIGHT/LEFT HEART CATH AND CORONARY ANGIOGRAPHY N/A 10/04/2016   Procedure: Right/Left Heart Cath and Coronary Angiography;  Surgeon: Corky Crafts, MD;  Location: [facility] INVASIVE CV LAB;  Service:  Cardiovascular;  Laterality: N/A;  . TEE WITHOUT CARDIOVERSION N/A 10/05/2016   Procedure: TRANSESOPHAGEAL ECHOCARDIOGRAM (TEE);  Surgeon: Loreli Slot, MD;  Location: [facility];  Service: Open Heart Surgery;  Laterality: N/A;  . VENTRAL HERNIA REPAIR  09/2006   recurrent/notes 07/27/2010    Medications: Prior to Admission medications   Medication Sig Start Date End Date Taking? Authorizing Provider  albuterol (PROAIR HFA) 108 (90 BASE) MCG/ACT inhaler Inhale 2 puffs into the lungs every 6 (six) hours as needed for wheezing or shortness of breath.   Yes [provider]  amiodarone (PACERONE) 400 MG tablet Take 1 tablet (400 mg total) by mouth daily. 10/15/16  Yes Sharlene Dory, PA-C  aspirin EC 325 MG EC tablet Take 1 tablet (325 mg total) by mouth daily. 10/13/16  Yes Asa Lente, Tessa N, PA-C  atorvastatin (LIPITOR) 20 MG tablet Take 1 tablet (20 mg total) by mouth daily at 6 PM. 10/12/16  Yes Asa Lente, Tessa N, PA-C  furosemide (LASIX) 40 MG tablet Take 1 tablet (40 mg total) by mouth daily. 10/13/16  Yes Conte, Tessa N, PA-C  ipratropium (ATROVENT) 0.02 % nebulizer solution Take 0.5 mg by nebulization daily as needed for wheezing or shortness of breath.  08/28/16  Yes [provider]  metoprolol tartrate (LOPRESSOR) 25 MG tablet Take 0.5 tablets (12.5 mg total) by mouth 2 (two) times daily. 10/12/16  Yes Conte, Tessa N, PA-C  omeprazole (PRILOSEC) 40 MG capsule Take 40 mg by mouth daily.   Yes [provider]  potassium chloride SA (K-DUR,KLOR-CON) 20 MEQ tablet Take 1 tablet (20 mEq total) by mouth daily. 10/13/16  Yes Conte, Tessa N, PA-C  tamsulosin (FLOMAX) 0.4 MG CAPS Take 0.4 mg by mouth every evening.   Yes [provider]  traMADol (ULTRAM) 50 MG tablet Take 1 tablet (50 mg total) by mouth every 6 (six) hours as needed for moderate pain. 10/12/16  Yes Conte, Tessa N, PA-C  amiodarone (PACERONE) 400 MG tablet Take 1 tablet (400 mg total) by mouth every 12  (twelve) hours. Patient not taking: Reported on 10/15/2016 10/12/16   Sharlene Dory, PA-C    Allergies:   Allergies  Allergen Reactions  . No Known Allergies     Social History:  reports that he has been smoking Cigarettes.  He has a 55.00 pack-year smoking history. He has never used smokeless tobacco. He reports that he does not drink alcohol or use drugs.  Family History: Family History  Problem Relation Age of Onset  . Heart disease Mother        No details  . Alzheimer's disease Mother   . Atopy Neg Hx     Physical Exam: Blood pressure 113/65, pulse 92, temperature 97.9 F (36.6 C), temperature source Oral, resp. rate 20, height 5\' 8"  (1.727 m), weight 54.3 kg (119 lb 11.2 oz), SpO2 95 %. General: Alert, awake, oriented x3, in no acute distress. Eyes: pink conjunctiva,anicteric sclera, pupils equal and reactive to light  and accomodation, HEENT: normocephalic, atraumatic, oropharynx clear Neck: supple, no masses or lymphadenopathy, no goiter, no bruits CVS: Regular rate and rhythm, without murmurs, rubs or gallops. 1+ lower extremity edema Resp : Bilateral diffuse rhonchi with crackles  GI : Soft, nontender, nondistended, positive bowel sounds, no masses. No hepatomegaly. No hernia.  Musculoskeletal: No clubbing or cyanosis, positive pedal pulses. No contracture. ROM intact  Neuro: Grossly intact, no focal neurological deficits, strength 5/5 upper and lower extremities bilaterally Psych: alert and oriented x 3, normal mood and affect Skin: no rashes or lesions, warm and dry, CABG staples    LABS on Admission: I have personally reviewed all the labs and imagings below    Basic Metabolic Panel:  Recent Labs Lab 10/10/16 0234 10/15/16 1258  NA 133* 131*  K 4.1 4.3  CL 94* 94*  CO2 29 29  GLUCOSE 119* 133*  BUN 11 11  CREATININE 0.74 0.83  CALCIUM 8.4* 8.8*   Liver Function Tests: No results for input(s): AST, ALT, ALKPHOS, BILITOT, PROT, ALBUMIN in the last 168  hours. No results for input(s): LIPASE, AMYLASE in the last 168 hours. No results for input(s): AMMONIA in the last 168 hours. CBC:  Recent Labs Lab 10/09/16 0804 10/15/16 1258  WBC 9.0 12.4*  HGB 9.9* 10.8*  HCT 29.6* 32.8*  MCV 88.6 90.6  PLT 204 705*   Cardiac Enzymes: No results for input(s): CKTOTAL, CKMB, CKMBINDEX, TROPONINI in the last 168 hours. BNP: Invalid input(s): POCBNP CBG:  Recent Labs Lab 10/10/16 2337 10/11/16 0400  GLUCAP 106* 96    Radiological Exams on Admission:  Dg Chest 2 View  Result Date: 10/15/2016 CLINICAL DATA:  Cough, shortness of breath EXAM: CHEST  2 VIEW COMPARISON:  10/09/2016 FINDINGS: Small left and trace right pleural effusions. Mild patchy left lower lobe opacity, likely atelectasis. No Daruis interstitial edema.  No pneumothorax. The heart is normal in size. Postsurgical changes related to prior CABG. Median sternotomy.  Midline skin staples. Thoracic spine is within normal limits. IMPRESSION: Small left and trace right pleural effusions. Electronically Signed   By: Charline Bills M.D.   On: 10/15/2016 13:48      EKG: Independently reviewed EKG: NSR, 75, QTc 527, LBBB   Assessment/Plan Principal Problem:   Acute respiratory failure (HCC) due to COPD exacerbation, acute on chronic systolic and diastolic CHF exacerbation - Per patient, he has been congested, with coughing, productive phlegm, on O2 3 L at home, ran out of albuterol - Given tachycardia, place on Xopenex, Atrovent, scheduled nebs, IV Solu-Medrol - Place on IV Rocephin, chest x-ray shows no pneumonia -Placed on flutter valve, Brovana, Pulmicort, will benefit from outpatient pulmonology appointment, follows Dr. Sherene Sires, office note shows last seen 5 years ago - obtain CT angiogram of the chest to rule out pulmonary embolism - Home O2 evaluation prior to discharge   Active Problems: Acute on chronic combined systolic and diastolic CHF exacerbation - Patient reports  orthopnea, worsening lower extremity edema, has been taking Lasix at home, BNP 812.8, chest x-ray with bilateral pleural effusions - Recent echo 08/2016 shows EF of 25-30% with diffuse hypokinesis, severe hypokinesis of the anteroseptal and apical myocardium, grade 1 diastolic dysfunction - Placed on strict I's and O's, daily weights - Placed on IV Lasix 40 mg q12hrs , venous Dopplers to rule out DVT - Given recent CABG, discharge 2 days ago, will consult cardiology (sent in box message)    HYPERTENSION, BENIGN - Currently stable, currently tachycardiac, will continue Lopressor  low-dose 12.5 mg twice daily   Paroxysmal Atrial fibrillation - Per patient, he developed paroxysmal atrial fibrillation after the surgery, was placed on amiodarone - Currently normal sinus rhythm, will continue amiodarone, currently on 400 mg daily,  metoprolol for rate control  - Not on anticoagulation, on aspirin 325 mg daily, will continue    CAD (coronary artery disease) Status post recent CABG - Requested CT surgery consult to evaluate patient as he was recently discharged from the service 2 days ago. - Continue aspirin, beta blocker, statin, IV diuresis  GERD - Continue Protonix  BPH - Continue Flomax  Nicotine abuse: 60-pack-year history - Patient reports that he just quit smoking last week, will continue nicotine patch - Encouraged patient to not relapse.  DVT prophylaxis: lovenox  CODE STATUS: Full CODE STATUS   Consults called: CT surgery, cardiology   Family Communication: Admission, patients condition and plan of care including tests being ordered have been discussed with the patient and wife, daughter  who indicates understanding and agree with the plan and Code Status  Admission status: Inpatient telemetry   Disposition plan: Further plan will depend as patient's clinical course evolves and further radiologic and laboratory data become available.    At the time of admission, it appears  that the appropriate admission status for this patient is INPATIENT . This is judged to be reasonable and necessary in order to provide the required intensity of service to ensure the patient's safety given the presenting symptoms shortness of breath, respiratory failure CHF exacerbation, recent CABG, discharge 2 days, physical exam findings, and initial radiographic and laboratory data in the context of their chronic comorbidities.  The medical decision making on this patient was of high complexity and the patient is at high risk for clinical deterioration, therefore this is a level 3 visit.   Time Spent on Admission:     Ripudeep Rai M.D. [facility] 10/15/2016, 5:21 PM Pager: [phone]  If 7PM-7AM, please contact night-coverage [email or web address] Password [email or web address]

## 2016-10-15 NOTE — ED Triage Notes (Signed)
Pt presents with c/o shortness of breath. He was discharged from [place] recently after CABG surgery. Since he's been home he's had increasing shortness of breath, cough congestion, chest pain, and lower extremity edema.

## 2016-10-15 NOTE — ED Provider Notes (Signed)
[facility] Emergency Department Provider Note  ED Clinical Impression   Shortness of breath  COPD exacerbation  Congestive heart failure, unspecified HF chronicity, unspecified heart failure type  History   Chief Complaint Shortness of Breath   HPI  Patient is a 71 y.o. male with a PMH of asthma, COPD, GERD, HTN, CHF, CAD s/p CABG on 10/05/16 discharged on 10/12/16 who presents to ED for shortness of breath, productive cough with yellow sputum, and bilateral lower extremity edema, onset approximately 2 days ago, states he has run out of his albuterol inhaler and nebulizer, is on 3L [place] at home. Patient also endorses increased orthopnea and increased DOE. Denies fevers, chills, dizziness, vision or gait changes, CP, pleurisy, hemoptysis, abd pain, n/v/d, dysuria, extremity weakness/numbness/tingling, or any additional concerns. No anticoagulant use. No hx of DVT/PE.   Past Medical History:  Diagnosis Date  . Asthma   . Cigarette smoker   . COPD (chronic obstructive pulmonary disease) (HCC)   . GERD (gastroesophageal reflux disease)     Past Surgical History:  Procedure Laterality Date  . ABDOMINAL HERNIA REPAIR    . BOWEL RESECTION     Bowel perf secondary to trauma  . CARDIAC CATHETERIZATION    . CORONARY ARTERY BYPASS GRAFT N/A 10/05/2016   Procedure: CORONARY ARTERY BYPASS GRAFTING (CABG) x4 with FREE MAMMARY.  ENDOSCOPIC HARVESTING OF RIGHT SAPHENOUS VEIN. (FREE LIMA to LAD, SVG to OM, SVG SEQUENTIALLY to ACUTE MARGINAL and PDA);  Surgeon: Loreli Slot, MD;  Location: [facility] OR;  Service: Open Heart Surgery;  Laterality: N/A;  . EXPLORATORY LAPAROTOMY  1986   [name] 08/08/2010  . FOOT FRACTURE SURGERY Left ~ 1965  . FRACTURE SURGERY    . HERNIA REPAIR    . INCISIONAL HERNIA REPAIR  05/2005   [name] 08/08/2010  . INGUINAL HERNIA REPAIR Bilateral   . INGUINAL HERNIA REPAIR Right 09/2006   recurrent/notes 07/27/2010  . ORIF FOOT FRACTURE Left 1990   [name] 08/08/2010  .  RIGHT/LEFT HEART CATH AND CORONARY ANGIOGRAPHY N/A 10/04/2016   Procedure: Right/Left Heart Cath and Coronary Angiography;  Surgeon: Corky Crafts, MD;  Location: [facility] INVASIVE CV LAB;  Service: Cardiovascular;  Laterality: N/A;  . TEE WITHOUT CARDIOVERSION N/A 10/05/2016   Procedure: TRANSESOPHAGEAL ECHOCARDIOGRAM (TEE);  Surgeon: Loreli Slot, MD;  Location: [facility] OR;  Service: Open Heart Surgery;  Laterality: N/A;  . VENTRAL HERNIA REPAIR  09/2006   recurrent/notes 07/27/2010    Current Outpatient Rx  . Order #: [id number] Class: Historical Med  . Order #: [id number] Class: Print  . Order #: [id number] Class: Print  . Order #: [id number] Class: Normal  . Order #: [id number] Class: Print  . Order #: [id number] Class: Print  . Order #: [id number] Class: Historical Med  . Order #: [id number] Class: Print  . Order #: [id number] Class: Historical Med  . Order #: [id number] Class: Print  . Order #: [id number] Class: Historical Med  . Order #: [id number] Class: Print    Allergies No known allergies  Family History  Problem Relation Age of Onset  . Heart disease Mother        No details  . Alzheimer's disease Mother   . Atopy Neg Hx     Social History Social History  Substance Use Topics  . Smoking status: Current Every Day Smoker    Packs/day: 1.00    Years: 55.00    Types: Cigarettes  . Smokeless tobacco: Never Used  . Alcohol use No    Review of Systems  Constitutional: Negative for fever, chills,  or unexplained weight loss. Eyes: Negative for visual changes. ENT:+congestion. Negative for ear pain or sore throat. Cardiovascular: +extremity swelling. Negative for chest pain or palpitations. Respiratory: +shortness of breath, cough. Negative for hemoptysis or pleurisy. Gastrointestinal: Negative for abdominal pain, nausea, vomiting, or diarrhea. Genitourinary: Negative for dysuria, urinary frequency, or hematuria. Musculoskeletal: Negative for back pain or extremity pain. Skin: Negative  for rash. Neurological: Negative for headaches, dizziness, focal weakness, or numbness/tingling.  Physical Exam   VITAL SIGNS:   ED Triage Vitals  Enc Vitals Group     BP 10/15/16 1315 113/66     Pulse Rate 10/15/16 1315 79     Resp 10/15/16 1315 20     Temp --      Temp src --      SpO2 10/15/16 1315 100 %     Weight --      Height --      Head Circumference --      Peak Flow --      Pain Score 10/15/16 1257 5     Pain Loc --      Pain Edu? --      Excl. in GC? --     Constitutional: Alert and oriented. Well appearing and in no acute respiratory apparent distress. Eyes: PERRL, EOMI, Conjunctivae normal ENT      Head: Normocephalic and atraumatic.      Ears: TM intact bilaterally without erythema or effusion, no hemotympanum, external ear canals normal.       Nose: +nasal congestion.      Mouth/Throat: Mucous membranes are moist. Oropharynx without erythema or exudate. No trismus. Normal voice, handling secretions normally.      Neck: Supple, no nuchal signs, full active ROM of neck.  Cardiovascular: Normal S1 S2, regular rhythm, normal rate. Normal and symmetric distal pulses are present in all extremities.CABG incision site to chest healing well without erythema, warmth, bleeding, or drainage; staples in place. 1+ pitting edema to bilateral LE, dorsalis pedis pulses 2+. Right leg incision site without surrounding erythema or warmth, no bleeding or drainage.  Respiratory: +bilateral diffuse rhonchi and scattered wheezes. Normal respiratory effort.  Gastrointestinal: Abdomen soft and nontender. No rebound or guarding. There is no CVA tenderness. Back: No midline tenderness, no stepoff.  Musculoskeletal: Nontender with normal range of motion in all extremities. Neurologic: Speech clear. Alert and appropriate, no gross focal neurologic deficits are appreciated. Equal strength in all four extremities. Extremities neurovascularly intact.  Skin: Skin is warm and dry.  Psychiatric:  Mood and affect are normal. Speech and behavior are normal.  Labs   Labs Reviewed  BASIC METABOLIC PANEL - Abnormal; Notable for the following:       Result Value   Sodium 131 (*)    Chloride 94 (*)    Glucose, Bld 133 (*)    Calcium 8.8 (*)    All other components within normal limits  CBC - Abnormal; Notable for the following:    WBC 12.4 (*)    RBC 3.62 (*)    Hemoglobin 10.8 (*)    HCT 32.8 (*)    Platelets 705 (*)    All other components within normal limits  BRAIN NATRIURETIC PEPTIDE  I-STAT TROPONIN, ED    Radiology   DG Chest 2 View  Final Result      EKG  EKG: normal sinus rhythm with rate of 75 bpm, left bundle branch block, no STEMI.   ED Course, Assessment and Plan   Pt is  a 71 y/o M who presents to ED for SOB, cough, DOE, and peripheral edema, concern for COPD exacerbation vs. CHF exacerbation vs. Pneumonia vs. ACS vs. PE. Will get labs, CXR for pneumonia/effusions. Low suspicion for pericarditis or pneumothorax. Doubt aortic dissection - no chest pain, peripheral pulse strong and equal, no aortic regurgitation murmur. Will plan to give nebs and steroids; re-eval.  2:33 PM CXR with small left and trace right pleural effusions, trop negative. Given one albuterol neb. On re-eval, +rhonchi and wheezing. Will order 1 hr neb and solumedrol.  3:30 PM On re-eval, pt without significant relief with neb. Will plan to admit to [facility].   3:49 PM BNP 812.8. Will order 40mg  IV Lasix. Discussed with Dr. Isidoro Donning, [facility] who will admit but requesting consult by cardiothoracic surgery in setting of recent CABG; consult placed.  4:22 PM Spoke with Dr. Dorris Fetch, cardiothoracic surgery, will see patient while admitted. Dr. Isidoro Donning repaged to update.  4:34 PM Discussed with Dr. Isidoro Donning and updated on discussion with Dr. Dorris Fetch. Pt admitted to [facility].  The patient was discussed with and seen by Dr. Lynelle Doctor who agrees with the treatment  plan.  Previous chart, nursing notes, and vital signs reviewed.    Pertinent labs & imaging results that were available during my care of the patient were reviewed by me and considered in my medical decision making (see chart for details).     [place], [place], NP 10/19/16 2315    Linwood Dibbles, MD 10/23/16 [phone]

## 2016-10-15 NOTE — Progress Notes (Signed)
CRITICAL VALUE ALERT  Critical Value:  Troponin 0.15  Date & Time Notied:  10/15/2016 / 1916  Provider Notified: Dr. Toniann FailKakrakandy  Orders Received/Actions taken: NP Andi DevonXenia called back to acknowledge the result

## 2016-10-16 ENCOUNTER — Inpatient Hospital Stay (HOSPITAL_COMMUNITY): Payer: Medicare HMO

## 2016-10-16 DIAGNOSIS — I5021 Acute systolic (congestive) heart failure: Secondary | ICD-10-CM

## 2016-10-16 DIAGNOSIS — R609 Edema, unspecified: Secondary | ICD-10-CM

## 2016-10-16 DIAGNOSIS — I5043 Acute on chronic combined systolic (congestive) and diastolic (congestive) heart failure: Secondary | ICD-10-CM

## 2016-10-16 DIAGNOSIS — I10 Essential (primary) hypertension: Secondary | ICD-10-CM

## 2016-10-16 DIAGNOSIS — J9601 Acute respiratory failure with hypoxia: Secondary | ICD-10-CM

## 2016-10-16 LAB — BASIC METABOLIC PANEL
Anion gap: 9 (ref 5–15)
BUN: 14 mg/dL (ref 6–20)
CHLORIDE: 92 mmol/L — AB (ref 101–111)
CO2: 31 mmol/L (ref 22–32)
CREATININE: 0.88 mg/dL (ref 0.61–1.24)
Calcium: 8.9 mg/dL (ref 8.9–10.3)
GFR calc non Af Amer: >60 mL/min (ref >60)
Glucose, Bld: 149 mg/dL — ABNORMAL HIGH (ref 65–99)
POTASSIUM: 3.8 mmol/L (ref 3.5–5.1)
SODIUM: 132 mmol/L — AB (ref 135–145)

## 2016-10-16 LAB — TROPONIN I
TROPONIN I: 0.03 ng/mL — AB (ref <0.03)
TROPONIN I: 0.04 ng/mL — AB (ref <0.03)

## 2016-10-16 LAB — URINALYSIS, ROUTINE W REFLEX MICROSCOPIC
Bilirubin Urine: NEGATIVE
Glucose, UA: NEGATIVE mg/dL
Hgb urine dipstick: NEGATIVE
KETONES UR: NEGATIVE mg/dL
LEUKOCYTES UA: NEGATIVE
NITRITE: NEGATIVE
PROTEIN: NEGATIVE mg/dL
Specific Gravity, Urine: 1.017 (ref 1.005–1.030)
pH: 6 (ref 5.0–8.0)

## 2016-10-16 MED ORDER — FUROSEMIDE 10 MG/ML IJ SOLN
80.0000 mg | Freq: Two times a day (BID) | INTRAMUSCULAR | Status: DC
  Administered 2016-10-16 – 2016-10-18 (×4): 80 mg via INTRAVENOUS
  Filled 2016-10-16 (×4): qty 8

## 2016-10-16 NOTE — Progress Notes (Signed)
Every other staple removed from chest incision.  Will continue to monitor.

## 2016-10-16 NOTE — Progress Notes (Addendum)
PROGRESS NOTE    Devin Barton  BMW:[id number] DOB: Jun 21, 1945 DOA: 10/15/2016 PCP: Assunta Found, MD   Brief Narrative: 71 year old male with nicotine abuse (>50pack years), COPD, CAD s/p CABG on 10/05/16, combined systolic and diastolic CHF echo with EF 25-30% with grade 1 diastolic dysfunction, HTN who was just discharged on 7/21 from CT surgery service after CABG. patient presented with dyspnea on exertion, cough, congestion and worsening lower extremity edema consistent with CHF exacerbation.  Assessment & Plan:  #  Acute on chronic combined systolic and diastolic congestive heart failure: -Patient reported feeling mildly better today with IV diuretics. The dose of Lasix increased to 80 mg twice a day. Echo done on July 13 consistent with EF of 35-40%. Cardiology consult requested. Continue low-salt diet, instructed ins and outs, daily weight. -Continue metoprolol.  #Acute respiratory failure with hypoxia in the setting of CHF exacerbation. Currently on 2 L of oxygen. Try to wean down gradually.  #History of COPD: I think patient's symptoms are mainly contributed by congestive heart failure. Do not think patient has acute exacerbation therefore I'll discontinue Solu-Medrol. Continue bronchodilators and nebulizers and treatment. Patient follows up with pulmonologist outpatient. -Discontinue ceftriaxone. Chest x-ray with no pneumonia. -f/u doppler LE ordered on admission  #Paroxysmal atrial fibrillation: Continue amiodarone, metoprolol. On aspirin. Follow up with cardiology to evaluate if patient needs anticoagulation.  #Coronary artery disease status post recent CABG: Cardiothoracic surgeon following. Continue current cardiac medication. The chest wound is healing and has  Staples. -Continue aspirin, Lipitor -Mild elevation in troponin likely due to CHF and recent CABG.  #GERD: Continue Protonix  #BPH: Continue Flomax  DVT prophylaxis: Lovenox subcutaneous Code Status: Full  code Family Communication: Discussed with the patient's wife at bedside Disposition Plan: Likely discharge home in 1-2 days. PT OT evaluation    Consultants:   Cardiology  Cardio thoracic surgeon  Procedures: None Antimicrobials: Discontinue ceftriaxone  Subjective: Seen and examined at bedside. Reported shortness of breath is mildly better. Denied chest pain, nausea vomiting. Complaining of worsening lower extremity edema for last 3-4 days  Objective: Vitals:   10/16/16 0423 10/16/16 0614 10/16/16 0800 10/16/16 1116  BP:  (!) 113/58    Pulse:  83    Resp:      Temp:  97.9 F (36.6 C)    TempSrc:  Oral    SpO2: 95% 97% 94% 97%  Weight:  58.6 kg (129 lb 1.6 oz)    Height:        Intake/Output Summary (Last 24 hours) at 10/16/16 1547 Last data filed at 10/16/16 1006  Gross per 24 hour  Intake              650 ml  Output             1240 ml  Net             -590 ml   Filed Weights   10/15/16 1717 10/16/16 0614  Weight: 54.3 kg (119 lb 11.2 oz) 58.6 kg (129 lb 1.6 oz)    Examination:  General exam: Appears calm and comfortable  Respiratory system: Bibasal crackles, respiratory effort normal. No wheezing Cardiovascular system: S1 & S2 heard, RRR.  Bilateral lower extremities pitting edema. Gastrointestinal system: Abdomen is nondistended, soft and nontender. Normal bowel sounds heard. Central nervous system: Alert and oriented. No focal neurological deficits. Skin: No rashes, lesions or ulcers Psychiatry: Judgement and insight appear normal. Mood & affect appropriate.     Data Reviewed:  I have personally reviewed following labs and imaging studies  CBC:  Recent Labs Lab 10/15/16 1258  WBC 12.4*  HGB 10.8*  HCT 32.8*  MCV 90.6  PLT 705*   Basic Metabolic Panel:  Recent Labs Lab 10/10/16 0234 10/15/16 1258 10/16/16 0531  NA 133* 131* 132*  K 4.1 4.3 3.8  CL 94* 94* 92*  CO2 29 29 31   GLUCOSE 119* 133* 149*  BUN 11 11 14   CREATININE 0.74 0.83  0.88  CALCIUM 8.4* 8.8* 8.9   GFR: Estimated Creatinine Clearance: 63.8 mL/min (by C-G formula based on SCr of 0.88 mg/dL). Liver Function Tests: No results for input(s): AST, ALT, ALKPHOS, BILITOT, PROT, ALBUMIN in the last 168 hours. No results for input(s): LIPASE, AMYLASE in the last 168 hours. No results for input(s): AMMONIA in the last 168 hours. Coagulation Profile: No results for input(s): INR, PROTIME in the last 168 hours. Cardiac Enzymes:  Recent Labs Lab 10/15/16 1817 10/16/16 0001 10/16/16 0531  TROPONINI 0.15* 0.04* 0.03*   BNP (last 3 results) No results for input(s): PROBNP in the last 8760 hours. HbA1C: No results for input(s): HGBA1C in the last 72 hours. CBG:  Recent Labs Lab 10/10/16 1127 10/10/16 1532 10/10/16 2040 10/10/16 2337 10/11/16 0400  GLUCAP 102* 101* 122* 106* 96   Lipid Profile: No results for input(s): CHOL, HDL, LDLCALC, TRIG, CHOLHDL, LDLDIRECT in the last 72 hours. Thyroid Function Tests: No results for input(s): TSH, T4TOTAL, FREET4, T3FREE, THYROIDAB in the last 72 hours. Anemia Panel: No results for input(s): VITAMINB12, FOLATE, FERRITIN, TIBC, IRON, RETICCTPCT in the last 72 hours. Sepsis Labs: No results for input(s): PROCALCITON, LATICACIDVEN in the last 168 hours.  No results found for this or any previous visit (from the past 240 hour(s)).       Radiology Studies: Dg Chest 2 View  Result Date: 10/15/2016 CLINICAL DATA:  Cough, shortness of breath EXAM: CHEST  2 VIEW COMPARISON:  10/09/2016 FINDINGS: Small left and trace right pleural effusions. Mild patchy left lower lobe opacity, likely atelectasis. No Naftula interstitial edema.  No pneumothorax. The heart is normal in size. Postsurgical changes related to prior CABG. Median sternotomy.  Midline skin staples. Thoracic spine is within normal limits. IMPRESSION: Small left and trace right pleural effusions. Electronically Signed   By: Charline Bills M.D.   On:  10/15/2016 13:48   Ct Angio Chest Pe W Or Wo Contrast  Result Date: 10/15/2016 CLINICAL DATA:  Post op 1.5 weeks for open heart surgery. Pt is only complaining of a bit of chest pain where the staples are. No other chest complaints. EXAM: CT ANGIOGRAPHY CHEST WITH CONTRAST TECHNIQUE: Multidetector CT imaging of the chest was performed using the standard protocol during bolus administration of intravenous contrast. Multiplanar CT image reconstructions and MIPs were obtained to evaluate the vascular anatomy. CONTRAST:  100 ml of isovue 370 given. COMPARISON:  06/07/2012 FINDINGS: Cardiovascular: Mild four-chamber cardiac enlargement. Satisfactory opacification of pulmonary arteries noted, and there is no evidence of pulmonary emboli. Previous CABG. Calcified plaque scattered through the thoracic aorta. No dissection, aneurysm, or stenosis. Classic 3 vessel brachiocephalic arterial origin anatomy without proximal stenosis. Visualized proximal abdominal aorta is atheromatous without aneurysm. Mediastinum/Nodes: Small pericardial effusion. No hilar or mediastinal adenopathy. Lungs/Pleura: Small right and moderate left pleural effusions without suggestion of loculation or pleural enhancement. Dependent atelectasis posteriorly in the lower lobes. Lungs otherwise clear. No pneumothorax. Upper Abdomen: No acute findings. Musculoskeletal: Previous median sternotomy. Anterior skin staples. No fracture or  worrisome bone lesion. Review of the MIP images confirms the above findings. IMPRESSION: 1. Pleural effusions, left greater than right. 2. Negative for acute PE or thoracic aortic dissection. 3. Status post median sternotomy and CABG. Electronically Signed   By: Corlis Leak  Hassell M.D.   On: 10/15/2016 21:28        Scheduled Meds: . amiodarone  400 mg Oral Daily  . arformoterol  15 mcg Nebulization BID  . aspirin  325 mg Oral Daily  . atorvastatin  20 mg Oral q1800  . budesonide (PULMICORT) nebulizer solution  0.25 mg  Nebulization BID  . enoxaparin (LOVENOX) injection  30 mg Subcutaneous Q24H  . furosemide  80 mg Intravenous Q12H  . ipratropium  0.5 mg Nebulization QID   And  . levalbuterol  0.63 mg Nebulization QID  . methylPREDNISolone (SOLU-MEDROL) injection  60 mg Intravenous Q6H  . metoprolol tartrate  12.5 mg Oral BID  . nicotine  21 mg Transdermal Daily  . pantoprazole  40 mg Oral Daily  . potassium chloride SA  20 mEq Oral Daily  . sodium chloride flush  3 mL Intravenous Q12H  . tamsulosin  0.4 mg Oral QPM   Continuous Infusions: . sodium chloride    . cefTRIAXone (ROCEPHIN)  IV Stopped (10/15/16 2213)     LOS: 1 day    Maanav Kassabian Jaynie CollinsPrasad Imaad Reuss, MD [facility] Pager [phone]  If 7PM-7AM, please contact night-coverage [email or web address] Password [email or web address] 10/16/2016, 3:47 PM

## 2016-10-16 NOTE — Progress Notes (Signed)
Heart Failure Navigator Consult Note  Presentation: per Dr Dorris Fetch: Devin Barton is a 71 yo man with a history of tobacco abuse and severe COPD (FEV1= 0.75, improves to 0.98 with bronchodilators). Also has unexplained weight loss and BPH. Admitted to [facility] in early June with SOB and Cp and ruled in for MI. Did not have a cardiac workup at that time.  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  CABG x 4 on 7/13.   Postop he had atrial fibrillation which converted with amiodarone. He also had issues with urinary retention.  He was discharged on home O2 on 7/20.  He says he became short of breath last night. Also has noted increased swelling in his legs. He ran out of albuterol at home.   Past Medical History:  Diagnosis Date  . Asthma   . Cigarette smoker   . COPD (chronic obstructive pulmonary disease) (HCC)   . GERD (gastroesophageal reflux disease)     Social History   Social History  . Marital status: Married    Spouse name: N/A  . Number of children: 2  . Years of education: N/A   Social History Main Topics  . Smoking status: Current Every Day Smoker    Packs/day: 1.00    Years: 55.00    Types: Cigarettes  . Smokeless tobacco: Never Used  . Alcohol use No  . Drug use: No  . Sexual activity: Not Currently   Other Topics Concern  . None   Social History Narrative   Lives at home with wife.     ECHO:Study Conclusions--09/20/16  - Left ventricle: The cavity size was normal. Wall thickness was   increased in a pattern of mild LVH. Systolic function was   severely reduced. The estimated ejection fraction was in the   range of 25% to 30%. Abnormal global longitudinal strain of   -11.3%. Diffuse hypokinesis. There is severe hypokinesis of the   anteroseptal and apical myocardium. Doppler parameters are   consistent with abnormal left ventricular relaxation (grade 1   diastolic  dysfunction). - Ventricular septum: Septal motion showed abnormal function and   dyssynergy. - Aortic valve: Mildly calcified annulus. Trileaflet; mildly   calcified leaflets. Valve area (Vmax): 1.89 cm^2. - Mitral valve: Calcified annulus. Mildly thickened leaflets .   There was mild regurgitation. - Right atrium: Central venous pressure (est): 3 mm Hg. - Atrial septum: No defect or patent foramen ovale was identified. - Tricuspid valve: There was trivial regurgitation. - Pulmonary arteries: PA peak pressure: 19 mm Hg (S). - Pericardium, extracardiac: There was no pericardial effusion.  Impressions:  - Mild LVH with LVEF approximately 25-30%. There is diffuse   hypokinesis with septal dyssynergy and hypokinesis of the   anteroseptal and apical myocardium. 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 Grade 1 diastolic dysfunction. No definite LV mural   thrombus but images are somewhat limited, could consider a   Definity contrast study. Mildly calcified mitral annulus with   mildly thickened leaflets and mild mitral regurgitation. Mildly   sclerotic aortic valve. Trivial tricuspid regurgitation with PASP   estimated 19 mmHg.   BNP    Component Value Date/Time   BNP 812.8 (H) 10/15/2016 1332    ProBNP    Component Value Date/Time   PROBNP 161.5 (H) 06/05/2012 2111     Education Assessment and Provision:  Detailed education and instructions provided on heart  failure disease management including the following:  Signs and symptoms of Heart Failure When to call the physician Importance of daily weights Low sodium diet Fluid restriction Medication management Anticipated future follow-up appointments  Patient education given on each of the above topics.  Patient acknowledges understanding and acceptance of all instructions.  I spoke with Devin Barton regarding his HF.  He was recently hospitalized and had CABG.  He is now admitted with HF.  I reviewed HF recommendations for home.   He has a scale at and I reinforced daily weights/ when to contact the physician with worsening symptoms. I also reviewed a low sodium diet and high sodium foods to avoid.  He tells me that he uses no table salt.  We discussed sodium content and limiting to 2000 mg or less daily.  He denies any issues getting or taking prescribed medications.  [facility]  Education Materials:  "Living Better With Heart Failure" Booklet, Daily Weight Tracker Tool .   High Risk Criteria for Readmission and/or Poor Patient Outcomes:   EF <30%- 25% to 30%  2 or more admissions in 6 months- 2/5546mo  Difficult social situation- denies  Demonstrates medication noncompliance- denies    Barriers of Care: Knowledge of HF and compliance  Discharge Planning:   [place]

## 2016-10-16 NOTE — Progress Notes (Signed)
VASCULAR LAB PRELIMINARY  PRELIMINARY  PRELIMINARY  PRELIMINARY  Bilateral lower extremity venous duplex completed.    Preliminary report:  Bilateral - No evidence of DVT or Baker's cyst. There is evidence of a thrombus of the proximal right greater saphenous vein at the level of the saphenofemoral junction probably secondary to having been harvested for CABG. No evidence of a superficial thrombus on the left. Bilateral - No evidence of a Baker's cyst.  Devin Barton, RVS 10/16/2016, 4:04 PM

## 2016-10-16 NOTE — Progress Notes (Signed)
Bladder scan reading greater than 924 ml of urine.  In and out cath done, 1100 ml of urine out.  Will continue to monitor.

## 2016-10-16 NOTE — Consult Note (Signed)
Patient ID: Devin Barton MRN: [id number], DOB/AGE: 10/09/1945   Admit date: 10/15/2016  Reason for Consult:  Acute on Chronic Combined Systolic and Diastolic CHF Requesting Physician: Dr. Isidoro Donning, Internal Medicine    Primary Physician: Assunta Found, MD Primary Cardiologist: Dr. Wyline Mood    Pt. Profile:  Devin Barton is a 71 y.o. 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 Isidoro Donning, Internal Medicine.   Problem List  Past Medical History:  Diagnosis Date  . Asthma   . Cigarette smoker   . COPD (chronic obstructive pulmonary disease) (HCC)   . GERD (gastroesophageal reflux disease)     Past Surgical History:  Procedure Laterality Date  . ABDOMINAL HERNIA REPAIR    . BOWEL RESECTION     Bowel perf secondary to trauma  . CARDIAC CATHETERIZATION    . CORONARY ARTERY BYPASS GRAFT N/A 10/05/2016   Procedure: CORONARY ARTERY BYPASS GRAFTING (CABG) x4 with FREE MAMMARY.  ENDOSCOPIC HARVESTING OF RIGHT SAPHENOUS VEIN. (FREE LIMA to LAD, SVG to OM, SVG SEQUENTIALLY to ACUTE MARGINAL and PDA);  Surgeon: Loreli Slot, MD;  Location: [facility] OR;  Service: Open Heart Surgery;  Laterality: N/A;  . EXPLORATORY LAPAROTOMY  1986   [name] 08/08/2010  . FOOT FRACTURE SURGERY Left ~ 1965  . FRACTURE SURGERY    . HERNIA REPAIR    . INCISIONAL HERNIA REPAIR  05/2005   [name] 08/08/2010  . INGUINAL HERNIA REPAIR Bilateral   . INGUINAL HERNIA REPAIR Right 09/2006   recurrent/notes 07/27/2010  . ORIF FOOT  FRACTURE Left 1990   [name] 08/08/2010  . RIGHT/LEFT HEART CATH AND CORONARY ANGIOGRAPHY N/A 10/04/2016   Procedure: Right/Left Heart Cath and Coronary Angiography;  Surgeon: Corky Crafts, MD;  Location: [facility] INVASIVE CV LAB;  Service: Cardiovascular;  Laterality: N/A;  . TEE WITHOUT CARDIOVERSION N/A 10/05/2016   Procedure: TRANSESOPHAGEAL ECHOCARDIOGRAM (TEE);  Surgeon: Loreli Slot, MD;  Location: [facility] OR;  Service: Open Heart Surgery;  Laterality: N/A;  . VENTRAL HERNIA REPAIR  09/2006   recurrent/notes 07/27/2010     Allergies  Allergies  Allergen Reactions  . No Known Allergies     HPI  Devin Barton is a 71 y.o. 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 Isidoro Donning, Internal Medicine.   Devin Barton was just discharged home by CT Surgery on 10/12/16, following his CABG, and presented back to the [facility] ED on 10/15/16 with complaint of dyspnea and productive cough. Chest CT in the ED was negative for acute PE (LE venous dopplers pending) and dissection, however did show bilateral  pleural effusions, L>R. This was also c/w CXR findings which also mentioned small left and trace right pleural effusions. BNP was abnormal at 812. Initial troponin was 0.15. 2nd and 3rd troponins down trending at 0.04 and 0.03. ED EKG showed NSR with rate of 75 bpm, however he was seen by Dr. Dorris Fetch in the ED and his note mentioned that he was back in afib on telemetry. BMP with stable K at 3.8. Scr WNL at 0.88. CBC with  improving anemia with increase in Hgb to 10.8 (9.9 at time of discharge). However he was noted to have leukocytosis with increase in WBC to 12.4. He was afebrile.    IM was called to admit for acute respiratory failure, due to acute COPD exacerbation and acute on chronic combined systolic and diastolic CHF exacerbation. He has been placed on antibiotics, steroids and bronchodilators for COPD as well as IV Lasix for CHF.  ? If weights are accurate. His weight on discharge summary was 137 lb. Weight today is 129 lb.   He reports that he is feeling a bit better today. He is still requiring supplemental O2. He denies any chest pain and also denies any palpitations. He notes bilateral LEE and 3 pillow orthopnea. He has been eating a low sodium diet at home but admits that he had been drinking a lot of water at home. Has been fully compliant with meds at home. Currently NSR on telemetry.    Home Medications  Prior to Admission medications   Medication Sig Start Date End Date Taking? Authorizing Provider  albuterol (PROAIR HFA) 108 (90 BASE) MCG/ACT inhaler Inhale 2 puffs into the lungs every 6 (six) hours as needed for wheezing or shortness of breath.   Yes [provider]  amiodarone (PACERONE) 400 MG tablet Take 1 tablet (400 mg total) by mouth daily. 10/15/16  Yes Sharlene Dory, PA-C  aspirin EC 325 MG EC tablet Take 1 tablet (325 mg total) by mouth daily. 10/13/16  Yes Asa Lente, Tessa N, PA-C  atorvastatin (LIPITOR) 20 MG tablet Take 1 tablet (20 mg total) by mouth daily at 6 PM. 10/12/16  Yes Asa Lente, Tessa N, PA-C  furosemide (LASIX) 40 MG tablet Take 1 tablet (40 mg total) by mouth daily. 10/13/16  Yes Conte, Tessa N, PA-C  ipratropium (ATROVENT) 0.02 % nebulizer solution Take 0.5 mg by nebulization daily as needed for wheezing or shortness of breath.  08/28/16  Yes [provider]  metoprolol tartrate (LOPRESSOR) 25 MG tablet Take 0.5 tablets (12.5 mg total) by mouth 2 (two) times daily.  10/12/16  Yes Conte, Tessa N, PA-C  omeprazole (PRILOSEC) 40 MG capsule Take 40 mg by mouth daily.   Yes [provider]  potassium chloride SA (K-DUR,KLOR-CON) 20 MEQ tablet Take 1 tablet (20 mEq total) by mouth daily. 10/13/16  Yes Conte, Tessa N, PA-C  tamsulosin (FLOMAX) 0.4 MG CAPS Take 0.4 mg by mouth every evening.   Yes [provider]  traMADol (ULTRAM) 50 MG tablet Take 1 tablet (50 mg total) by mouth every 6 (six) hours as needed for moderate pain. 10/12/16  Yes Conte, Tessa N, PA-C  amiodarone (PACERONE) 400 MG tablet Take 1 tablet (400 mg total) by mouth every 12 (twelve) hours. Patient not taking: Reported on 10/15/2016 10/12/16   Sharlene Dory, [facility] Medications  . amiodarone  400 mg Oral Daily  . arformoterol  15 mcg Nebulization BID  . aspirin  325 mg Oral Daily  .  atorvastatin  20 mg Oral q1800  . budesonide (PULMICORT) nebulizer solution  0.25 mg Nebulization BID  . enoxaparin (LOVENOX) injection  30 mg Subcutaneous Q24H  . furosemide  40 mg Intravenous Q12H  . ipratropium  0.5 mg Nebulization QID   And  . levalbuterol  0.63 mg Nebulization QID  . methylPREDNISolone (SOLU-MEDROL) injection  60 mg Intravenous Q6H  . metoprolol tartrate  12.5 mg Oral BID  . nicotine  21 mg Transdermal Once  . nicotine  21 mg Transdermal Daily  . pantoprazole  40 mg Oral Daily  . potassium chloride SA  20 mEq Oral Daily  . sodium chloride flush  3 mL Intravenous Q12H  . tamsulosin  0.4 mg Oral QPM   . sodium chloride    . cefTRIAXone (ROCEPHIN)  IV Stopped (10/15/16 2213)   sodium chloride, acetaminophen, levalbuterol, ondansetron (ZOFRAN) IV, sodium chloride flush  Family History  Family History  Problem Relation Age of Onset  . Heart disease Mother        No details  . Alzheimer's disease Mother   . Atopy Neg Hx     Social History  Social History   Social History  . Marital status: Married    Spouse name: N/A  . Number of children: 2  .  Years of education: N/A   Occupational History  . Not on file.   Social History Main Topics  . Smoking status: Current Every Day Smoker    Packs/day: 1.00    Years: 55.00    Types: Cigarettes  . Smokeless tobacco: Never Used  . Alcohol use No  . Drug use: No  . Sexual activity: Not Currently   Other Topics Concern  . Not on file   Social History Narrative   Lives at home with wife.      Review of Systems General:  No chills, fever, night sweats or weight changes.  Cardiovascular:  No chest pain, dyspnea on exertion, edema, orthopnea, palpitations, paroxysmal nocturnal dyspnea. Dermatological: No rash, lesions/masses Respiratory: No cough, dyspnea Urologic: No hematuria, dysuria Abdominal:   No nausea, vomiting, diarrhea, bright red blood per rectum, melena, or hematemesis Neurologic:  No visual changes, wkns, changes in mental status. All other systems reviewed and are otherwise negative except as noted above.  Physical Exam  Blood pressure (!) 113/58, pulse 83, temperature 97.9 F (36.6 C), temperature source Oral, resp. rate 18, height 5\' 8"  (1.727 m), weight 129 lb 1.6 oz (58.6 kg), SpO2 94 %.  General: Pleasant, NAD Psych: Normal affect. Neuro: Alert and oriented X 3. Moves all extremities spontaneously. HEENT: Normal  Neck: Supple without bruits or JVD. Lungs:  Decreased BS at the bases with scattered rhonchi, L>R Chest Wall: sternotomy wound dry and stable w/o signs of infection Heart: RRR no s3, s4, or murmurs.  Abdomen: Soft, non-tender, non-distended, BS + x 4.  Extremities: No clubbing, or cyanosis. 3+ bilateral LEE. DP/PT/Radials 2+ and equal bilaterally.  Labs  Troponin [facility] Test)  Recent Labs  10/15/16 1306  TROPIPOC 0.02    Recent Labs  10/15/16 1817 10/16/16 0001 10/16/16 0531  TROPONINI 0.15* 0.04* 0.03*   Lab Results  Component Value Date   WBC 12.4 (H) 10/15/2016   HGB 10.8 (L) 10/15/2016   HCT 32.8 (L) 10/15/2016   MCV  90.6 10/15/2016   PLT 705 (H) 10/15/2016    Recent Labs Lab 10/16/16 0531  NA 132*  K 3.8  CL 92*  CO2 31  BUN 14  CREATININE 0.88  CALCIUM 8.9  GLUCOSE 149*   No results found for: CHOL, HDL, LDLCALC, TRIG No results found for: DDIMER   Radiology/Studies  Dg Chest 2 View  Result Date: 10/15/2016 CLINICAL DATA:  Cough, shortness of breath EXAM: CHEST  2 VIEW COMPARISON:  10/09/2016 FINDINGS: Small left and trace right pleural effusions. Mild patchy left lower lobe opacity, likely atelectasis. No Mabel interstitial edema.  No pneumothorax. The heart is normal in size. Postsurgical changes related to prior CABG. Median sternotomy.  Midline skin staples. Thoracic spine is within normal limits. IMPRESSION: Small left and trace right pleural effusions. Electronically Signed   By: Charline Bills M.D.   On: 10/15/2016 13:48   Dg Chest 2 View  Result Date: 10/09/2016 CLINICAL DATA:  CABG. EXAM: CHEST  2 VIEW COMPARISON:  10/08/2016. FINDINGS: Interim removal right IJ sheath. Prior median sternotomy and CABG. Cardiomegaly with normal pulmonary vascularity. Low lung volumes. Interim improvement of bibasilar infiltrates/edema with mild residual . Tiny bilateral pleural effusions again cannot be excluded. No pneumothorax. IMPRESSION: 1. Interim removal of right IJ sheath. Prior CABG. Stable cardiomegaly. 2. Low lung volumes. Interim improvement of bibasilar infiltrates/edema with mild residual. Tiny bilateral pleural effusions again cannot be excluded. Electronically Signed   By: Maisie Fus  Register   On: 10/09/2016 07:32   Ct Angio Chest Pe W Or Wo Contrast  Result Date: 10/15/2016 CLINICAL DATA:  Post op 1.5 weeks for open heart surgery. Pt is only complaining of a bit of chest pain where the staples are. No other chest complaints. EXAM: CT ANGIOGRAPHY CHEST WITH CONTRAST TECHNIQUE: Multidetector CT imaging of the chest was performed using the standard protocol during bolus administration of  intravenous contrast. Multiplanar CT image reconstructions and MIPs were obtained to evaluate the vascular anatomy. CONTRAST:  100 ml of isovue 370 given. COMPARISON:  06/07/2012 FINDINGS: Cardiovascular: Mild four-chamber cardiac enlargement. Satisfactory opacification of pulmonary arteries noted, and there is no evidence of pulmonary emboli. Previous CABG. Calcified plaque scattered through the thoracic aorta. No dissection, aneurysm, or stenosis. Classic 3 vessel brachiocephalic arterial origin anatomy without proximal stenosis. Visualized proximal abdominal aorta is atheromatous without aneurysm. Mediastinum/Nodes: Small pericardial effusion. No hilar or mediastinal adenopathy. Lungs/Pleura: Small right and moderate left pleural effusions without suggestion of loculation or pleural enhancement. Dependent atelectasis posteriorly in the lower lobes. Lungs otherwise clear. No pneumothorax. Upper Abdomen: No acute findings. Musculoskeletal: Previous median sternotomy. Anterior skin staples. No fracture or worrisome bone lesion. Review of the MIP images confirms the above findings. IMPRESSION: 1. Pleural effusions, left greater than right. 2. Negative for acute PE or thoracic aortic dissection. 3. Status post median sternotomy and CABG. Electronically Signed   By: Corlis Leak M.D.   On: 10/15/2016 21:28   Nm Myocar Multi W/spect W/wall Motion / Ef  Result Date: 09/19/2016  There was no ST segment deviation noted during stress.  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 High risk based on large scar burden and decreased LVEF. There is no significant myocardium currently at jeopardy. Consider correlating LVEF with echo.  The left ventricular ejection fraction is moderately decreased (30-44%).    Dg Chest Port 1 View  Result Date:  10/08/2016 CLINICAL DATA:  Recent chest tube removal. EXAM: PORTABLE CHEST 1 VIEW COMPARISON:  10/07/2016 . FINDINGS: Right IJ sheath in stable position. Left chest tube in  stable position. Very minimal residual left apical pneumothorax. Prior CABG. Heart size stable. Progressive bibasilar atelectasis. Bibasilar infiltrates/edema noted on today's exam. Small bilateral pleural effusions. No pneumothorax. IMPRESSION: 1. Right IJ sheath and left chest tube in stable position. Very minimal residual left apical pneumothorax. 2.  Prior CABG.  Heart size stable. 3. Progressive bibasilar atelectasis. Bibasilar infiltrates/ edema noted on today's exam. Small bilateral pleural effusions. A component of mild CHF may be present . Electronically Signed   By: Maisie Fushomas  Register   On: 10/08/2016 07:16   Dg Chest Port 1 View  Result Date: 10/07/2016 CLINICAL DATA:  Followup recent CABG. EXAM: PORTABLE CHEST 1 VIEW COMPARISON:  Yesterday. FINDINGS: The right jugular Swan-Ganz catheter has been removed and the sheath remains in place. Mediastinal and left chest tubes remain in place. Possible tiny left apical pneumothorax. Mildly increased bibasilar airspace opacity. The cardiac silhouette remains borderline enlarged and post CABG changes are stable. Unremarkable bones. IMPRESSION: 1. Possible tiny left apical pneumothorax. 2. Mildly increased bibasilar atelectasis. Electronically Signed   By: Beckie SaltsSteven  Reid M.D.   On: 10/07/2016 07:45   Dg Chest Port 1 View  Result Date: 10/06/2016 CLINICAL DATA:  Status post CABG surgery. EXAM: PORTABLE CHEST 1 VIEW COMPARISON:  10/05/2016 FINDINGS: Since prior study, the endotracheal tube and the nasogastric tube have been removed. The right internal jugular Swan-Ganz catheter, mediastinal tube and left chest tube remain in place, well positioned. There is opacity at the medial lung bases, mildly increased from the previous day's exam, consistent with atelectasis. Lungs are otherwise clear. No  pneumothorax. Cardiac silhouette is normal in size.  No mediastinal widening. IMPRESSION: 1. No acute findings or evidence of an operative complication. No pulmonary edema, mediastinal widening or pneumothorax. 2. Remaining support apparatus is stable and well positioned. Electronically Signed   By: Amie Portlandavid  Ormond M.D.   On: 10/06/2016 07:30   Dg Chest Port 1 View  Result Date: 10/05/2016 CLINICAL DATA:  S/P CABG x 4 EXAM: PORTABLE CHEST - 1 VIEW COMPARISON:  10/04/2016 FINDINGS: Interval median sternotomy and CABG. Endotracheal tube in place with tip 6.7 cm above carina. Nasogastric tube extends at least as far as the stomach, tip not seen. Left chest tube placed with no pneumothorax evident. Right IJ Swan-Ganz to the proximal pulmonary artery. Patchy subsegmental atelectasis in the left infrahilar region. Lungs are otherwise clear. Heart size and mediastinal contours are within normal limits. Atheromatous aorta. No effusion. IMPRESSION: 1. Interval CABG with support hardware in expected location. 2. No pneumothorax. Electronically Signed   By: Corlis Leak  Hassell M.D.   On: 10/05/2016 13:49   Dg Chest Port 1 View  Result Date: 10/05/2016 CLINICAL DATA:  Chest pain EXAM: PORTABLE CHEST 1 VIEW COMPARISON:  08/24/2016 FINDINGS: Lead or tubing over the central chest, and coiled over the neck and thoracic inlet. No acute infiltrate or effusion. No acute consolidation or pleural effusion. Normal cardiomediastinal silhouette with atherosclerosis. No pneumothorax. Vague opacity in the right upper lobe. IMPRESSION: 1. Vague opacity in the right upper lobe could reflect summation artifact or small focus of atelectasis or inflammation. Radiographic follow-up suggested. 2. Otherwise no radiographic evidence for acute cardiopulmonary abnormality. Electronically Signed   By: Jasmine PangKim  Fujinaga M.D.   On: 10/05/2016 00:11    ECG  NSR 75 bpm-- personally reviewed  Telemetry  NSR  -- personally reviewed    Echocardiogram 09/20/16 Study Conclusions  - Left ventricle: The cavity size was normal. Wall thickness was   increased in a pattern  of mild LVH. Systolic function was   severely reduced. The estimated ejection fraction was in the   range of 25% to 30%. Abnormal global longitudinal strain of   -11.3%. Diffuse hypokinesis. There is severe hypokinesis of the   anteroseptal and apical myocardium. Doppler parameters are   consistent with abnormal left ventricular relaxation (grade 1   diastolic dysfunction). - Ventricular septum: Septal motion showed abnormal function and   dyssynergy. - Aortic valve: Mildly calcified annulus. Trileaflet; mildly   calcified leaflets. Valve area (Vmax): 1.89 cm^2. - Mitral valve: Calcified annulus. Mildly thickened leaflets .   There was mild regurgitation. - Right atrium: Central venous pressure (est): 3 mm Hg. - Atrial septum: No defect or patent foramen ovale was identified. - Tricuspid valve: There was trivial regurgitation. - Pulmonary arteries: PA peak pressure: 19 mm Hg (S). - Pericardium, extracardiac: There was no pericardial effusion.  Impressions:  - Mild LVH with LVEF approximately 25-30%. There is diffuse   hypokinesis with septal dyssynergy and hypokinesis of the   anteroseptal and apical myocardium. 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 Grade 1 diastolic dysfunction. No definite LV mural   thrombus but images are somewhat limited, could consider a   Definity contrast study. Mildly calcified mitral annulus with   mildly thickened leaflets and mild mitral regurgitation. Mildly   sclerotic aortic valve. Trivial tricuspid regurgitation with PASP   estimated 19 mmHg.  ASSESSMENT AND PLAN  1. Acute Respiratory Failure: 2/2 to acute COPDE and acute CHF. PE ruled out by chest CT. See management plans below.  2. Acute on Chronic Combined Systolic and Diastolic HF: 2/2 to ICM/CAD with EF of 25-30%. Grade 1DD was also noted echo. Post  operative PAF also likely contributing. BNP abnormal at 800. Renal function and K both stable. Agree with IV Lasix. He has 3+ bilateral LEE on exam today. Monitor renal function, K, BP and UOP. Strict I/Os and daily weights. Low sodium diet. Monitor input. May need to fluid restrict as pt notes he was drinking a lot of water at home. He is on BB therapy, however he is on Lopressor. Given his LV dysfunction, we will need to change to Toprol XL. Would avoid Coreg given this may further exacerbate his COPD. He would also benefit from ARB/Entresto, however this will be dependent on BP (currently soft).   3. CAD: s/p recent CABG x 4 w/ LIMA to LAD, SVG-OM1, sequential SVG-AM and PDA. He is chest pain free. Continue ASA and BB. He is on low dose statin, Lipitor 20 mg. No updated lipid profile in Epic. We will obtain FLP in the am. Up Lipitor if LDL not < 70 mg/DL.   4. Post Operative PAF: was discharged home on PO amiodarone, 400 mg BID, with instructions given to reduce down to 400 mg once daily on 10/15/16. ED EKG and telemetry currently show NSR, however it was outlined in Dr. Sunday Corn consult note that the patient was in afib when he examined him. He denies any palpitations. Monitor on telemetry and continue amiodarone. If he proves to have frequent recurrence, may need to address OAC.   5. HTN: stable on current regimen. Monitor.   6. Acute COPD Exacerbation: management per IM. Agree with Xopenx given PAF.   7. Tobacco Abuse: Nicoderm CQ ordered by primary team to aid with smoking cessation.    Signed, Robbie Lis, PA-C, [facility] 10/16/2016, 10:52 AM [facility] Pager: [phone]

## 2016-10-17 ENCOUNTER — Telehealth: Payer: Self-pay | Admitting: Cardiology

## 2016-10-17 DIAGNOSIS — I5043 Acute on chronic combined systolic (congestive) and diastolic (congestive) heart failure: Secondary | ICD-10-CM

## 2016-10-17 DIAGNOSIS — I480 Paroxysmal atrial fibrillation: Secondary | ICD-10-CM

## 2016-10-17 DIAGNOSIS — Z951 Presence of aortocoronary bypass graft: Secondary | ICD-10-CM

## 2016-10-17 LAB — BASIC METABOLIC PANEL
Anion gap: 7 (ref 5–15)
BUN: 18 mg/dL (ref 6–20)
CHLORIDE: 94 mmol/L — AB (ref 101–111)
CO2: 34 mmol/L — AB (ref 22–32)
CREATININE: 0.84 mg/dL (ref 0.61–1.24)
Calcium: 8.9 mg/dL (ref 8.9–10.3)
GFR calc Af Amer: >60 mL/min (ref >60)
GFR calc non Af Amer: >60 mL/min (ref >60)
GLUCOSE: 122 mg/dL — AB (ref 65–99)
POTASSIUM: 3.8 mmol/L (ref 3.5–5.1)
Sodium: 135 mmol/L (ref 135–145)

## 2016-10-17 MED ORDER — ZOLPIDEM TARTRATE 5 MG PO TABS: 5.0000 mg | ORAL_TABLET | Freq: Every evening | ORAL | Status: DC | PRN

## 2016-10-17 MED ORDER — ATORVASTATIN CALCIUM 40 MG PO TABS
40.0000 mg | ORAL_TABLET | Freq: Every day | ORAL | Status: DC
Start: 2016-10-17 — End: 2016-10-20
  Administered 2016-10-17 – 2016-10-19 (×3): 40 mg via ORAL
  Filled 2016-10-17 (×3): qty 1

## 2016-10-17 MED ORDER — TRAZODONE HCL 50 MG PO TABS
50.0000 mg | ORAL_TABLET | Freq: Every day | ORAL | Status: DC
  Administered 2016-10-17 – 2016-10-19 (×3): 50 mg via ORAL
  Filled 2016-10-17 (×3): qty 1

## 2016-10-17 NOTE — Progress Notes (Signed)
Progress Note  Patient Name: Devin Barton Date of Encounter: 10/17/2016  Primary Cardiologist: Dr. Wyline MoodBranch  Subjective   Pt states breathing is improving. Wishes to discharge home.  Inpatient Medications    Scheduled Meds: . amiodarone  400 mg Oral Daily  . arformoterol  15 mcg Nebulization BID  . aspirin  325 mg Oral Daily  . atorvastatin  20 mg Oral q1800  . budesonide (PULMICORT) nebulizer solution  0.25 mg Nebulization BID  . enoxaparin (LOVENOX) injection  30 mg Subcutaneous Q24H  . furosemide  80 mg Intravenous Q12H  . ipratropium  0.5 mg Nebulization QID   And  . levalbuterol  0.63 mg Nebulization QID  . metoprolol tartrate  12.5 mg Oral BID  . nicotine  21 mg Transdermal Daily  . pantoprazole  40 mg Oral Daily  . potassium chloride SA  20 mEq Oral Daily  . sodium chloride flush  3 mL Intravenous Q12H  . tamsulosin  0.4 mg Oral QPM   Continuous Infusions: . sodium chloride     PRN Meds: sodium chloride, acetaminophen, levalbuterol, ondansetron (ZOFRAN) IV, sodium chloride flush   Vital Signs    Vitals:   10/16/16 1948 10/17/16 0154 10/17/16 0612 10/17/16 0732  BP: (!) 122/53 (!) 114/57 109/62   Pulse: 74 69 72 65  Resp: 18 19  18   Temp: 98 F (36.7 C) 97.9 F (36.6 C) 97.7 F (36.5 C)   TempSrc: Oral Oral    SpO2:  98% 99% 97%  Weight:   128 lb 8 oz (58.3 kg)   Height:        Intake/Output Summary (Last 24 hours) at 10/17/16 0758 Last data filed at 10/17/16 0754  Gross per 24 hour  Intake              720 ml  Output             2275 ml  Net            -1555 ml   Filed Weights   10/15/16 1717 10/16/16 0614 10/17/16 0612  Weight: 119 lb 11.2 oz (54.3 kg) 129 lb 1.6 oz (58.6 kg) 128 lb 8 oz (58.3 kg)     Physical Exam   General: Well developed, well nourished, male appearing in no acute distress. Head: Normocephalic, atraumatic.  Neck: Supple without bruits, + JVD. Lungs:  Resp regular and unlabored, coarse sounds throughout, shallow  breaths Heart: RRR, S1, S2, no murmur; no rub. Sternal incision with staples in place Abdomen: Soft, non-tender, non-distended with normoactive bowel sounds. No hepatomegaly. No rebound/guarding. No obvious abdominal masses. Extremities: No clubbing, cyanosis, 1+ - 2+ LE edema. Distal pedal pulses are 1+ bilaterally. Neuro: Alert and oriented X 3. Moves all extremities spontaneously. Psych: Normal affect.  Labs    Chemistry Recent Labs Lab 10/15/16 1258 10/16/16 0531 10/17/16 0520  NA 131* 132* 135  K 4.3 3.8 3.8  CL 94* 92* 94*  CO2 29 31 34*  GLUCOSE 133* 149* 122*  BUN 11 14 18   CREATININE 0.83 0.88 0.84  CALCIUM 8.8* 8.9 8.9  GFRNONAA >60 >60 >60  GFRAA >60 >60 >60  ANIONGAP 8 9 7      Hematology Recent Labs Lab 10/15/16 1258  WBC 12.4*  RBC 3.62*  HGB 10.8*  HCT 32.8*  MCV 90.6  MCH 29.8  MCHC 32.9  RDW 14.9  PLT 705*    Cardiac Enzymes Recent Labs Lab 10/15/16 1817 10/16/16 0001 10/16/16 0531  TROPONINI  0.15* 0.04* 0.03*    Recent Labs Lab 10/15/16 1306  TROPIPOC 0.02     BNP Recent Labs Lab 10/15/16 1332  BNP 812.8*     DDimer No results for input(s): DDIMER in the last 168 hours.   Radiology    Dg Chest 2 View  Result Date: 10/15/2016 CLINICAL DATA:  Cough, shortness of breath EXAM: CHEST  2 VIEW COMPARISON:  10/09/2016 FINDINGS: Small left and trace right pleural effusions. Mild patchy left lower lobe opacity, likely atelectasis. No Poseidon interstitial edema.  No pneumothorax. The heart is normal in size. Postsurgical changes related to prior CABG. Median sternotomy.  Midline skin staples. Thoracic spine is within normal limits. IMPRESSION: Small left and trace right pleural effusions. Electronically Signed   By: Charline BillsSriyesh  Krishnan M.D.   On: 10/15/2016 13:48   Ct Angio Chest Pe W Or Wo Contrast  Result Date: 10/15/2016 CLINICAL DATA:  Post op 1.5 weeks for open heart surgery. Pt is only complaining of a bit of chest pain where the  staples are. No other chest complaints. EXAM: CT ANGIOGRAPHY CHEST WITH CONTRAST TECHNIQUE: Multidetector CT imaging of the chest was performed using the standard protocol during bolus administration of intravenous contrast. Multiplanar CT image reconstructions and MIPs were obtained to evaluate the vascular anatomy. CONTRAST:  100 ml of isovue 370 given. COMPARISON:  06/07/2012 FINDINGS: Cardiovascular: Mild four-chamber cardiac enlargement. Satisfactory opacification of pulmonary arteries noted, and there is no evidence of pulmonary emboli. Previous CABG. Calcified plaque scattered through the thoracic aorta. No dissection, aneurysm, or stenosis. Classic 3 vessel brachiocephalic arterial origin anatomy without proximal stenosis. Visualized proximal abdominal aorta is atheromatous without aneurysm. Mediastinum/Nodes: Small pericardial effusion. No hilar or mediastinal adenopathy. Lungs/Pleura: Small right and moderate left pleural effusions without suggestion of loculation or pleural enhancement. Dependent atelectasis posteriorly in the lower lobes. Lungs otherwise clear. No pneumothorax. Upper Abdomen: No acute findings. Musculoskeletal: Previous median sternotomy. Anterior skin staples. No fracture or worrisome bone lesion. Review of the MIP images confirms the above findings. IMPRESSION: 1. Pleural effusions, left greater than right. 2. Negative for acute PE or thoracic aortic dissection. 3. Status post median sternotomy and CABG. Electronically Signed   By: Corlis Leak  Hassell M.D.   On: 10/15/2016 21:28     Telemetry    Sinus rhythm - Personally Reviewed  ECG    No new tracings - Personally Reviewed   Cardiac Studies   TEE 10/05/16  Left ventricle: Normal left ventricular diastolic function and left atrial pressure. Cavity is mildly dilated. Thin-walled ventricle. LV systolic function is moderately reduced with an EF of 35-40%. Wall motion is abnormal. Mid, anteroseptal wall motion is  dyskinetic.  Septum: Abnormal ventricular septal motion consistent with post-operative state. Small membranous ventricular septal defect present with left to right shunting.  Aortic valve: The valve is trileaflet. Mild valve thickening present. Mild valve calcification present. Trace regurgitation.  Mitral valve: Mild regurgitation.  Tricuspid valve: Mild regurgitation. The tricuspid valve regurgitation jet is central.   Echocardiogram 09/20/16 Study Conclusions - Left ventricle: The cavity size was normal. Wall thickness was increased in a pattern of mild LVH. Systolic function was severely reduced. The estimated ejection fraction was in the range of 25% to 30%. Abnormal global longitudinal strain of -11.3%. Diffuse hypokinesis. There is severe hypokinesis of the anteroseptal and apical myocardium. Doppler parameters are consistent with abnormal left ventricular relaxation (grade 1 diastolic dysfunction). - Ventricular septum: Septal motion showed abnormal function and dyssynergy. - Aortic valve: Mildly calcified  annulus. Trileaflet; mildly calcified leaflets. Valve area (Vmax): 1.89 cm^2. - Mitral valve: Calcified annulus. Mildly thickened leaflets . There was mild regurgitation. - Right atrium: Central venous pressure (est): 3 mm Hg. - Atrial septum: No defect or patent foramen ovale was identified. - Tricuspid valve: There was trivial regurgitation. - Pulmonary arteries: PA peak pressure: 19 mm Hg (S). - Pericardium, extracardiac: There was no pericardial effusion.  Impressions: - Mild LVH with LVEF approximately 25-30%. There is diffuse hypokinesis with septal dyssynergy and hypokinesis of the anteroseptal and apical myocardium. 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 Grade 1 diastolic dysfunction. No definite LV mural thrombus but images are somewhat limited, could consider a Definity contrast study. Mildly calcified mitral annulus with mildly  thickened leaflets and mild mitral regurgitation. Mildly sclerotic aortic valve. Trivial tricuspid regurgitation with PASP estimated 19 mmHg.  Patient Profile     71 y.o. 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   Assessment & Plan    1. Acute respiratory failure, acute COPD exacerbation - respiratory failure is likely secondary to heart failure exacerbation - per primary team, D/C'ed solumedrol and ABX   2. Acute on chronic systolic and diastolic heart failure  - diuresing on 80 mg IV lasix BID - he is overall net negative 2 L with 2 L urine output yesterday - weight is 128 lbs, question admission weight accuracy documented as 119 lbs - continue lopressor 12.5 mg BID   3. CAD s/p CABG x 4 (LIMA to LAD, SVG-OM1, sequential SVG-AM and PDA) - troponin 0.15 --> 0.04 --> 0.03: not consistent with an ischemic pattern, likely secondary to CHF exacerbation and recent CABG - pt denies chest pain - continue ASA, increase lipitor to 40 mg   4. Post-operative PAF - on amiodarone 400 mg daily, consider transitioning to 200 mg daily - not on anticoagulation at this time, in NSR on telemetry - he denies palpitations, but was unaware of his Afib post-op   5. HTN - well-controlled   5. Tobacco abuse - discussed smoking cessation   Signed, Marcelino Duster , PA-C 7:58 AM 10/17/2016 Pager: [phone]

## 2016-10-17 NOTE — Progress Notes (Addendum)
Pts family informed of aspiration precautions as well as speech eval pending.

## 2016-10-17 NOTE — Progress Notes (Signed)
Pt reports "pain in the chest" to secretary via callbell. Upon entering the room pt is hiccuping, states it is causing discomfort at midline chest incision; per pt's daughter he always has hiccups when on steroids, and that he is only complaining of discomfort when he hiccups.   Pt confirms this is a correct description of his discomfort, denies chest pain/sob at rest (between hiccups).

## 2016-10-17 NOTE — Telephone Encounter (Signed)
Received a call from Estelle JuneJeri Boehne (wife of Homero FellersFrank) she states that she received a telephone call today from Dr. Wyline MoodBranch. [facility].  Will forward to Dr. Wyline MoodBranch .  Home # [phone]

## 2016-10-17 NOTE — Evaluation (Signed)
Physical Therapy Evaluation Patient Details Name: Devin CostainFrank L Altschuler MRN: [id number] DOB: 11-Apr-1945 Today's Date: 10/17/2016   History of Present Illness  Patient is a 71 yo male admitted 10/15/16 with increasing SOB.  Patient with acute resp failure due to CHF.     PMH:  CAD, s/p CABG x4 on 10/05/16, CHF, COPD, PAF, HTN  Clinical Impression  Patient is functioning at Mod I to supervision level with all mobility and gait.  Good balance during gait.  No further acute PT needs identified - PT will sign off.  Encouraged patient to ambulate in hallway with nursing.    Follow Up Recommendations No PT follow up;Supervision for mobility/OOB    Equipment Recommendations  None recommended by PT    Recommendations for Other Services       Precautions / Restrictions Precautions Precautions: Sternal Restrictions Weight Bearing Restrictions: No Other Position/Activity Restrictions: Sternal precautions      Mobility  Bed Mobility               General bed mobility comments: Patient in chair  Transfers Overall transfer level: Modified independent Equipment used: None                Ambulation/Gait Ambulation/Gait assistance: Supervision Ambulation Distance (Feet): 90 Feet Assistive device: None Gait Pattern/deviations: Step-through pattern;Decreased stride length Gait velocity: decreased Gait velocity interpretation: Below normal speed for age/gender General Gait Details: Patient with good gait pattern and balance.  Stairs            Wheelchair Mobility    Modified Rankin (Stroke Patients Only)       Balance Overall balance assessment: No apparent balance deficits (not formally assessed)                                           Pertinent Vitals/Pain Pain Assessment: Faces Faces Pain Scale: Hurts a little bit Pain Location: chest wall with cough, sacral area in sitting Pain Descriptors / Indicators: Sore Pain Intervention(s): Monitored  during session    Home Living Family/patient expects to be discharged to:: Private residence Living Arrangements: Spouse/significant other Available Help at Discharge: Family;Available 24 hours/day Type of Home: House Home Access: Stairs to enter Entrance Stairs-Rails: None Entrance Stairs-Number of Steps: 3 Home Layout: One level Home Equipment: None      Prior Function Level of Independence: Independent (Prior to CABG)               Hand Dominance        Extremity/Trunk Assessment   Upper Extremity Assessment Upper Extremity Assessment: Overall WFL for tasks assessed (Sternal precautions reviewed including limiting ROM)    Lower Extremity Assessment Lower Extremity Assessment: Overall WFL for tasks assessed       Communication   Communication: No difficulties  Cognition Arousal/Alertness: Awake/alert Behavior During Therapy: WFL for tasks assessed/performed Overall Cognitive Status: Within Functional Limits for tasks assessed                                        General Comments      Exercises     Assessment/Plan    PT Assessment Patent does not need any further PT services  PT Problem List         PT Treatment Interventions  PT Goals (Current goals can be found in the Care Plan section)  Acute Rehab PT Goals PT Goal Formulation: All assessment and education complete, DC therapy    Frequency     Barriers to discharge        Co-evaluation               AM-PAC PT "6 Clicks" Daily Activity  Outcome Measure Difficulty turning over in bed (including adjusting bedclothes, sheets and blankets)?: None Difficulty moving from lying on back to sitting on the side of the bed? : A Little Difficulty sitting down on and standing up from a chair with arms (e.g., wheelchair, bedside commode, etc,.)?: None Help needed moving to and from a bed to chair (including a wheelchair)?: None Help needed walking in hospital room?:  None Help needed climbing 3-5 steps with a railing? : A Little 6 Click Score: 22    End of Session Equipment Utilized During Treatment: Oxygen Activity Tolerance: Patient tolerated treatment well Patient left: in chair;with call bell/phone within reach;with nursing/sitter in room Nurse Communication: Mobility status (No PT needs identified. Encouraged ambulation with nursing) PT Visit Diagnosis: Pain Pain - part of body:  (chest wall and sacral)    Time: [id number] PT Time Calculation (min) (ACUTE ONLY): 14 min   Charges:   PT Evaluation $PT Eval Low Complexity: 1 Procedure     PT G Codes:        Durenda HurtSusan H. Renaldo Fiddleravis, PT, [facility] HospitalMBA Acute Rehab Services Pager [phone]   Vena AustriaSusan H Soni Kegel 10/17/2016, 7:22 PM

## 2016-10-17 NOTE — Evaluation (Signed)
Clinical/Bedside Swallow Evaluation Patient Details  Name: Devin CostainFrank L Colgate MRN: [id number] Date of Birth: 03/12/1946  Today's Date: 10/17/2016 Time: SLP Start Time (ACUTE ONLY): 1430 SLP Stop Time (ACUTE ONLY): 1445 SLP Time Calculation (min) (ACUTE ONLY): 15 min  Past Medical History:  Past Medical History:  Diagnosis Date  . Asthma   . Cigarette smoker   . COPD (chronic obstructive pulmonary disease) (HCC)   . GERD (gastroesophageal reflux disease)    Past Surgical History:  Past Surgical History:  Procedure Laterality Date  . ABDOMINAL HERNIA REPAIR    . BOWEL RESECTION     Bowel perf secondary to trauma  . CARDIAC CATHETERIZATION    . CORONARY ARTERY BYPASS GRAFT N/A 10/05/2016   Procedure: CORONARY ARTERY BYPASS GRAFTING (CABG) x4 with FREE MAMMARY.  ENDOSCOPIC HARVESTING OF RIGHT SAPHENOUS VEIN. (FREE LIMA to LAD, SVG to OM, SVG SEQUENTIALLY to ACUTE MARGINAL and PDA);  Surgeon: Loreli SlotHendrickson, Steven C, MD;  [facility];  Service: Open Heart Surgery;  Laterality: N/A;  . EXPLORATORY LAPAROTOMY  1986   [name]/notes 08/08/2010  . FOOT FRACTURE SURGERY Left ~ 1965  . FRACTURE SURGERY    . HERNIA REPAIR    . INCISIONAL HERNIA REPAIR  05/2005   [name]/notes 08/08/2010  . INGUINAL HERNIA REPAIR Bilateral   . INGUINAL HERNIA REPAIR Right 09/2006   recurrent/notes 07/27/2010  . ORIF FOOT FRACTURE Left 1990   [name]/notes 08/08/2010  . RIGHT/LEFT HEART CATH AND CORONARY ANGIOGRAPHY N/A 10/04/2016   Procedure: Right/Left Heart Cath and Coronary Angiography;  Surgeon: Corky CraftsVaranasi, Jayadeep S, MD;  [facility];  Service: Cardiovascular;  Laterality: N/A;  . TEE WITHOUT CARDIOVERSION N/A 10/05/2016   Procedure: TRANSESOPHAGEAL ECHOCARDIOGRAM (TEE);  Surgeon: Loreli SlotHendrickson, Steven C, MD;  [facility];  Service: Open Heart Surgery;  Laterality: N/A;  . VENTRAL HERNIA REPAIR  09/2006   recurrent/notes 07/27/2010   HPI:  Pt is a 71 y.o. male with PMH of nicotine abuse (>50pack years), COPD, CAD  s/p CABG on 10/05/16, combined systolic and diastolic CHF echo with EF 25-30% with grade 1 diastolic dysfunction, HTN who was just discharged on 7/21 from CT surgery service after CABG. Patient re-admitted 7/23, states since he has been home, he has been having more dyspnea, cough, congestion with brownish phlegm. Patient has orthopnea, sitting up upright and worsening LE edema. CXR showed small left and trace right pleural effusions. Bedside swallow eval ordered due to "cough when eating".    Assessment / Plan / Recommendation Clinical Impression  Pt showed consistent, immediate overt s/s of aspiration (cough) following trials of thin liquid by straw. When attempting small sips of thin liquids without straw, pt coughed for 1/4 trials. Suspect that difficulties may be resulting from difficulties with swallow/breathing coordination. Pt endorses symptoms and reported noticing frequent coughing during meals for the past 6 months. Recommend continuing regular diet/ thin liquids with no straws, meds whole with puree (pt reported coughing most frequent when taking pills), intermittent supervision to ensure pt seated upright and taking small bites/ sips. Also recommend proceeding with MBS to objectively evaluate swallow function- plan for MBS next date. SLP Visit Diagnosis: Dysphagia, unspecified (R13.10)    Aspiration Risk  Moderate aspiration risk    Diet Recommendation Regular;Thin liquid   Liquid Administration via: Cup;No straw Medication Administration: Whole meds with puree Supervision: Patient able to self feed;Intermittent supervision to cue for compensatory strategies Compensations: Slow rate;Small sips/bites Postural Changes: Seated upright at 90 degrees;Remain upright for at least  30 minutes after po intake    Other  Recommendations Oral Care Recommendations: Oral care BID   Follow up Recommendations  (TBD)      Frequency and Duration            Prognosis        Swallow Study    General HPI: Pt is a 71 y.o. 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 Patient re-admitted 7/23, states since he has been home, he has been having more dyspnea, cough, congestion with brownish phlegm. Patient has orthopnea, sitting up upright and worsening LE edema. CXR showed small left and trace right pleural effusions. Bedside swallow eval ordered due to "cough when eating".  Type of Study: Bedside Swallow Evaluation Previous Swallow Assessment:  (none in chart) Diet Prior to this Study: Regular;Thin liquids Temperature Spikes Noted: No Respiratory Status: Nasal cannula History of Recent Intubation: No Behavior/Cognition: Alert;Cooperative;Pleasant mood Oral Cavity Assessment: Within Functional Limits Oral Cavity - Dentition: Edentulous Vision: Functional for self-feeding Self-Feeding Abilities: Able to feed self Patient Positioning: Upright in chair Baseline Vocal Quality: Hoarse Volitional Cough: Strong Volitional Swallow: Able to elicit    Oral/Motor/Sensory Function Overall Oral Motor/Sensory Function: Within functional limits   Ice Chips Ice chips: Not tested   Thin Liquid Thin Liquid: Impaired Presentation: Cup;Straw Pharyngeal  Phase Impairments: Cough - Immediate    Nectar Thick Nectar Thick Liquid: Not tested   Honey Thick Honey Thick Liquid: Not tested   Puree Puree: Within functional limits   Solid   GO   Solid: Within functional limits        Amy Cecille AverK Oleksi[place], MA, CCC-SLP 10/17/2016,2:50 PM [phone]

## 2016-10-17 NOTE — Progress Notes (Signed)
PROGRESS NOTE    Devin Barton  ZOX:[id number] DOB: 1945/04/10 DOA: 10/15/2016 PCP: Assunta Found, MD   Brief Narrative: 70 year old male with nicotine abuse (>50pack years), COPD, CAD s/p CABG on 10/05/16, combined systolic and diastolic CHF echo with EF 25-30% with grade 1 diastolic dysfunction, HTN who was just discharged on 7/21 from CT surgery service after CABG. patient presented with dyspnea on exertion, cough, congestion and worsening lower extremity edema consistent with CHF exacerbation.  Assessment & Plan:  #  Acute on chronic combined systolic and diastolic congestive heart failure: -Patient reported feeling  better today with IV diuretics. Currently he is on Lasix 80 mg twice a day. Echo done on July 13 consistent with EF of 35-40%.  Continue low-salt diet, instructed ins and outs, daily weight. -Continue metoprolol. -cardiology consulted, input appreciated  #Acute respiratory failure with hypoxia in the setting of CHF exacerbation. Currently on 2 L of oxygen. Try to wean down gradually. He report he was discharged home on oxygen from recent hospitalization.  #History of COPD: I think patient's symptoms are mainly contributed by congestive heart failure. Do not think patient has acute exacerbation therefore  Solu-Medrol discontinued. Continue bronchodilators and nebulizers and treatment. Patient follows up with pulmonologist outpatient. -Discontinue ceftriaxone. Chest x-ray with no pneumonia. -doppler LE negative for DVT, CTA negative for PE. -patient report chronic cough, it seems cough is related when he eats, speech eval ordered.  #post op Paroxysmal atrial fibrillation:  He is started on amiodarone loading, he is continued on metoprolol. On aspirin 325. Follow up with cardiology to evaluate if patient needs anticoagulation.  #Coronary artery disease status post recent CABG: Cardiothoracic surgeon following. Continue current cardiac medication. The chest wound is healing and  has  Staples. -Continue aspirin, Lipitor -Mild elevation in troponin likely due to CHF and recent CABG.  #GERD: Continue Protonix  #BPH: Continue Flomax  Insomnia: prn ambien   DVT prophylaxis: Lovenox subcutaneous Code Status: Full code Family Communication: Discussed with the patient's wife at bedside Disposition Plan: need clearance from cardiology, pending PT OT evaluation    Consultants:   Cardiology  Cardio thoracic surgeon  Procedures: None Antimicrobials: Discontinue ceftriaxone  Subjective: Patient is sitting in chair, report feeling better, less edema, some chest pain when having hiccups, he reported chronic cough No fever, he is started on oxygen since last hospitalization Edema is improving, R>l He reports chronic insomnia  Wife at bedside   Chronic cough Edema r.>l Lung clear 02  Objective: Vitals:   10/17/16 0154 10/17/16 0612 10/17/16 0732 10/17/16 0943  BP: (!) 114/57 109/62  125/68  Pulse: 69 72 65 71  Resp: 19  18   Temp: 97.9 F (36.6 C) 97.7 F (36.5 C)    TempSrc: Oral     SpO2: 98% 99% 97%   Weight:  58.3 kg (128 lb 8 oz)    Height:        Intake/Output Summary (Last 24 hours) at 10/17/16 1248 Last data filed at 10/17/16 0943  Gross per 24 hour  Intake             1083 ml  Output             2000 ml  Net             -917 ml   Filed Weights   10/15/16 1717 10/16/16 0614 10/17/16 0612  Weight: 54.3 kg (119 lb 11.2 oz) 58.6 kg (129 lb 1.6 oz) 58.3 kg (128 lb  8 oz)    Examination:  General exam: frail, chronically ill, NAD, Appears calm and comfortable  Respiratory system: Bibasal crackles, respiratory effort normal. No wheezing Cardiovascular system: S1 & S2 heard, RRR.  Bilateral lower extremities pitting edema. Gastrointestinal system: Abdomen is nondistended, soft and nontender. Normal bowel sounds heard. Central nervous system: Alert and oriented. No focal neurological deficits. Extremities: bilateral lower extremity  edema, right > left Skin: No rashes, lesions or ulcers Psychiatry: Judgement and insight appear normal. Mood & affect appropriate.     Data Reviewed: I have personally reviewed following labs and imaging studies  CBC:  Recent Labs Lab 10/15/16 1258  WBC 12.4*  HGB 10.8*  HCT 32.8*  MCV 90.6  PLT 705*   Basic Metabolic Panel:  Recent Labs Lab 10/15/16 1258 10/16/16 0531 10/17/16 0520  NA 131* 132* 135  K 4.3 3.8 3.8  CL 94* 92* 94*  CO2 29 31 34*  GLUCOSE 133* 149* 122*  BUN 11 14 18   CREATININE 0.83 0.88 0.84  CALCIUM 8.8* 8.9 8.9   GFR: Estimated Creatinine Clearance: 66.5 mL/min (by C-G formula based on SCr of 0.84 mg/dL). Liver Function Tests: No results for input(s): AST, ALT, ALKPHOS, BILITOT, PROT, ALBUMIN in the last 168 hours. No results for input(s): LIPASE, AMYLASE in the last 168 hours. No results for input(s): AMMONIA in the last 168 hours. Coagulation Profile: No results for input(s): INR, PROTIME in the last 168 hours. Cardiac Enzymes:  Recent Labs Lab 10/15/16 1817 10/16/16 0001 10/16/16 0531  TROPONINI 0.15* 0.04* 0.03*   BNP (last 3 results) No results for input(s): PROBNP in the last 8760 hours. HbA1C: No results for input(s): HGBA1C in the last 72 hours. CBG:  Recent Labs Lab 10/10/16 1532 10/10/16 2040 10/10/16 2337 10/11/16 0400  GLUCAP 101* 122* 106* 96   Lipid Profile: No results for input(s): CHOL, HDL, LDLCALC, TRIG, CHOLHDL, LDLDIRECT in the last 72 hours. Thyroid Function Tests: No results for input(s): TSH, T4TOTAL, FREET4, T3FREE, THYROIDAB in the last 72 hours. Anemia Panel: No results for input(s): VITAMINB12, FOLATE, FERRITIN, TIBC, IRON, RETICCTPCT in the last 72 hours. Sepsis Labs: No results for input(s): PROCALCITON, LATICACIDVEN in the last 168 hours.  No results found for this or any previous visit (from the past 240 hour(s)).       Radiology Studies: Dg Chest 2 View  Result Date:  10/15/2016 CLINICAL DATA:  Cough, shortness of breath EXAM: CHEST  2 VIEW COMPARISON:  10/09/2016 FINDINGS: Small left and trace right pleural effusions. Mild patchy left lower lobe opacity, likely atelectasis. No Mekel interstitial edema.  No pneumothorax. The heart is normal in size. Postsurgical changes related to prior CABG. Median sternotomy.  Midline skin staples. Thoracic spine is within normal limits. IMPRESSION: Small left and trace right pleural effusions. Electronically Signed   By: Charline BillsSriyesh  Krishnan M.D.   On: 10/15/2016 13:48   Ct Angio Chest Pe W Or Wo Contrast  Result Date: 10/15/2016 CLINICAL DATA:  Post op 1.5 weeks for open heart surgery. Pt is only complaining of a bit of chest pain where the staples are. No other chest complaints. EXAM: CT ANGIOGRAPHY CHEST WITH CONTRAST TECHNIQUE: Multidetector CT imaging of the chest was performed using the standard protocol during bolus administration of intravenous contrast. Multiplanar CT image reconstructions and MIPs were obtained to evaluate the vascular anatomy. CONTRAST:  100 ml of isovue 370 given. COMPARISON:  06/07/2012 FINDINGS: Cardiovascular: Mild four-chamber cardiac enlargement. Satisfactory opacification of pulmonary arteries noted, and there  is no evidence of pulmonary emboli. Previous CABG. Calcified plaque scattered through the thoracic aorta. No dissection, aneurysm, or stenosis. Classic 3 vessel brachiocephalic arterial origin anatomy without proximal stenosis. Visualized proximal abdominal aorta is atheromatous without aneurysm. Mediastinum/Nodes: Small pericardial effusion. No hilar or mediastinal adenopathy. Lungs/Pleura: Small right and moderate left pleural effusions without suggestion of loculation or pleural enhancement. Dependent atelectasis posteriorly in the lower lobes. Lungs otherwise clear. No pneumothorax. Upper Abdomen: No acute findings. Musculoskeletal: Previous median sternotomy. Anterior skin staples. No fracture or  worrisome bone lesion. Review of the MIP images confirms the above findings. IMPRESSION: 1. Pleural effusions, left greater than right. 2. Negative for acute PE or thoracic aortic dissection. 3. Status post median sternotomy and CABG. Electronically Signed   By: Corlis Leak  Hassell M.D.   On: 10/15/2016 21:28        Scheduled Meds: . amiodarone  400 mg Oral Daily  . arformoterol  15 mcg Nebulization BID  . aspirin  325 mg Oral Daily  . atorvastatin  40 mg Oral q1800  . budesonide (PULMICORT) nebulizer solution  0.25 mg Nebulization BID  . enoxaparin (LOVENOX) injection  30 mg Subcutaneous Q24H  . furosemide  80 mg Intravenous Q12H  . ipratropium  0.5 mg Nebulization QID   And  . levalbuterol  0.63 mg Nebulization QID  . metoprolol tartrate  12.5 mg Oral BID  . nicotine  21 mg Transdermal Daily  . pantoprazole  40 mg Oral Daily  . potassium chloride SA  20 mEq Oral Daily  . sodium chloride flush  3 mL Intravenous Q12H  . tamsulosin  0.4 mg Oral QPM   Continuous Infusions: . sodium chloride       LOS: 2 days    Audreyanna Butkiewicz, MD PhD [facility] Pager [phone]  If 7PM-7AM, please contact night-coverage [email or web address] Password [email or web address] 10/17/2016, 12:48 PM

## 2016-10-17 NOTE — Progress Notes (Signed)
      [address]       [place] [id number]             [phone]      Feels better today   Not short of breath. Some incisional pain with hiccups  BP 125/68 (BP Location: Right Arm)   Pulse 71   Temp 97.7 F (36.5 C)   Resp 18   Ht 5\' 8"  (1.727 m)   Wt 128 lb 8 oz (58.3 kg) Comment: a scale  SpO2 95%   BMI 19.54 kg/m    Intake/Output Summary (Last 24 hours) at 10/17/16 1641 Last data filed at 10/17/16 0943  Gross per 24 hour  Intake              963 ml  Output             1750 ml  Net             -787 ml   Edema improved  No DVT or PE by imaging  Improved with diuresis. Maintaining SR on amiodarone currently  Viviann SpareSteven C. Dorris FetchHendrickson, MD [facility] [phone]

## 2016-10-18 ENCOUNTER — Inpatient Hospital Stay (HOSPITAL_COMMUNITY): Payer: Medicare HMO

## 2016-10-18 DIAGNOSIS — R1310 Dysphagia, unspecified: Secondary | ICD-10-CM

## 2016-10-18 DIAGNOSIS — J9600 Acute respiratory failure, unspecified whether with hypoxia or hypercapnia: Secondary | ICD-10-CM

## 2016-10-18 DIAGNOSIS — J90 Pleural effusion, not elsewhere classified: Secondary | ICD-10-CM

## 2016-10-18 LAB — T4, FREE: FREE T4: 1.19 ng/dL — AB (ref 0.61–1.12)

## 2016-10-18 LAB — BASIC METABOLIC PANEL
Anion gap: 8 (ref 5–15)
BUN: 23 mg/dL — ABNORMAL HIGH (ref 6–20)
CHLORIDE: 93 mmol/L — AB (ref 101–111)
CO2: 34 mmol/L — ABNORMAL HIGH (ref 22–32)
CREATININE: 1.06 mg/dL (ref 0.61–1.24)
Calcium: 8.9 mg/dL (ref 8.9–10.3)
Glucose, Bld: 91 mg/dL (ref 65–99)
POTASSIUM: 4.1 mmol/L (ref 3.5–5.1)
SODIUM: 135 mmol/L (ref 135–145)

## 2016-10-18 LAB — CBC
HCT: 33.6 % — ABNORMAL LOW (ref 39.0–52.0)
Hemoglobin: 10.7 g/dL — ABNORMAL LOW (ref 13.0–17.0)
MCH: 28.9 pg (ref 26.0–34.0)
MCHC: 31.8 g/dL (ref 30.0–36.0)
MCV: 90.8 fL (ref 78.0–100.0)
PLATELETS: 778 10*3/uL — AB (ref 150–400)
RBC: 3.7 MIL/uL — AB (ref 4.22–5.81)
RDW: 15 % (ref 11.5–15.5)
WBC: 14.4 10*3/uL — AB (ref 4.0–10.5)

## 2016-10-18 LAB — TSH: TSH: 3.666 u[IU]/mL (ref 0.350–4.500)

## 2016-10-18 LAB — MAGNESIUM: Magnesium: 2.2 mg/dL (ref 1.7–2.4)

## 2016-10-18 MED ORDER — RESOURCE THICKENUP CLEAR PO POWD
ORAL | Status: DC | PRN
  Filled 2016-10-18: qty 125

## 2016-10-18 MED ORDER — RESOURCE THICKENUP CLEAR PO POWD
ORAL | Status: AC
  Administered 2016-10-18: 12:00:00 via ORAL
  Filled 2016-10-18: qty 125

## 2016-10-18 MED ORDER — LEVALBUTEROL HCL 0.63 MG/3ML IN NEBU
0.6300 mg | INHALATION_SOLUTION | Freq: Four times a day (QID) | RESPIRATORY_TRACT | Status: DC | PRN
  Administered 2016-10-18 – 2016-10-20 (×3): 0.63 mg via RESPIRATORY_TRACT
  Filled 2016-10-18 (×3): qty 3

## 2016-10-18 MED ORDER — IPRATROPIUM BROMIDE 0.02 % IN SOLN
0.5000 mg | Freq: Four times a day (QID) | RESPIRATORY_TRACT | Status: DC | PRN
  Administered 2016-10-20: 0.5 mg via RESPIRATORY_TRACT
  Filled 2016-10-18: qty 2.5

## 2016-10-18 MED ORDER — FUROSEMIDE 80 MG PO TABS
80.0000 mg | ORAL_TABLET | Freq: Every day | ORAL | Status: DC
  Administered 2016-10-19 – 2016-10-20 (×2): 80 mg via ORAL
  Filled 2016-10-18 (×2): qty 1

## 2016-10-18 NOTE — Progress Notes (Addendum)
Notified MD of pain upon urination and lower ab pressure.  Bladder scanned for 694 ml.  Orders received  0341- in and out cathed patient per protocol for 1000 ml clear, light, yellow urine, no odor noted.  Patient states he takes flomax and has an enlarged prostate.

## 2016-10-18 NOTE — Progress Notes (Addendum)
Modified Barium Swallow Progress Note  Patient Details  Name: Devin CostainFrank L Molock MRN: [id number] Date of Birth: 07-07-1945  Today's Date: 10/18/2016  Modified Barium Swallow completed.  Full report located under Chart Review in the Imaging Section.  Brief recommendations include the following:  Clinical Impression  Pt with a moderate-severe pharyngoesophageal dysphagia, likely acute-on-chronic. Pharyngeal phase characterized by premature spillage to the level of the pyriform sinuses, reduced hyolaryngeal excursion and base of tongue retraction along with reduced epiglottic inversion/ airway closure resulting in Lamarr aspiration of thin liquids before the swallow, along with significant residuals at the UES, pyriform sinuses, and vallecula. These residuals were noted across consistencies. Pt also with significantly reduced opening of the UES and the appearance of significant narrowing of the upper esophagus. Of the compensatory strategies attempted, a head turn to the right side was beneficial in opening of the UES for clearance of material to the esophagus (chin tuck ineffective, pt unable to complete Mendelsohn maneuver). Pt did have one instance of trace aspiration with nectar-thick liquids with post-swallow residuals. Pt is at an increased risk of aspiration at this time. Recommend downgrading diet to dysphagia 2/ nectar-thick liquids, meds crushed in puree. Also use the following strategies: head turn to the R side, effortful swallow, multiple swallows, intermittent throat clear/ cough. SLP will continue to follow for compensatory strategy review along with exercises for hyolaryngeal excursion/ base of tongue movement. Pt may also benefit from further esophageal assessment.   Swallow Evaluation Recommendations   Recommended Consults: Consider esophageal assessment   SLP Diet Recommendations: Dysphagia 2 (Fine chop) solids;Nectar thick liquid   Liquid Administration via: Cup;Straw   Medication  Administration: Crushed with puree   Supervision: Patient able to self feed;Intermittent supervision to cue for compensatory strategies   Compensations: Slow rate;Small sips/bites;Multiple dry swallows after each bite/sip;Clear throat intermittently;Effortful swallow;Other (Comment) (head turn to right side)   Postural Changes: Remain semi-upright after after feeds/meals (Comment);Seated upright at 90 degrees   Oral Care Recommendations: Oral care BID   Other Recommendations: Order thickener from pharmacy;Clarify dietary restrictions    Nyra Anspaugh Cecille AverK Gottfried Standish, MA, CCC-SLP 10/18/2016,10:32 AM  [phone]

## 2016-10-18 NOTE — Progress Notes (Signed)
PROGRESS NOTE    Devin Barton  ZOX:[id number] DOB: 1946/02/09 DOA: 10/15/2016 PCP: Assunta Found, MD   Brief Narrative: 71 year old male with nicotine abuse (>50pack years), COPD, CAD s/p CABG on 10/05/16, combined systolic and diastolic CHF echo with EF 25-30% with grade 1 diastolic dysfunction, HTN who was just discharged on 7/21 from CT surgery service after CABG. patient presented with dyspnea on exertion, cough, congestion and worsening lower extremity edema consistent with CHF exacerbation.  Assessment & Plan:   Acute on chronic combined systolic and diastolic congestive heart failure: -Echo done on July 13 consistent with EF of 35-40%.  cxr on admission with small bilateral pleural effusions,  --cardiology consulted, meds adjustment per cardiology, he is improving, currently on lopressor/lasix -continue low-salt diet, instructed ins and outs, daily weight.   Acute respiratory failure with hypoxia in the setting of CHF exacerbation. He report he was discharged home on oxygen from recent hospitalization. -doppler LE negative for DVT, CTA + bilateral pleural effusions, no pneumonia, negative for PE. -Currently on 2 L of oxygen. Try to wean down gradually.    post op Paroxysmal atrial fibrillation:  He is started on amiodarone loading, he is continued on metoprolol. On aspirin 325 per cardiology recommendation. He is in sinus rhythm currently  Coronary artery disease status post recent CABG (on 10/05/2016):  Cardiothoracic surgeon following. Continue current cardiac medication. The chest wound is healing and has  Staples. -Continue aspirin, Lipitor -Mild elevation in troponin likely due to CHF and recent CABG.   History of COPD:  patient's symptoms are likely mainly contributed by congestive heart failure. Do not think patient has acute exacerbation. Chest x-ray with no pneumonia .therefore  Solu-Medrol  And ceftriaxone discontinued.  -Continue bronchodilators and nebulizers and  treatment. Patient follows up with pulmonologist outpatient. -doppler LE negative for DVT, CTA negative for PE. -patient report chronic cough, it seems cough is related when he eats, speech eval ordered.  Dysphagia:  Appreciate speech input, diet modified DG esophagus ordered Wife requested nutrition consult ordered per wife's request Body mass index is 18.43 kg/m.   GERD: Continue Protonix  BPH: Continue Flomax  Chronic Insomnia: prn ambien   FTT: Pt eval " Patient is functioning at Mod I to supervision level with all mobility and gait.  Good balance during gait.  No further acute PT needs identified - PT will sign off.  Encouraged patient to ambulate in hallway with nursing."  DVT prophylaxis: Lovenox subcutaneous Code Status: Full code Family Communication: Discussed with the patient's wife at bedside Disposition Plan: likely home with home health on 7/27 , need clearance from cardiology,     Consultants:   Cardiology  Cardio thoracic surgeon  Procedures: None Antimicrobials: Discontinue ceftriaxone  Subjective: Patient is sitting in chair, Edema is improving, R>l, report DOE is improving, he continue to have chronic cough No fever, he is started on oxygen since last hospitalization Wife at bedside  Objective: Vitals:   10/17/16 2058 10/17/16 2152 10/18/16 0447 10/18/16 0743  BP: 135/68  (!) 110/59   Pulse: 82  64   Resp: 16  16   Temp: 98.5 F (36.9 C)  98.1 F (36.7 C)   TempSrc: Oral  Oral   SpO2: 99% 98% 100% 98%  Weight:   55 kg (121 lb 3.2 oz)   Height:        Intake/Output Summary (Last 24 hours) at 10/18/16 0757 Last data filed at 10/18/16 0749  Gross per 24 hour  Intake  923 ml  Output             1825 ml  Net             -902 ml   Filed Weights   10/16/16 0614 10/17/16 0612 10/18/16 0447  Weight: 58.6 kg (129 lb 1.6 oz) 58.3 kg (128 lb 8 oz) 55 kg (121 lb 3.2 oz)    Examination:  General exam: frail, chronically ill, NAD,  Appears calm and comfortable  Respiratory system: Bibasal crackles improving, respiratory effort normal. No wheezing Cardiovascular system: S1 & S2 heard, RRR.  Bilateral lower extremities pitting edema. Gastrointestinal system: Abdomen is nondistended, soft and nontender. Normal bowel sounds heard. Central nervous system: Alert and oriented. No focal neurological deficits. Extremities: bilateral lower extremity edema improving, right > left Skin: No rashes, lesions or ulcers Psychiatry: Judgement and insight appear normal. Mood & affect appropriate.     Data Reviewed: I have personally reviewed following labs and imaging studies  CBC:  Recent Labs Lab 10/15/16 1258 10/18/16 0449  WBC 12.4* 14.4*  HGB 10.8* 10.7*  HCT 32.8* 33.6*  MCV 90.6 90.8  PLT 705* 778*   Basic Metabolic Panel:  Recent Labs Lab 10/15/16 1258 10/16/16 0531 10/17/16 0520 10/18/16 0449  NA 131* 132* 135 135  K 4.3 3.8 3.8 4.1  CL 94* 92* 94* 93*  CO2 29 31 34* 34*  GLUCOSE 133* 149* 122* 91  BUN 11 14 18  23*  CREATININE 0.83 0.88 0.84 1.06  CALCIUM 8.8* 8.9 8.9 8.9  MG  --   --   --  2.2   GFR: Estimated Creatinine Clearance: 49.7 mL/min (by C-G formula based on SCr of 1.06 mg/dL). Liver Function Tests: No results for input(s): AST, ALT, ALKPHOS, BILITOT, PROT, ALBUMIN in the last 168 hours. No results for input(s): LIPASE, AMYLASE in the last 168 hours. No results for input(s): AMMONIA in the last 168 hours. Coagulation Profile: No results for input(s): INR, PROTIME in the last 168 hours. Cardiac Enzymes:  Recent Labs Lab 10/15/16 1817 10/16/16 0001 10/16/16 0531  TROPONINI 0.15* 0.04* 0.03*   BNP (last 3 results) No results for input(s): PROBNP in the last 8760 hours. HbA1C: No results for input(s): HGBA1C in the last 72 hours. CBG: No results for input(s): GLUCAP in the last 168 hours. Lipid Profile: No results for input(s): CHOL, HDL, LDLCALC, TRIG, CHOLHDL, LDLDIRECT in the  last 72 hours. Thyroid Function Tests:  Recent Labs  10/18/16 0449  TSH 3.666  FREET4 1.19*   Anemia Panel: No results for input(s): VITAMINB12, FOLATE, FERRITIN, TIBC, IRON, RETICCTPCT in the last 72 hours. Sepsis Labs: No results for input(s): PROCALCITON, LATICACIDVEN in the last 168 hours.  No results found for this or any previous visit (from the past 240 hour(s)).       Radiology Studies: No results found.      Scheduled Meds: . amiodarone  400 mg Oral Daily  . arformoterol  15 mcg Nebulization BID  . aspirin  325 mg Oral Daily  . atorvastatin  40 mg Oral q1800  . budesonide (PULMICORT) nebulizer solution  0.25 mg Nebulization BID  . enoxaparin (LOVENOX) injection  30 mg Subcutaneous Q24H  . furosemide  80 mg Intravenous Q12H  . ipratropium  0.5 mg Nebulization QID   And  . levalbuterol  0.63 mg Nebulization QID  . metoprolol tartrate  12.5 mg Oral BID  . nicotine  21 mg Transdermal Daily  . pantoprazole  40 mg Oral  Daily  . potassium chloride SA  20 mEq Oral Daily  . sodium chloride flush  3 mL Intravenous Q12H  . tamsulosin  0.4 mg Oral QPM  . traZODone  50 mg Oral QHS   Continuous Infusions: . sodium chloride       LOS: 3 days   Time spent> 35mins  Kinzee Happel, MD PhD [facility] Pager [phone]  If 7PM-7AM, please contact night-coverage [email or web address] Password [email or web address] 10/18/2016, 7:57 AM

## 2016-10-18 NOTE — Progress Notes (Signed)
Progress Note  Patient Name: Devin Barton Date of Encounter: 10/18/2016  Primary Cardiologist: Dr. Wyline MoodBranch  Subjective   Feeling much better.  Denies chest pain or shortness of breath  Inpatient Medications    Scheduled Meds: . amiodarone  400 mg Oral Daily  . arformoterol  15 mcg Nebulization BID  . aspirin  325 mg Oral Daily  . atorvastatin  40 mg Oral q1800  . budesonide (PULMICORT) nebulizer solution  0.25 mg Nebulization BID  . enoxaparin (LOVENOX) injection  30 mg Subcutaneous Q24H  . metoprolol tartrate  12.5 mg Oral BID  . nicotine  21 mg Transdermal Daily  . pantoprazole  40 mg Oral Daily  . potassium chloride SA  20 mEq Oral Daily  . sodium chloride flush  3 mL Intravenous Q12H  . tamsulosin  0.4 mg Oral QPM  . traZODone  50 mg Oral QHS   Continuous Infusions: . sodium chloride     PRN Meds: sodium chloride, acetaminophen, levalbuterol **AND** ipratropium, ondansetron (ZOFRAN) IV, sodium chloride flush, zolpidem   Vital Signs    Vitals:   10/17/16 2058 10/17/16 2152 10/18/16 0447 10/18/16 0743  BP: 135/68  (!) 110/59   Pulse: 82  64   Resp: 16  16   Temp: 98.5 F (36.9 C)  98.1 F (36.7 C)   TempSrc: Oral  Oral   SpO2: 99% 98% 100% 98%  Weight:   55 kg (121 lb 3.2 oz)   Height:        Intake/Output Summary (Last 24 hours) at 10/18/16 1030 Last data filed at 10/18/16 0910  Gross per 24 hour  Intake              680 ml  Output             2145 ml  Net            -1465 ml   Filed Weights   10/16/16 0614 10/17/16 0612 10/18/16 0447  Weight: 58.6 kg (129 lb 1.6 oz) 58.3 kg (128 lb 8 oz) 55 kg (121 lb 3.2 oz)    Telemetry    Sinus rhythm.  No events.  - Personally Reviewed  ECG    n/a - Personally Reviewed  Physical Exam   GEN: No acute distress.   Neck: JVP 1 cm above clavicle sitting upright.  Chest: Healing median sternotomy.  Staples in place. Cardiac: RRR, no murmurs, rubs, or gallops.  Respiratory: Cl ear to auscultation  bilaterally. GI: Soft, nontender, non-distended  MS: No edema; No deformity. Neuro:  Nonfocal  Psych: Normal affect   Labs    Chemistry Recent Labs Lab 10/16/16 0531 10/17/16 0520 10/18/16 0449  NA 132* 135 135  K 3.8 3.8 4.1  CL 92* 94* 93*  CO2 31 34* 34*  GLUCOSE 149* 122* 91  BUN 14 18 23*  CREATININE 0.88 0.84 1.06  CALCIUM 8.9 8.9 8.9  GFRNONAA >60 >60 >60  GFRAA >60 >60 >60  ANIONGAP 9 7 8      Hematology Recent Labs Lab 10/15/16 1258 10/18/16 0449  WBC 12.4* 14.4*  RBC 3.62* 3.70*  HGB 10.8* 10.7*  HCT 32.8* 33.6*  MCV 90.6 90.8  MCH 29.8 28.9  MCHC 32.9 31.8  RDW 14.9 15.0  PLT 705* 778*    Cardiac Enzymes Recent Labs Lab 10/15/16 1817 10/16/16 0001 10/16/16 0531  TROPONINI 0.15* 0.04* 0.03*    Recent Labs Lab 10/15/16 1306  TROPIPOC 0.02     BNP Recent  Labs Lab 10/15/16 1332  BNP 812.8*     DDimer No results for input(s): DDIMER in the last 168 hours.   Radiology    No results found.  Cardiac Studies   TEE 10/05/16:  Left ventricle: Normal left ventricular diastolic function and left atrial pressure. Cavity is mildly dilated. Thin-walled ventricle. LV systolic function is moderately reduced with an EF of 35-40%. Wall motion is abnormal. Mid, anteroseptal wall motion is dyskinetic.  Septum: Abnormal ventricular septal motion consistent with post-operative state. Small membranous ventricular septal defect present with left to right shunting.  Aortic valve: The valve is trileaflet. Mild valve thickening present. Mild valve calcification present. Trace regurgitation.  Mitral valve: Mild regurgitation.  Tricuspid valve: Mild regurgitation. The tricuspid valve regurgitation jet is central.  Patient Profile     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  Assessment & Plan    # Acute on chronic systolic and diastolic function:  Volume status continues to improve.  He now has 1+ LE edema and lungs are clear on exam.  Renal function is starting to worsen.  He received one dose of IV lasix this AM.  We will switch lasix to 80mg  po daily starting tomorrow.  Weight today was recorded at 121 lb.  There seems to be some discrepancy because he was only net -1.2L by I/Os.    # CAD s/p CABG: Healing well and stable.  Continue aspirin, metoprolol, and atorvastatin.    # Post-op atrial fibrillation: Remains in sinus rhythm.  Continue metoprolol.  He will continue on amiodarone 400mg  daily through 4/28.  At that time he will have completed 5g load and can transition to 200mg  daily.  Duration of therapy to be determined at CT surgery appointment.  Continue aspirin.  Signed, Chilton Siiffany [place], MD  10/18/2016, 10:30 AM

## 2016-10-19 ENCOUNTER — Inpatient Hospital Stay (HOSPITAL_COMMUNITY): Payer: Medicare HMO

## 2016-10-19 DIAGNOSIS — I2510 Atherosclerotic heart disease of native coronary artery without angina pectoris: Secondary | ICD-10-CM

## 2016-10-19 DIAGNOSIS — E43 Unspecified severe protein-calorie malnutrition: Secondary | ICD-10-CM

## 2016-10-19 LAB — BASIC METABOLIC PANEL
ANION GAP: 7 (ref 5–15)
BUN: 26 mg/dL — ABNORMAL HIGH (ref 6–20)
CALCIUM: 8.9 mg/dL (ref 8.9–10.3)
CHLORIDE: 96 mmol/L — AB (ref 101–111)
CO2: 32 mmol/L (ref 22–32)
CREATININE: 1.1 mg/dL (ref 0.61–1.24)
GFR calc Af Amer: >60 mL/min (ref >60)
Glucose, Bld: 104 mg/dL — ABNORMAL HIGH (ref 65–99)
Potassium: 3.7 mmol/L (ref 3.5–5.1)
Sodium: 135 mmol/L (ref 135–145)

## 2016-10-19 MED ORDER — RESOURCE THICKENUP CLEAR PO POWD: ORAL | 0 refills | Status: DC

## 2016-10-19 MED ORDER — AMIODARONE HCL 400 MG PO TABS: 400.0000 mg | ORAL_TABLET | Freq: Every day | ORAL | 0 refills | Status: DC

## 2016-10-19 MED ORDER — ENSURE ENLIVE PO LIQD
237.0000 mL | Freq: Two times a day (BID) | ORAL | Status: DC
  Administered 2016-10-19 – 2016-10-20 (×2): 237 mL via ORAL

## 2016-10-19 MED ORDER — ARFORMOTEROL TARTRATE 15 MCG/2ML IN NEBU: 15.0000 ug | INHALATION_SOLUTION | Freq: Two times a day (BID) | RESPIRATORY_TRACT | 0 refills | Status: DC

## 2016-10-19 MED ORDER — ATORVASTATIN CALCIUM 40 MG PO TABS: 40.0000 mg | ORAL_TABLET | Freq: Every day | ORAL | 0 refills | Status: DC

## 2016-10-19 MED ORDER — AMIODARONE HCL 200 MG PO TABS: 200.0000 mg | ORAL_TABLET | Freq: Every day | ORAL | 0 refills | Status: DC

## 2016-10-19 MED ORDER — BUDESONIDE 0.25 MG/2ML IN SUSP: 0.2500 mg | Freq: Two times a day (BID) | RESPIRATORY_TRACT | 12 refills | Status: DC

## 2016-10-19 MED ORDER — FUROSEMIDE 40 MG PO TABS: 80.0000 mg | ORAL_TABLET | Freq: Every day | ORAL | 0 refills | Status: DC

## 2016-10-19 MED ORDER — LEVALBUTEROL HCL 0.63 MG/3ML IN NEBU: 0.6300 mg | INHALATION_SOLUTION | Freq: Four times a day (QID) | RESPIRATORY_TRACT | 12 refills | Status: DC | PRN

## 2016-10-19 NOTE — Progress Notes (Signed)
      [address]       [place] [id number]             [phone]      Feels better, wants to go home  BP (!) 116/55 (BP Location: Right Arm)   Pulse 63   Temp 98 F (36.7 C) (Oral)   Resp 19   Ht 5\' 8"  (1.727 m)   Wt 116 lb 9.6 oz (52.9 kg)   SpO2 96%   BMI 17.73 kg/m    Intake/Output Summary (Last 24 hours) at 10/19/16 0844 Last data filed at 10/19/16 0600  Gross per 24 hour  Intake              480 ml  Output             1470 ml  Net             -990 ml   Incisions healing well Lungs diminished but no rales or wheezing  Noted to have aspiration on swallow study- on DII diet  Will remove remaining chest staples and chest tube sutures Home when OK with Hospitalists  Lavone Barrientes C. Dorris FetchHendrickson, MD [facility] [phone]

## 2016-10-19 NOTE — Progress Notes (Signed)
Progress Note  Patient Name: Devin Barton Date of Encounter: 10/19/2016  Primary Cardiologist: Dr. Wyline Mood  Subjective   Feeling much better.  Denies chest pain or shortness of breath  Inpatient Medications    Scheduled Meds: . amiodarone  400 mg Oral Daily  . arformoterol  15 mcg Nebulization BID  . aspirin  325 mg Oral Daily  . atorvastatin  40 mg Oral q1800  . budesonide (PULMICORT) nebulizer solution  0.25 mg Nebulization BID  . enoxaparin (LOVENOX) injection  30 mg Subcutaneous Q24H  . furosemide  80 mg Oral Daily  . metoprolol tartrate  12.5 mg Oral BID  . nicotine  21 mg Transdermal Daily  . pantoprazole  40 mg Oral Daily  . potassium chloride SA  20 mEq Oral Daily  . sodium chloride flush  3 mL Intravenous Q12H  . tamsulosin  0.4 mg Oral QPM  . traZODone  50 mg Oral QHS   Continuous Infusions: . sodium chloride     PRN Meds: sodium chloride, acetaminophen, levalbuterol **AND** ipratropium, ondansetron (ZOFRAN) IV, RESOURCE THICKENUP CLEAR, sodium chloride flush, zolpidem   Vital Signs    Vitals:   10/18/16 1214 10/18/16 2030 10/19/16 0113 10/19/16 0436  BP: (!) 101/59 (!) 96/49  (!) 116/55  Pulse: 84 76  63  Resp: 18 18  19   Temp: 98 F (36.7 C) 98.4 F (36.9 C)  98 F (36.7 C)  TempSrc: Oral Oral  Oral  SpO2: 98% 97%  96%  Weight:   116 lb 9.6 oz (52.9 kg)   Height:        Intake/Output Summary (Last 24 hours) at 10/19/16 0919 Last data filed at 10/19/16 0600  Gross per 24 hour  Intake              240 ml  Output             1150 ml  Net             -910 ml   Filed Weights   10/17/16 0612 10/18/16 0447 10/19/16 0113  Weight: 128 lb 8 oz (58.3 kg) 121 lb 3.2 oz (55 kg) 116 lb 9.6 oz (52.9 kg)    Telemetry    Sinus rhythm. PACs, No events.  - Personally Reviewed  ECG    n/a - Personally Reviewed  Physical Exam   GEN: No acute distress.   Neck: JVP 1 cm above clavicle sitting upright.  Chest: Healing median sternotomy.  Staples in  place. Cardiac: RRR, no murmurs, rubs, or gallops.  Respiratory: Clear to auscultation bilaterally. GI: Soft, nontender, non-distended  MS: No edema; No deformity. RLE wound is without drainage, +ecchymosis Neuro:  Nonfocal  Psych: Normal affect   Labs    Chemistry  Recent Labs Lab 10/17/16 0520 10/18/16 0449 10/19/16 0330  NA 135 135 135  K 3.8 4.1 3.7  CL 94* 93* 96*  CO2 34* 34* 32  GLUCOSE 122* 91 104*  BUN 18 23* 26*  CREATININE 0.84 1.06 1.10  CALCIUM 8.9 8.9 8.9  GFRNONAA >60 >60 >60  GFRAA >60 >60 >60  ANIONGAP 7 8 7      Hematology  Recent Labs Lab 10/15/16 1258 10/18/16 0449  WBC 12.4* 14.4*  RBC 3.62* 3.70*  HGB 10.8* 10.7*  HCT 32.8* 33.6*  MCV 90.6 90.8  MCH 29.8 28.9  MCHC 32.9 31.8  RDW 14.9 15.0  PLT 705* 778*    Cardiac Enzymes  Recent Labs Lab 10/15/16 1817 10/16/16  0001 10/16/16 0531  TROPONINI 0.15* 0.04* 0.03*     Recent Labs Lab 10/15/16 1306  TROPIPOC 0.02     BNP  Recent Labs Lab 10/15/16 1332  BNP 812.8*     Radiology    Dg Swallowing Func-speech Pathology  Result Date: 10/18/2016  Patient re-admitted 7/23, states since he has been home, he has been having more dyspnea, cough, congestion with brownish phlegm. Patient has orthopnea, sitting up upright and worsening LE edema. CXR showed small left and trace right pleural effusions. Bedside swallow eval ordered due to "cough when eating".  No Data Recorded Assessment / Plan / Recommendation  CHL IP CLINICAL IMPRESSIONS 10/18/2016 Clinical Impression Recommend downgrading diet to dysphagia 2/ nectar-thick liquids, meds crushed in puree. Also use the following strategies: head turn to the R side, effortful swallow, multiple swallows. SLP will continue to follow for compensatory strategy review along with exercises for hyolaryngeal excursion/ base of tongue movement; also consider a repeat MBS before d/c. Pt may also benefit from further esophageal assessment. SLP Visit  Diagnosis Dysphagia, pharyngoesophageal phase (R13.14) Attention and concentration deficit following -- Frontal lobe and executive function deficit following -- Impact on safety and function Moderate aspiration risk   CHL IP TREATMENT RECOMMENDATION 10/18/2016 Treatment Recommendations Therapy as outlined in treatment plan below   Prognosis 10/18/2016 Prognosis for Safe Diet Advancement Fair Barriers to Reach Goals Severity of deficits Barriers/Prognosis Comment -- CHL IP DIET RECOMMENDATION 10/18/2016 SLP Diet Recommendations Dysphagia 2 (Fine chop) solids;Nectar thick liquid Liquid Administration via Cup;Straw Medication Administration Crushed with puree Compensations Slow rate;Small sips/bites;Multiple dry swallows after each bite/sip;Clear throat intermittently;Effortful swallow;Other (Comment) Postural Changes Remain semi-upright after after feeds/meals (Comment);Seated upright at 90 degrees   CHL IP OTHER RECOMMENDATIONS 10/18/2016 Recommended Consults Consider esophageal assessment Oral Care Recommendations Oral care BID Other Recommendations Order thickener from pharmacy;Clarify dietary restrictions   CHL IP FOLLOW UP RECOMMENDATIONS 10/18/2016 Follow up Recommendations Home health SLP   CHL IP FREQUENCY AND DURATION 10/18/2016 Speech Therapy Frequency (ACUTE ONLY) min 3x week Treatment Duration 2 weeks      CHL IP ORAL PHASE 10/18/2016 Oral Phase WFL Oral - Pudding Teaspoon -- Oral - Pudding Cup -- Oral - Honey Teaspoon -- Oral - Honey Cup -- Oral - Nectar Teaspoon -- Oral - Nectar Cup -- Oral - Nectar Straw -- Oral - Thin Teaspoon -- Oral - Thin Cup -- Oral - Thin Straw -- Oral - Puree -- Oral - Mech Soft -- Oral - Regular -- Oral - Multi-Consistency -- Oral - Pill -- Oral Phase - Comment --  CHL IP PHARYNGEAL PHASE 10/18/2016 Pharyngeal Phase Impaired Pharyngeal- Pudding Teaspoon -- Pharyngeal -- Pharyngeal- Pudding Cup -- Pharyngeal -- Pharyngeal- Honey Teaspoon -- Pharyngeal -- Pharyngeal- Honey Cup --  Pharyngeal -- Pharyngeal- Nectar Teaspoon -- Pharyngeal -- Pharyngeal- Nectar Cup Delayed swallow initiation-pyriform sinuses;Reduced epiglottic inversion;Reduced anterior laryngeal mobility;Reduced laryngeal elevation;Reduced airway/laryngeal closure;Reduced tongue base retraction;Pharyngeal residue - valleculae;Pharyngeal residue - pyriform;Pharyngeal residue - cp segment Pharyngeal -- Pharyngeal- Nectar Straw Delayed swallow initiation-pyriform sinuses;Reduced epiglottic inversion;Reduced anterior laryngeal mobility;Reduced laryngeal elevation;Reduced airway/laryngeal closure;Reduced tongue base retraction;Penetration/Apiration after swallow;Trace aspiration;Pharyngeal residue - valleculae;Pharyngeal residue - pyriform;Pharyngeal residue - cp segment Pharyngeal Material enters airway, passes BELOW cords without attempt by patient to eject out (silent aspiration) Pharyngeal- Thin Teaspoon -- Pharyngeal -- Pharyngeal- Thin Cup Delayed swallow initiation-pyriform sinuses;Reduced epiglottic inversion;Reduced anterior laryngeal mobility;Reduced laryngeal elevation;Reduced airway/laryngeal closure;Reduced tongue base retraction;Penetration/Aspiration before swallow;Moderate aspiration;Pharyngeal residue - valleculae;Pharyngeal residue - pyriform;Pharyngeal residue - cp segment;Compensatory strategies attempted (  with notebox) Pharyngeal Material enters airway, passes BELOW cords and not ejected out despite cough attempt by patient Pharyngeal- Thin Straw -- Pharyngeal -- Pharyngeal- Puree Delayed swallow initiation-vallecula;Reduced epiglottic inversion;Reduced anterior laryngeal mobility;Reduced laryngeal elevation;Reduced tongue base retraction;Pharyngeal residue - valleculae;Pharyngeal residue - pyriform;Pharyngeal residue - cp segment;Compensatory strategies attempted (with notebox) Pharyngeal -- Pharyngeal- Mechanical Soft -- Pharyngeal -- Pharyngeal- Regular Delayed swallow initiation-vallecula;Reduced epiglottic  inversion;Reduced anterior laryngeal mobility;Reduced laryngeal elevation;Reduced tongue base retraction;Pharyngeal residue - valleculae;Pharyngeal residue - pyriform;Pharyngeal residue - cp segment;Compensatory strategies attempted (with notebox) Pharyngeal -- Pharyngeal- Multi-consistency -- Pharyngeal -- Pharyngeal- Pill -- Pharyngeal -- Pharyngeal Comment head turn to R- somewhat effective in clearing residuals  CHL IP CERVICAL ESOPHAGEAL PHASE 10/18/2016 Cervical Esophageal Phase Impaired Pudding Teaspoon -- Pudding Cup -- Honey Teaspoon -- Honey Cup -- Nectar Teaspoon -- Nectar Cup Reduced cricopharyngeal relaxation Nectar Straw Reduced cricopharyngeal relaxation Thin Teaspoon -- Thin Cup Reduced cricopharyngeal relaxation Thin Straw -- Puree Reduced cricopharyngeal relaxation Mechanical Soft -- Regular Reduced cricopharyngeal relaxation Multi-consistency -- Pill -- Cervical Esophageal Comment -- No flowsheet data found. Amy Cecille AverK [place], MA, CCC-SLP 10/18/2016, 10:27 AM [phone]              Cardiac Studies   TEE 10/05/16:  Left ventricle: Normal left ventricular diastolic function and left atrial pressure. Cavity is mildly dilated. Thin-walled ventricle. LV systolic function is moderately reduced with an EF of 35-40%. Wall motion is abnormal. Mid, anteroseptal wall motion is dyskinetic.  Septum: Abnormal ventricular septal motion consistent with post-operative state. Small membranous ventricular septal defect present with left to right shunting.  Aortic valve: The valve is trileaflet. Mild valve thickening present. Mild valve calcification present. Trace regurgitation.  Mitral valve: Mild regurgitation.  Tricuspid valve: Mild regurgitation. The tricuspid valve regurgitation jet is central.  Patient Profile     Mr. 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  Assessment & Plan    # Acute on chronic systolic and diastolic function:  Volume status continues to improve.  He now has  no LE edema and lungs are clear on exam.  Renal function is starting to worsen. Lasix changed to 80mg  po daily starting 07/27.  Weight 129>>116 lb since admission.  He is net - 3.8 L by I/Os.    # CAD s/p CABG: Healing well and stable.  Continue aspirin, metoprolol, and atorvastatin.    # Post-op atrial fibrillation: Remains in sinus rhythm.  Continue metoprolol.  He will continue on amiodarone 400mg  daily through 7/28.  At that time he will have completed 5g load and can transition to 200mg  daily.  Duration of therapy to be determined at CT surgery appointment.  Continue aspirin.  Pt has f/u appt with Dr Wyline MoodBranch  Signed, Theodore DemarkBarrett, Yoselin Amerman, PA-C  10/19/2016, 9:19 AM

## 2016-10-19 NOTE — Progress Notes (Signed)
Staples removed from mid-line chest incision. No complications.   Medial sutures x 2 removed without complications.

## 2016-10-19 NOTE — Care Management Important Message (Signed)
Important Message  Patient Details  Name: Devin CostainFrank L Brummond MRN: [id number] Date of Birth: [birth year]   Medicare Important Message Given:  Yes    Dorena BodoIris Brittany Osier 10/19/2016, 2:32 PM

## 2016-10-19 NOTE — Progress Notes (Signed)
  Speech Language Pathology Treatment: Dysphagia  Patient Details Name: Devin CostainFrank L Banfill MRN: [id number] DOB: 02-24-1946 Today's Date: 10/19/2016 Time: [id number] SLP Time Calculation (min) (ACUTE ONLY): 24 min  Assessment / Plan / Recommendation Clinical Impression   F/u after yesterday's MBS> pt with significant dysphagia with severe retention of POs diffusely throughout pharynx and intermittent aspiration. Esophagram completed today - normal motility; no stricture.  Unfortunately, pt requires strict compensatory strategies and modified diet to minimize aspiration risk.  Required min verbal cues to follow through with head turn to right and multiple subswallows per bolus.  Reviewed thickening process with pt and his wife.  D/W CM - pt set to receive home health SLP f/u after D/C today. Will need repeat MBS after minimum of four weeks therapy. Pt/wife agree.     HPI HPI: Pt is a 71 y.o. 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 Patient re-admitted 7/23, states since he has been home, he has been having more dyspnea, cough, congestion with brownish phlegm. Patient has orthopnea, sitting up upright and worsening LE edema. CXR showed small left and trace right pleural effusions. Bedside swallow eval ordered due to "cough when eating".       SLP Plan  Discharge SLP treatment due to (comment) D/C from hospital      Recommendations  Diet recommendations: Dysphagia 2 (fine chop);Nectar-thick liquid Liquids provided via: Cup Medication Administration: Crushed with puree Supervision: Patient able to self feed Compensations: Slow rate;Small sips/bites;Multiple dry swallows after each bite/sip;Clear throat intermittently;Effortful swallow;Other (Comment) Postural Changes and/or Swallow Maneuvers: Seated  upright 90 degrees                Follow up Recommendations: Home health SLP SLP Visit Diagnosis: Dysphagia, pharyngoesophageal phase (R13.14) Plan: Discharge SLP treatment due to (comment)       GO                Devin Barton, Devin Barton 10/19/2016, 11:12 AM

## 2016-10-19 NOTE — Progress Notes (Signed)
PROGRESS NOTE    Devin CostainFrank L Barton  WUJ:[id number] DOB: 06-06-1945 DOA: 10/15/2016 PCP: [name], John, MD   Brief Narrative: 71 year old male with nicotine abuse (>50pack years), COPD, CAD s/p CABG on 10/05/16, combined systolic and diastolic CHF echo with EF 25-30% with grade 1 diastolic dysfunction, HTN who was just discharged on 7/21 from CT surgery service after CABG. patient presented with dyspnea on exertion, cough, congestion and worsening lower extremity edema consistent with CHF exacerbation.  Assessment & Plan:   Acute on chronic combined systolic and diastolic congestive heart failure: -Echo done on July 13 consistent with EF of 35-40%.  cxr on admission with small bilateral pleural effusions,  --cardiology consulted, meds adjustment per cardiology, he is improving, currently on lopressor/lasix -continue low-salt diet, instructed ins and outs, daily weight.   Acute respiratory failure with hypoxia in the setting of CHF exacerbation. He report he was discharged home on oxygen from recent hospitalization. -doppler LE negative for DVT, CTA + bilateral pleural effusions, no pneumonia, negative for PE. -Currently on 2 L of oxygen. Try to wean down gradually.    post op Paroxysmal atrial fibrillation:  He is started on amiodarone loading, he is continued on metoprolol. On aspirin 325 per cardiology recommendation. He is in sinus rhythm currently  Coronary artery disease status post recent CABG (on 10/05/2016):  Cardiothoracic surgeon following. Continue current cardiac medication. The chest wound is healing and has  Staples. -Continue aspirin, Lipitor -Mild elevation in troponin likely due to CHF and recent CABG.   History of COPD:  patient's symptoms are likely mainly contributed by congestive heart failure. Do not think patient has acute exacerbation. Chest x-ray with no pneumonia .therefore  Solu-Medrol  And ceftriaxone discontinued.  -Continue bronchodilators and nebulizers and  treatment. Patient follows up with pulmonologist outpatient. -doppler LE negative for DVT, CTA negative for PE. -patient report chronic cough, it seems cough is related when he eats, speech eval ordered.  Dysphagia:  Appreciate speech input, diet modified DG esophagus pending Wife requested nutrition consult ordered per wife's request Body mass index is 17.73 kg/m.   GERD: Continue Protonix  BPH: Continue Flomax  Chronic Insomnia: prn ambien   FTT: Pt eval " Patient is functioning at Mod I to supervision level with all mobility and gait.  Good balance during gait.  No further acute PT needs identified - PT will sign off.  Encouraged patient to ambulate in hallway with nursing."  DVT prophylaxis: Lovenox subcutaneous Code Status: Full code Family Communication: Discussed with the patient's wife at bedside Disposition Plan: likely home with home health on 7/28 , need clearance from cardiology,     Consultants:   Cardiology  Cardio thoracic surgeon  Procedures: None Antimicrobials: Discontinue ceftriaxone  Subjective: Patient is sitting in chair, Edema is improving, R>l, he reports DOE is improving,  he continue to have chronic cough, now on modified diet No fever Wife at bedside  Objective: Vitals:   10/19/16 0113 10/19/16 0436 10/19/16 1100 10/19/16 1222  BP:  (!) 116/55 128/62 117/61  Pulse:  63 79 71  Resp:  19  18  Temp:  98 F (36.7 C)  97.6 F (36.4 C)  TempSrc:  Oral  Oral  SpO2:  96%  98%  Weight: 52.9 kg (116 lb 9.6 oz)     Height:        Intake/Output Summary (Last 24 hours) at 10/19/16 1242 Last data filed at 10/19/16 1223  Gross per 24 hour  Intake  360 ml  Output             1000 ml  Net             -640 ml   Filed Weights   10/17/16 0612 10/18/16 0447 10/19/16 0113  Weight: 58.3 kg (128 lb 8 oz) 55 kg (121 lb 3.2 oz) 52.9 kg (116 lb 9.6 oz)    Examination:  General exam: frail, chronically ill, NAD, Appears calm and  comfortable  Respiratory system: Bibasal crackles improving, respiratory effort normal. No wheezing Cardiovascular system: S1 & S2 heard, RRR.  Bilateral lower extremities pitting edema. Gastrointestinal system: Abdomen is nondistended, soft and nontender. Normal bowel sounds heard. Central nervous system: Alert and oriented. No focal neurological deficits. Extremities: bilateral lower extremity edema improving, right > left Skin: No rashes, lesions or ulcers Psychiatry: Judgement and insight appear normal. Mood & affect appropriate.     Data Reviewed: I have personally reviewed following labs and imaging studies  CBC:  Recent Labs Lab 10/15/16 1258 10/18/16 0449  WBC 12.4* 14.4*  HGB 10.8* 10.7*  HCT 32.8* 33.6*  MCV 90.6 90.8  PLT 705* 778*   Basic Metabolic Panel:  Recent Labs Lab 10/15/16 1258 10/16/16 0531 10/17/16 0520 10/18/16 0449 10/19/16 0330  NA 131* 132* 135 135 135  K 4.3 3.8 3.8 4.1 3.7  CL 94* 92* 94* 93* 96*  CO2 29 31 34* 34* 32  GLUCOSE 133* 149* 122* 91 104*  BUN 11 14 18  23* 26*  CREATININE 0.83 0.88 0.84 1.06 1.10  CALCIUM 8.8* 8.9 8.9 8.9 8.9  MG  --   --   --  2.2  --    GFR: Estimated Creatinine Clearance: 46.1 mL/min (by C-G formula based on SCr of 1.1 mg/dL). Liver Function Tests: No results for input(s): AST, ALT, ALKPHOS, BILITOT, PROT, ALBUMIN in the last 168 hours. No results for input(s): LIPASE, AMYLASE in the last 168 hours. No results for input(s): AMMONIA in the last 168 hours. Coagulation Profile: No results for input(s): INR, PROTIME in the last 168 hours. Cardiac Enzymes:  Recent Labs Lab 10/15/16 1817 10/16/16 0001 10/16/16 0531  TROPONINI 0.15* 0.04* 0.03*   BNP (last 3 results) No results for input(s): PROBNP in the last 8760 hours. HbA1C: No results for input(s): HGBA1C in the last 72 hours. CBG: No results for input(s): GLUCAP in the last 168 hours. Lipid Profile: No results for input(s): CHOL, HDL,  LDLCALC, TRIG, CHOLHDL, LDLDIRECT in the last 72 hours. Thyroid Function Tests:  Recent Labs  10/18/16 0449  TSH 3.666  FREET4 1.19*   Anemia Panel: No results for input(s): VITAMINB12, FOLATE, FERRITIN, TIBC, IRON, RETICCTPCT in the last 72 hours. Sepsis Labs: No results for input(s): PROCALCITON, LATICACIDVEN in the last 168 hours.  No results found for this or any previous visit (from the past 240 hour(s)).       Radiology Studies: Dg Esophagus  Result Date: 10/19/2016 CLINICAL DATA:  Dysphasia. EXAM: ESOPHOGRAM/BARIUM SWALLOW TECHNIQUE: Single contrast examination was performed using nectar thick barium. FLUOROSCOPY TIME:  Fluoroscopy Time:  48 seconds Radiation Exposure Index (if provided by the fluoroscopic device): na Number of Acquired Spot Images: 0 COMPARISON:  10/15/2016 FINDINGS: Examination was performed with the patient in the upright orientation only. The esophagus is patent. There is no stricture or mass identified. The motility of the esophagus appears within normal limits. No hiatal hernia or reflux identified. IMPRESSION: 1. Patent esophagus.  No stricture mass visualized. Electronically Signed  By: Signa Kell M.D.   On: 10/19/2016 10:39   Dg Swallowing Func-speech Pathology  Result Date: 10/18/2016 Objective Swallowing Evaluation: Type of Study: MBS-Modified Barium Swallow Study Patient Details Name: Devin Barton MRN: [id number] Date of Birth: 1945/05/28 Today's Date: 10/18/2016 Time: SLP Start Time (ACUTE ONLY): 0935-SLP Stop Time (ACUTE ONLY): 1000 SLP Time Calculation (min) (ACUTE ONLY): 25 min Past Medical History: Past Medical History: Diagnosis Date . Asthma  . Cigarette smoker  . COPD (chronic obstructive pulmonary disease) (HCC)  . GERD (gastroesophageal reflux disease)  Past Surgical History: Past Surgical History: Procedure Laterality Date . ABDOMINAL HERNIA REPAIR   . BOWEL RESECTION    Bowel perf secondary to trauma . CARDIAC CATHETERIZATION   . CORONARY  ARTERY BYPASS GRAFT N/A 10/05/2016  Procedure: CORONARY ARTERY BYPASS GRAFTING (CABG) x4 with FREE MAMMARY.  ENDOSCOPIC HARVESTING OF RIGHT SAPHENOUS VEIN. (FREE LIMA to LAD, SVG to OM, SVG SEQUENTIALLY to ACUTE MARGINAL and PDA);  Surgeon: Loreli Slot, MD;  Location: [facility] OR;  Service: Open Heart Surgery;  Laterality: N/A; . EXPLORATORY LAPAROTOMY  1986  [name] 08/08/2010 . FOOT FRACTURE SURGERY Left ~ 1965 . FRACTURE SURGERY   . HERNIA REPAIR   . INCISIONAL HERNIA REPAIR  05/2005  [name] 08/08/2010 . INGUINAL HERNIA REPAIR Bilateral  . INGUINAL HERNIA REPAIR Right 09/2006  recurrent/notes 07/27/2010 . ORIF FOOT FRACTURE Left 1990  [name] 08/08/2010 . RIGHT/LEFT HEART CATH AND CORONARY ANGIOGRAPHY N/A 10/04/2016  Procedure: Right/Left Heart Cath and Coronary Angiography;  Surgeon: Corky Crafts, MD;  Location: [facility] INVASIVE CV LAB;  Service: Cardiovascular;  Laterality: N/A; . TEE WITHOUT CARDIOVERSION N/A 10/05/2016  Procedure: TRANSESOPHAGEAL ECHOCARDIOGRAM (TEE);  Surgeon: Loreli Slot, MD;  Location: [facility] OR;  Service: Open Heart Surgery;  Laterality: N/A; . VENTRAL HERNIA REPAIR  09/2006  recurrent/notes 07/27/2010 HPI: Pt is a 71 y.o. 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 Patient re-admitted 7/23, states since he has been home, he has been having more dyspnea, cough, congestion with brownish phlegm. Patient has orthopnea, sitting up upright and worsening LE edema. CXR showed small left and trace right pleural effusions. Bedside swallow eval ordered due to "cough when eating".  No Data Recorded Assessment / Plan / Recommendation CHL IP CLINICAL IMPRESSIONS 10/18/2016 Clinical Impression Pt with a moderate-severe pharyngoesophageal dysphagia. Pharyngeal phase characterized by premature spillage to  the level of the pyriform sinuses, reduced hyolaryngeal excursion and base of tongue retraction along with reduced epiglottic inversion/ airway closure resulting in [place] aspiration of thin liquids before the swallow, along with significant residuals at the UES, pyriform sinuses, and vallecula. These residuals were noted across consistencies. Pt also with significantly reduced opening of the UES and the appearance of significant narrowing of the upper esophagus. Of the compensatory strategies attempted, a head turn to the right side was beneficial in opening of the UES for clearance of material to the esophagus. Pt did have one instance of trace aspiration with nectar-thick liquids with post-swallow residuals. Pt is at an increased risk of aspiration given these factors and respiratory status. Recommend downgrading diet to dysphagia 2/ nectar-thick liquids, meds crushed in puree. Also use the following strategies: head turn to the R side, effortful swallow, multiple swallows. SLP will continue to follow for compensatory strategy review along with exercises for  hyolaryngeal excursion/ base of tongue movement; also consider a repeat MBS before d/c. Pt may also benefit from further esophageal assessment. SLP Visit Diagnosis Dysphagia, pharyngoesophageal phase (R13.14) Attention and concentration deficit following -- Frontal lobe and executive function deficit following -- Impact on safety and function Moderate aspiration risk   CHL IP TREATMENT RECOMMENDATION 10/18/2016 Treatment Recommendations Therapy as outlined in treatment plan below   Prognosis 10/18/2016 Prognosis for Safe Diet Advancement Fair Barriers to Reach Goals Severity of deficits Barriers/Prognosis Comment -- CHL IP DIET RECOMMENDATION 10/18/2016 SLP Diet Recommendations Dysphagia 2 (Fine chop) solids;Nectar thick liquid Liquid Administration via Cup;Straw Medication Administration Crushed with puree Compensations Slow rate;Small sips/bites;Multiple dry  swallows after each bite/sip;Clear throat intermittently;Effortful swallow;Other (Comment) Postural Changes Remain semi-upright after after feeds/meals (Comment);Seated upright at 90 degrees   CHL IP OTHER RECOMMENDATIONS 10/18/2016 Recommended Consults Consider esophageal assessment Oral Care Recommendations Oral care BID Other Recommendations Order thickener from pharmacy;Clarify dietary restrictions   CHL IP FOLLOW UP RECOMMENDATIONS 10/18/2016 Follow up Recommendations Home health SLP   CHL IP FREQUENCY AND DURATION 10/18/2016 Speech Therapy Frequency (ACUTE ONLY) min 3x week Treatment Duration 2 weeks      CHL IP ORAL PHASE 10/18/2016 Oral Phase WFL Oral - Pudding Teaspoon -- Oral - Pudding Cup -- Oral - Honey Teaspoon -- Oral - Honey Cup -- Oral - Nectar Teaspoon -- Oral - Nectar Cup -- Oral - Nectar Straw -- Oral - Thin Teaspoon -- Oral - Thin Cup -- Oral - Thin Straw -- Oral - Puree -- Oral - Mech Soft -- Oral - Regular -- Oral - Multi-Consistency -- Oral - Pill -- Oral Phase - Comment --  CHL IP PHARYNGEAL PHASE 10/18/2016 Pharyngeal Phase Impaired Pharyngeal- Pudding Teaspoon -- Pharyngeal -- Pharyngeal- Pudding Cup -- Pharyngeal -- Pharyngeal- Honey Teaspoon -- Pharyngeal -- Pharyngeal- Honey Cup -- Pharyngeal -- Pharyngeal- Nectar Teaspoon -- Pharyngeal -- Pharyngeal- Nectar Cup Delayed swallow initiation-pyriform sinuses;Reduced epiglottic inversion;Reduced anterior laryngeal mobility;Reduced laryngeal elevation;Reduced airway/laryngeal closure;Reduced tongue base retraction;Pharyngeal residue - valleculae;Pharyngeal residue - pyriform;Pharyngeal residue - cp segment Pharyngeal -- Pharyngeal- Nectar Straw Delayed swallow initiation-pyriform sinuses;Reduced epiglottic inversion;Reduced anterior laryngeal mobility;Reduced laryngeal elevation;Reduced airway/laryngeal closure;Reduced tongue base retraction;Penetration/Apiration after swallow;Trace aspiration;Pharyngeal residue - valleculae;Pharyngeal residue -  pyriform;Pharyngeal residue - cp segment Pharyngeal Material enters airway, passes BELOW cords without attempt by patient to eject out (silent aspiration) Pharyngeal- Thin Teaspoon -- Pharyngeal -- Pharyngeal- Thin Cup Delayed swallow initiation-pyriform sinuses;Reduced epiglottic inversion;Reduced anterior laryngeal mobility;Reduced laryngeal elevation;Reduced airway/laryngeal closure;Reduced tongue base retraction;Penetration/Aspiration before swallow;Moderate aspiration;Pharyngeal residue - valleculae;Pharyngeal residue - pyriform;Pharyngeal residue - cp segment;Compensatory strategies attempted (with notebox) Pharyngeal Material enters airway, passes BELOW cords and not ejected out despite cough attempt by patient Pharyngeal- Thin Straw -- Pharyngeal -- Pharyngeal- Puree Delayed swallow initiation-vallecula;Reduced epiglottic inversion;Reduced anterior laryngeal mobility;Reduced laryngeal elevation;Reduced tongue base retraction;Pharyngeal residue - valleculae;Pharyngeal residue - pyriform;Pharyngeal residue - cp segment;Compensatory strategies attempted (with notebox) Pharyngeal -- Pharyngeal- Mechanical Soft -- Pharyngeal -- Pharyngeal- Regular Delayed swallow initiation-vallecula;Reduced epiglottic inversion;Reduced anterior laryngeal mobility;Reduced laryngeal elevation;Reduced tongue base retraction;Pharyngeal residue - valleculae;Pharyngeal residue - pyriform;Pharyngeal residue - cp segment;Compensatory strategies attempted (with notebox) Pharyngeal -- Pharyngeal- Multi-consistency -- Pharyngeal -- Pharyngeal- Pill -- Pharyngeal -- Pharyngeal Comment head turn to R- somewhat effective in clearing residuals  CHL IP CERVICAL ESOPHAGEAL PHASE 10/18/2016 Cervical Esophageal Phase Impaired Pudding Teaspoon -- Pudding Cup -- Honey Teaspoon -- Honey Cup -- Nectar Teaspoon -- Nectar Cup Reduced cricopharyngeal relaxation Nectar Straw Reduced cricopharyngeal relaxation Thin Teaspoon -- Thin Cup Reduced  cricopharyngeal relaxation Thin Straw -- Puree Reduced cricopharyngeal relaxation Mechanical Soft -- Regular Reduced cricopharyngeal relaxation Multi-consistency -- Pill -- Cervical Esophageal Comment -- No flowsheet data found. Amy Cecille Aver, MA, CCC-SLP 10/18/2016, 10:27 AM [id number]                  Scheduled Meds: . amiodarone  400 mg Oral Daily  . arformoterol  15 mcg Nebulization BID  . aspirin  325 mg Oral Daily  . atorvastatin  40 mg Oral q1800  . budesonide (PULMICORT) nebulizer solution  0.25 mg Nebulization BID  . enoxaparin (LOVENOX) injection  30 mg Subcutaneous Q24H  . furosemide  80 mg Oral Daily  . metoprolol tartrate  12.5 mg Oral BID  . nicotine  21 mg Transdermal Daily  . pantoprazole  40 mg Oral Daily  . potassium chloride SA  20 mEq Oral Daily  . sodium chloride flush  3 mL Intravenous Q12H  . tamsulosin  0.4 mg Oral QPM  . traZODone  50 mg Oral QHS   Continuous Infusions: . sodium chloride       LOS: 4 days   Time spent>  Devin Beach, MD PhD [facility] Pager [phone]  If 7PM-7AM, please contact night-coverage [email or web address] Password [facility] 10/19/2016, 12:42 PM

## 2016-10-19 NOTE — Progress Notes (Signed)
Initial Nutrition Assessment  DOCUMENTATION CODES:   Severe malnutrition in context of chronic illness  INTERVENTION:   Ensure Enlive po BID, each supplement provides 350 kcal and 20 grams of protein  Magic cup BID with meals, each supplement provides 290 kcal and 9 grams of protein   NUTRITION DIAGNOSIS:   Malnutrition (Severe) related to chronic illness (COPD, CAD s/p CABG this month, CHF EF 25-30%) as evidenced by severe depletion of body fat, severe depletion of muscle mass.  GOAL:   Patient will meet greater than or equal to 90% of their needs  MONITOR:   PO intake, Supplement acceptance, Labs, Weight trends  REASON FOR ASSESSMENT:   Consult Assessment of nutrition requirement/status  ASSESSMENT:   71 yo male admitted with acute respiratory failure due to COPD exacerbation, acute on chronic CHF. Pt also with dysphagia.  Pt with hx of COPD, CAD s/p CABG 10/05/16, CHF EF 25-30%, HTN, bowel resection  Recorded po intake 57% of meals on average. Pt reports eating minimally since discharge earlier this month. Pt eating bites/sips of meals only. Pt does drink Boost, 1 per day, and has done so for many years. Pt believes he may be able to drink 2 a day, encouraged pt to increase intake of nutritional supplements at discharge  Per wt encounters, 15% wt loss this month??. Previous wt encounter may not have been dry wt. Noted pt down >10 pounds since admission (net negative 4 L) likely related to fluid losses with diuresis  Nutrition-Focused physical exam completed. Findings are severe fat depletion, moderate to severe muscle depletion, and moderate edema.   Labs: reviewed Meds: lasix  Diet Order:  DIET DYS 2 Room service appropriate? Yes; Fluid consistency: Nectar Thick Diet - low sodium heart healthy  Skin:  Reviewed, no issues  Last BM:  7/26  Height:   Ht Readings from Last 1 Encounters:  10/15/16 5\' 8"  (1.727 m)    Weight:   Wt Readings from Last 1 Encounters:   10/19/16 116 lb 9.6 oz (52.9 kg)    Ideal Body Weight:  70 kg  BMI:  Body mass index is 17.73 kg/m.  Estimated Nutritional Needs:   Kcal:  1520-1770 kcals  Protein:  75-89 g  Fluid:  >/= 1.5 L  EDUCATION NEEDS:   No education needs identified at this time  Romelle StarcherCate Deniese Oberry MS, RD, LDN [phone] Pager  [phone] Weekend/On-Call Pager

## 2016-10-19 NOTE — Discharge Summary (Signed)
Discharge Summary  Devin CostainFrank L Dalporto MWU:[id number] DOB: February 21, 1946  PCP: Assunta Barton, John, MD  Admit date: 10/15/2016 Discharge date: 10/20/2016  Time spent: >3630mins, more than 50% time spent on coordination of care and patient/family counseling  Recommendations for Outpatient Follow-up:  1. F/u with PMD on 8/1 for hospital discharge follow up, repeat cbc/bmp at follow up 2. F/u with cardiology on 8/2 3. F/u with cardiothoracic surgery on 8/21 4. Home health RN/speech therapy arranged, continue home o2  Discharge Diagnoses:  Active Hospital Problems   Diagnosis Date Noted  . Acute respiratory failure (HCC) 10/15/2016  . S/P CABG (coronary artery bypass graft)   . PAF (paroxysmal atrial fibrillation) (HCC)   . Acute on chronic combined systolic and diastolic CHF (congestive heart failure) (HCC)   . CAD (coronary artery disease) 10/04/2016  . Acute systolic heart failure (HCC)   . COPD exacerbation (HCC) 06/06/2012  . HYPERTENSION, BENIGN 08/15/2007    Resolved Hospital Problems   Diagnosis Date Noted Date Resolved  No resolved problems to display.    Discharge Condition: stable  Diet recommendation: heart healthy  Diet recommendations: Dysphagia 2 (fine chop);Nectar-thick liquid Liquids provided via: Cup Medication Administration: Crushed with puree Supervision: Patient able to self feed Compensations: Slow rate;Small sips/bites;Multiple dry swallows after each bite/sip;Clear throat intermittently;Effortful swallow;Other (Comment) Postural Changes and/or Swallow Maneuvers: Seated upright 90 degrees  Filed Weights   10/18/16 0447 10/19/16 0113 10/20/16 0331  Weight: 55 kg (121 lb 3.2 oz) 52.9 kg (116 lb 9.6 oz) 53.1 kg (117 lb)    History of present illness:   Chief Complaint:  Worsening SOB, coughing with brownish phlegm, orthopnea, LE edema worsening in last 2 days   HPI: Patient is a 71 year old male with nicotine abuse (>50pack years), COPD, CAD s/p CABG on  10/05/16, combined systolic and diastolic CHF echo with EF 25-30% with grade 1 diastolic dysfunction, HTN who was just discharged on 7/21 from CT surgery service after CABG. Patient states since he has been home, he has been having more dyspnea, cough, congestion with brownish phlegm. Patient has orthopnea, sitting up upright and worsening LE edema. He reports that his right leg was somewhat swollen when he was discharged but today he noticed that his left leg is getting swollen.   He denied any fever, chills, chest pain or diaphoresis. No prior history of DVT or PE.  He felt so congested that he was using albuterol inhaler and ran out of albuterol inhaler.  Patient states   ED work-up/course:  Temp 97.9, RR 20, BP 113/65, O2 sats 100% 2L  BNP 812 Trop 0.02 CBC, BMET unremarkable  CXR : small left and trace right pleural effusions  EKG: NSR, 75, QTc 527,   Hospital Course:  Principal Problem:   Acute respiratory failure (HCC) Active Problems:   HYPERTENSION, BENIGN   COPD exacerbation (HCC)   Acute systolic heart failure (HCC)   CAD (coronary artery disease)   Acute on chronic combined systolic and diastolic CHF (congestive heart failure) (HCC)   S/P CABG (coronary artery bypass graft)   PAF (paroxysmal atrial fibrillation) (HCC)   Acute on chronic combined systolic and diastolic congestive heart failure: -Echo done on July 13 consistent with EF of 25-30%.  cxr on admission with small bilateral pleural effusions,  --cardiology consulted,  he is improving on lopressor/lasix -he is cleared to discharge home by cardiology on lopressor and lasix, he is to follow up with cardiology on 8/2.   Acute respiratory failure with hypoxia  in the setting of CHF exacerbation/bilateral pleural effusions. He report he was discharged home on oxygen from recent hospitalization. -doppler LE negative for DVT, CTA + bilateral pleural effusions, no pneumonia, negative for PE. -Currently on 2 L of  oxygen. He is discharge home with continue home o2 at [facility]   post op Paroxysmal atrial fibrillation:  He finished amiodarone loading in the hospital with amiodarone 400mg  daily, last dose on 7/28, he is discharged home on amiodarone 200mg  daily, duration to be determined by cardiothoracic surgery  he is continued on metoprolol. On aspirin 325 per cardiology recommendation. He is in sinus rhythm currently  Coronary artery disease status post recent CABG (on 10/05/2016):  - Continue current cardiac medication. aspirin, Lipitor, lopressor - chest staples and chest tube suture removed on 7/27   -Cardiothoracic surgeon consulted in the hospital, patient is to continue follow with cardiothoracic surgery after discharge.  History of COPD:  patient's presenting symptoms are likely mainly contributed by congestive heart failure. Do not think patient has acute exacerbation. Chest x-ray with no pneumonia .therefore  Solu-Medrol  And ceftriaxone discontinued.  -Continue bronchodilators and nebulizers and treatment. Patient follows up with pulmonologist outpatient. -doppler LE negative for DVT, CTA negative for PE. -wife report patient has tachycardia with albuterol, she wants to discontinue  albuterol  -patient reports chronic cough, it seems cough is worse when he eats, speech eval ordered.  Dysphagia:  MBS with  "significant dysphagia with severe retention of POs diffusely throughout pharynx and intermittent aspiration" DG esophagus unremarkable Speech therapy consulted, input appreciated , per speech "pt requires strict compensatory strategies and modified diet to minimize aspiration risk.  Required min verbal cues to follow through with head turn to right and multiple subswallows per bolus. " "pt set to receive home health SLP f/u after D/C today. Will need repeat MBS after minimum of four weeks therapy"  Severe malnutrition in context of chronic illness Body mass index is 18.43  kg/m. "Findings are severe fat depletion, moderate to severe muscle depletion"  Nutrition input appreciated.   GERD: Continue Protonix  BPH: Continue Flomax  Chronic Insomnia: prn ambien   FTT: Pt eval " Patient is functioning at Mod I to supervision level with all mobility and gait. Good balance during gait. No further acute PT needs identified - PT will sign off. Encouraged patient to ambulate in hallway with nursing."  DVT prophylaxis while in the hospital: Lovenox subcutaneous Code Status: Full code Family Communication: Discussed with the patient's wife at bedside Disposition Plan:  home with home health on 7/28 with  Cardiology clearance,     Consultants:   Cardiology  Cardio thoracic surgeon  Procedures: None Antimicrobials: Discontinue ceftriaxone   Discharge Exam: BP (!) 105/52 (BP Location: Left Arm)   Pulse 74   Temp 97.6 F (36.4 C) (Oral)   Resp 20   Ht 5\' 8"  (1.727 m)   Wt 53.1 kg (117 lb)   SpO2 97%   BMI 17.79 kg/m   General: frail, chronically ill, NAD Cardiovascular: RRR Respiratory: bibasilar crackles has almost resolved, no wheezing, some rhonchi vs upper airway sounds Extremity: he presented with bilateral lower extremity edema R>L, at discharge left lower extremity edema has resolved, right lower extremity edema has much improved ( vein harvested from right leg for CABG)  Discharge Instructions You were cared for by a hospitalist during your hospital stay. If you have any questions about your discharge medications or the care you received while you were in the hospital  after you are discharged, you can call the unit and asked to speak with the hospitalist on call if the hospitalist that took care of you is not available. Once you are discharged, your primary care physician will handle any further medical issues. Please note that NO REFILLS for any discharge medications will be authorized once you are discharged, as it is imperative  that you return to your primary care physician (or establish a relationship with a primary care physician if you do not have one) for your aftercare needs so that they can reassess your need for medications and monitor your lab values.  Discharge Instructions    Diet - low sodium heart healthy    Complete by:  As directed    Diet - low sodium heart healthy    Complete by:  As directed    Discharge instructions    Complete by:  As directed    Diet recommendations: Dysphagia 2 (fine chop);Nectar-thick liquid Liquids provided via: Cup Medication Administration: Crushed with puree Supervision: Patient able to self feed Compensations: Slow rate;Small sips/bites;Multiple dry swallows after each bite/sip;Clear throat intermittently;Effortful swallow;Other (Comment) Postural Changes and/or Swallow Maneuvers: Seated upright 90 degrees   Face-to-face encounter (required for Medicare/Medicaid patients)    Complete by:  As directed    I Adriaan Maltese certify that this patient is under my care and that I, or a nurse practitioner or physician's assistant working with me, had a face-to-face encounter that meets the physician face-to-face encounter requirements with this patient on 10/18/2016. The encounter with the patient was in whole, or in part for the following medical condition(s) which is the primary reason for home health care (List medical condition): FTT   The encounter with the patient was in whole, or in part, for the following medical condition, which is the primary reason for home health care:  FTT   I certify that, based on my findings, the following services are medically necessary home health services:  Nursing   Reason for Medically Necessary Home Health Services:  Skilled Nursing- Change/Decline in Patient Status   My clinical findings support the need for the above services:  Shortness of breath with activity   Further, I certify that my clinical findings support that this patient is homebound due  to:  Shortness of Breath with activity   For home use only DME oxygen    Complete by:  As directed    Mode or (Route):  Nasal cannula   Liters per Minute:  2   Frequency:  Continuous (stationary and portable oxygen unit needed)   Oxygen conserving device:  Yes   Oxygen delivery system:  Gas   Home Health    Complete by:  As directed    To provide the following care/treatments:   RN SLP     Increase activity slowly    Complete by:  As directed    Increase activity slowly    Complete by:  As directed      Allergies as of 10/20/2016      Reactions   No Known Allergies       Medication List    STOP taking these medications   PROAIR HFA 108 (90 Base) MCG/ACT inhaler Generic drug:  albuterol     TAKE these medications   amiodarone 200 MG tablet Commonly known as:  PACERONE Take 1 tablet (200 mg total) by mouth daily. What changed:  medication strength  how much to take  when to take this  Another medication with  the same name was removed. Continue taking this medication, and follow the directions you see here.   arformoterol 15 MCG/2ML Nebu Commonly known as:  BROVANA Take 2 mLs (15 mcg total) by nebulization 2 (two) times daily.   aspirin 325 MG EC tablet Take 1 tablet (325 mg total) by mouth daily.   atorvastatin 40 MG tablet Commonly known as:  LIPITOR Take 1 tablet (40 mg total) by mouth daily at 6 PM. What changed:  medication strength  how much to take   budesonide 0.25 MG/2ML nebulizer solution Commonly known as:  PULMICORT Take 2 mLs (0.25 mg total) by nebulization 2 (two) times daily.   feeding supplement (ENSURE ENLIVE) Liqd Take 237 mLs by mouth 2 (two) times daily between meals.   furosemide 40 MG tablet Commonly known as:  LASIX Take 2 tablets (80 mg total) by mouth daily. What changed:  how much to take   ipratropium 0.02 % nebulizer solution Commonly known as:  ATROVENT Take 0.5 mg by nebulization daily as needed for wheezing or  shortness of breath.   levalbuterol 0.63 MG/3ML nebulizer solution Commonly known as:  XOPENEX Take 3 mLs (0.63 mg total) by nebulization every 6 (six) hours as needed for wheezing or shortness of breath.   metoprolol tartrate 25 MG tablet Commonly known as:  LOPRESSOR Take 0.5 tablets (12.5 mg total) by mouth 2 (two) times daily.   omeprazole 40 MG capsule Commonly known as:  PRILOSEC Take 40 mg by mouth daily.   potassium chloride SA 20 MEQ tablet Commonly known as:  K-DUR,KLOR-CON Take 1 tablet (20 mEq total) by mouth daily.   RESOURCE THICKENUP CLEAR Powd As needed for a month supple   tamsulosin 0.4 MG Caps capsule Commonly known as:  FLOMAX Take 0.4 mg by mouth every evening.   traMADol 50 MG tablet Commonly known as:  ULTRAM Take 1 tablet (50 mg total) by mouth every 6 (six) hours as needed for moderate pain.            Durable Medical Equipment        Start     Ordered   10/19/16 0000  For home use only DME oxygen    Question Answer Comment  Mode or (Route) Nasal cannula   Liters per Minute 2   Frequency Continuous (stationary and portable oxygen unit needed)   Oxygen conserving device Yes   Oxygen delivery system Gas      10/19/16 1228     Allergies  Allergen Reactions  . No Known Allergies    Follow-up Information    Assunta Barton, John, MD Follow up on 10/24/2016.   Specialty:  Family Medicine Why:  hospital discharge follow up @9 :15am pmd to coordinate with speech therapist to arrange modified barium swallow study in 4-6 weeks Contact information: [address] [place] [address] [phone]        Antoine PocheBranch, Jonathan F, MD Follow up on 10/25/2016.   Specialty:  Cardiology Contact information: [address] [place] [address] [phone]            The results of significant diagnostics from this hospitalization (including imaging, microbiology, ancillary and laboratory) are listed below for reference.    Significant  Diagnostic Studies: Dg Chest 2 View  Result Date: 10/15/2016 CLINICAL DATA:  Cough, shortness of breath EXAM: CHEST  2 VIEW COMPARISON:  10/09/2016 FINDINGS: Small left and trace right pleural effusions. Mild patchy left lower lobe opacity, likely atelectasis. No [place] interstitial edema.  No  pneumothorax. The heart is normal in size. Postsurgical changes related to prior CABG. Median sternotomy.  Midline skin staples. Thoracic spine is within normal limits. IMPRESSION: Small left and trace right pleural effusions. Electronically Signed   By: Charline Bills M.D.   On: 10/15/2016 13:48   Dg Chest 2 View  Result Date: 10/09/2016 CLINICAL DATA:  CABG. EXAM: CHEST  2 VIEW COMPARISON:  10/08/2016. FINDINGS: Interim removal right IJ sheath. Prior median sternotomy and CABG. Cardiomegaly with normal pulmonary vascularity. Low lung volumes. Interim improvement of bibasilar infiltrates/edema with mild residual . Tiny bilateral pleural effusions again cannot be excluded. No pneumothorax. IMPRESSION: 1. Interim removal of right IJ sheath. Prior CABG. Stable cardiomegaly. 2. Low lung volumes. Interim improvement of bibasilar infiltrates/edema with mild residual. Tiny bilateral pleural effusions again cannot be excluded. Electronically Signed   By: Maisie Fus  Register   On: 10/09/2016 07:32   Ct Angio Chest Pe W Or Wo Contrast  Result Date: 10/15/2016 CLINICAL DATA:  Post op 1.5 weeks for open heart surgery. Pt is only complaining of a bit of chest pain where the staples are. No other chest complaints. EXAM: CT ANGIOGRAPHY CHEST WITH CONTRAST TECHNIQUE: Multidetector CT imaging of the chest was performed using the standard protocol during bolus administration of intravenous contrast. Multiplanar CT image reconstructions and MIPs were obtained to evaluate the vascular anatomy. CONTRAST:  100 ml of isovue 370 given. COMPARISON:  06/07/2012 FINDINGS: Cardiovascular: Mild four-chamber cardiac enlargement. Satisfactory  opacification of pulmonary arteries noted, and there is no evidence of pulmonary emboli. Previous CABG. Calcified plaque scattered through the thoracic aorta. No dissection, aneurysm, or stenosis. Classic 3 vessel brachiocephalic arterial origin anatomy without proximal stenosis. Visualized proximal abdominal aorta is atheromatous without aneurysm. Mediastinum/Nodes: Small pericardial effusion. No hilar or mediastinal adenopathy. Lungs/Pleura: Small right and moderate left pleural effusions without suggestion of loculation or pleural enhancement. Dependent atelectasis posteriorly in the lower lobes. Lungs otherwise clear. No pneumothorax. Upper Abdomen: No acute findings. Musculoskeletal: Previous median sternotomy. Anterior skin staples. No fracture or worrisome bone lesion. Review of the MIP images confirms the above findings. IMPRESSION: 1. Pleural effusions, left greater than right. 2. Negative for acute PE or thoracic aortic dissection. 3. Status post median sternotomy and CABG. Electronically Signed   By: Corlis Leak M.D.   On: 10/15/2016 21:28   Dg Esophagus  Result Date: 10/19/2016 CLINICAL DATA:  Dysphasia. EXAM: ESOPHOGRAM/BARIUM SWALLOW TECHNIQUE: Single contrast examination was performed using nectar thick barium. FLUOROSCOPY TIME:  Fluoroscopy Time:  48 seconds Radiation Exposure Index (if provided by the fluoroscopic device): na Number of Acquired Spot Images: 0 COMPARISON:  10/15/2016 FINDINGS: Examination was performed with the patient in the upright orientation only. The esophagus is patent. There is no stricture or mass identified. The motility of the esophagus appears within normal limits. No hiatal hernia or reflux identified. IMPRESSION: 1. Patent esophagus.  No stricture mass visualized. Electronically Signed   By: Signa Kell M.D.   On: 10/19/2016 10:39   Dg Chest Port 1 View  Result Date: 10/08/2016 CLINICAL DATA:  Recent chest tube removal. EXAM: PORTABLE CHEST 1 VIEW COMPARISON:   10/07/2016 . FINDINGS: Right IJ sheath in stable position. Left chest tube in stable position. Very minimal residual left apical pneumothorax. Prior CABG. Heart size stable. Progressive bibasilar atelectasis. Bibasilar infiltrates/edema noted on today's exam. Small bilateral pleural effusions. No pneumothorax. IMPRESSION: 1. Right IJ sheath and left chest tube in stable position. Very minimal residual left apical pneumothorax. 2.  Prior CABG.  Heart size stable. 3. Progressive bibasilar atelectasis. Bibasilar infiltrates/ edema noted on today's exam. Small bilateral pleural effusions. A component of mild CHF may be present . Electronically Signed   By: Maisie Fus  Register   On: 10/08/2016 07:16   Dg Chest Port 1 View  Result Date: 10/07/2016 CLINICAL DATA:  Followup recent CABG. EXAM: PORTABLE CHEST 1 VIEW COMPARISON:  Yesterday. FINDINGS: The right jugular Swan-Ganz catheter has been removed and the sheath remains in place. Mediastinal and left chest tubes remain in place. Possible tiny left apical pneumothorax. Mildly increased bibasilar airspace opacity. The cardiac silhouette remains borderline enlarged and post CABG changes are stable. Unremarkable bones. IMPRESSION: 1. Possible tiny left apical pneumothorax. 2. Mildly increased bibasilar atelectasis. Electronically Signed   By: Beckie Salts M.D.   On: 10/07/2016 07:45   Dg Chest Port 1 View  Result Date: 10/06/2016 CLINICAL DATA:  Status post CABG surgery. EXAM: PORTABLE CHEST 1 VIEW COMPARISON:  10/05/2016 FINDINGS: Since prior study, the endotracheal tube and the nasogastric tube have been removed. The right internal jugular Swan-Ganz catheter, mediastinal tube and left chest tube remain in place, well positioned. There is opacity at the medial lung bases, mildly increased from the previous day's exam, consistent with atelectasis. Lungs are otherwise clear. No pneumothorax. Cardiac silhouette is normal in size.  No mediastinal widening. IMPRESSION: 1.  No acute findings or evidence of an operative complication. No pulmonary edema, mediastinal widening or pneumothorax. 2. Remaining support apparatus is stable and well positioned. Electronically Signed   By: Amie Portland M.D.   On: 10/06/2016 07:30   Dg Chest Port 1 View  Result Date: 10/05/2016 CLINICAL DATA:  S/P CABG x 4 EXAM: PORTABLE CHEST - 1 VIEW COMPARISON:  10/04/2016 FINDINGS: Interval median sternotomy and CABG. Endotracheal tube in place with tip 6.7 cm above carina. Nasogastric tube extends at least as far as the stomach, tip not seen. Left chest tube placed with no pneumothorax evident. Right IJ Swan-Ganz to the proximal pulmonary artery. Patchy subsegmental atelectasis in the left infrahilar region. Lungs are otherwise clear. Heart size and mediastinal contours are within normal limits. Atheromatous aorta. No effusion. IMPRESSION: 1. Interval CABG with support hardware in expected location. 2. No pneumothorax. Electronically Signed   By: Corlis Leak M.D.   On: 10/05/2016 13:49   Dg Chest Port 1 View  Result Date: 10/05/2016 CLINICAL DATA:  Chest pain EXAM: PORTABLE CHEST 1 VIEW COMPARISON:  08/24/2016 FINDINGS: Lead or tubing over the central chest, and coiled over the neck and thoracic inlet. No acute infiltrate or effusion. No acute consolidation or pleural effusion. Normal cardiomediastinal silhouette with atherosclerosis. No pneumothorax. Vague opacity in the right upper lobe. IMPRESSION: 1. Vague opacity in the right upper lobe could reflect summation artifact or small focus of atelectasis or inflammation. Radiographic follow-up suggested. 2. Otherwise no radiographic evidence for acute cardiopulmonary abnormality. Electronically Signed   By: Jasmine Pang M.D.   On: 10/05/2016 00:11   Dg Swallowing Func-speech Pathology  Result Date: 10/18/2016 Objective Swallowing Evaluation: Type of Study: MBS-Modified Barium Swallow Study Patient Details Name: SIAOSI ALTER MRN: [id number] Date of  Birth: [birth year] Today's Date: 10/18/2016 Time: SLP Start Time (ACUTE ONLY): 0935-SLP Stop Time (ACUTE ONLY): 1000 SLP Time Calculation (min) (ACUTE ONLY): 25 min Past Medical History: Past Medical History: Diagnosis Date . Asthma  . Cigarette smoker  . COPD (chronic obstructive pulmonary disease) (HCC)  . GERD (gastroesophageal reflux disease)  Past Surgical History: Past Surgical History: Procedure Laterality Date .  ABDOMINAL HERNIA REPAIR   . BOWEL RESECTION    Bowel perf secondary to trauma . CARDIAC CATHETERIZATION   . CORONARY ARTERY BYPASS GRAFT N/A 10/05/2016  Procedure: CORONARY ARTERY BYPASS GRAFTING (CABG) x4 with FREE MAMMARY.  ENDOSCOPIC HARVESTING OF RIGHT SAPHENOUS VEIN. (FREE LIMA to LAD, SVG to OM, SVG SEQUENTIALLY to ACUTE MARGINAL and PDA);  Surgeon: Loreli Slot, MD;  Location: [facility] OR;  Service: Open Heart Surgery;  Laterality: N/A; . EXPLORATORY LAPAROTOMY  1986  [name] 08/08/2010 . FOOT FRACTURE SURGERY Left ~ 1965 . FRACTURE SURGERY   . HERNIA REPAIR   . INCISIONAL HERNIA REPAIR  05/2005  [name] 08/08/2010 . INGUINAL HERNIA REPAIR Bilateral  . INGUINAL HERNIA REPAIR Right 09/2006  recurrent/notes 07/27/2010 . ORIF FOOT FRACTURE Left 1990  [name] 08/08/2010 . RIGHT/LEFT HEART CATH AND CORONARY ANGIOGRAPHY N/A 10/04/2016  Procedure: Right/Left Heart Cath and Coronary Angiography;  Surgeon: Corky Crafts, MD;  Location[facility] INVASIVE CV LAB;  Service: Cardiovascular;  Laterality: N/A; . TEE WITHOUT CARDIOVERSION N/A 10/05/2016  Procedure: TRANSESOPHAGEAL ECHOCARDIOGRAM (TEE);  Surgeon: Loreli Slot, MD;  Location: [facility] OR;  Service: Open Heart Surgery;  Laterality: N/A; . VENTRAL HERNIA REPAIR  09/2006  recurrent/notes 07/27/2010 HPI: Pt is a 71 y.o. male with PMH of nicotine abuse (>50pack years), COPD, CAD s/p CABG on 10/05/16, combined systolic and diastolic CHF echo with EF 25-30% with grade 1 diastolic dysfunction, HTN who was just discharged on 7/21 from CT surgery service after  CABG. Patient re-admitted 7/23, states since he has been home, he has been having more dyspnea, cough, congestion with brownish phlegm. Patient has orthopnea, sitting up upright and worsening LE edema. CXR showed small left and trace right pleural effusions. Bedside swallow eval ordered due to "cough when eating".  No Data Recorded Assessment / Plan / Recommendation CHL IP CLINICAL IMPRESSIONS 10/18/2016 Clinical Impression Pt with a moderate-severe pharyngoesophageal dysphagia. Pharyngeal phase characterized by premature spillage to the level of the pyriform sinuses, reduced hyolaryngeal excursion and base of tongue retraction along with reduced epiglottic inversion/ airway closure resulting in [place] aspiration of thin liquids before the swallow, along with significant residuals at the UES, pyriform sinuses, and vallecula. These residuals were noted across consistencies. Pt also with significantly reduced opening of the UES and the appearance of significant narrowing of the upper esophagus. Of the compensatory strategies attempted, a head turn to the right side was beneficial in opening of the UES for clearance of material to the esophagus. Pt did have one instance of trace aspiration with nectar-thick liquids with post-swallow residuals. Pt is at an increased risk of aspiration given these factors and respiratory status. Recommend downgrading diet to dysphagia 2/ nectar-thick liquids, meds crushed in puree. Also use the following strategies: head turn to the R side, effortful swallow, multiple swallows. SLP will continue to follow for compensatory strategy review along with exercises for hyolaryngeal excursion/ base of tongue movement; also consider a repeat MBS before d/c. Pt may also benefit from further esophageal assessment. SLP Visit Diagnosis Dysphagia, pharyngoesophageal phase (R13.14) Attention and concentration deficit following -- Frontal lobe and executive function deficit following -- Impact on safety  and function Moderate aspiration risk   CHL IP TREATMENT RECOMMENDATION 10/18/2016 Treatment Recommendations Therapy as outlined in treatment plan below   Prognosis 10/18/2016 Prognosis for Safe Diet Advancement Fair Barriers to Reach Goals Severity of deficits Barriers/Prognosis Comment -- CHL IP DIET RECOMMENDATION 10/18/2016 SLP Diet Recommendations Dysphagia 2 (Fine chop) solids;Nectar thick liquid Liquid Administration via  Cup;Straw Medication Administration Crushed with puree Compensations Slow rate;Small sips/bites;Multiple dry swallows after each bite/sip;Clear throat intermittently;Effortful swallow;Other (Comment) Postural Changes Remain semi-upright after after feeds/meals (Comment);Seated upright at 90 degrees   CHL IP OTHER RECOMMENDATIONS 10/18/2016 Recommended Consults Consider esophageal assessment Oral Care Recommendations Oral care BID Other Recommendations Order thickener from pharmacy;Clarify dietary restrictions   CHL IP FOLLOW UP RECOMMENDATIONS 10/18/2016 Follow up Recommendations Home health SLP   CHL IP FREQUENCY AND DURATION 10/18/2016 Speech Therapy Frequency (ACUTE ONLY) min 3x week Treatment Duration 2 weeks      CHL IP ORAL PHASE 10/18/2016 Oral Phase WFL Oral - Pudding Teaspoon -- Oral - Pudding Cup -- Oral - Honey Teaspoon -- Oral - Honey Cup -- Oral - Nectar Teaspoon -- Oral - Nectar Cup -- Oral - Nectar Straw -- Oral - Thin Teaspoon -- Oral - Thin Cup -- Oral - Thin Straw -- Oral - Puree -- Oral - Mech Soft -- Oral - Regular -- Oral - Multi-Consistency -- Oral - Pill -- Oral Phase - Comment --  CHL IP PHARYNGEAL PHASE 10/18/2016 Pharyngeal Phase Impaired Pharyngeal- Pudding Teaspoon -- Pharyngeal -- Pharyngeal- Pudding Cup -- Pharyngeal -- Pharyngeal- Honey Teaspoon -- Pharyngeal -- Pharyngeal- Honey Cup -- Pharyngeal -- Pharyngeal- Nectar Teaspoon -- Pharyngeal -- Pharyngeal- Nectar Cup Delayed swallow initiation-pyriform sinuses;Reduced epiglottic inversion;Reduced anterior laryngeal  mobility;Reduced laryngeal elevation;Reduced airway/laryngeal closure;Reduced tongue base retraction;Pharyngeal residue - valleculae;Pharyngeal residue - pyriform;Pharyngeal residue - cp segment Pharyngeal -- Pharyngeal- Nectar Straw Delayed swallow initiation-pyriform sinuses;Reduced epiglottic inversion;Reduced anterior laryngeal mobility;Reduced laryngeal elevation;Reduced airway/laryngeal closure;Reduced tongue base retraction;Penetration/Apiration after swallow;Trace aspiration;Pharyngeal residue - valleculae;Pharyngeal residue - pyriform;Pharyngeal residue - cp segment Pharyngeal Material enters airway, passes BELOW cords without attempt by patient to eject out (silent aspiration) Pharyngeal- Thin Teaspoon -- Pharyngeal -- Pharyngeal- Thin Cup Delayed swallow initiation-pyriform sinuses;Reduced epiglottic inversion;Reduced anterior laryngeal mobility;Reduced laryngeal elevation;Reduced airway/laryngeal closure;Reduced tongue base retraction;Penetration/Aspiration before swallow;Moderate aspiration;Pharyngeal residue - valleculae;Pharyngeal residue - pyriform;Pharyngeal residue - cp segment;Compensatory strategies attempted (with notebox) Pharyngeal Material enters airway, passes BELOW cords and not ejected out despite cough attempt by patient Pharyngeal- Thin Straw -- Pharyngeal -- Pharyngeal- Puree Delayed swallow initiation-vallecula;Reduced epiglottic inversion;Reduced anterior laryngeal mobility;Reduced laryngeal elevation;Reduced tongue base retraction;Pharyngeal residue - valleculae;Pharyngeal residue - pyriform;Pharyngeal residue - cp segment;Compensatory strategies attempted (with notebox) Pharyngeal -- Pharyngeal- Mechanical Soft -- Pharyngeal -- Pharyngeal- Regular Delayed swallow initiation-vallecula;Reduced epiglottic inversion;Reduced anterior laryngeal mobility;Reduced laryngeal elevation;Reduced tongue base retraction;Pharyngeal residue - valleculae;Pharyngeal residue - pyriform;Pharyngeal  residue - cp segment;Compensatory strategies attempted (with notebox) Pharyngeal -- Pharyngeal- Multi-consistency -- Pharyngeal -- Pharyngeal- Pill -- Pharyngeal -- Pharyngeal Comment head turn to R- somewhat effective in clearing residuals  CHL IP CERVICAL ESOPHAGEAL PHASE 10/18/2016 Cervical Esophageal Phase Impaired Pudding Teaspoon -- Pudding Cup -- Honey Teaspoon -- Honey Cup -- Nectar Teaspoon -- Nectar Cup Reduced cricopharyngeal relaxation Nectar Straw Reduced cricopharyngeal relaxation Thin Teaspoon -- Thin Cup Reduced cricopharyngeal relaxation Thin Straw -- Puree Reduced cricopharyngeal relaxation Mechanical Soft -- Regular Reduced cricopharyngeal relaxation Multi-consistency -- Pill -- Cervical Esophageal Comment -- No flowsheet data found. Amy Cecille Aver, MA, CCC-SLP 10/18/2016, 10:27 AM [phone]              Microbiology: No results found for this or any previous visit (from the past 240 hour(s)).   Labs: Basic Metabolic Panel:  Recent Labs Lab 10/16/16 0531 10/17/16 0520 10/18/16 0449 10/19/16 0330 10/20/16 0459  NA 132* 135 135 135 136  K 3.8 3.8 4.1 3.7 3.5  CL 92* 94* 93* 96*  97*  CO2 31 34* 34* 32 32  GLUCOSE 149* 122* 91 104* 101*  BUN 14 18 23* 26* 22*  CREATININE 0.88 0.84 1.06 1.10 1.10  CALCIUM 8.9 8.9 8.9 8.9 8.8*  MG  --   --  2.2  --   --    Liver Function Tests: No results for input(s): AST, ALT, ALKPHOS, BILITOT, PROT, ALBUMIN in the last 168 hours. No results for input(s): LIPASE, AMYLASE in the last 168 hours. No results for input(s): AMMONIA in the last 168 hours. CBC:  Recent Labs Lab 10/15/16 1258 10/18/16 0449  WBC 12.4* 14.4*  HGB 10.8* 10.7*  HCT 32.8* 33.6*  MCV 90.6 90.8  PLT 705* 778*   Cardiac Enzymes:  Recent Labs Lab 10/15/16 1817 10/16/16 0001 10/16/16 0531  TROPONINI 0.15* 0.04* 0.03*   BNP: BNP (last 3 results)  Recent Labs  10/15/16 1332  BNP 812.8*    ProBNP (last 3 results) No results for input(s): PROBNP in the  last 8760 hours.  CBG: No results for input(s): GLUCAP in the last 168 hours.     SignedAlbertine Grates MD, PhD  [facility] 10/20/2016, 12:20 PM

## 2016-10-20 LAB — BASIC METABOLIC PANEL
Anion gap: 7 (ref 5–15)
BUN: 22 mg/dL — AB (ref 6–20)
CALCIUM: 8.8 mg/dL — AB (ref 8.9–10.3)
CO2: 32 mmol/L (ref 22–32)
CREATININE: 1.1 mg/dL (ref 0.61–1.24)
Chloride: 97 mmol/L — ABNORMAL LOW (ref 101–111)
GFR calc Af Amer: >60 mL/min (ref >60)
GLUCOSE: 101 mg/dL — AB (ref 65–99)
Potassium: 3.5 mmol/L (ref 3.5–5.1)
SODIUM: 136 mmol/L (ref 135–145)

## 2016-10-20 MED ORDER — AMIODARONE HCL 200 MG PO TABS: 200.0000 mg | ORAL_TABLET | Freq: Every day | ORAL | Status: DC

## 2016-10-20 MED ORDER — ENSURE ENLIVE PO LIQD: 237.0000 mL | Freq: Two times a day (BID) | ORAL | 12 refills | Status: DC

## 2016-10-20 NOTE — Discharge Summary (Signed)
Pt got discharged to home, discharge instructions given and pt showed understanding towards it, IV taken off, Tele monitor DC, pt left hospital with all of his belongings accompanied with wife in a stable condition with home oxygen on @2l /min

## 2016-10-20 NOTE — Care Management Note (Signed)
Case Management Note  Patient Details  Name: Devin Barton MRN: [id number] Date of Birth: 1945/06/12  Subjective/Objective:                  Acute respiratory failure [facility]) Action/Plan: Discharge plannning Expected Discharge Date:  10/20/16               Expected Discharge Plan:  [place]  In-House Referral:     Discharge planning Services  CM Consult  Post Acute Care Choice:  Home Health Choice offered to:  Patient  DME Arranged:  Oxygen DME Agency:  [place] Arranged:  RN, Speech Therapy [place] Agency:  [place]  Status of Service:  Completed, signed off  If discussed at [place] of Stay Meetings, dates discussed:    Additional Comments: CM met with pt and pt's spouse in room to confirm they are active with [facility] for HHRN/SLP; family confirms.  Pt has a portable tank of 02 in room for transport home. CM notified Md to please place orders and face to face.  Cm has notified [facility] rep, Brad of resumption of care. No other CM needs were communicated. Dellie Catholic, RN 10/20/2016, 1:16 PM

## 2016-10-20 NOTE — Progress Notes (Signed)
Progress Note  Patient Name: Devin Barton Date of Encounter: 10/20/2016  Primary Cardiologist: branch  Subjective   Breathing is OK  No CP    Inpatient Medications    Scheduled Meds: . amiodarone  400 mg Oral Daily  . arformoterol  15 mcg Nebulization BID  . aspirin  325 mg Oral Daily  . atorvastatin  40 mg Oral q1800  . budesonide (PULMICORT) nebulizer solution  0.25 mg Nebulization BID  . enoxaparin (LOVENOX) injection  30 mg Subcutaneous Q24H  . feeding supplement (ENSURE ENLIVE)  237 mL Oral BID BM  . furosemide  80 mg Oral Daily  . metoprolol tartrate  12.5 mg Oral BID  . nicotine  21 mg Transdermal Daily  . pantoprazole  40 mg Oral Daily  . potassium chloride SA  20 mEq Oral Daily  . sodium chloride flush  3 mL Intravenous Q12H  . tamsulosin  0.4 mg Oral QPM  . traZODone  50 mg Oral QHS   Continuous Infusions: . sodium chloride     PRN Meds: sodium chloride, acetaminophen, levalbuterol **AND** ipratropium, ondansetron (ZOFRAN) IV, RESOURCE THICKENUP CLEAR, sodium chloride flush, zolpidem   Vital Signs    Vitals:   10/20/16 0325 10/20/16 0331 10/20/16 0940 10/20/16 0943  BP: 118/64  117/62   Pulse: 65  66   Resp: 20     Temp: 98.4 F (36.9 C)     TempSrc: Oral     SpO2:    97%  Weight:  117 lb (53.1 kg)    Height:        Intake/Output Summary (Last 24 hours) at 10/20/16 1013 Last data filed at 10/20/16 0928  Gross per 24 hour  Intake              480 ml  Output              450 ml  Net               30 ml   Filed Weights   10/18/16 0447 10/19/16 0113 10/20/16 0331  Weight: 121 lb 3.2 oz (55 kg) 116 lb 9.6 oz (52.9 kg) 117 lb (53.1 kg)    Telemetry    SR   - Personally Reviewed  ECG     Physical Exam  Thin 71 yo in nad   GEN: No acute distress.   Neck: No JVD Cardiac: RRR, no murmurs, rubs, or gallops.  Respiratory: decreased airflow  No rales   GI: Soft, nontender, non-distended  MS: No edema; No deformity.  Mild erythema on R  calf   Neuro:  Nonfocal  Psych: Normal affect   Labs    Chemistry Recent Labs Lab 10/18/16 0449 10/19/16 0330 10/20/16 0459  NA 135 135 136  K 4.1 3.7 3.5  CL 93* 96* 97*  CO2 34* 32 32  GLUCOSE 91 104* 101*  BUN 23* 26* 22*  CREATININE 1.06 1.10 1.10  CALCIUM 8.9 8.9 8.8*  GFRNONAA >60 >60 >60  GFRAA >60 >60 >60  ANIONGAP 8 7 7      Hematology Recent Labs Lab 10/15/16 1258 10/18/16 0449  WBC 12.4* 14.4*  RBC 3.62* 3.70*  HGB 10.8* 10.7*  HCT 32.8* 33.6*  MCV 90.6 90.8  MCH 29.8 28.9  MCHC 32.9 31.8  RDW 14.9 15.0  PLT 705* 778*    Cardiac Enzymes Recent Labs Lab 10/15/16 1817 10/16/16 0001 10/16/16 0531  TROPONINI 0.15* 0.04* 0.03*    Recent Labs Lab 10/15/16  1306  TROPIPOC 0.02     BNP Recent Labs Lab 10/15/16 1332  BNP 812.8*     DDimer No results for input(s): DDIMER in the last 168 hours.   Radiology    Dg Esophagus  Result Date: 10/19/2016 CLINICAL DATA:  Dysphasia. EXAM: ESOPHOGRAM/BARIUM SWALLOW TECHNIQUE: Single contrast examination was performed using nectar thick barium. FLUOROSCOPY TIME:  Fluoroscopy Time:  48 seconds Radiation Exposure Index (if provided by the fluoroscopic device): na Number of Acquired Spot Images: 0 COMPARISON:  10/15/2016 FINDINGS: Examination was performed with the patient in the upright orientation only. The esophagus is patent. There is no stricture or mass identified. The motility of the esophagus appears within normal limits. No hiatal hernia or reflux identified. IMPRESSION: 1. Patent esophagus.  No stricture mass visualized. Electronically Signed   By: Signa Kellaylor  [place] M.D.   On: 10/19/2016 10:39    Cardiac Studies     Patient Profile     71 y.o.   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

## 2016-10-21 ENCOUNTER — Encounter (HOSPITAL_COMMUNITY): Payer: Self-pay | Admitting: Emergency Medicine

## 2016-10-21 ENCOUNTER — Telehealth: Payer: Self-pay | Admitting: Internal Medicine

## 2016-10-21 ENCOUNTER — Emergency Department (HOSPITAL_COMMUNITY)
Admission: EM | Admit: 2016-10-21 | Discharge: 2016-10-21 | Disposition: A | Discharge status: Home / Self Care | Payer: Medicare HMO | Attending: Emergency Medicine | Admitting: Emergency Medicine

## 2016-10-21 DIAGNOSIS — I2510 Atherosclerotic heart disease of native coronary artery without angina pectoris: Secondary | ICD-10-CM | POA: Diagnosis not present

## 2016-10-21 DIAGNOSIS — Z79899 Other long term (current) drug therapy: Secondary | ICD-10-CM | POA: Diagnosis not present

## 2016-10-21 DIAGNOSIS — I110 Hypertensive heart disease with heart failure: Secondary | ICD-10-CM | POA: Diagnosis not present

## 2016-10-21 DIAGNOSIS — J449 Chronic obstructive pulmonary disease, unspecified: Secondary | ICD-10-CM | POA: Diagnosis not present

## 2016-10-21 DIAGNOSIS — J45909 Unspecified asthma, uncomplicated: Secondary | ICD-10-CM | POA: Diagnosis not present

## 2016-10-21 DIAGNOSIS — I5042 Chronic combined systolic (congestive) and diastolic (congestive) heart failure: Secondary | ICD-10-CM | POA: Insufficient documentation

## 2016-10-21 DIAGNOSIS — F17210 Nicotine dependence, cigarettes, uncomplicated: Secondary | ICD-10-CM | POA: Insufficient documentation

## 2016-10-21 DIAGNOSIS — Z7982 Long term (current) use of aspirin: Secondary | ICD-10-CM | POA: Diagnosis not present

## 2016-10-21 DIAGNOSIS — R339 Retention of urine, unspecified: Secondary | ICD-10-CM | POA: Diagnosis not present

## 2016-10-21 DIAGNOSIS — R69 Illness, unspecified: Secondary | ICD-10-CM | POA: Diagnosis not present

## 2016-10-21 DIAGNOSIS — R9431 Abnormal electrocardiogram [ECG] [EKG]: Secondary | ICD-10-CM | POA: Diagnosis not present

## 2016-10-21 LAB — URINALYSIS, ROUTINE W REFLEX MICROSCOPIC
Bacteria, UA: NONE SEEN
Bilirubin Urine: NEGATIVE
GLUCOSE, UA: NEGATIVE mg/dL
KETONES UR: NEGATIVE mg/dL
Leukocytes, UA: NEGATIVE
NITRITE: NEGATIVE
PH: 7 (ref 5.0–8.0)
Protein, ur: NEGATIVE mg/dL
Specific Gravity, Urine: 1.011 (ref 1.005–1.030)
Squamous Epithelial / LPF: NONE SEEN

## 2016-10-21 NOTE — ED Provider Notes (Signed)
[facility]-EMERGENCY DEPT Provider Note   CSN: [id number] Arrival date & time: 10/21/16  2006     History   Chief Complaint Chief Complaint  Patient presents with  . Urinary Retention    HPI Devin Barton is a 71 y.o. male.  HPI  71 year old male is discharged hospital yesterday where he was admitted for fluid overload. No problems until later this afternoon when he had the urge to urinate could all urinary little bit and since that time has not urinated all. He comes in with suprapubic pain. No new back pain. No worsening chest pain, source of breath or other symptoms at this time. Patient states he has a history of having had had a Foley front and clear reasons on Flomax at work sometimes and sometimes it doesn't. Patient is slightly tachypneic but states that this is his baseline and from respiratory status he would not be here but for the urinary retention.  Past Medical History:  Diagnosis Date  . Asthma   . Cigarette smoker   . COPD (chronic obstructive pulmonary disease) (HCC)   . GERD (gastroesophageal reflux disease)     Patient Active Problem List   Diagnosis Date Noted  . S/P CABG (coronary artery bypass graft)   . PAF (paroxysmal atrial fibrillation) (HCC)   . Acute on chronic combined systolic and diastolic CHF (congestive heart failure) (HCC)   . Acute respiratory failure (HCC) 10/15/2016  . Coronary artery disease 10/05/2016  . CAD (coronary artery disease) 10/04/2016  . Acute systolic heart failure (HCC)   . Precordial chest pain 09/13/2016  . Abnormal EKG 09/13/2016  . Unintentional weight loss 06/06/2012  . COPD exacerbation (HCC) 06/06/2012  . Hyperglycemia, drug-induced 06/06/2012  . CIGARETTE SMOKER 08/15/2007  . HYPERTENSION, BENIGN 08/15/2007  . COPD 07/22/2007    Past Surgical History:  Procedure Laterality Date  . ABDOMINAL HERNIA REPAIR    . BOWEL RESECTION     Bowel perf secondary to trauma  . CARDIAC CATHETERIZATION    . CORONARY ARTERY  BYPASS GRAFT N/A 10/05/2016   Procedure: CORONARY ARTERY BYPASS GRAFTING (CABG) x4 with FREE MAMMARY.  ENDOSCOPIC HARVESTING OF RIGHT SAPHENOUS VEIN. (FREE LIMA to LAD, SVG to OM, SVG SEQUENTIALLY to ACUTE MARGINAL and PDA);  Surgeon: Loreli SlotHendrickson, Steven C, MD;  [facility];  Service: Open Heart Surgery;  Laterality: N/A;  . EXPLORATORY LAPAROTOMY  1986   [name]/notes 08/08/2010  . FOOT FRACTURE SURGERY Left ~ 1965  . FRACTURE SURGERY    . HERNIA REPAIR    . INCISIONAL HERNIA REPAIR  05/2005   [name]/notes 08/08/2010  . INGUINAL HERNIA REPAIR Bilateral   . INGUINAL HERNIA REPAIR Right 09/2006   recurrent/notes 07/27/2010  . ORIF FOOT FRACTURE Left 1990   [name]/notes 08/08/2010  . RIGHT/LEFT HEART CATH AND CORONARY ANGIOGRAPHY N/A 10/04/2016   Procedure: Right/Left Heart Cath and Coronary Angiography;  Surgeon: Corky CraftsVaranasi, Jayadeep S, MD;  [facility];  Service: Cardiovascular;  Laterality: N/A;  . TEE WITHOUT CARDIOVERSION N/A 10/05/2016   Procedure: TRANSESOPHAGEAL ECHOCARDIOGRAM (TEE);  Surgeon: Loreli SlotHendrickson, Steven C, MD;  [facility];  Service: Open Heart Surgery;  Laterality: N/A;  . VENTRAL HERNIA REPAIR  09/2006   recurrent/notes 07/27/2010       Home Medications    Prior to Admission medications   Medication Sig Start Date End Date Taking? Authorizing Provider  amiodarone (PACERONE) 200 MG tablet Take 1 tablet (200 mg total) by mouth daily. 10/21/16 11/20/16  Albertine GratesXu, Fang, MD  arformoterol (BROVANA) 15 MCG/2ML NEBU Take 2 mLs (15 mcg total) by nebulization 2 (two) times daily. 10/19/16   Albertine GratesXu, Fang, MD  aspirin EC 325 MG EC tablet Take 1 tablet (325 mg total) by mouth daily. 10/13/16   Sharlene Doryonte, Tessa N, PA-C  atorvastatin (LIPITOR) 40 MG tablet Take 1 tablet (40 mg total) by mouth daily at 6 PM. 10/19/16   Albertine GratesXu, Fang, MD  budesonide (PULMICORT) 0.25 MG/2ML nebulizer solution Take 2 mLs (0.25 mg total) by nebulization 2 (two) times daily. 10/19/16   Albertine GratesXu, Fang, MD  feeding supplement, ENSURE  ENLIVE, (ENSURE ENLIVE) LIQD Take 237 mLs by mouth 2 (two) times daily between meals. 10/20/16   Albertine GratesXu, Fang, MD  furosemide (LASIX) 40 MG tablet Take 2 tablets (80 mg total) by mouth daily. 10/19/16   Albertine GratesXu, Fang, MD  ipratropium (ATROVENT) 0.02 % nebulizer solution Take 0.5 mg by nebulization daily as needed for wheezing or shortness of breath.  08/28/16   [provider]  levalbuterol Pauline Aus(XOPENEX) 0.63 MG/3ML nebulizer solution Take 3 mLs (0.63 mg total) by nebulization every 6 (six) hours as needed for wheezing or shortness of breath. 10/19/16   Albertine GratesXu, Fang, MD  Maltodextrin-Xanthan Gum (RESOURCE [facility] CLEAR) POWD As needed for a month supple 10/19/16   Albertine GratesXu, Fang, MD  metoprolol tartrate (LOPRESSOR) 25 MG tablet Take 0.5 tablets (12.5 mg total) by mouth 2 (two) times daily. 10/12/16   Sharlene Doryonte, Tessa N, PA-C  omeprazole (PRILOSEC) 40 MG capsule Take 40 mg by mouth daily.    [provider]  potassium chloride SA (K-DUR,KLOR-CON) 20 MEQ tablet Take 1 tablet (20 mEq total) by mouth daily. 10/13/16   Sharlene Doryonte, Tessa N, PA-C  tamsulosin (FLOMAX) 0.4 MG CAPS Take 0.4 mg by mouth every evening.    [provider]  traMADol (ULTRAM) 50 MG tablet Take 1 tablet (50 mg total) by mouth every 6 (six) hours as needed for moderate pain. 10/12/16   Sharlene Doryonte, Tessa N, PA-C    Family History Family History  Problem Relation Age of Onset  . Heart disease Mother        No details  . Alzheimer's disease Mother   . Atopy Neg Hx     Social History Social History  Substance Use Topics  . Smoking status: Current Every Day Smoker    Packs/day: 1.00    Years: 55.00    Types: Cigarettes  . Smokeless tobacco: Never Used  . Alcohol use No     Allergies   No known allergies   Review of Systems Review of Systems  All other systems reviewed and are negative.    Physical Exam Updated Vital Signs BP 136/81 (BP Location: Right Arm)   Pulse 82   Temp 98.1 F (36.7 C) (Oral)   Resp (!) 24   SpO2  100%   Physical Exam  Constitutional: He appears well-developed and well-nourished.  HENT:  Head: Normocephalic and atraumatic.  Eyes: Conjunctivae and EOM are normal.  Neck: Normal range of motion.  Cardiovascular: Normal rate.   Pulmonary/Chest: Effort normal. Tachypnea noted. No respiratory distress. He has rales.  Abdominal: Soft. He exhibits no distension. There is tenderness (suprapubic).  Musculoskeletal: Normal range of motion.  Neurological: He is alert.  Skin: Skin is warm and dry.  Nursing note and vitals reviewed.    ED Treatments / Results  Labs (all labs ordered are listed, but only abnormal results are displayed) Labs Reviewed  URINALYSIS, ROUTINE W REFLEX MICROSCOPIC - Abnormal; Notable for  the following:       Result Value   Hgb urine dipstick MODERATE (*)    All other components within normal limits    EKG  EKG Interpretation None       Radiology No results found.  Procedures Procedures (including critical care time)  Medications Ordered in ED Medications - No data to display   Initial Impression / Assessment and Plan / ED Course  I have reviewed the triage vital signs and the nursing notes.  Pertinent labs & imaging results that were available during my care of the patient were reviewed by me and considered in my medical decision making (see chart for details).    Urinary retention likely 2/2 BPH. Will reeal after foley. UA as well.  UA negative. Significant improvement in symptoms after catheter placed. Once again patient and family both state that he is at his baseline breathing status. We'll have him follow-up with urology for further catheter care.  Final Clinical Impressions(s) / ED Diagnoses   Final diagnoses:  Urinary retention    New Prescriptions Discharge Medication List as of 10/21/2016  9:54 PM       Konner Saiz, Barbara Cower, MD 10/22/16 1610

## 2016-10-21 NOTE — ED Notes (Signed)
Patient Alert and oriented X4. Stable and ambulatory. Patient verbalized understanding of the discharge instructions.  Patient belongings were taken by the patient.  

## 2016-10-21 NOTE — ED Triage Notes (Signed)
Patient arrives with complaint of urinary retention. States onset today. Was able to void a small amount today around noon, but unable to empty bladder. Denies other unusual complaints. In triage patient appear comfortable and states he is at baseline otherwise. Tachpneic, on oxygen, with wet sounding cough and breathing. Patient states his respiratory effort feels normal and that he usually has a wet cough. Endorses use of nebulizer treatments daily at home. Speaking in full sentences. Recent open heart surgery with apparent incision site over sternum. Site appears well.

## 2016-10-21 NOTE — Telephone Encounter (Signed)
Returned page from patient's wife. Patient Devin Barton is having pain with urination. States she is already on her way to bring him to the ED.

## 2016-10-22 DIAGNOSIS — Z7951 Long term (current) use of inhaled steroids: Secondary | ICD-10-CM | POA: Diagnosis not present

## 2016-10-22 DIAGNOSIS — I2510 Atherosclerotic heart disease of native coronary artery without angina pectoris: Secondary | ICD-10-CM | POA: Diagnosis not present

## 2016-10-22 DIAGNOSIS — R1310 Dysphagia, unspecified: Secondary | ICD-10-CM | POA: Diagnosis not present

## 2016-10-22 DIAGNOSIS — R69 Illness, unspecified: Secondary | ICD-10-CM | POA: Diagnosis not present

## 2016-10-22 DIAGNOSIS — I110 Hypertensive heart disease with heart failure: Secondary | ICD-10-CM | POA: Diagnosis not present

## 2016-10-22 DIAGNOSIS — Z7982 Long term (current) use of aspirin: Secondary | ICD-10-CM | POA: Diagnosis not present

## 2016-10-22 DIAGNOSIS — Z48812 Encounter for surgical aftercare following surgery on the circulatory system: Secondary | ICD-10-CM | POA: Diagnosis not present

## 2016-10-22 DIAGNOSIS — J441 Chronic obstructive pulmonary disease with (acute) exacerbation: Secondary | ICD-10-CM | POA: Diagnosis not present

## 2016-10-22 DIAGNOSIS — I5022 Chronic systolic (congestive) heart failure: Secondary | ICD-10-CM | POA: Diagnosis not present

## 2016-10-22 DIAGNOSIS — Z951 Presence of aortocoronary bypass graft: Secondary | ICD-10-CM | POA: Diagnosis not present

## 2016-10-23 DIAGNOSIS — J441 Chronic obstructive pulmonary disease with (acute) exacerbation: Secondary | ICD-10-CM | POA: Diagnosis not present

## 2016-10-23 DIAGNOSIS — I10 Essential (primary) hypertension: Secondary | ICD-10-CM | POA: Diagnosis not present

## 2016-10-23 DIAGNOSIS — N401 Enlarged prostate with lower urinary tract symptoms: Secondary | ICD-10-CM | POA: Diagnosis not present

## 2016-10-23 DIAGNOSIS — Z48812 Encounter for surgical aftercare following surgery on the circulatory system: Secondary | ICD-10-CM | POA: Diagnosis not present

## 2016-10-23 DIAGNOSIS — Z7982 Long term (current) use of aspirin: Secondary | ICD-10-CM | POA: Diagnosis not present

## 2016-10-23 DIAGNOSIS — R69 Illness, unspecified: Secondary | ICD-10-CM | POA: Diagnosis not present

## 2016-10-23 DIAGNOSIS — R1310 Dysphagia, unspecified: Secondary | ICD-10-CM | POA: Diagnosis not present

## 2016-10-23 DIAGNOSIS — I110 Hypertensive heart disease with heart failure: Secondary | ICD-10-CM | POA: Diagnosis not present

## 2016-10-23 DIAGNOSIS — Z7951 Long term (current) use of inhaled steroids: Secondary | ICD-10-CM | POA: Diagnosis not present

## 2016-10-23 DIAGNOSIS — I2510 Atherosclerotic heart disease of native coronary artery without angina pectoris: Secondary | ICD-10-CM | POA: Diagnosis not present

## 2016-10-23 DIAGNOSIS — I5022 Chronic systolic (congestive) heart failure: Secondary | ICD-10-CM | POA: Diagnosis not present

## 2016-10-23 DIAGNOSIS — Z951 Presence of aortocoronary bypass graft: Secondary | ICD-10-CM | POA: Diagnosis not present

## 2016-10-24 DIAGNOSIS — I509 Heart failure, unspecified: Secondary | ICD-10-CM | POA: Diagnosis not present

## 2016-10-24 DIAGNOSIS — I2510 Atherosclerotic heart disease of native coronary artery without angina pectoris: Secondary | ICD-10-CM | POA: Diagnosis not present

## 2016-10-24 DIAGNOSIS — I5020 Unspecified systolic (congestive) heart failure: Secondary | ICD-10-CM | POA: Diagnosis not present

## 2016-10-24 DIAGNOSIS — I25810 Atherosclerosis of coronary artery bypass graft(s) without angina pectoris: Secondary | ICD-10-CM | POA: Diagnosis not present

## 2016-10-24 DIAGNOSIS — Z681 Body mass index (BMI) 19 or less, adult: Secondary | ICD-10-CM | POA: Diagnosis not present

## 2016-10-25 ENCOUNTER — Encounter: Payer: Self-pay | Admitting: Cardiology

## 2016-10-25 ENCOUNTER — Ambulatory Visit (INDEPENDENT_AMBULATORY_CARE_PROVIDER_SITE_OTHER): Payer: Medicare HMO | Admitting: Cardiology

## 2016-10-25 VITALS — BP 108/66 | HR 73 | Ht 68.0 in | Wt 116.0 lb

## 2016-10-25 DIAGNOSIS — I9789 Other postprocedural complications and disorders of the circulatory system, not elsewhere classified: Secondary | ICD-10-CM

## 2016-10-25 DIAGNOSIS — J441 Chronic obstructive pulmonary disease with (acute) exacerbation: Secondary | ICD-10-CM | POA: Diagnosis not present

## 2016-10-25 DIAGNOSIS — I110 Hypertensive heart disease with heart failure: Secondary | ICD-10-CM | POA: Diagnosis not present

## 2016-10-25 DIAGNOSIS — R69 Illness, unspecified: Secondary | ICD-10-CM | POA: Diagnosis not present

## 2016-10-25 DIAGNOSIS — R1310 Dysphagia, unspecified: Secondary | ICD-10-CM | POA: Diagnosis not present

## 2016-10-25 DIAGNOSIS — Z7982 Long term (current) use of aspirin: Secondary | ICD-10-CM | POA: Diagnosis not present

## 2016-10-25 DIAGNOSIS — I5022 Chronic systolic (congestive) heart failure: Secondary | ICD-10-CM | POA: Diagnosis not present

## 2016-10-25 DIAGNOSIS — I4891 Unspecified atrial fibrillation: Secondary | ICD-10-CM | POA: Diagnosis not present

## 2016-10-25 DIAGNOSIS — Z48812 Encounter for surgical aftercare following surgery on the circulatory system: Secondary | ICD-10-CM | POA: Diagnosis not present

## 2016-10-25 DIAGNOSIS — I2510 Atherosclerotic heart disease of native coronary artery without angina pectoris: Secondary | ICD-10-CM

## 2016-10-25 DIAGNOSIS — Z7951 Long term (current) use of inhaled steroids: Secondary | ICD-10-CM | POA: Diagnosis not present

## 2016-10-25 DIAGNOSIS — Z951 Presence of aortocoronary bypass graft: Secondary | ICD-10-CM | POA: Diagnosis not present

## 2016-10-25 MED ORDER — POTASSIUM CHLORIDE CRYS ER 20 MEQ PO TBCR: 20.0000 meq | EXTENDED_RELEASE_TABLET | Freq: Every day | ORAL | 3 refills | Status: DC

## 2016-10-25 MED ORDER — FUROSEMIDE 40 MG PO TABS: ORAL_TABLET | ORAL | 3 refills | Status: DC

## 2016-10-25 NOTE — Progress Notes (Signed)
Clinical Summary Devin Barton is a 71 y.o.male seen today for follow up of the following medical problems.   1. CAD/ chronic systolic HF - [facility] - EKG with LBBB thought to be chronic - treated for COPD exacerbation with plans for outpatient cardiology evaluation - seen by Dr Antoine PocheHochrein, [facility] 08/2016 Lexiscan: large area of scar as reported below, no significant ischemia. LVEF 30-44%. High risk due to low LVEF and scar - 08/2016: LVEF 25-30%, severe hypokinesis of the anteroseptal and apical myocardium. No definite LV mural thrombus but images are somewhat limited, could consider aDefinity contrast study.  - last visit referred for cath. 09/2016 cath report below, patient with severe 3 vessel CAD. Referred for CABG - CABG 10/05/16 with LIMA-LAD,SVG-OM1, SVG-acute marginal and sequential to PDA).   - admitted late July with acute on chronic systolic HF. Diuresed roughly 4 liters, discharge weight 117 lbs - stable SOB. No recent LE edmea.  - pressure like pain midchest, 3-4/10. Not positional. Lasts about 30 minutes. 3-4 times a night. Better with breathing treatments - compliant with meds. Has been on lasix 80mg  day. Home weights down to 114 lbs, down from 117lbs at discharge.  -reports Toprol changed back to lopressor. I suspect due to hemodynamics during recent surgery  2. COPD - followed by pcp - last seen by Dr Sherene SiresWert 07/2007. Notes mention severe COPD. Had been on advair but no longer taking, only has rescue inhaler - followed by Dr Juanetta GoslingHawkins  3. Leg swelling - 10/16/16 with superficial thrombus at site of vein harvest, no DVT  4. Postop afib - occurred after CABG. Started on IV amio and then oral amio - no recent palpitations. Not committed to anticoag at this time  5. Urinary retention - followed by urology Past Medical History:  Diagnosis Date  . Asthma   . Cigarette smoker   . COPD (chronic obstructive pulmonary disease)  (HCC)   . GERD (gastroesophageal reflux disease)      Allergies  Allergen Reactions  . No Known Allergies      Current Outpatient Prescriptions  Medication Sig Dispense Refill  . amiodarone (PACERONE) 200 MG tablet Take 1 tablet (200 mg total) by mouth daily. 30 tablet 0  . arformoterol (BROVANA) 15 MCG/2ML NEBU Take 2 mLs (15 mcg total) by nebulization 2 (two) times daily. 120 mL 0  . aspirin EC 325 MG EC tablet Take 1 tablet (325 mg total) by mouth daily. 30 tablet 0  . atorvastatin (LIPITOR) 40 MG tablet Take 1 tablet (40 mg total) by mouth daily at 6 PM. 30 tablet 0  . budesonide (PULMICORT) 0.25 MG/2ML nebulizer solution Take 2 mLs (0.25 mg total) by nebulization 2 (two) times daily. 60 mL 12  . feeding supplement, ENSURE ENLIVE, (ENSURE ENLIVE) LIQD Take 237 mLs by mouth 2 (two) times daily between meals. 237 mL 12  . furosemide (LASIX) 40 MG tablet Take 2 tablets (80 mg total) by mouth daily. 30 tablet 0  . ipratropium (ATROVENT) 0.02 % nebulizer solution Take 0.5 mg by nebulization daily as needed for wheezing or shortness of breath.     . levalbuterol (XOPENEX) 0.63 MG/3ML nebulizer solution Take 3 mLs (0.63 mg total) by nebulization every 6 (six) hours as needed for wheezing or shortness of breath. 3 mL 12  . Maltodextrin-Xanthan Gum (RESOURCE THICKENUP CLEAR) POWD As needed for a month supple 5 Can 0  . metoprolol tartrate (LOPRESSOR) 25 MG tablet  Take 0.5 tablets (12.5 mg total) by mouth 2 (two) times daily. 30 tablet 1  . omeprazole (PRILOSEC) 40 MG capsule Take 40 mg by mouth daily.    . potassium chloride SA (K-DUR,KLOR-CON) 20 MEQ tablet Take 1 tablet (20 mEq total) by mouth daily. 7 tablet 0  . tamsulosin (FLOMAX) 0.4 MG CAPS Take 0.4 mg by mouth every evening.    . traMADol (ULTRAM) 50 MG tablet Take 1 tablet (50 mg total) by mouth every 6 (six) hours as needed for moderate pain. 30 tablet 0   No current facility-administered medications for this visit.      Past  Surgical History:  Procedure Laterality Date  . ABDOMINAL HERNIA REPAIR    . BOWEL RESECTION     Bowel perf secondary to trauma  . CARDIAC CATHETERIZATION    . CORONARY ARTERY BYPASS GRAFT N/A 10/05/2016   Procedure: CORONARY ARTERY BYPASS GRAFTING (CABG) x4 with FREE MAMMARY.  ENDOSCOPIC HARVESTING OF RIGHT SAPHENOUS VEIN. (FREE LIMA to LAD, SVG to OM, SVG SEQUENTIALLY to ACUTE MARGINAL and PDA);  Surgeon: Loreli Slot, MD;  Location: [facility] OR;  Service: Open Heart Surgery;  Laterality: N/A;  . EXPLORATORY LAPAROTOMY  1986   [name] 08/08/2010  . FOOT FRACTURE SURGERY Left ~ 1965  . FRACTURE SURGERY    . HERNIA REPAIR    . INCISIONAL HERNIA REPAIR  05/2005   [name] 08/08/2010  . INGUINAL HERNIA REPAIR Bilateral   . INGUINAL HERNIA REPAIR Right 09/2006   recurrent/notes 07/27/2010  . ORIF FOOT FRACTURE Left 1990   [name] 08/08/2010  . RIGHT/LEFT HEART CATH AND CORONARY ANGIOGRAPHY N/A 10/04/2016   Procedure: Right/Left Heart Cath and Coronary Angiography;  Surgeon: Corky Crafts, MD;  Location: [facility] INVASIVE CV LAB;  Service: Cardiovascular;  Laterality: N/A;  . TEE WITHOUT CARDIOVERSION N/A 10/05/2016   Procedure: TRANSESOPHAGEAL ECHOCARDIOGRAM (TEE);  Surgeon: Loreli Slot, MD;  Location: [facility] OR;  Service: Open Heart Surgery;  Laterality: N/A;  . VENTRAL HERNIA REPAIR  09/2006   recurrent/notes 07/27/2010     Allergies  Allergen Reactions  . No Known Allergies       Family History  Problem Relation Age of Onset  . Heart disease Mother        No details  . Alzheimer's disease Mother   . Atopy Neg Hx      Social History Devin Barton reports that he has been smoking Cigarettes.  He has a 55.00 pack-year smoking history. He has never used smokeless tobacco. Devin Barton reports that he does not drink alcohol.   Review of Systems CONSTITUTIONAL: No weight loss, fever, chills, weakness or fatigue.  HEENT: Eyes: No visual loss, blurred vision, double vision or  yellow sclerae.No hearing loss, sneezing, congestion, runny nose or sore throat.  SKIN: No rash or itching.  CARDIOVASCULAR:per hpi  RESPIRATORY: No shortness of breath, cough or sputum.  GASTROINTESTINAL: No anorexia, nausea, vomiting or diarrhea. No abdominal pain or blood.  GENITOURINARY: No burning on urination, no polyuria NEUROLOGICAL: No headache, dizziness, syncope, paralysis, ataxia, numbness or tingling in the extremities. No change in bowel or bladder control.  MUSCULOSKELETAL: No muscle, back pain, joint pain or stiffness.  LYMPHATICS: No enlarged nodes. No history of splenectomy.  PSYCHIATRIC: No history of depression or anxiety.  ENDOCRINOLOGIC: No reports of sweating, cold or heat intolerance. No polyuria or polydipsia.  Marland Kitchen   Physical Examination Vitals:   10/25/16 1349  BP: 108/66  Pulse: 73   Vitals:  10/25/16 1349  Weight: 116 lb (52.6 kg)  Height: 5\' 8"  (1.727 m)    Gen: resting comfortably, no acute distress HEENT: no scleral icterus, pupils equal round and reactive, no palptable cervical adenopathy,  CV: RRR, no m/r/g, no jvd Resp: Clear to auscultation bilaterally GI: abdomen is soft, non-tender, non-distended, normal bowel sounds, no hepatosplenomegaly MSK: extremities are warm, no edema.  Skin: warm, no rash Neuro:  no focal deficits Psych: appropriate affect   Diagnostic Studies 08/2016 Lexiscan MPI  There was no ST segment deviation noted during stress.  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 High risk based on large scar burden and decreased LVEF. There is no significant myocardium currently at jeopardy. Consider correlating LVEF with echo.  The left ventricular ejection fraction is moderately decreased (30-44%).   08/2016 echo Study Conclusions  - Left  ventricle: The cavity size was normal. Wall thickness was increased in a pattern of mild LVH. Systolic function was severely reduced. The estimated ejection fraction was in the range of 25% to 30%. Abnormal global longitudinal strain of -11.3%. Diffuse hypokinesis. There is severe hypokinesis of the anteroseptal and apical myocardium. Doppler parameters are consistent with abnormal left ventricular relaxation (grade 1 diastolic dysfunction). - Ventricular septum: Septal motion showed abnormal function and dyssynergy. - Aortic valve: Mildly calcified annulus. Trileaflet; mildly calcified leaflets. Valve area (Vmax): 1.89 cm^2. - Mitral valve: Calcified annulus. Mildly thickened leaflets . There was mild regurgitation. - Right atrium: Central venous pressure (est): 3 mm Hg. - Atrial septum: No defect or patent foramen ovale was identified. - Tricuspid valve: There was trivial regurgitation. - Pulmonary arteries: PA peak pressure: 19 mm Hg (S). - Pericardium, extracardiac: There was no pericardial effusion.  Impressions:  - Mild LVH with LVEF approximately 25-30%. There is diffuse hypokinesis with septal dyssynergy and hypokinesis of the anteroseptal and apical myocardium. 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 Grade 1 diastolic dysfunction. No definite LV mural thrombus but images are somewhat limited, could consider a Definity contrast study. Mildly calcified mitral annulus with mildly thickened leaflets and mild mitral regurgitation. Mildly sclerotic aortic valve. Trivial tricuspid regurgitation with PASP estimated 19 mmHg.  09/2016 cath  Prox RCA lesion, 100 %stenosed.  LM lesion, 75 %stenosed.  Ost 1st Mrg lesion, 75 %stenosed.  Ost LAD to Prox LAD lesion, 90 %stenosed.  Mid LAD lesion, 75 %stenosed.  LV end diastolic pressure is low.  There is no aortic valve stenosis.  LV end diastolic pressure is normal.  Normal PA pressures. Ao Sat  90%. PA sat 61%. CO 4.2 L.min; CI 2.4  Right radial loop.   Severe three vessel CAD.  Known LV dysfunction from noninvasive testing, but well compensated.  Due to high risk anatomy, will admit the patient and start IV heparin.  Plan for cardiac surgery consult.   09/2016 TEE  Left ventricle: Normal left ventricular diastolic function and left atrial pressure. Cavity is mildly dilated. Thin-walled ventricle. LV systolic function is moderately reduced with an EF of 35-40%. Wall motion is abnormal. Mid, anteroseptal wall motion is dyskinetic.  Septum: Abnormal ventricular septal motion consistent with post-operative state. Small membranous ventricular septal defect present with left to right shunting.  Aortic valve: The valve is trileaflet. Mild valve thickening present. Mild valve calcification present. Trace regurgitation.  Mitral valve: Mild regurgitation.  Tricuspid valve: Mild regurgitation. The tricuspid valve regurgitation  jet is central.  Assessment and Plan  1. CAD/chronic systolic HF/ICM - we will change lopressor to Toprol XL in setting of systolic dysfunction - medication titration limited by soft bp's - we will decrease lasix to 40mg  dialy, may take additional 40mg  as needed for weight gain or swelling  2. Postop afib - we will continue amio at this time, likely d/c in near future.     F/u 3-4 weeks  Antoine Poche, M.D.

## 2016-10-25 NOTE — Patient Instructions (Signed)
Medication Instructions:  DECREASE LASIX TO 40 MG DAILY. ( MAY TAKE AN ADDITIONAL 40 MG AS NEEDED FOR WEIGHT GAIN AND SWELLING)   Labwork: NONE  Testing/Procedures: NONE  Follow-Up: Your physician recommends that you schedule a follow-up appointment in: 3-4 WEEKS   Any Other Special Instructions Will Be Listed Below (If Applicable).     If you need a refill on your cardiac medications before your next appointment, please call your pharmacy.

## 2016-10-26 ENCOUNTER — Ambulatory Visit (INDEPENDENT_AMBULATORY_CARE_PROVIDER_SITE_OTHER): Payer: Medicare HMO | Admitting: Urology

## 2016-10-26 DIAGNOSIS — R338 Other retention of urine: Secondary | ICD-10-CM

## 2016-10-26 DIAGNOSIS — N401 Enlarged prostate with lower urinary tract symptoms: Secondary | ICD-10-CM

## 2016-10-30 ENCOUNTER — Telehealth: Payer: Self-pay | Admitting: *Deleted

## 2016-10-30 MED ORDER — METOPROLOL SUCCINATE ER 25 MG PO TB24: 25.0000 mg | ORAL_TABLET | Freq: Every day | ORAL | 3 refills | Status: DC

## 2016-10-30 NOTE — Telephone Encounter (Signed)
-----   Message from Antoine PocheJonathan F Branch, MD sent at 10/29/2016  8:10 AM EDT ----- Let patient know I reviewed his chart in detail, Id like to stop his lopressor and restart his Toprol XL 25mg  daily which is a better medicine for his weakened heart muscle  Dominga FerryJ Branch MD

## 2016-10-31 DIAGNOSIS — R1310 Dysphagia, unspecified: Secondary | ICD-10-CM | POA: Diagnosis not present

## 2016-10-31 DIAGNOSIS — I2510 Atherosclerotic heart disease of native coronary artery without angina pectoris: Secondary | ICD-10-CM | POA: Diagnosis not present

## 2016-10-31 DIAGNOSIS — I110 Hypertensive heart disease with heart failure: Secondary | ICD-10-CM | POA: Diagnosis not present

## 2016-10-31 DIAGNOSIS — Z7951 Long term (current) use of inhaled steroids: Secondary | ICD-10-CM | POA: Diagnosis not present

## 2016-10-31 DIAGNOSIS — Z48812 Encounter for surgical aftercare following surgery on the circulatory system: Secondary | ICD-10-CM | POA: Diagnosis not present

## 2016-10-31 DIAGNOSIS — R69 Illness, unspecified: Secondary | ICD-10-CM | POA: Diagnosis not present

## 2016-10-31 DIAGNOSIS — I5022 Chronic systolic (congestive) heart failure: Secondary | ICD-10-CM | POA: Diagnosis not present

## 2016-10-31 DIAGNOSIS — Z951 Presence of aortocoronary bypass graft: Secondary | ICD-10-CM | POA: Diagnosis not present

## 2016-10-31 DIAGNOSIS — J441 Chronic obstructive pulmonary disease with (acute) exacerbation: Secondary | ICD-10-CM | POA: Diagnosis not present

## 2016-10-31 DIAGNOSIS — Z7982 Long term (current) use of aspirin: Secondary | ICD-10-CM | POA: Diagnosis not present

## 2016-10-31 NOTE — Anesthesia Procedure Notes (Signed)
Central Venous Catheter Insertion Performed by: Eilene GhaziOSE, Mckensi Redinger, anesthesiologist Start/End7/13/2018 7:00 AM, 10/05/2016 7:15 AM Patient location: Pre-op. Preanesthetic checklist: patient identified, IV checked, site marked, risks and benefits discussed, surgical consent, monitors and equipment checked, pre-op evaluation, timeout performed and anesthesia consent Position: Trendelenburg Lidocaine 1% used for infiltration Hand hygiene performed  and maximum sterile barriers used  Catheter size: 8.5 Fr PA cath was placed.Swan type:thermodilution PA Cath depth:45 Procedure performed using ultrasound guided technique. Ultrasound Notes:anatomy identified, needle tip was noted to be adjacent to the nerve/plexus identified and no ultrasound evidence of intravascular and/or intraneural injection Attempts: 1 Following insertion, line sutured, dressing applied and Biopatch. Post procedure assessment: blood return through all ports  Patient tolerated the procedure well with no immediate complications. Additional procedure comments: No picture available.

## 2016-10-31 NOTE — Addendum Note (Signed)
Addendum  created 10/31/16 1344 by Eilene Ghaziose, Lynne Takemoto, MD   Anesthesia Intra Blocks edited, Child order released for a procedure order, LDA created via procedure documentation, Sign clinical note

## 2016-11-02 ENCOUNTER — Ambulatory Visit (INDEPENDENT_AMBULATORY_CARE_PROVIDER_SITE_OTHER): Payer: Medicare HMO | Admitting: Urology

## 2016-11-02 DIAGNOSIS — N401 Enlarged prostate with lower urinary tract symptoms: Secondary | ICD-10-CM | POA: Diagnosis not present

## 2016-11-02 DIAGNOSIS — R338 Other retention of urine: Secondary | ICD-10-CM

## 2016-11-05 DIAGNOSIS — Z7951 Long term (current) use of inhaled steroids: Secondary | ICD-10-CM | POA: Diagnosis not present

## 2016-11-05 DIAGNOSIS — I5022 Chronic systolic (congestive) heart failure: Secondary | ICD-10-CM | POA: Diagnosis not present

## 2016-11-05 DIAGNOSIS — I110 Hypertensive heart disease with heart failure: Secondary | ICD-10-CM | POA: Diagnosis not present

## 2016-11-05 DIAGNOSIS — Z951 Presence of aortocoronary bypass graft: Secondary | ICD-10-CM | POA: Diagnosis not present

## 2016-11-05 DIAGNOSIS — R1310 Dysphagia, unspecified: Secondary | ICD-10-CM | POA: Diagnosis not present

## 2016-11-05 DIAGNOSIS — I2510 Atherosclerotic heart disease of native coronary artery without angina pectoris: Secondary | ICD-10-CM | POA: Diagnosis not present

## 2016-11-05 DIAGNOSIS — Z48812 Encounter for surgical aftercare following surgery on the circulatory system: Secondary | ICD-10-CM | POA: Diagnosis not present

## 2016-11-05 DIAGNOSIS — J441 Chronic obstructive pulmonary disease with (acute) exacerbation: Secondary | ICD-10-CM | POA: Diagnosis not present

## 2016-11-05 DIAGNOSIS — R69 Illness, unspecified: Secondary | ICD-10-CM | POA: Diagnosis not present

## 2016-11-05 DIAGNOSIS — Z7982 Long term (current) use of aspirin: Secondary | ICD-10-CM | POA: Diagnosis not present

## 2016-11-07 ENCOUNTER — Other Ambulatory Visit: Payer: Self-pay | Admitting: Physician Assistant

## 2016-11-07 ENCOUNTER — Other Ambulatory Visit (HOSPITAL_COMMUNITY): Payer: Self-pay | Admitting: Specialist

## 2016-11-07 DIAGNOSIS — I2510 Atherosclerotic heart disease of native coronary artery without angina pectoris: Secondary | ICD-10-CM | POA: Diagnosis not present

## 2016-11-07 DIAGNOSIS — R1310 Dysphagia, unspecified: Secondary | ICD-10-CM | POA: Diagnosis not present

## 2016-11-07 DIAGNOSIS — I5022 Chronic systolic (congestive) heart failure: Secondary | ICD-10-CM | POA: Diagnosis not present

## 2016-11-07 DIAGNOSIS — R1319 Other dysphagia: Secondary | ICD-10-CM

## 2016-11-07 DIAGNOSIS — R69 Illness, unspecified: Secondary | ICD-10-CM | POA: Diagnosis not present

## 2016-11-07 DIAGNOSIS — Z7951 Long term (current) use of inhaled steroids: Secondary | ICD-10-CM | POA: Diagnosis not present

## 2016-11-07 DIAGNOSIS — I110 Hypertensive heart disease with heart failure: Secondary | ICD-10-CM | POA: Diagnosis not present

## 2016-11-07 DIAGNOSIS — Z48812 Encounter for surgical aftercare following surgery on the circulatory system: Secondary | ICD-10-CM | POA: Diagnosis not present

## 2016-11-07 DIAGNOSIS — J441 Chronic obstructive pulmonary disease with (acute) exacerbation: Secondary | ICD-10-CM | POA: Diagnosis not present

## 2016-11-07 DIAGNOSIS — Z7982 Long term (current) use of aspirin: Secondary | ICD-10-CM | POA: Diagnosis not present

## 2016-11-07 DIAGNOSIS — Z951 Presence of aortocoronary bypass graft: Secondary | ICD-10-CM | POA: Diagnosis not present

## 2016-11-08 ENCOUNTER — Ambulatory Visit (INDEPENDENT_AMBULATORY_CARE_PROVIDER_SITE_OTHER): Payer: Medicare HMO | Admitting: Gastroenterology

## 2016-11-08 ENCOUNTER — Encounter: Payer: Self-pay | Admitting: Gastroenterology

## 2016-11-08 DIAGNOSIS — R634 Abnormal weight loss: Secondary | ICD-10-CM

## 2016-11-08 DIAGNOSIS — K5900 Constipation, unspecified: Secondary | ICD-10-CM

## 2016-11-08 DIAGNOSIS — R1084 Generalized abdominal pain: Secondary | ICD-10-CM

## 2016-11-08 NOTE — Patient Instructions (Signed)
1. Start Linzess daily for constipation. You may empty the capsule into yogurt. Samples provided. rx sent to pharmacy. 2. Try to increase calorie intake. Eat small snack/meal every 2 hours while awake.  3. Call if you bowel function does not improve or you continue to have abdominal pain and weight loss. Otherwise we will see you back in 6 weeks.

## 2016-11-08 NOTE — Progress Notes (Signed)
Primary Care Physician:  Assunta Found, MD  Primary Gastroenterologist:  Roetta Sessions, MD   Chief Complaint  Patient presents with  . Constipation  . Weight Loss    lost 15 lbs since June  . Abdominal Pain  . Dysphagia    thickened liquids    HPI:  Devin Barton is a 71 y.o. male referred by his PCP back in June for colonoscopy and upper endoscopy for heartburn. In the interim patient was found have significant coronary artery disease and underwent CABG 3 on 10/05/2016. He was discharged on July 21. He had to be readmitted for acute respiratory failure/fluid overload 2 days later and stayed additional 5 days. Came back to the ER on July 29 with inability to urinate required Foley catheter placement which remains in place at this time.   Patient presents with his wife today. He is slowly recovering at home. He was diagnosed with significant dysphagia with severe retention of PO's in the pharynx and intermittent aspiration. There was concern of UES narrowing. Esophagram showed normal motility and no stricture. He is on thickening agents with liquids. He is getting speech therapy twice weekly at home. Reportedly has another test coming up next week to see if he still requires thickening agents per wife.   Patient states he was placed on Prilosec back in June before his heart issues were discovered. Typical heartburn resolved. He denies any solid food esophageal dysphagia. He did develop some epigastric pain after his surgery which he related to constipation. Continues to have difficulty having BM.   Patient lost 23 pounds since June. His weight is been stable last 3 weeks however. According to his wife he has had a slow gradual decline in his weight over the past 10 years with maximal weight being around 180 pounds. He is down to 117 pounds now. She believes he is not eating enough. He states he has no appetite. No early satiety, nausea, vomiting. No melena or rectal bleeding. Denies abdominal  pain at this time. Unclear if he had a previous colonoscopy but none within the last 5-10 years.    He reports multiple hernia repairs as well as a bowel resection for perforated bowel due to trauma at some point in the remote past  Current Outpatient Prescriptions  Medication Sig Dispense Refill  . amiodarone (PACERONE) 200 MG tablet Take 1 tablet (200 mg total) by mouth daily. 30 tablet 0  . arformoterol (BROVANA) 15 MCG/2ML NEBU Take 2 mLs (15 mcg total) by nebulization 2 (two) times daily. 120 mL 0  . aspirin EC 325 MG EC tablet Take 1 tablet (325 mg total) by mouth daily. 30 tablet 0  . atorvastatin (LIPITOR) 40 MG tablet Take 1 tablet (40 mg total) by mouth daily at 6 PM. 30 tablet 0  . budesonide (PULMICORT) 0.25 MG/2ML nebulizer solution Take 2 mLs (0.25 mg total) by nebulization 2 (two) times daily. 60 mL 12  . feeding supplement, ENSURE ENLIVE, (ENSURE ENLIVE) LIQD Take 237 mLs by mouth 2 (two) times daily between meals. 237 mL 12  . finasteride (PROSCAR) 5 MG tablet Take 5 mg by mouth daily.    . furosemide (LASIX) 40 MG tablet TAKE 40 MG DAILY. mAY TAKE AN ADDITIONAL 40 MG FOR WEIGHT GAIN OR SWELLING. 60 tablet 3  . guaiFENesin (MUCINEX) 600 MG 12 hr tablet Take 600 mg by mouth daily.    Marland Kitchen ipratropium (ATROVENT) 0.02 % nebulizer solution Take 0.5 mg by nebulization daily as needed for wheezing  or shortness of breath.     . Maltodextrin-Xanthan Gum (RESOURCE THICKENUP CLEAR) POWD As needed for a month supple 5 Can 0  . metoprolol succinate (TOPROL XL) 25 MG 24 hr tablet Take 1 tablet (25 mg total) by mouth daily. 90 tablet 3  . nicotine (NICODERM CQ - DOSED IN MG/24 HR) 7 mg/24hr patch Place 7 mg onto the skin daily.    Marland Kitchen. omeprazole (PRILOSEC) 40 MG capsule Take 40 mg by mouth daily.    . potassium chloride SA (K-DUR,KLOR-CON) 20 MEQ tablet Take 1 tablet (20 mEq total) by mouth daily. 30 tablet 3  . traMADol (ULTRAM) 50 MG tablet TAKE 1 TABLET BY MOUTH EVERY 6 HOURS AS NEEDED FOR  MODERATE PAIN 28 tablet 0   No current facility-administered medications for this visit.     Allergies as of 11/08/2016 - Review Complete 11/08/2016  Allergen Reaction Noted  . No known allergies  10/04/2016    Past Medical History:  Diagnosis Date  . Asthma   . Cigarette smoker   . COPD (chronic obstructive pulmonary disease) (HCC)   . GERD (gastroesophageal reflux disease)     Past Surgical History:  Procedure Laterality Date  . ABDOMINAL HERNIA REPAIR    . BOWEL RESECTION     Bowel perf secondary to trauma  . CARDIAC CATHETERIZATION    . CORONARY ARTERY BYPASS GRAFT N/A 10/05/2016   Procedure: CORONARY ARTERY BYPASS GRAFTING (CABG) x4 with FREE MAMMARY.  ENDOSCOPIC HARVESTING OF RIGHT SAPHENOUS VEIN. (FREE LIMA to LAD, SVG to OM, SVG SEQUENTIALLY to ACUTE MARGINAL and PDA);  Surgeon: Loreli SlotHendrickson, Steven C, MD;  [name];  Service: Open Heart Surgery;  Laterality: N/A;  . EXPLORATORY LAPAROTOMY  1986   [name]/notes 08/08/2010  . FOOT FRACTURE SURGERY Left ~ 1965  . FRACTURE SURGERY    . HERNIA REPAIR    . INCISIONAL HERNIA REPAIR  05/2005   [name]/notes 08/08/2010  . INGUINAL HERNIA REPAIR Bilateral   . INGUINAL HERNIA REPAIR Right 09/2006   recurrent/notes 07/27/2010  . ORIF FOOT FRACTURE Left 1990   [name]/notes 08/08/2010  . RIGHT/LEFT HEART CATH AND CORONARY ANGIOGRAPHY N/A 10/04/2016   Procedure: Right/Left Heart Cath and Coronary Angiography;  Surgeon: Corky CraftsVaranasi, Jayadeep S, MD;  [facility];  Service: Cardiovascular;  Laterality: N/A;  . TEE WITHOUT CARDIOVERSION N/A 10/05/2016   Procedure: TRANSESOPHAGEAL ECHOCARDIOGRAM (TEE);  Surgeon: Loreli SlotHendrickson, Steven C, MD;  [facility];  Service: Open Heart Surgery;  Laterality: N/A;  . VENTRAL HERNIA REPAIR  09/2006   recurrent/notes 07/27/2010    Family History  Problem Relation Age of Onset  . Heart disease Mother        No details  . Alzheimer's disease Mother   . Atopy Neg Hx   . Colon cancer Neg Hx      Social History   Social History  . Marital status: Married    Spouse name: N/A  . Number of children: 2  . Years of education: N/A   Occupational History  . Not on file.   Social History Main Topics  . Smoking status: Former Smoker    Packs/day: 1.00    Years: 55.00    Types: Cigarettes  . Smokeless tobacco: Never Used  . Alcohol use No  . Drug use: No  . Sexual activity: Not Currently   Other Topics Concern  . Not on file   Social History Narrative   Lives at home with wife.  ROS:  General: Negative for   fever, chills, fatigue. See hpi Eyes: Negative for vision changes.  ENT: Negative for hoarseness,  nasal congestion.see hpi CV: Negative for chest pain, angina, palpitations, dyspnea on exertion, peripheral edema.  Respiratory: Negative for dyspnea at rest,   cough, sputum, wheezing. +DOE GI: See history of present illness. GU:  Negative for dysuria, hematuria, urinary incontinence, urinary frequency, nocturnal urination.  MS: Negative for joint pain, low back pain.  Derm: Negative for rash or itching.  Neuro: Negative for weakness, abnormal sensation, seizure, frequent headaches, memory loss, confusion.  Psych: Negative for anxiety, depression, suicidal ideation, hallucinations.  Endo: see hpi  Heme: Negative for bruising or bleeding. Allergy: Negative for rash or hives.    Physical Examination:  BP 111/70   Pulse 85   Temp 98.2 F (36.8 C) (Oral)   Ht 5\' 8"  (1.727 m)   Wt 117 lb 9.6 oz (53.3 kg)   BMI 17.88 kg/m    General: chronically ill-appearing WM, thin, oxygen via [place] in no acute distress.  Head: Normocephalic, atraumatic.   Eyes: Conjunctiva pink, no icterus. Mouth: Oropharyngeal mucosa moist and pink , no lesions erythema or exudate. Neck: Supple without thyromegaly, masses, or lymphadenopathy.  Lungs: Clear to auscultation bilaterally with diminished breath sounds in bases.  Heart: Regular rate and rhythm, no murmurs rubs or  gallops.  Abdomen: Bowel sounds are normal, nontender, nondistended, no hepatosplenomegaly or masses, no abdominal bruits or    hernia , no rebound or guarding.   Rectal: not peformed Extremities: No lower extremity edema. No clubbing or deformities.  Neuro: Alert and oriented x 4 , grossly normal neurologically.  Skin: Warm and dry, no rash or jaundice.   Psych: Alert and cooperative, normal mood and affect.  Labs: Lab Results  Component Value Date   CREATININE 1.10 10/20/2016   BUN 22 (H) 10/20/2016   NA 136 10/20/2016   K 3.5 10/20/2016   CL 97 (L) 10/20/2016   CO2 32 10/20/2016   Lab Results  Component Value Date   ALT 15 (L) 10/08/2016   AST 22 10/08/2016   ALKPHOS 42 10/08/2016   BILITOT 0.2 (L) 10/08/2016   Lab Results  Component Value Date   TSH 3.666 10/18/2016   Lab Results  Component Value Date   WBC 14.4 (H) 10/18/2016   HGB 10.7 (L) 10/18/2016   HCT 33.6 (L) 10/18/2016   MCV 90.8 10/18/2016   PLT 778 (H) 10/18/2016   No results found for: IRON, TIBC, FERRITIN  Normal Hgb on 10/04/16 at 13.9. Down to 8 range post-op  Imaging Studies: Dg Chest 2 View  Result Date: 10/15/2016 CLINICAL DATA:  Cough, shortness of breath EXAM: CHEST  2 VIEW COMPARISON:  10/09/2016 FINDINGS: Small left and trace right pleural effusions. Mild patchy left lower lobe opacity, likely atelectasis. No Remo interstitial edema.  No pneumothorax. The heart is normal in size. Postsurgical changes related to prior CABG. Median sternotomy.  Midline skin staples. Thoracic spine is within normal limits. IMPRESSION: Small left and trace right pleural effusions. Electronically Signed   By: Charline Bills M.D.   On: 10/15/2016 13:48   Ct Angio Chest Pe W Or Wo Contrast  Result Date: 10/15/2016 CLINICAL DATA:  Post op 1.5 weeks for open heart surgery. Pt is only complaining of a bit of chest pain where the staples are. No other chest complaints. EXAM: CT ANGIOGRAPHY CHEST WITH CONTRAST  TECHNIQUE: Multidetector CT imaging of the chest was performed using the  standard protocol during bolus administration of intravenous contrast. Multiplanar CT image reconstructions and MIPs were obtained to evaluate the vascular anatomy. CONTRAST:  100 ml of isovue 370 given. COMPARISON:  06/07/2012 FINDINGS: Cardiovascular: Mild four-chamber cardiac enlargement. Satisfactory opacification of pulmonary arteries noted, and there is no evidence of pulmonary emboli. Previous CABG. Calcified plaque scattered through the thoracic aorta. No dissection, aneurysm, or stenosis. Classic 3 vessel brachiocephalic arterial origin anatomy without proximal stenosis. Visualized proximal abdominal aorta is atheromatous without aneurysm. Mediastinum/Nodes: Small pericardial effusion. No hilar or mediastinal adenopathy. Lungs/Pleura: Small right and moderate left pleural effusions without suggestion of loculation or pleural enhancement. Dependent atelectasis posteriorly in the lower lobes. Lungs otherwise clear. No pneumothorax. Upper Abdomen: No acute findings. Musculoskeletal: Previous median sternotomy. Anterior skin staples. No fracture or worrisome bone lesion. Review of the MIP images confirms the above findings. IMPRESSION: 1. Pleural effusions, left greater than right. 2. Negative for acute PE or thoracic aortic dissection. 3. Status post median sternotomy and CABG. Electronically Signed   By: Corlis Leak M.D.   On: 10/15/2016 21:28   Dg Esophagus  Result Date: 10/19/2016 CLINICAL DATA:  Dysphasia. EXAM: ESOPHOGRAM/BARIUM SWALLOW TECHNIQUE: Single contrast examination was performed using nectar thick barium. FLUOROSCOPY TIME:  Fluoroscopy Time:  48 seconds Radiation Exposure Index (if provided by the fluoroscopic device): na Number of Acquired Spot Images: 0 COMPARISON:  10/15/2016 FINDINGS: Examination was performed with the patient in the upright orientation only. The esophagus is patent. There is no stricture or mass  identified. The motility of the esophagus appears within normal limits. No hiatal hernia or reflux identified. IMPRESSION: 1. Patent esophagus.  No stricture mass visualized. Electronically Signed   By: Signa Kell M.D.   On: 10/19/2016 10:39   Dg Swallowing Func-speech Pathology  Result Date: 10/18/2016 Objective Swallowing Evaluation: Type of Study: MBS-Modified Barium Swallow Study Patient Details Name: KIRKLAND FIGG MRN: [id number] Date of Birth: 1946-02-10 Today's Date: 10/18/2016 Time: SLP Start Time (ACUTE ONLY): 0935-SLP Stop Time (ACUTE ONLY): 1000 SLP Time Calculation (min) (ACUTE ONLY): 25 min Past Medical History: Past Medical History: Diagnosis Date . Asthma  . Cigarette smoker  . COPD (chronic obstructive pulmonary disease) (HCC)  . GERD (gastroesophageal reflux disease)  Past Surgical History: Past Surgical History: Procedure Laterality Date . ABDOMINAL HERNIA REPAIR   . BOWEL RESECTION    Bowel perf secondary to trauma . CARDIAC CATHETERIZATION   . CORONARY ARTERY BYPASS GRAFT N/A 10/05/2016  Procedure: CORONARY ARTERY BYPASS GRAFTING (CABG) x4 with FREE MAMMARY.  ENDOSCOPIC HARVESTING OF RIGHT SAPHENOUS VEIN. (FREE LIMA to LAD, SVG to OM, SVG SEQUENTIALLY to ACUTE MARGINAL and PDA);  Surgeon: Loreli Slot, MD;  Location[facility] OR;  Service: Open Heart Surgery;  Laterality: N/A; . EXPLORATORY LAPAROTOMY  1986  [name] 08/08/2010 . FOOT FRACTURE SURGERY Left ~ 1965 . FRACTURE SURGERY   . HERNIA REPAIR   . INCISIONAL HERNIA REPAIR  05/2005  [name] 08/08/2010 . INGUINAL HERNIA REPAIR Bilateral  . INGUINAL HERNIA REPAIR Right 09/2006  recurrent/notes 07/27/2010 . ORIF FOOT FRACTURE Left 1990  [name] 08/08/2010 . RIGHT/LEFT HEART CATH AND CORONARY ANGIOGRAPHY N/A 10/04/2016  Procedure: Right/Left Heart Cath and Coronary Angiography;  Surgeon: Corky Crafts, MD;  Location[facility] INVAS[facility] CV LAB;  Service: Cardiovascular;  Laterality: N/A; . TEE WITHOUT CARDIOVERSION N/A 10/05/2016  Procedure:  TRANSESOPHAGEAL ECHOCARDIOGRAM (TEE);  Surgeon: Loreli Slot, MD;  Location: [facility] OR;  Service: Open Heart Surgery;  Laterality: N/A; . VENTRAL HERNIA REPAIR  09/2006  recurrent/notes 07/27/2010 HPI: Pt is a 71 y.o. 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 Patient re-admitted 7/23, states since he has been home, he has been having more dyspnea, cough, congestion with brownish phlegm. Patient has orthopnea, sitting up upright and worsening LE edema. CXR showed small left and trace right pleural effusions. Bedside swallow eval ordered due to "cough when eating".  No Data Recorded Assessment / Plan / Recommendation CHL IP CLINICAL IMPRESSIONS 10/18/2016 Clinical Impression Pt with a moderate-severe pharyngoesophageal dysphagia. Pharyngeal phase characterized by premature spillage to the level of the pyriform sinuses, reduced hyolaryngeal excursion and base of tongue retraction along with reduced epiglottic inversion/ airway closure resulting in Koi aspiration of thin liquids before the swallow, along with significant residuals at the UES, pyriform sinuses, and vallecula. These residuals were noted across consistencies. Pt also with significantly reduced opening of the UES and the appearance of significant narrowing of the upper esophagus. Of the compensatory strategies attempted, a head turn to the right side was beneficial in opening of the UES for clearance of material to the esophagus. Pt did have one instance of trace aspiration with nectar-thick liquids with post-swallow residuals. Pt is at an increased risk of aspiration given these factors and respiratory status. Recommend downgrading diet to dysphagia 2/ nectar-thick liquids, meds crushed in puree. Also use the following strategies: head turn to the R  side, effortful swallow, multiple swallows. SLP will continue to follow for compensatory strategy review along with exercises for hyolaryngeal excursion/ base of tongue movement; also consider a repeat MBS before d/c. Pt may also benefit from further esophageal assessment. SLP Visit Diagnosis Dysphagia, pharyngoesophageal phase (R13.14) Attention and concentration deficit following -- Frontal lobe and executive function deficit following -- Impact on safety and function Moderate aspiration risk   CHL IP TREATMENT RECOMMENDATION 10/18/2016 Treatment Recommendations Therapy as outlined in treatment plan below   Prognosis 10/18/2016 Prognosis for Safe Diet Advancement Fair Barriers to Reach Goals Severity of deficits Barriers/Prognosis Comment -- CHL IP DIET RECOMMENDATION 10/18/2016 SLP Diet Recommendations Dysphagia 2 (Fine chop) solids;Nectar thick liquid Liquid Administration via Cup;Straw Medication Administration Crushed with puree Compensations Slow rate;Small sips/bites;Multiple dry swallows after each bite/sip;Clear throat intermittently;Effortful swallow;Other (Comment) Postural Changes Remain semi-upright after after feeds/meals (Comment);Seated upright at 90 degrees   CHL IP OTHER RECOMMENDATIONS 10/18/2016 Recommended Consults Consider esophageal assessment Oral Care Recommendations Oral care BID Other Recommendations Order thickener from pharmacy;Clarify dietary restrictions   CHL IP FOLLOW UP RECOMMENDATIONS 10/18/2016 Follow up Recommendations Home health SLP   CHL IP FREQUENCY AND DURATION 10/18/2016 Speech Therapy Frequency (ACUTE ONLY) min 3x week Treatment Duration 2 weeks      CHL IP ORAL PHASE 10/18/2016 Oral Phase WFL Oral - Pudding Teaspoon -- Oral - Pudding Cup -- Oral - Honey Teaspoon -- Oral - Honey Cup -- Oral - Nectar Teaspoon -- Oral - Nectar Cup -- Oral - Nectar Straw -- Oral - Thin Teaspoon -- Oral - Thin Cup -- Oral - Thin Straw -- Oral - Puree -- Oral - Mech Soft -- Oral - Regular -- Oral -  Multi-Consistency -- Oral - Pill -- Oral Phase - Comment --  CHL IP PHARYNGEAL PHASE 10/18/2016 Pharyngeal Phase Impaired Pharyngeal- Pudding Teaspoon -- Pharyngeal -- Pharyngeal- Pudding Cup -- Pharyngeal -- Pharyngeal- Honey Teaspoon -- Pharyngeal --  Pharyngeal- Honey Cup -- Pharyngeal -- Pharyngeal- Nectar Teaspoon -- Pharyngeal -- Pharyngeal- Nectar Cup Delayed swallow initiation-pyriform sinuses;Reduced epiglottic inversion;Reduced anterior laryngeal mobility;Reduced laryngeal elevation;Reduced airway/laryngeal closure;Reduced tongue base retraction;Pharyngeal residue - valleculae;Pharyngeal residue - pyriform;Pharyngeal residue - cp segment Pharyngeal -- Pharyngeal- Nectar Straw Delayed swallow initiation-pyriform sinuses;Reduced epiglottic inversion;Reduced anterior laryngeal mobility;Reduced laryngeal elevation;Reduced airway/laryngeal closure;Reduced tongue base retraction;Penetration/Apiration after swallow;Trace aspiration;Pharyngeal residue - valleculae;Pharyngeal residue - pyriform;Pharyngeal residue - cp segment Pharyngeal Material enters airway, passes BELOW cords without attempt by patient to eject out (silent aspiration) Pharyngeal- Thin Teaspoon -- Pharyngeal -- Pharyngeal- Thin Cup Delayed swallow initiation-pyriform sinuses;Reduced epiglottic inversion;Reduced anterior laryngeal mobility;Reduced laryngeal elevation;Reduced airway/laryngeal closure;Reduced tongue base retraction;Penetration/Aspiration before swallow;Moderate aspiration;Pharyngeal residue - valleculae;Pharyngeal residue - pyriform;Pharyngeal residue - cp segment;Compensatory strategies attempted (with notebox) Pharyngeal Material enters airway, passes BELOW cords and not ejected out despite cough attempt by patient Pharyngeal- Thin Straw -- Pharyngeal -- Pharyngeal- Puree Delayed swallow initiation-vallecula;Reduced epiglottic inversion;Reduced anterior laryngeal mobility;Reduced laryngeal elevation;Reduced tongue base  retraction;Pharyngeal residue - valleculae;Pharyngeal residue - pyriform;Pharyngeal residue - cp segment;Compensatory strategies attempted (with notebox) Pharyngeal -- Pharyngeal- Mechanical Soft -- Pharyngeal -- Pharyngeal- Regular Delayed swallow initiation-vallecula;Reduced epiglottic inversion;Reduced anterior laryngeal mobility;Reduced laryngeal elevation;Reduced tongue base retraction;Pharyngeal residue - valleculae;Pharyngeal residue - pyriform;Pharyngeal residue - cp segment;Compensatory strategies attempted (with notebox) Pharyngeal -- Pharyngeal- Multi-consistency -- Pharyngeal -- Pharyngeal- Pill -- Pharyngeal -- Pharyngeal Comment head turn to R- somewhat effective in clearing residuals  CHL IP CERVICAL ESOPHAGEAL PHASE 10/18/2016 Cervical Esophageal Phase Impaired Pudding Teaspoon -- Pudding Cup -- Honey Teaspoon -- Honey Cup -- Nectar Teaspoon -- Nectar Cup Reduced cricopharyngeal relaxation Nectar Straw Reduced cricopharyngeal relaxation Thin Teaspoon -- Thin Cup Reduced cricopharyngeal relaxation Thin Straw -- Puree Reduced cricopharyngeal relaxation Mechanical Soft -- Regular Reduced cricopharyngeal relaxation Multi-consistency -- Pill -- Cervical Esophageal Comment -- No flowsheet data found. Metro Kung, MA, CCC-SLP 10/18/2016, 10:27 AM [phone]

## 2016-11-09 ENCOUNTER — Encounter: Payer: Self-pay | Admitting: Gastroenterology

## 2016-11-09 NOTE — Assessment & Plan Note (Addendum)
71 year old gentleman with history of CAD status post recent CABG 3 on July 13, COPD on oxygen who presents for further evaluation of heartburn, consideration of colonoscopy and EGD. Since his initial referral he is had CABG and multiple hospitalizations. Diagnosed with oropharyngeal dysphagia with risk for aspiration undergoing speech therapy rehabilitation with his upcoming repeat modified barium swallow.  Patient has had progressive weight loss over the past 10 years per wife. He used to weigh 180 pounds. Documented weight of the 157 in 2012, 140 in 2014, 130 pounds prior to recent CABG. He's down to 117 pounds now. He has severe COPD which may be contributed into his weight loss. From a GI standpoint he has poor appetite, heartburn well-controlled on PPI, constipation. Due for colonoscopy plus or minus EGD. Discussed at length with patient and his wife. At this time would like to postpone procedures given recent surgery. We will manage his constipation and have him come back in 6 weeks to see how he is doing. Encouraged him to eat every 2-3 hours, increase caloric intake. They will call with any changes in the interim.  Start Linzess daily.   With regards to dysphagia, he has oropharyngeal dysphagia. There was concern about possible UES narrowing, esophagram was normal. No tablet given. Patient cannot swallowing tablet. Has to empty capsules into yogurt or crush pills. May benefit from EGD at time of colonoscopy.

## 2016-11-12 ENCOUNTER — Other Ambulatory Visit: Payer: Self-pay | Admitting: Thoracic Surgery (Cardiothoracic Vascular Surgery)

## 2016-11-12 DIAGNOSIS — I110 Hypertensive heart disease with heart failure: Secondary | ICD-10-CM | POA: Diagnosis not present

## 2016-11-12 DIAGNOSIS — J449 Chronic obstructive pulmonary disease, unspecified: Secondary | ICD-10-CM | POA: Diagnosis not present

## 2016-11-12 DIAGNOSIS — I5022 Chronic systolic (congestive) heart failure: Secondary | ICD-10-CM | POA: Diagnosis not present

## 2016-11-12 DIAGNOSIS — R69 Illness, unspecified: Secondary | ICD-10-CM | POA: Diagnosis not present

## 2016-11-12 DIAGNOSIS — I2510 Atherosclerotic heart disease of native coronary artery without angina pectoris: Secondary | ICD-10-CM | POA: Diagnosis not present

## 2016-11-12 DIAGNOSIS — Z7982 Long term (current) use of aspirin: Secondary | ICD-10-CM | POA: Diagnosis not present

## 2016-11-12 DIAGNOSIS — I5021 Acute systolic (congestive) heart failure: Secondary | ICD-10-CM | POA: Diagnosis not present

## 2016-11-12 DIAGNOSIS — Z951 Presence of aortocoronary bypass graft: Secondary | ICD-10-CM | POA: Diagnosis not present

## 2016-11-12 DIAGNOSIS — J441 Chronic obstructive pulmonary disease with (acute) exacerbation: Secondary | ICD-10-CM | POA: Diagnosis not present

## 2016-11-12 DIAGNOSIS — R1310 Dysphagia, unspecified: Secondary | ICD-10-CM | POA: Diagnosis not present

## 2016-11-12 DIAGNOSIS — Z7951 Long term (current) use of inhaled steroids: Secondary | ICD-10-CM | POA: Diagnosis not present

## 2016-11-12 DIAGNOSIS — Z48812 Encounter for surgical aftercare following surgery on the circulatory system: Secondary | ICD-10-CM | POA: Diagnosis not present

## 2016-11-12 NOTE — Progress Notes (Signed)
cc'ed to pcp °

## 2016-11-13 ENCOUNTER — Ambulatory Visit
Admission: RE | Admit: 2016-11-13 | Discharge: 2016-11-13 | Disposition: A | Discharge status: Home / Self Care | Payer: Medicare HMO | Source: Ambulatory Visit | Attending: Thoracic Surgery (Cardiothoracic Vascular Surgery) | Admitting: Thoracic Surgery (Cardiothoracic Vascular Surgery)

## 2016-11-13 ENCOUNTER — Encounter: Payer: Self-pay | Admitting: Thoracic Surgery (Cardiothoracic Vascular Surgery)

## 2016-11-13 ENCOUNTER — Ambulatory Visit (INDEPENDENT_AMBULATORY_CARE_PROVIDER_SITE_OTHER): Payer: Self-pay | Admitting: Thoracic Surgery (Cardiothoracic Vascular Surgery)

## 2016-11-13 VITALS — BP 132/77 | HR 74 | Resp 16 | Ht 68.0 in | Wt 117.0 lb

## 2016-11-13 DIAGNOSIS — J90 Pleural effusion, not elsewhere classified: Secondary | ICD-10-CM | POA: Diagnosis not present

## 2016-11-13 DIAGNOSIS — Z951 Presence of aortocoronary bypass graft: Secondary | ICD-10-CM

## 2016-11-13 NOTE — Progress Notes (Signed)
[address]       [place] [address]             [phone]    HPI: Mr. Lauderman returns for a scheduled postoperative follow-up visit.  He is a 71 year old gentleman with heavy ongoing tobacco abuse, severe COPD, reflux and difficulty swallowing. He was admitted with chest pain in early June. Troponin ENT were elevated. Dr. Antoine Poche did a Lexi scan Myoview which showed ejection fraction 30-40% with significant scarring. He underwent cardiac catheterization which revealed severe left main and three-vessel disease.  He underwent coronary bypass grafting 4 on 10/05/2016. He had postoperative atrial fibrillation, which converted to sinus rhythm with amiodarone. He was discharged on home oxygen. He was readmitted a few days later with shortness of breath. He had run out of Lasix and albuterol at home. He responded to medical therapy. He says he is breathing better and wants to know if he can come off of the home oxygen. He is not having any incisional pain. He denies any anginal pain.   Past Medical History:  Diagnosis Date  . Asthma   . CAD (coronary artery disease)    Status post CABG July 2018  . Cigarette smoker   . COPD (chronic obstructive pulmonary disease) (HCC)   . GERD (gastroesophageal reflux disease)     Current Outpatient Prescriptions  Medication Sig Dispense Refill  . amiodarone (PACERONE) 200 MG tablet Take 1 tablet (200 mg total) by mouth daily. 30 tablet 0  . arformoterol (BROVANA) 15 MCG/2ML NEBU Take 2 mLs (15 mcg total) by nebulization 2 (two) times daily. 120 mL 0  . aspirin EC 325 MG EC tablet Take 1 tablet (325 mg total) by mouth daily. 30 tablet 0  . atorvastatin (LIPITOR) 40 MG tablet Take 1 tablet (40 mg total) by mouth daily at 6 PM. 30 tablet 0  . budesonide (PULMICORT) 0.25 MG/2ML nebulizer solution Take 2 mLs (0.25 mg total) by nebulization 2 (two) times daily. 60 mL 12  . feeding supplement, ENSURE ENLIVE, (ENSURE ENLIVE) LIQD Take  237 mLs by mouth 2 (two) times daily between meals. 237 mL 12  . finasteride (PROSCAR) 5 MG tablet Take 5 mg by mouth daily.    . furosemide (LASIX) 40 MG tablet TAKE 40 MG DAILY. mAY TAKE AN ADDITIONAL 40 MG FOR WEIGHT GAIN OR SWELLING. 60 tablet 3  . guaiFENesin (MUCINEX) 600 MG 12 hr tablet Take 600 mg by mouth daily.    Marland Kitchen ipratropium (ATROVENT) 0.02 % nebulizer solution Take 0.5 mg by nebulization daily as needed for wheezing or shortness of breath.     . linaclotide (LINZESS) 72 MCG capsule 72 mcg. TAKE EVERY 2 DAYS    . Maltodextrin-Xanthan Gum (RESOURCE THICKENUP CLEAR) POWD As needed for a month supple 5 Can 0  . metoprolol succinate (TOPROL XL) 25 MG 24 hr tablet Take 1 tablet (25 mg total) by mouth daily. 90 tablet 3  . nicotine (NICODERM CQ - DOSED IN MG/24 HR) 7 mg/24hr patch Place 7 mg onto the skin daily.    Marland Kitchen omeprazole (PRILOSEC) 40 MG capsule Take 40 mg by mouth daily.    . potassium chloride SA (K-DUR,KLOR-CON) 20 MEQ tablet Take 1 tablet (20 mEq total) by mouth daily. 30 tablet 3  . traMADol (ULTRAM) 50 MG tablet TAKE 1 TABLET BY MOUTH EVERY 6 HOURS AS NEEDED FOR MODERATE PAIN 28 tablet 0   No current facility-administered medications for this visit.  Physical Exam BP 132/77 (BP Location: Right Arm, Patient Position: Sitting, Cuff Size: Normal)   Pulse 74   Resp 16   Ht 5\' 8"  (1.727 m)   Wt 117 lb (53.1 kg)   SpO2 98% Comment: ON 2L02  BMI 17.79 kg/m  Frail 71 year old man in no acute distress Alert and oriented 3 with no focal deficits Nasal cannula oxygen in place Lungs diminished breath sounds bilaterally, no wheezing Cardiac regular rate and rhythm Sternal incision healing well, sternum stable Leg incision healing well, trace edema  Diagnostic Tests: CHEST  2 VIEW  COMPARISON:  Chest x-ray of October 15, 2016  FINDINGS: The lungs are well-expanded and clear. The small bilateral pleural effusions have cleared. The heart and pulmonary vascularity  are normal. The sternal wires are intact. There is calcification in the wall of the thoracic aorta. The mediastinum is normal in width. The retrosternal soft tissues are normal. There is mild multilevel degenerative disc disease of the thoracic spine.  IMPRESSION: There is no acute cardiopulmonary abnormality. Interval clearing of bilateral pleural effusions.  Thoracic aortic atherosclerosis.   Electronically Signed   By: David  [place] M.D.   On: 11/13/2016 14:26 I personally reviewed the chest x-ray concur with the findings noted above  Impression: Mr. Graeff is a 71 year old gentleman with a history of tobacco abuse and severe COPD. He presented in early June with chest pain and shortness of breath. He ruled in for non-Q-wave MI in his BNP was elevated. It turned out to have left main and three-vessel disease and underwent coronary bypass grafting 4 little over a month ago.  Coronary artery disease- status post coronary bypass grafting 4. Continue aspirin, Lipitor, Toprol.  COPD- severe with FEV1 of 0.75 preoperatively. Improved to 0.98 with bronchodilators. Continue Brovana. pulmicort and use albuterol when necessary  I advised him to wait another month before attempting to lift anything over 10 pounds.  We will have his home home health team check him on room air is see if he can wean off the oxygen  Plan: I'll be happy to see Mr. Geier back at any time in the future if I can be of any further assistance with his care.  Loreli Slot, MD [facility] [phone]

## 2016-11-14 DIAGNOSIS — R69 Illness, unspecified: Secondary | ICD-10-CM | POA: Diagnosis not present

## 2016-11-15 ENCOUNTER — Ambulatory Visit (HOSPITAL_COMMUNITY)
Admission: RE | Admit: 2016-11-15 | Discharge: 2016-11-15 | Disposition: A | Discharge status: Home / Self Care | Payer: Medicare HMO | Source: Ambulatory Visit | Attending: Pulmonary Disease | Admitting: Pulmonary Disease

## 2016-11-15 ENCOUNTER — Ambulatory Visit (HOSPITAL_COMMUNITY): Payer: Medicare HMO | Attending: Pulmonary Disease | Admitting: Speech Pathology

## 2016-11-15 ENCOUNTER — Encounter (HOSPITAL_COMMUNITY): Payer: Self-pay | Admitting: Speech Pathology

## 2016-11-15 DIAGNOSIS — R1319 Other dysphagia: Secondary | ICD-10-CM

## 2016-11-15 DIAGNOSIS — R1314 Dysphagia, pharyngoesophageal phase: Secondary | ICD-10-CM | POA: Diagnosis not present

## 2016-11-15 NOTE — Therapy (Signed)
[facility] [address] [place], [place], [address] Phone: [phone]   Fax:  [phone]  Modified Barium Swallow  Patient Details  Name: Devin Barton MRN: [id number] Date of Birth: 21-Jul-1945 No Data Recorded  Encounter Date: 11/15/2016      End of Session - 11/15/16 1506    Visit Number 1   Number of Visits 1   Authorization Type [employer]   Activity Tolerance Patient tolerated treatment well      Past Medical History:  Diagnosis Date  . Asthma   . CAD (coronary artery disease)    Status post CABG July 2018  . Cigarette smoker   . COPD (chronic obstructive pulmonary disease) (HCC)   . GERD (gastroesophageal reflux disease)     Past Surgical History:  Procedure Laterality Date  . ABDOMINAL HERNIA REPAIR    . BOWEL RESECTION     Bowel perf secondary to trauma  . CARDIAC CATHETERIZATION    . CORONARY ARTERY BYPASS GRAFT N/A 10/05/2016   Procedure: CORONARY ARTERY BYPASS GRAFTING (CABG) x4 with FREE MAMMARY.  ENDOSCOPIC HARVESTING OF RIGHT SAPHENOUS VEIN. (FREE LIMA to LAD, SVG to OM, SVG SEQUENTIALLY to ACUTE MARGINAL and PDA);  Surgeon: Loreli Slot, MD;  Location: [facility] OR;  Service: Open Heart Surgery;  Laterality: N/A;  . EXPLORATORY LAPAROTOMY  1986   [name] 08/08/2010  . FOOT FRACTURE SURGERY Left ~ 1965  . FRACTURE SURGERY    . HERNIA REPAIR    . INCISIONAL HERNIA REPAIR  05/2005   [name] 08/08/2010  . INGUINAL HERNIA REPAIR Bilateral   . INGUINAL HERNIA REPAIR Right 09/2006   recurrent/notes 07/27/2010  . ORIF FOOT FRACTURE Left 1990   [name] 08/08/2010  . RIGHT/LEFT HEART CATH AND CORONARY ANGIOGRAPHY N/A 10/04/2016   Procedure: Right/Left Heart Cath and Coronary Angiography;  Surgeon: Corky Crafts, MD;  Location: [facility] INVASIVE CV LAB;  Service: Cardiovascular;  Laterality: N/A;  . TEE WITHOUT CARDIOVERSION N/A 10/05/2016   Procedure: TRANSESOPHAGEAL ECHOCARDIOGRAM (TEE);  Surgeon: Loreli Slot, MD;  Location: [facility] OR;  Service: Open Heart Surgery;  Laterality: N/A;  . VENTRAL HERNIA REPAIR  09/2006   recurrent/notes 07/27/2010    There were no vitals filed for this visit.      Subjective Assessment - 11/15/16 1503    Subjective "I've been drinking water at home."   Special Tests MBSS   Currently in Pain? No/denies             General - 11/15/16 1502      General Information   Date of Onset 10/18/16   HPI Pt is a 71 y.o. 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 Patient re-admitted 7/23, with worsening dyspnea, cough, congestion with brownish phlegm. Patient has orthopnea, sitting up upright and worsening LE edema. CXR showed small left and trace right pleural effusions. MBSS completed at [facility] on 10/18/2016 showing moderate-severe pharyngoesophageal dysphagia and was discharged on D2 and NTL. Pt with significant retention near UES/pyriforms across consistencies. He is referred by Dr. Kari Baars for a repeat MBSS today.   Type of Study MBS-Modified Barium Swallow Study   Previous Swallow Assessment 10/18/16 D2/NTL   Diet Prior to this Study Dysphagia 3 (soft);Nectar-thick liquids   Temperature Spikes Noted No  Respiratory Status Nasal cannula   History of Recent Intubation No   Behavior/Cognition Alert;Cooperative;Pleasant mood   Oral Cavity Assessment Within Functional Limits   Oral Care Completed by SLP No   Oral Cavity - Dentition Edentulous   Vision Functional for self feeding   Self-Feeding Abilities Able to feed self   Patient Positioning Upright in chair   Baseline Vocal Quality Breathy;Low vocal intensity   Volitional Cough Strong   Volitional Swallow Able to elicit   Anatomy Within functional limits   Pharyngeal Secretions Not observed  secondary MBS            Oral Preparation/Oral Phase - 2016/11/24 1504      Oral Preparation/Oral Phase   Oral Phase Within functional limits     Electrical stimulation - Oral Phase   Was Electrical Stimulation Used No          Pharyngeal Phase - 11-24-2016 1504      Pharyngeal Phase   Pharyngeal Phase Impaired     Electrical Stimulation - Pharyngeal Phase   Was Electrical Stimulation Used No          Cricopharyngeal Phase - 24-Nov-2016 1504      Cervical Esophageal Phase   Cervical Esophageal Phase Impaired     Cervical Esophageal Phase - Solids   Puree Reduced cricopharyngeal relaxation;Prominent cricopharyngeal segment           Plan - 24-Nov-2016 1506    Clinical Impression Statement Pt presents with moderate pharyngoesophageal phase dysphagia characterized by swallow trigger at the level of the pyriforms across consistencies, reduced epiglottic deflection, reduced posterior pharyngeal wall contraction, and reduced relaxation of the UES resulting in significant pooling in the pyriforms and trace penetration during/after the swallow with thins (to the underside of the cords which was cleared with SLP cue to clear throat). Pt had the most significant pooling/stasis with puree along the posterior pharyngeal wall and pyriforms. Head turn to the Right during the swallow was beneficial, however did not completely clear pooling. Pt appeared to have improved UES opening when presented with sequential straw sips of liquids, however he needed cues to cough/clear throat after the swallows and repeat with a dry swallow.  No penetration observed with NTL; trace penetration of thins after the swallow occurred. Pt may benefit from upper endoscopy and/or manometry due to suspected UES dysfunction. Imaging from previous MBSS reviewed by this examiner and Pt does demonstrate improvement (mostly with vocal fold closure), however significant stasis of bolus persists in pyriforms. Recommend  continued soft textures and NTL with meals with head turn to the Right during the swallow; trials thin with SLP and advance clinically.       Patient will benefit from skilled therapeutic intervention in order to improve the following deficits and impairments:   Dysphagia, pharyngoesophageal phase      G-Codes - 11-24-2016 1507    Functional Assessment Tool Used MBSS   Functional Limitations Swallowing   Swallow Current Status (F0263) At least 40 percent but less than 60 percent impaired, limited or restricted   Swallow Goal Status (Z8588) At least 40 percent but less than 60 percent impaired, limited or restricted   Swallow Discharge Status [phone]) At least 40 percent but less than 60 percent impaired, limited or restricted          Recommendations/Treatment - November 24, 2016 1505      Swallow Evaluation Recommendations   Recommended Consults Consider GI evaluation   SLP Diet Recommendations Dysphagia 3 (mechanical soft);Nectar;Thin   Liquid  Administration via Cup;Straw   Medication Administration Crushed with puree   Supervision Patient able to self feed   Compensations Multiple dry swallows after each bite/sip;Clear throat after each swallow;Hard cough after swallow   Postural Changes Seated upright at 90 degrees;Remain upright for at least 30 minutes after feeds/meals          Prognosis - 11/15/16 1505      Prognosis   Prognosis for Safe Diet Advancement Fair   Barriers to Reach Goals Severity of deficits     Individuals Consulted   Consulted and Agree with Results and Recommendations Patient   Report Sent to  Referring physician;Primary SLP      Problem List Patient Active Problem List   Diagnosis Date Noted  . Constipation 11/08/2016  . Generalized abdominal pain 11/08/2016  . Loss of weight 11/08/2016  . S/P CABG (coronary artery bypass graft)   . PAF (paroxysmal atrial fibrillation) (HCC)   . Acute on chronic combined systolic and diastolic CHF (congestive heart  failure) (HCC)   . Acute respiratory failure (HCC) 10/15/2016  . Coronary artery disease 10/05/2016  . CAD (coronary artery disease) 10/04/2016  . Acute systolic heart failure (HCC)   . Precordial chest pain 09/13/2016  . Abnormal EKG 09/13/2016  . Unintentional weight loss 06/06/2012  . COPD exacerbation (HCC) 06/06/2012  . Hyperglycemia, drug-induced 06/06/2012  . CIGARETTE SMOKER 08/15/2007  . HYPERTENSION, BENIGN 08/15/2007  . COPD 07/22/2007   Thank you,  Havery Moros, CCC-SLP [phone]  [facility] 11/15/2016, 3:07 PM  [facility] [address] [place], [place], [address] Phone: [phone]   Fax:  [phone]  Name: FODAY CONE MRN: [id number] Date of Birth: February 12, 1946

## 2016-11-16 ENCOUNTER — Telehealth: Payer: Self-pay

## 2016-11-16 DIAGNOSIS — I5022 Chronic systolic (congestive) heart failure: Secondary | ICD-10-CM | POA: Diagnosis not present

## 2016-11-16 DIAGNOSIS — I2510 Atherosclerotic heart disease of native coronary artery without angina pectoris: Secondary | ICD-10-CM | POA: Diagnosis not present

## 2016-11-16 DIAGNOSIS — Z7951 Long term (current) use of inhaled steroids: Secondary | ICD-10-CM | POA: Diagnosis not present

## 2016-11-16 DIAGNOSIS — R69 Illness, unspecified: Secondary | ICD-10-CM | POA: Diagnosis not present

## 2016-11-16 DIAGNOSIS — Z48812 Encounter for surgical aftercare following surgery on the circulatory system: Secondary | ICD-10-CM | POA: Diagnosis not present

## 2016-11-16 DIAGNOSIS — Z7982 Long term (current) use of aspirin: Secondary | ICD-10-CM | POA: Diagnosis not present

## 2016-11-16 DIAGNOSIS — Z951 Presence of aortocoronary bypass graft: Secondary | ICD-10-CM | POA: Diagnosis not present

## 2016-11-16 DIAGNOSIS — R1310 Dysphagia, unspecified: Secondary | ICD-10-CM | POA: Diagnosis not present

## 2016-11-16 DIAGNOSIS — J441 Chronic obstructive pulmonary disease with (acute) exacerbation: Secondary | ICD-10-CM | POA: Diagnosis not present

## 2016-11-16 DIAGNOSIS — I110 Hypertensive heart disease with heart failure: Secondary | ICD-10-CM | POA: Diagnosis not present

## 2016-11-16 NOTE — Telephone Encounter (Signed)
Order was Faxed to [facility] to wean patient off of home oxygen.

## 2016-11-19 ENCOUNTER — Telehealth: Payer: Self-pay | Admitting: Cardiology

## 2016-11-19 DIAGNOSIS — I5022 Chronic systolic (congestive) heart failure: Secondary | ICD-10-CM | POA: Diagnosis not present

## 2016-11-19 DIAGNOSIS — Z7951 Long term (current) use of inhaled steroids: Secondary | ICD-10-CM | POA: Diagnosis not present

## 2016-11-19 DIAGNOSIS — J441 Chronic obstructive pulmonary disease with (acute) exacerbation: Secondary | ICD-10-CM | POA: Diagnosis not present

## 2016-11-19 DIAGNOSIS — R69 Illness, unspecified: Secondary | ICD-10-CM | POA: Diagnosis not present

## 2016-11-19 DIAGNOSIS — I110 Hypertensive heart disease with heart failure: Secondary | ICD-10-CM | POA: Diagnosis not present

## 2016-11-19 DIAGNOSIS — Z951 Presence of aortocoronary bypass graft: Secondary | ICD-10-CM

## 2016-11-19 DIAGNOSIS — Z48812 Encounter for surgical aftercare following surgery on the circulatory system: Secondary | ICD-10-CM | POA: Diagnosis not present

## 2016-11-19 DIAGNOSIS — R1310 Dysphagia, unspecified: Secondary | ICD-10-CM | POA: Diagnosis not present

## 2016-11-19 DIAGNOSIS — Z7982 Long term (current) use of aspirin: Secondary | ICD-10-CM | POA: Diagnosis not present

## 2016-11-19 DIAGNOSIS — I480 Paroxysmal atrial fibrillation: Secondary | ICD-10-CM

## 2016-11-19 DIAGNOSIS — I2510 Atherosclerotic heart disease of native coronary artery without angina pectoris: Secondary | ICD-10-CM | POA: Diagnosis not present

## 2016-11-19 DIAGNOSIS — I5043 Acute on chronic combined systolic (congestive) and diastolic (congestive) heart failure: Secondary | ICD-10-CM

## 2016-11-19 NOTE — Telephone Encounter (Signed)
Needs Amiodarone / Atorvastatin sent to [facility] / tg

## 2016-11-20 MED ORDER — ATORVASTATIN CALCIUM 40 MG PO TABS: 40.0000 mg | ORAL_TABLET | Freq: Every day | ORAL | 3 refills | Status: DC

## 2016-11-20 MED ORDER — AMIODARONE HCL 200 MG PO TABS: 200.0000 mg | ORAL_TABLET | Freq: Every day | ORAL | 3 refills | Status: DC

## 2016-11-20 NOTE — Telephone Encounter (Signed)
Done

## 2016-11-21 DIAGNOSIS — R69 Illness, unspecified: Secondary | ICD-10-CM | POA: Diagnosis not present

## 2016-11-21 DIAGNOSIS — Z48812 Encounter for surgical aftercare following surgery on the circulatory system: Secondary | ICD-10-CM | POA: Diagnosis not present

## 2016-11-21 DIAGNOSIS — Z7982 Long term (current) use of aspirin: Secondary | ICD-10-CM | POA: Diagnosis not present

## 2016-11-21 DIAGNOSIS — I5022 Chronic systolic (congestive) heart failure: Secondary | ICD-10-CM | POA: Diagnosis not present

## 2016-11-21 DIAGNOSIS — I2510 Atherosclerotic heart disease of native coronary artery without angina pectoris: Secondary | ICD-10-CM | POA: Diagnosis not present

## 2016-11-21 DIAGNOSIS — Z951 Presence of aortocoronary bypass graft: Secondary | ICD-10-CM | POA: Diagnosis not present

## 2016-11-21 DIAGNOSIS — I110 Hypertensive heart disease with heart failure: Secondary | ICD-10-CM | POA: Diagnosis not present

## 2016-11-21 DIAGNOSIS — J441 Chronic obstructive pulmonary disease with (acute) exacerbation: Secondary | ICD-10-CM | POA: Diagnosis not present

## 2016-11-21 DIAGNOSIS — Z7951 Long term (current) use of inhaled steroids: Secondary | ICD-10-CM | POA: Diagnosis not present

## 2016-11-21 DIAGNOSIS — R1310 Dysphagia, unspecified: Secondary | ICD-10-CM | POA: Diagnosis not present

## 2016-11-28 ENCOUNTER — Telehealth: Payer: Self-pay | Admitting: Internal Medicine

## 2016-11-28 DIAGNOSIS — R69 Illness, unspecified: Secondary | ICD-10-CM | POA: Diagnosis not present

## 2016-11-28 MED ORDER — LINACLOTIDE 72 MCG PO CAPS
72.0000 ug | ORAL_CAPSULE | Freq: Every day | ORAL | 11 refills | Status: DC
Start: 2016-11-28 — End: 2017-01-11

## 2016-11-28 NOTE — Telephone Encounter (Signed)
[phone]  PATIENT CALLED AND STATED THE LINZESS SAMPLES DID GOOD AND NEEDS A [facility] [place]

## 2016-11-28 NOTE — Telephone Encounter (Signed)
Routing to the refill box. 

## 2016-11-28 NOTE — Addendum Note (Signed)
Addended by: Tiffany KocherLEWIS, Odaliz Mcqueary S on: 11/28/2016 07:05 PM   Modules accepted: Orders

## 2016-11-28 NOTE — Telephone Encounter (Signed)
rx done

## 2016-11-29 ENCOUNTER — Other Ambulatory Visit: Payer: Self-pay | Admitting: Thoracic Surgery (Cardiothoracic Vascular Surgery)

## 2016-11-29 ENCOUNTER — Ambulatory Visit (INDEPENDENT_AMBULATORY_CARE_PROVIDER_SITE_OTHER): Payer: Medicare HMO | Admitting: Cardiology

## 2016-11-29 ENCOUNTER — Encounter: Payer: Self-pay | Admitting: Cardiology

## 2016-11-29 VITALS — BP 102/60 | HR 83 | Ht 68.0 in | Wt 118.0 lb

## 2016-11-29 DIAGNOSIS — J441 Chronic obstructive pulmonary disease with (acute) exacerbation: Secondary | ICD-10-CM | POA: Diagnosis not present

## 2016-11-29 DIAGNOSIS — I5022 Chronic systolic (congestive) heart failure: Secondary | ICD-10-CM

## 2016-11-29 DIAGNOSIS — I2510 Atherosclerotic heart disease of native coronary artery without angina pectoris: Secondary | ICD-10-CM | POA: Diagnosis not present

## 2016-11-29 DIAGNOSIS — Z7982 Long term (current) use of aspirin: Secondary | ICD-10-CM | POA: Diagnosis not present

## 2016-11-29 DIAGNOSIS — Z7951 Long term (current) use of inhaled steroids: Secondary | ICD-10-CM | POA: Diagnosis not present

## 2016-11-29 DIAGNOSIS — I9789 Other postprocedural complications and disorders of the circulatory system, not elsewhere classified: Secondary | ICD-10-CM | POA: Diagnosis not present

## 2016-11-29 DIAGNOSIS — R69 Illness, unspecified: Secondary | ICD-10-CM | POA: Diagnosis not present

## 2016-11-29 DIAGNOSIS — Z48812 Encounter for surgical aftercare following surgery on the circulatory system: Secondary | ICD-10-CM | POA: Diagnosis not present

## 2016-11-29 DIAGNOSIS — I110 Hypertensive heart disease with heart failure: Secondary | ICD-10-CM | POA: Diagnosis not present

## 2016-11-29 DIAGNOSIS — I4891 Unspecified atrial fibrillation: Secondary | ICD-10-CM | POA: Diagnosis not present

## 2016-11-29 DIAGNOSIS — Z951 Presence of aortocoronary bypass graft: Secondary | ICD-10-CM | POA: Diagnosis not present

## 2016-11-29 DIAGNOSIS — R1310 Dysphagia, unspecified: Secondary | ICD-10-CM | POA: Diagnosis not present

## 2016-11-29 MED ORDER — LISINOPRIL 2.5 MG PO TABS: 2.5000 mg | ORAL_TABLET | Freq: Every day | ORAL | 3 refills | Status: DC

## 2016-11-29 MED ORDER — METOPROLOL SUCCINATE ER 25 MG PO TB24: 12.5000 mg | ORAL_TABLET | Freq: Every day | ORAL | 3 refills | Status: DC

## 2016-11-29 NOTE — Progress Notes (Signed)
Clinical Summary Devin Barton is a 71 y.o.male seen today for follow up of the following medical problems.   1. CAD/ chronic systolic HF - admitted to [facility] in June 2018 - EKG with LBBB thought to be chronic - treated for COPD exacerbation with plans for outpatient cardiology evaluation - seen by Dr Antoine Poche, referred for [facility] - 08/2016 Lexiscan: large area of scar as reported below, no significant ischemia. LVEF 30-44%. High risk due to low LVEF and scar - 08/2016: LVEF 25-30%, severe hypokinesis of the anteroseptal and apical myocardium. No definite LV mural thrombus but images are somewhat limited, could consider aDefinity contrast study.  - last visit referred for cath. 09/2016 cath report below, patient with severe 3 vessel CAD. Referred for CABG - CABG 10/05/16 with LIMA-LAD,SVG-OM1, SVG-acute marginal and sequential to PDA).   - admitted late July with acute on chronic systolic HF. Diuresed roughly 4 liters, discharge weight 117 lbs - stable SOB. No recent LE edmea.  - pressure like pain midchest, 3-4/10. Not positional. Lasts about 30 minutes. 3-4 times a night. Better with breathing treatments - compliant with meds. Has been on lasix  day. Home weights down to 114 lbs, down from 117lbs at discharge.  -reports Toprol changed back to lopressor. I suspect due to hemodynamics during recent surgery   - changed back to Toprol last visit.No new side effects.  - no LE edema. Home weights stable around 118 lbs.   2. COPD - followed by pcp - last seen by Dr Sherene Sires 07/2007. Notes mention severe COPD. Had been on advair but no longer taking, only has rescue inhaler - followed by Dr Juanetta Gosling  3. Leg swelling - 10/16/16 with superficial thrombus at site of vein harvest, no DVT  4. Postop afib - occurred after CABG. Started on IV amio and then oral amio - Not committed to anticoag at this time  - no recent palpitations since last visit   5. Urinary retention -  followed by urology  6. Carotid stenosis - RICA 1-39%, LICA 60-79%.  - no neuro symptoms  Past Medical History:  Diagnosis Date  . Asthma   . CAD (coronary artery disease)    Status post CABG July 2018  . Cigarette smoker   . COPD (chronic obstructive pulmonary disease) (HCC)   . GERD (gastroesophageal reflux disease)      Allergies  Allergen Reactions  . No Known Allergies      Current Outpatient Prescriptions  Medication Sig Dispense Refill  . amiodarone (PACERONE) 200 MG tablet Take 1 tablet (200 mg total) by mouth daily. 30 tablet 3  . arformoterol (BROVANA) 15 MCG/2ML NEBU Take 2 mLs (15 mcg total) by nebulization 2 (two) times daily. 120 mL 0  . aspirin EC 325 MG EC tablet Take 1 tablet (325 mg total) by mouth daily. 30 tablet 0  . atorvastatin (LIPITOR) 40 MG tablet Take 1 tablet (40 mg total) by mouth daily at 6 PM. 30 tablet 3  . budesonide (PULMICORT) 0.25 MG/2ML nebulizer solution Take 2 mLs (0.25 mg total) by nebulization 2 (two) times daily. 60 mL 12  . feeding supplement, ENSURE ENLIVE, (ENSURE ENLIVE) LIQD Take 237 mLs by mouth 2 (two) times daily between meals. 237 mL 12  . finasteride (PROSCAR) 5 MG tablet Take 5 mg by mouth daily.    . furosemide (LASIX) 40 MG tablet TAKE 40 MG DAILY. mAY TAKE AN ADDITIONAL 40 MG FOR WEIGHT GAIN OR SWELLING. 60 tablet 3  .  guaiFENesin (MUCINEX) 600 MG 12 hr tablet Take 600 mg by mouth daily.    Marland Kitchen. ipratropium (ATROVENT) 0.02 % nebulizer solution Take 0.5 mg by nebulization daily as needed for wheezing or shortness of breath.     . linaclotide (LINZESS) 72 MCG capsule Take 1 capsule (72 mcg total) by mouth daily before breakfast. 30 capsule 11  . Maltodextrin-Xanthan Gum (RESOURCE THICKENUP CLEAR) POWD As needed for a month supple 5 Can 0  . metoprolol succinate (TOPROL XL) 25 MG 24 hr tablet Take 1 tablet (25 mg total) by mouth daily. 90 tablet 3  . nicotine (NICODERM CQ - DOSED IN MG/24 HR) 7 mg/24hr patch Place 7 mg onto the  skin daily.    Marland Kitchen. omeprazole (PRILOSEC) 40 MG capsule Take 40 mg by mouth daily.    . potassium chloride SA (K-DUR,KLOR-CON) 20 MEQ tablet Take 1 tablet (20 mEq total) by mouth daily. 30 tablet 3  . traMADol (ULTRAM) 50 MG tablet TAKE 1 TABLET BY MOUTH EVERY 6 HOURS AS NEEDED FOR MODERATE PAIN 28 tablet 0   No current facility-administered medications for this visit.      Past Surgical History:  Procedure Laterality Date  . ABDOMINAL HERNIA REPAIR    . BOWEL RESECTION     Bowel perf secondary to trauma  . CARDIAC CATHETERIZATION    . CORONARY ARTERY BYPASS GRAFT N/A 10/05/2016   Procedure: CORONARY ARTERY BYPASS GRAFTING (CABG) x4 with FREE MAMMARY.  ENDOSCOPIC HARVESTING OF RIGHT SAPHENOUS VEIN. (FREE LIMA to LAD, SVG to OM, SVG SEQUENTIALLY to ACUTE MARGINAL and PDA);  Surgeon: Loreli SlotHendrickson, Steven C, MD;  [facility];  Service: Open Heart Surgery;  Laterality: N/A;  . EXPLORATORY LAPAROTOMY  1986   [name]/notes 08/08/2010  . FOOT FRACTURE SURGERY Left ~ 1965  . FRACTURE SURGERY    . HERNIA REPAIR    . INCISIONAL HERNIA REPAIR  05/2005   [name]/notes 08/08/2010  . INGUINAL HERNIA REPAIR Bilateral   . INGUINAL HERNIA REPAIR Right 09/2006   recurrent/notes 07/27/2010  . ORIF FOOT FRACTURE Left 1990   [name]/notes 08/08/2010  . RIGHT/LEFT HEART CATH AND CORONARY ANGIOGRAPHY N/A 10/04/2016   Procedure: Right/Left Heart Cath and Coronary Angiography;  Surgeon: Corky CraftsVaranasi, Jayadeep S, MD;  [facility];  Service: Cardiovascular;  Laterality: N/A;  . TEE WITHOUT CARDIOVERSION N/A 10/05/2016   Procedure: TRANSESOPHAGEAL ECHOCARDIOGRAM (TEE);  Surgeon: Loreli SlotHendrickson, Steven C, MD;  [facility];  Service: Open Heart Surgery;  Laterality: N/A;  . VENTRAL HERNIA REPAIR  09/2006   recurrent/notes 07/27/2010     Allergies  Allergen Reactions  . No Known Allergies       Family History  Problem Relation Age of Onset  . Heart disease Mother        No details  . Alzheimer's disease Mother   .  Atopy Neg Hx   . Colon cancer Neg Hx      Social History Mr. [name] reports that he has quit smoking. His smoking use included Cigarettes. He has a 55.00 pack-year smoking history. He has never used smokeless tobacco. Mr. [name] reports that he does not drink alcohol.   Review of Systems CONSTITUTIONAL: No weight loss, fever, chills, weakness or fatigue.  HEENT: Eyes: No visual loss, blurred vision, double vision or yellow sclerae.No hearing loss, sneezing, congestion, runny nose or sore throat.  SKIN: No rash or itching.  CARDIOVASCULAR: per hpi RESPIRATORY: No shortness of breath, cough or sputum.  GASTROINTESTINAL: No anorexia, nausea, vomiting  or diarrhea. No abdominal pain or blood.  GENITOURINARY: No burning on urination, no polyuria NEUROLOGICAL: No headache, dizziness, syncope, paralysis, ataxia, numbness or tingling in the extremities. No change in bowel or bladder control.  MUSCULOSKELETAL: No muscle, back pain, joint pain or stiffness.  LYMPHATICS: No enlarged nodes. No history of splenectomy.  PSYCHIATRIC: No history of depression or anxiety.  ENDOCRINOLOGIC: No reports of sweating, cold or heat intolerance. No polyuria or polydipsia.  Marland Kitchen   Physical Examination Vitals:   11/29/16 0836  BP: 102/60  Pulse: 83  SpO2: 95%   Vitals:   11/29/16 0836  Weight: 118 lb (53.5 kg)  Height:  (1.727 m)    Gen: resting comfortably, no acute distress HEENT: no scleral icterus, pupils equal round and reactive, no palptable cervical adenopathy,  CV: RRR, no m/r/g, no jvd Resp: Clear to auscultation bilaterally GI: abdomen is soft, non-tender, non-distended, normal bowel sounds, no hepatosplenomegaly MSK: extremities are warm, no edema.  Skin: warm, no rash Neuro:  no focal deficits Psych: appropriate affect   Diagnostic Studies 08/2016 Lexiscan MPI  There was no ST segment deviation noted during stress.  Findings consistent with large extensive prior myocardial  infarctions of the apex, inferior wall, inferoseptal wall, septal wall, and anteroseptal wall.There is no significant current ischemia  This is a high risk study. High risk based on large scar burden and decreased LVEF. There is no significant myocardium currently at jeopardy. Consider correlating LVEF with echo.  The left ventricular ejection fraction is moderately decreased (30-44%).   08/2016 echo Study Conclusions  - Left ventricle: The cavity size was normal. Wall thickness was increased in a pattern of mild LVH. Systolic function was severely reduced. The estimated ejection fraction was in the range of 25% to 30%. Abnormal global longitudinal strain of -11.3%. Diffuse hypokinesis. There is severe hypokinesis of the anteroseptal and apical myocardium. Doppler parameters are consistent with abnormal left ventricular relaxation (grade 1 diastolic dysfunction). - Ventricular septum: Septal motion showed abnormal function and dyssynergy. - Aortic valve: Mildly calcified annulus. Trileaflet; mildly calcified leaflets. Valve area (Vmax): 1.89 cm^2. - Mitral valve: Calcified annulus. Mildly thickened leaflets . There was mild regurgitation. - Right atrium: Central venous pressure (est): 3 mm Hg. - Atrial septum: No defect or patent foramen ovale was identified. - Tricuspid valve: There was trivial regurgitation. - Pulmonary arteries: PA peak pressure: 19 mm Hg (S). - Pericardium, extracardiac: There was no pericardial effusion.  Impressions:  - Mild LVH with LVEF approximately 25-30%. There is diffuse hypokinesis with septal dyssynergy and hypokinesis of the anteroseptal and apical myocardium. 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 Grade 1 diastolic dysfunction. No definite LV mural thrombus but images are somewhat limited, could consider a Definity contrast study. Mildly calcified mitral annulus with mildly thickened leaflets and mild mitral  regurgitation. Mildly sclerotic aortic valve. Trivial tricuspid regurgitation with PASP estimated 19 mmHg.  09/2016 cath  Prox RCA lesion, 100 %stenosed.  LM lesion, 75 %stenosed.  Ost 1st Mrg lesion, 75 %stenosed.  Ost LAD to Prox LAD lesion, 90 %stenosed.  Mid LAD lesion, 75 %stenosed.  LV end diastolic pressure is low.  There is no aortic valve stenosis.  LV end diastolic pressure is normal.  Normal PA pressures. Ao Sat 90%. PA sat 61%. CO 4.2 L.min; CI 2.4  Right radial loop.  Severe three vessel CAD. Known LV dysfunction from noninvasive testing, but well compensated. Due to high risk anatomy, will admit the patient and start IV heparin. Plan for cardiac  surgery consult.   09/2016 TEE  Left ventricle: Normal left ventricular diastolic function and left atrial pressure. Cavity is mildly dilated. Thin-walled ventricle. LV systolic function is moderately reduced with an EF of 35-40%. Wall motion is abnormal. Mid, anteroseptal wall motion is dyskinetic.  Septum: Abnormal ventricular septal motion consistent with post-operative state. Small membranous ventricular septal defect present with left to right shunting.  Aortic valve: The valve is trileaflet. Mild valve thickening present. Mild valve calcification present. Trace regurgitation.  Mitral valve: Mild regurgitation.  Tricuspid valve: Mild regurgitation. The tricuspid valve regurgitation jet is central.    Assessment and Plan  1. CAD/chronic systolic HF/ICM - change Toprol XL to 12.5mg  daily, start lisinopril 2.5mg  daily. Check BMET in 2 weeks - submit bp log in 1 week  2. Postop afib - stop amio, no noted recurrence - continue to monitor    F/u 2 months  Antoine Poche, M.D.

## 2016-11-29 NOTE — Patient Instructions (Signed)
Medication Instructions:  STOP AMIODARONE DECREASE TOPROL XL TO 12.5 MG DAILY   START LISINOPRIL 2.5 MG DAILY  Labwork: 2 WEEKS  BMET  Testing/Procedures: NONE  Follow-Up: Your physician recommends that you schedule a follow-up appointment in: 2 MONTHS    Any Other Special Instructions Will Be Listed Below (If Applicable).  PLEASE KEEP A BLOOD PRESSURE LOG FOR 1 WEEK, AND CALL TO UPDATE US ON BLOOD PRESSURES    If you need a refill on your cardiac medications before your next appointment, please call your pharmacy.

## 2016-11-30 DIAGNOSIS — I2510 Atherosclerotic heart disease of native coronary artery without angina pectoris: Secondary | ICD-10-CM | POA: Diagnosis not present

## 2016-11-30 DIAGNOSIS — R69 Illness, unspecified: Secondary | ICD-10-CM | POA: Diagnosis not present

## 2016-11-30 DIAGNOSIS — Z7951 Long term (current) use of inhaled steroids: Secondary | ICD-10-CM | POA: Diagnosis not present

## 2016-11-30 DIAGNOSIS — Z951 Presence of aortocoronary bypass graft: Secondary | ICD-10-CM | POA: Diagnosis not present

## 2016-11-30 DIAGNOSIS — I110 Hypertensive heart disease with heart failure: Secondary | ICD-10-CM | POA: Diagnosis not present

## 2016-11-30 DIAGNOSIS — I5022 Chronic systolic (congestive) heart failure: Secondary | ICD-10-CM | POA: Diagnosis not present

## 2016-11-30 DIAGNOSIS — J441 Chronic obstructive pulmonary disease with (acute) exacerbation: Secondary | ICD-10-CM | POA: Diagnosis not present

## 2016-11-30 DIAGNOSIS — Z48812 Encounter for surgical aftercare following surgery on the circulatory system: Secondary | ICD-10-CM | POA: Diagnosis not present

## 2016-11-30 DIAGNOSIS — R1310 Dysphagia, unspecified: Secondary | ICD-10-CM | POA: Diagnosis not present

## 2016-11-30 DIAGNOSIS — Z7982 Long term (current) use of aspirin: Secondary | ICD-10-CM | POA: Diagnosis not present

## 2016-12-03 DIAGNOSIS — R69 Illness, unspecified: Secondary | ICD-10-CM | POA: Diagnosis not present

## 2016-12-04 DIAGNOSIS — R1310 Dysphagia, unspecified: Secondary | ICD-10-CM | POA: Diagnosis not present

## 2016-12-04 DIAGNOSIS — I10 Essential (primary) hypertension: Secondary | ICD-10-CM | POA: Diagnosis not present

## 2016-12-04 DIAGNOSIS — Z7951 Long term (current) use of inhaled steroids: Secondary | ICD-10-CM | POA: Diagnosis not present

## 2016-12-04 DIAGNOSIS — Z48812 Encounter for surgical aftercare following surgery on the circulatory system: Secondary | ICD-10-CM | POA: Diagnosis not present

## 2016-12-04 DIAGNOSIS — J449 Chronic obstructive pulmonary disease, unspecified: Secondary | ICD-10-CM | POA: Diagnosis not present

## 2016-12-04 DIAGNOSIS — I2510 Atherosclerotic heart disease of native coronary artery without angina pectoris: Secondary | ICD-10-CM | POA: Diagnosis not present

## 2016-12-04 DIAGNOSIS — Z7982 Long term (current) use of aspirin: Secondary | ICD-10-CM | POA: Diagnosis not present

## 2016-12-04 DIAGNOSIS — J441 Chronic obstructive pulmonary disease with (acute) exacerbation: Secondary | ICD-10-CM | POA: Diagnosis not present

## 2016-12-04 DIAGNOSIS — Z951 Presence of aortocoronary bypass graft: Secondary | ICD-10-CM | POA: Diagnosis not present

## 2016-12-04 DIAGNOSIS — N401 Enlarged prostate with lower urinary tract symptoms: Secondary | ICD-10-CM | POA: Diagnosis not present

## 2016-12-04 DIAGNOSIS — I110 Hypertensive heart disease with heart failure: Secondary | ICD-10-CM | POA: Diagnosis not present

## 2016-12-04 DIAGNOSIS — R69 Illness, unspecified: Secondary | ICD-10-CM | POA: Diagnosis not present

## 2016-12-04 DIAGNOSIS — I5022 Chronic systolic (congestive) heart failure: Secondary | ICD-10-CM | POA: Diagnosis not present

## 2016-12-05 ENCOUNTER — Encounter (HOSPITAL_COMMUNITY): Payer: Medicare HMO | Admitting: Speech Pathology

## 2016-12-05 ENCOUNTER — Ambulatory Visit (INDEPENDENT_AMBULATORY_CARE_PROVIDER_SITE_OTHER): Payer: Medicare HMO | Admitting: Urology

## 2016-12-05 DIAGNOSIS — R338 Other retention of urine: Secondary | ICD-10-CM

## 2016-12-06 ENCOUNTER — Telehealth: Payer: Self-pay | Admitting: *Deleted

## 2016-12-06 ENCOUNTER — Telehealth: Payer: Self-pay | Admitting: Cardiology

## 2016-12-06 DIAGNOSIS — Z48812 Encounter for surgical aftercare following surgery on the circulatory system: Secondary | ICD-10-CM | POA: Diagnosis not present

## 2016-12-06 DIAGNOSIS — I2510 Atherosclerotic heart disease of native coronary artery without angina pectoris: Secondary | ICD-10-CM | POA: Diagnosis not present

## 2016-12-06 DIAGNOSIS — Z951 Presence of aortocoronary bypass graft: Secondary | ICD-10-CM | POA: Diagnosis not present

## 2016-12-06 DIAGNOSIS — R69 Illness, unspecified: Secondary | ICD-10-CM | POA: Diagnosis not present

## 2016-12-06 DIAGNOSIS — Z7982 Long term (current) use of aspirin: Secondary | ICD-10-CM | POA: Diagnosis not present

## 2016-12-06 DIAGNOSIS — J441 Chronic obstructive pulmonary disease with (acute) exacerbation: Secondary | ICD-10-CM | POA: Diagnosis not present

## 2016-12-06 DIAGNOSIS — I5022 Chronic systolic (congestive) heart failure: Secondary | ICD-10-CM | POA: Diagnosis not present

## 2016-12-06 DIAGNOSIS — I110 Hypertensive heart disease with heart failure: Secondary | ICD-10-CM | POA: Diagnosis not present

## 2016-12-06 DIAGNOSIS — R1310 Dysphagia, unspecified: Secondary | ICD-10-CM | POA: Diagnosis not present

## 2016-12-06 DIAGNOSIS — Z7951 Long term (current) use of inhaled steroids: Secondary | ICD-10-CM | POA: Diagnosis not present

## 2016-12-06 NOTE — Telephone Encounter (Signed)
Dr Juanetta GoslingHawkins prescribed pt Devin PatchesANORO ELLIPTA daily use - [facility] to make sure this was ok from cardiac point - says one of side affects is abnormal heart rhythm - pt was seen 11/29/16 here and this was not listed on medication.

## 2016-12-06 NOTE — Telephone Encounter (Signed)
Numbers look good, no change   Dominga FerryJ Devin Quintin MD

## 2016-12-06 NOTE — Telephone Encounter (Signed)
Olegario MessierKathy made aware - says also that pt weight was up 4lbs from last night and pt had taken 80 mg of lasix today - will update tomorrow on weights if has not gone down - denies any symptoms at this time.

## 2016-12-06 NOTE — Telephone Encounter (Signed)
Patient and wife notified.  

## 2016-12-06 NOTE — Telephone Encounter (Signed)
Will forward to Dr. Branch. 

## 2016-12-06 NOTE — Telephone Encounter (Signed)
pts wife called in BP readings  11-29-16- 118/60 11-30-16- 128/64 12-01-16- 116/60 12-02-16- 120/62 12-03-16- 119/60 12-04-16- 118/66 12-05-16- 120/70 12-06-16- 122/62

## 2016-12-06 NOTE — Telephone Encounter (Signed)
Ok to use that inhaler   Dominga FerryJ Noretta Frier MD

## 2016-12-10 ENCOUNTER — Other Ambulatory Visit: Payer: Self-pay | Admitting: Urology

## 2016-12-10 DIAGNOSIS — I110 Hypertensive heart disease with heart failure: Secondary | ICD-10-CM | POA: Diagnosis not present

## 2016-12-10 DIAGNOSIS — I5022 Chronic systolic (congestive) heart failure: Secondary | ICD-10-CM | POA: Diagnosis not present

## 2016-12-10 DIAGNOSIS — R69 Illness, unspecified: Secondary | ICD-10-CM | POA: Diagnosis not present

## 2016-12-10 DIAGNOSIS — I2510 Atherosclerotic heart disease of native coronary artery without angina pectoris: Secondary | ICD-10-CM | POA: Diagnosis not present

## 2016-12-10 DIAGNOSIS — Z7951 Long term (current) use of inhaled steroids: Secondary | ICD-10-CM | POA: Diagnosis not present

## 2016-12-10 DIAGNOSIS — Z48812 Encounter for surgical aftercare following surgery on the circulatory system: Secondary | ICD-10-CM | POA: Diagnosis not present

## 2016-12-10 DIAGNOSIS — Z7982 Long term (current) use of aspirin: Secondary | ICD-10-CM | POA: Diagnosis not present

## 2016-12-10 DIAGNOSIS — R1310 Dysphagia, unspecified: Secondary | ICD-10-CM | POA: Diagnosis not present

## 2016-12-10 DIAGNOSIS — J441 Chronic obstructive pulmonary disease with (acute) exacerbation: Secondary | ICD-10-CM | POA: Diagnosis not present

## 2016-12-10 DIAGNOSIS — Z951 Presence of aortocoronary bypass graft: Secondary | ICD-10-CM | POA: Diagnosis not present

## 2016-12-12 ENCOUNTER — Encounter (HOSPITAL_COMMUNITY): Payer: Medicare HMO | Admitting: Speech Pathology

## 2016-12-13 ENCOUNTER — Telehealth: Payer: Self-pay | Admitting: Cardiology

## 2016-12-13 ENCOUNTER — Other Ambulatory Visit (HOSPITAL_COMMUNITY)
Admission: RE | Admit: 2016-12-13 | Discharge: 2016-12-13 | Disposition: A | Discharge status: Home / Self Care | Payer: Medicare HMO | Source: Ambulatory Visit | Attending: Cardiology | Admitting: Cardiology

## 2016-12-13 DIAGNOSIS — J441 Chronic obstructive pulmonary disease with (acute) exacerbation: Secondary | ICD-10-CM | POA: Diagnosis not present

## 2016-12-13 DIAGNOSIS — Z7951 Long term (current) use of inhaled steroids: Secondary | ICD-10-CM | POA: Diagnosis not present

## 2016-12-13 DIAGNOSIS — J449 Chronic obstructive pulmonary disease, unspecified: Secondary | ICD-10-CM | POA: Diagnosis not present

## 2016-12-13 DIAGNOSIS — R69 Illness, unspecified: Secondary | ICD-10-CM | POA: Diagnosis not present

## 2016-12-13 DIAGNOSIS — I480 Paroxysmal atrial fibrillation: Secondary | ICD-10-CM | POA: Diagnosis not present

## 2016-12-13 DIAGNOSIS — R1310 Dysphagia, unspecified: Secondary | ICD-10-CM | POA: Diagnosis not present

## 2016-12-13 DIAGNOSIS — Z7982 Long term (current) use of aspirin: Secondary | ICD-10-CM | POA: Diagnosis not present

## 2016-12-13 DIAGNOSIS — I2510 Atherosclerotic heart disease of native coronary artery without angina pectoris: Secondary | ICD-10-CM | POA: Diagnosis not present

## 2016-12-13 DIAGNOSIS — Z951 Presence of aortocoronary bypass graft: Secondary | ICD-10-CM | POA: Diagnosis not present

## 2016-12-13 DIAGNOSIS — I5022 Chronic systolic (congestive) heart failure: Secondary | ICD-10-CM | POA: Diagnosis not present

## 2016-12-13 DIAGNOSIS — I110 Hypertensive heart disease with heart failure: Secondary | ICD-10-CM | POA: Diagnosis not present

## 2016-12-13 DIAGNOSIS — I5021 Acute systolic (congestive) heart failure: Secondary | ICD-10-CM | POA: Diagnosis not present

## 2016-12-13 DIAGNOSIS — Z48812 Encounter for surgical aftercare following surgery on the circulatory system: Secondary | ICD-10-CM | POA: Diagnosis not present

## 2016-12-13 LAB — BASIC METABOLIC PANEL
ANION GAP: 7 (ref 5–15)
BUN: 20 mg/dL (ref 6–20)
CHLORIDE: 95 mmol/L — AB (ref 101–111)
CO2: 29 mmol/L (ref 22–32)
Calcium: 8.9 mg/dL (ref 8.9–10.3)
Creatinine, Ser: 0.87 mg/dL (ref 0.61–1.24)
GFR calc non Af Amer: >60 mL/min (ref >60)
Glucose, Bld: 103 mg/dL — ABNORMAL HIGH (ref 65–99)
POTASSIUM: 4.8 mmol/L (ref 3.5–5.1)
SODIUM: 131 mmol/L — AB (ref 135–145)

## 2016-12-13 NOTE — Telephone Encounter (Signed)
Patient has gained 9 lbs over past 9 days. From 111 lbs to 120 lbs. Please adivse on instructions. / tg

## 2016-12-13 NOTE — Telephone Encounter (Signed)
Spoke with wife who states that pt has had an increase in appetite. He enjoys eating brunswick stew with chocolate milk seasoned with salt, pepper and sugar. Daily wts are as follows... 9/12 115.2lb, 9/13  119.8lb , 9/14 118.4lb , 9/15 119.4 lb, 9/16 117 lb, 9/17 119 lb, 9/18 120 lb, 9/19 119 lb, 9/20 120.4lb   Pt's wife denies that pt has increased swelling or SOB at this time. She states that she was told to call when weight became 120 lb or higher.

## 2016-12-14 NOTE — Telephone Encounter (Signed)
Devin Barton notified updated and voiced understanding

## 2016-12-14 NOTE — Telephone Encounter (Signed)
Wife states that she has been given pt 80 mg Lasix since 12/08/16 today's wt is 121.0lb.

## 2016-12-14 NOTE — Telephone Encounter (Signed)
Verify he is taking lasix  daily and if so take lasix  bid x 2 days then back to  daily, update [place] on weights on Monday  Dominga Ferry MD

## 2016-12-14 NOTE — Telephone Encounter (Signed)
Take lasix  in AM and  in PM Sat and Sunday,update [place] on Monday   Dominga Ferry MD

## 2016-12-16 ENCOUNTER — Other Ambulatory Visit: Payer: Self-pay | Admitting: Thoracic Surgery (Cardiothoracic Vascular Surgery)

## 2016-12-18 ENCOUNTER — Telehealth: Payer: Self-pay | Admitting: Cardiology

## 2016-12-18 DIAGNOSIS — R1310 Dysphagia, unspecified: Secondary | ICD-10-CM | POA: Diagnosis not present

## 2016-12-18 DIAGNOSIS — Z951 Presence of aortocoronary bypass graft: Secondary | ICD-10-CM | POA: Diagnosis not present

## 2016-12-18 DIAGNOSIS — Z48812 Encounter for surgical aftercare following surgery on the circulatory system: Secondary | ICD-10-CM | POA: Diagnosis not present

## 2016-12-18 DIAGNOSIS — J441 Chronic obstructive pulmonary disease with (acute) exacerbation: Secondary | ICD-10-CM | POA: Diagnosis not present

## 2016-12-18 DIAGNOSIS — R69 Illness, unspecified: Secondary | ICD-10-CM | POA: Diagnosis not present

## 2016-12-18 DIAGNOSIS — I5022 Chronic systolic (congestive) heart failure: Secondary | ICD-10-CM | POA: Diagnosis not present

## 2016-12-18 DIAGNOSIS — I2510 Atherosclerotic heart disease of native coronary artery without angina pectoris: Secondary | ICD-10-CM | POA: Diagnosis not present

## 2016-12-18 DIAGNOSIS — Z7982 Long term (current) use of aspirin: Secondary | ICD-10-CM | POA: Diagnosis not present

## 2016-12-18 DIAGNOSIS — I110 Hypertensive heart disease with heart failure: Secondary | ICD-10-CM | POA: Diagnosis not present

## 2016-12-18 DIAGNOSIS — Z7951 Long term (current) use of inhaled steroids: Secondary | ICD-10-CM | POA: Diagnosis not present

## 2016-12-18 NOTE — Telephone Encounter (Signed)
Pt took extra lasix over weekend as instructed, now weight has crept back up to 121 lbs  Wife gave him 80 mg this am and wonders if she can give him more

## 2016-12-18 NOTE — Telephone Encounter (Signed)
Pt did take as directed over wkend 60 mg am, 40 mg pm of lasix

## 2016-12-18 NOTE — Telephone Encounter (Signed)
Patient's wife calling regarding patient's weight gain after changing dosage of medications. / tg

## 2016-12-18 NOTE — Telephone Encounter (Signed)
Take  in AM and  in PM starting tomorrow x 2 days, update [place] Friday. Verify he took  in Am and  in PM over the weekend   Dominga Ferry MD

## 2016-12-20 DIAGNOSIS — R1310 Dysphagia, unspecified: Secondary | ICD-10-CM | POA: Diagnosis not present

## 2016-12-20 DIAGNOSIS — Z7982 Long term (current) use of aspirin: Secondary | ICD-10-CM | POA: Diagnosis not present

## 2016-12-20 DIAGNOSIS — Z48812 Encounter for surgical aftercare following surgery on the circulatory system: Secondary | ICD-10-CM | POA: Diagnosis not present

## 2016-12-20 DIAGNOSIS — Z7951 Long term (current) use of inhaled steroids: Secondary | ICD-10-CM | POA: Diagnosis not present

## 2016-12-20 DIAGNOSIS — J441 Chronic obstructive pulmonary disease with (acute) exacerbation: Secondary | ICD-10-CM | POA: Diagnosis not present

## 2016-12-20 DIAGNOSIS — Z951 Presence of aortocoronary bypass graft: Secondary | ICD-10-CM | POA: Diagnosis not present

## 2016-12-20 DIAGNOSIS — R69 Illness, unspecified: Secondary | ICD-10-CM | POA: Diagnosis not present

## 2016-12-20 DIAGNOSIS — I5022 Chronic systolic (congestive) heart failure: Secondary | ICD-10-CM | POA: Diagnosis not present

## 2016-12-20 DIAGNOSIS — I110 Hypertensive heart disease with heart failure: Secondary | ICD-10-CM | POA: Diagnosis not present

## 2016-12-20 DIAGNOSIS — I2510 Atherosclerotic heart disease of native coronary artery without angina pectoris: Secondary | ICD-10-CM | POA: Diagnosis not present

## 2016-12-21 ENCOUNTER — Telehealth: Payer: Self-pay | Admitting: Cardiology

## 2016-12-21 NOTE — Telephone Encounter (Signed)
I will forward to Dr Branch 

## 2016-12-21 NOTE — Telephone Encounter (Signed)
Pt notified, will call back on Monday with wts

## 2016-12-21 NOTE — Telephone Encounter (Signed)
12/18/16-- 121.4 12/19/16-- 119 12/20/16-- 117.4 12/21/16-- 115  Pt's weight for the week

## 2016-12-21 NOTE — Telephone Encounter (Signed)
I would continue lasix  in AM  in PM over the weekend, update [place] again on Monday  Dominga Ferry MD

## 2016-12-24 ENCOUNTER — Telehealth: Payer: Self-pay | Admitting: Cardiology

## 2016-12-24 HISTORY — PX: CYSTOSCOPY: SUR368

## 2016-12-24 NOTE — Telephone Encounter (Signed)
Wife called to report weight   12/22/16-116.6 12/23/16-117 12/24/16-117.8  Pt will also need a new Rx sent in for his Lasix, they won't refill the other Rx since he's already out

## 2016-12-24 NOTE — Telephone Encounter (Signed)
I will forward to Dr.Branch for review. 

## 2016-12-25 NOTE — Telephone Encounter (Signed)
I would continue lasix  I AM and  in pm, update [place] Monday again on his weights   Dominga Ferry MD

## 2016-12-25 NOTE — Telephone Encounter (Signed)
Wife will follow Dr Verna Czech lasix recommendations and call back next week

## 2016-12-31 ENCOUNTER — Encounter: Payer: Self-pay | Admitting: Gastroenterology

## 2016-12-31 ENCOUNTER — Ambulatory Visit (INDEPENDENT_AMBULATORY_CARE_PROVIDER_SITE_OTHER): Payer: Medicare HMO | Admitting: Gastroenterology

## 2016-12-31 VITALS — BP 98/62 | HR 76 | Temp 97.7°F | Ht 68.0 in | Wt 121.8 lb

## 2016-12-31 DIAGNOSIS — R634 Abnormal weight loss: Secondary | ICD-10-CM

## 2016-12-31 DIAGNOSIS — R1013 Epigastric pain: Secondary | ICD-10-CM

## 2016-12-31 DIAGNOSIS — K5900 Constipation, unspecified: Secondary | ICD-10-CM | POA: Diagnosis not present

## 2016-12-31 MED ORDER — LUBIPROSTONE 24 MCG PO CAPS: ORAL_CAPSULE | ORAL | 3 refills | Status: DC

## 2016-12-31 NOTE — Progress Notes (Signed)
Primary Care Physician: Sharilyn Sites, MD  Primary Gastroenterologist:  Garfield Cornea, MD   Chief Complaint  Patient presents with  . Constipation    x 3 months  . Weight Loss    HPI: Devin Barton is a 71 y.o. male hereFor follow-up. Last seen on 11/08/2017. Earlier in the year his PCP referred him for colonoscopy and upper endoscopy for heartburn. Before he was able to kit to the offices that he ended up with urgent CABG 3 on 10/05/2016. Course was complicated by readmission for acute respiratory failure/fluid overload and additionally by urinary retention. He is now scheduled to see urologist for significant BPH. Patient states he scheduled for a procedure, unable to be put to sleep so he will have local anesthesia.  Patient has had issues with dysphagia with severe retention of by mouth's and the pharynx and intermittent aspiration. Speech therapist noted concern for UES narrowing. Esophagram showed normal motility but no stricture, barium pill could not be minister this patient is able to swallow pills. Subsequently followed up with speech therapist couple of weeks ago. No longer requiring thickening agent. Still appears to have reduced relaxation of the UES resulting in significant pooling in the forms and trace penetration. Upper endoscopy and/or manometry recommended.  Patient has gained about 3 pounds since we last saw him. Primary complaint of not being able to have a bowel movement. Eating "okay". No nausea or vomiting. Has a bowel movement every few days. Takes Linzess 72 g every few days. Gripe's his stomach so he doesn't take daily. Continues to have hard stool. Complains of black stools all the time. Not just when he takes occasional Pepto. No bright red blood per rectum. For the past couple of months, he has not been able to lay flat on his back to sleep. Thin minutes of laying down he develops epigastric pain which resolves when he sits up.  Patient tells me that he  has not been approved to be "put to sleep". Recently cardiologist defer to PCP regarding anesthesia per patient's/wife.  Concern her progressive weight loss over the past 10 years. Weight 180 pounds at his highest. Weight 157 pounds in 2012, 140 pounds in 2014, 130 pounds in July. 117 pounds back in August but up to 121 pounds today.  Denies postprandial abdominal pain.    Current Outpatient Prescriptions  Medication Sig Dispense Refill  . acetaminophen (TYLENOL) 500 MG tablet Take 500 mg by mouth 2 (two) times daily.    Marland Kitchen arformoterol (BROVANA) 15 MCG/2ML NEBU Take 2 mLs (15 mcg total) by nebulization 2 (two) times daily. 120 mL 0  . aspirin EC 325 MG EC tablet Take 1 tablet (325 mg total) by mouth daily. 30 tablet 0  . atorvastatin (LIPITOR) 40 MG tablet Take 1 tablet (40 mg total) by mouth daily at 6 PM. 30 tablet 3  . budesonide (PULMICORT) 0.25 MG/2ML nebulizer solution Take 2 mLs (0.25 mg total) by nebulization 2 (two) times daily. (Patient taking differently: Take 0.25 mg by nebulization daily. ) 60 mL 12  . feeding supplement, ENSURE ENLIVE, (ENSURE ENLIVE) LIQD Take 237 mLs by mouth 2 (two) times daily between meals. 237 mL 12  . finasteride (PROSCAR) 5 MG tablet Take 5 mg by mouth daily.    . furosemide (LASIX) 40 MG tablet TAKE 40 MG DAILY. mAY TAKE AN ADDITIONAL 40 MG FOR WEIGHT GAIN OR SWELLING. (Patient taking differently: Takes 2 in the AM and 1 1/2 at night.  mAY TAKE AN ADDITIONAL 40 MG FOR WEIGHT GAIN OR SWELLING.) 60 tablet 3  . guaiFENesin (MUCINEX) 600 MG 12 hr tablet Take 600 mg by mouth daily.    Marland Kitchen ipratropium (ATROVENT) 0.02 % nebulizer solution Take 0.5 mg by nebulization daily as needed for wheezing or shortness of breath.     . linaclotide (LINZESS) 72 MCG capsule Take 1 capsule (72 mcg total) by mouth daily before breakfast. (Patient taking differently: Take 72 mcg by mouth as needed. ) 30 capsule 11  . lisinopril (PRINIVIL,ZESTRIL) 2.5 MG tablet Take 1 tablet (2.5 mg  total) by mouth daily. 90 tablet 3  . metoprolol succinate (TOPROL XL) 25 MG 24 hr tablet Take 0.5 tablets (12.5 mg total) by mouth daily. 45 tablet 3  . omeprazole (PRILOSEC) 40 MG capsule Take 40 mg by mouth daily.    . potassium chloride SA (K-DUR,KLOR-CON) 20 MEQ tablet Take 1 tablet (20 mEq total) by mouth daily. 30 tablet 3   No current facility-administered medications for this visit.     Allergies as of 12/31/2016 - Review Complete 12/31/2016  Allergen Reaction Noted  . No known allergies  10/04/2016   Past Medical History:  Diagnosis Date  . Asthma   . CAD (coronary artery disease)    Status post CABG July 2018  . Cigarette smoker   . COPD (chronic obstructive pulmonary disease) ([place])   . GERD (gastroesophageal reflux disease)    Past Surgical History:  Procedure Laterality Date  . ABDOMINAL HERNIA REPAIR    . BOWEL RESECTION     Bowel perf secondary to trauma  . CARDIAC CATHETERIZATION    . CORONARY ARTERY BYPASS GRAFT N/A 10/05/2016   Procedure: CORONARY ARTERY BYPASS GRAFTING (CABG) x4 with FREE MAMMARY.  ENDOSCOPIC HARVESTING OF RIGHT SAPHENOUS VEIN. (FREE LIMA to LAD, SVG to OM, SVG SEQUENTIALLY to ACUTE MARGINAL and PDA);  Surgeon: Melrose Nakayama, MD;  Location: [place];  Service: Open Heart Surgery;  Laterality: N/A;  . Platter   Archie Endo 08/08/2010  . FOOT FRACTURE SURGERY Left ~ 1965  . FRACTURE SURGERY    . HERNIA REPAIR    . INCISIONAL HERNIA REPAIR  05/2005   Archie Endo 08/08/2010  . INGUINAL HERNIA REPAIR Bilateral   . INGUINAL HERNIA REPAIR Right 09/2006   recurrent/notes 07/27/2010  . ORIF FOOT FRACTURE Left 1990   Archie Endo 08/08/2010  . RIGHT/LEFT HEART CATH AND CORONARY ANGIOGRAPHY N/A 10/04/2016   Procedure: Right/Left Heart Cath and Coronary Angiography;  Surgeon: Jettie Booze, MD;  Location: [place] [facility];  Service: Cardiovascular;  Laterality: N/A;  . TEE WITHOUT CARDIOVERSION N/A 10/05/2016   Procedure: TRANSESOPHAGEAL  ECHOCARDIOGRAM (TEE);  Surgeon: Melrose Nakayama, MD;  Location: [place];  Service: Open Heart Surgery;  Laterality: N/A;  . VENTRAL HERNIA REPAIR  09/2006   recurrent/notes 07/27/2010    ROS:  General: Negative for fever, chills, fatigue, weakness.See history of present illness ENT: Negative for hoarseness, difficulty swallowing , nasal congestion. CV: Negative for chest pain, angina, palpitations, dyspnea on exertion, peripheral edema.  Respiratory: Negative for dyspnea at rest, dyspnea on exertion,  sputum, wheezing. Positive call GI: See history of present illness. GU:  Negative for dysuria, hematuria. urinary retention requiring intermittent in and out cath at this point. Endo: See history of present illness    Physical Examination:   BP 98/62   Pulse 76   Temp 97.7 F (36.5 C) (Oral)   Ht 5' 8" (1.727 m)  Wt 121 lb 12.8 oz (55.2 kg)   BMI 18.52 kg/m   General: Thin Caucasian male in no acute distress.  Eyes: No icterus. Mouth: Oropharyngeal mucosa moist and pink , no lesions erythema or exudate. Lungs: Clear to auscultation bilaterally.  Heart: Regular rate and rhythm, no murmurs rubs or gallops.  Abdomen: Bowel sounds are normal, nontender, nondistended, no hepatosplenomegaly or masses, no abdominal bruits or hernia , no rebound or guarding.  Patient reported epigastric pain after lying down on the table for less than 1 minute. Extremities: No lower extremity edema. No clubbing or deformities. Neuro: Alert and oriented x 4   Skin: Warm and dry, no jaundice.   Psych: Alert and cooperative, normal mood and affect.  Labs:  Lab Results  Component Value Date   CREATININE 0.87 12/13/2016   BUN 20 12/13/2016   NA 131 (L) 12/13/2016   K 4.8 12/13/2016   CL 95 (L) 12/13/2016   CO2 29 12/13/2016   Lab Results  Component Value Date   WBC 14.4 (H) 10/18/2016   HGB 10.7 (L) 10/18/2016   HCT 33.6 (L) 10/18/2016   MCV 90.8 10/18/2016   PLT 778 (H) 10/18/2016   Lab  Results  Component Value Date   ALT 15 (L) 10/08/2016   AST 22 10/08/2016   ALKPHOS 42 10/08/2016   BILITOT 0.2 (L) 10/08/2016   Lab Results  Component Value Date   TSH 3.666 10/18/2016    Imaging Studies: No results found.

## 2016-12-31 NOTE — Patient Instructions (Signed)
1. Have your lab work done today. 2. Start amitiza for constipation. Take one capsule with food at breakfast. You can take TWICE a day if needed to have regular soft bowel movements. Samples provided. And prescription sent to pharmacy.

## 2016-12-31 NOTE — Assessment & Plan Note (Signed)
71 y/o male with CAD s/p CABG X 3 09/2016, COPD on oxygen presenting for heartburn, consideration of colonoscopy and EGD back in 10/2016. Since initial referral required CABG and multiple hospitalizations. Diagnosed with oropharyngeal dysphagia with risk of aspiration undergoing speech therapy rehabilitation. Concern for lack of UES relaxation and ST recommending EGD.   Due to severe deconditioning, patient declined invasive testing appropriately. We started him on bowel regimen for significant constipation. Unfortunately he developed griping with Linzess and has been taking only every few days and continues to have hard stools. Patient presents today complaining of epigastric pain when he lays down. Has been having this for a few months but didn't really reported at his last office visit. Goes away when he sits up. Really denies postprandial epigastric pain. Seems to be different than griping he has from Linzess. Really denies significant heartburn at this point on PPI. Has upcoming prostate procedure for urinary retention. Patient concerned about potential endoscopies as he has been told he can't be put to sleep right now.   Discussed at length with patient and his wife. He reports melena although does not have significant anemia clinically today. We will check labs. Also check labs for epigastric pain. Change bowel regimen to Amitiza 24 g one to 2 times daily. Hold off on EGD/TCS for now. May require CT imaging for abd pain in near future.

## 2017-01-01 ENCOUNTER — Telehealth: Payer: Self-pay | Admitting: Cardiology

## 2017-01-01 DIAGNOSIS — R69 Illness, unspecified: Secondary | ICD-10-CM | POA: Diagnosis not present

## 2017-01-01 LAB — CBC WITH DIFFERENTIAL/PLATELET
BASOS: 1 %
Basophils Absolute: 0.1 10*3/uL (ref 0.0–0.2)
EOS (ABSOLUTE): 0.1 10*3/uL (ref 0.0–0.4)
EOS: 1 %
HEMATOCRIT: 35.6 % — AB (ref 37.5–51.0)
Hemoglobin: 11.2 g/dL — ABNORMAL LOW (ref 13.0–17.7)
IMMATURE GRANS (ABS): 0.2 10*3/uL — AB (ref 0.0–0.1)
IMMATURE GRANULOCYTES: 2 %
LYMPHS: 28 %
Lymphocytes Absolute: 2.8 10*3/uL (ref 0.7–3.1)
MCH: 29.9 pg (ref 26.6–33.0)
MCHC: 31.5 g/dL (ref 31.5–35.7)
MCV: 95 fL (ref 79–97)
Monocytes Absolute: 1.1 10*3/uL — ABNORMAL HIGH (ref 0.1–0.9)
Monocytes: 11 %
NEUTROS PCT: 57 %
Neutrophils Absolute: 5.8 10*3/uL (ref 1.4–7.0)
Platelets: 551 10*3/uL — ABNORMAL HIGH (ref 150–379)
RBC: 3.75 x10E6/uL — ABNORMAL LOW (ref 4.14–5.80)
RDW: 16.5 % — ABNORMAL HIGH (ref 12.3–15.4)
WBC: 9.9 10*3/uL (ref 3.4–10.8)

## 2017-01-01 LAB — LIPASE: Lipase: 32 U/L (ref 13–78)

## 2017-01-01 LAB — HEPATIC FUNCTION PANEL
ALBUMIN: 4 g/dL (ref 3.5–4.8)
ALT: 18 IU/L (ref 0–44)
AST: 14 IU/L (ref 0–40)
Alkaline Phosphatase: 106 IU/L (ref 39–117)
Bilirubin, Direct: 0.07 mg/dL (ref 0.00–0.40)
Total Protein: 6.5 g/dL (ref 6.0–8.5)

## 2017-01-01 NOTE — Telephone Encounter (Signed)
12/21/16-- 115 12/22/16-- 116.6 12/23/16-- 118 12/24/16-- 117.8 12/25/16-- 120 12/26/16-- 117 12/27/16--116 12/28/16--119.3 12/29/16--118 12/30/16--118 12/31/16--119 01/01/17--119  BP at Dr. Melvyn Neth office yesterday was 98/62

## 2017-01-01 NOTE — Progress Notes (Signed)
cc'ed to pcp °

## 2017-01-01 NOTE — Telephone Encounter (Signed)
I will forward to Dr.Koneswaran who is covering Dr.Branch for disposition.

## 2017-01-02 ENCOUNTER — Ambulatory Visit (INDEPENDENT_AMBULATORY_CARE_PROVIDER_SITE_OTHER): Payer: Medicare HMO | Admitting: Urology

## 2017-01-02 DIAGNOSIS — R338 Other retention of urine: Secondary | ICD-10-CM | POA: Diagnosis not present

## 2017-01-02 NOTE — Telephone Encounter (Signed)
Continue to monitor for symptoms and record daily weights. Continue current diuretic regimen for now. Follow up with Dr. Wyline Mood as scheduled.

## 2017-01-02 NOTE — Telephone Encounter (Signed)
Called pt, no answer. Left message for pt to return my call.  

## 2017-01-02 NOTE — Telephone Encounter (Signed)
Spoke with pt. Advised him to monitor his symptoms and record daily weights. He voiced understanding.

## 2017-01-02 NOTE — Telephone Encounter (Signed)
It appears weights are gradually increasing? Has he noticed increasing shortness of breath and/or leg swelling? Is he still taking Lasix 60 mg q am and 40 mg q pm?

## 2017-01-02 NOTE — Telephone Encounter (Signed)
Pt's wife returned call. She states that he is not having any SOB or leg swelling. He is still currently taking his lasix @ 60 mg in the AM and 40 mg in there PM.

## 2017-01-04 ENCOUNTER — Encounter (HOSPITAL_BASED_OUTPATIENT_CLINIC_OR_DEPARTMENT_OTHER): Payer: Self-pay | Admitting: *Deleted

## 2017-01-04 NOTE — Progress Notes (Signed)
REVIEWED CHART FOR PRE-OP INTERVIEW AND NOTED PT EF 35-40% PER TEE AND PER ECHO EF 25-30% 06/ 2018.  ALSO NOTED PT ON HOME O2 LAST CARDIOLOGY NOTE 09/ 2018.  CALLED AND SPOKE W/ PT VIA PHONE , PT STATED HE WEARS O2 AT ALL TIMES. ADVISED PT / WIFE THAT PT WILL BE MOVED TO MAIN OR AND THEY WILL GET A CALL FROM DR Ronne Binning OR SCHEDULER ABOUT DATE AND TIME CHANGE.  CALLED AND LM FOR SELITA, OR SCHEDULER, THAT DUE TO ANESTHESIA GUIDELINES PT EF BELOW 40% AND HOME 02 PT NOT CANDIDATE FOR AMBULATORY SURGERY CENTER.

## 2017-01-07 ENCOUNTER — Encounter (HOSPITAL_COMMUNITY): Payer: Self-pay

## 2017-01-07 NOTE — Patient Instructions (Addendum)
TYMOTHY CASS  01/07/2017   Your procedure is scheduled on: .01/11/17  Report to [facility] Main  Entrance Take [place]  elevators to 3rd floor to  Short Stay Center at      1030AM.    Call this number if you have problems the morning of surgery [phone]    Remember: ONLY 1 PERSON MAY GO WITH YOU TO SHORT STAY TO GET  READY MORNING OF YOUR SURGERY.  Do not eat food or drink liquids :After Midnight.     Take these medicines the morning of surgery with A SIP OF WATER:  Inhalers and bring, Tamulosin, omeprazole, metoprolol, nebulizer, finasteride                                You may not have any metal on your body including hair pins and              piercings  Do not wear jewelry, , lotions, powders or perfumes, deodorant                    Men may shave face and neck.   Do not bring valuables to the hospital. [facility] IS NOT             RESPONSIBLE   FOR VALUABLES.  Contacts, dentures or bridgework may not be worn into surgery.       Patients discharged the day of surgery will not be allowed to drive home.  Name and phone number of your driver:  Special Instructions: N/A              Please read over the following fact sheets you were given: _____________________________________________________________________          [facility] - Preparing for Surgery Before surgery, you can play an important role.  Because skin is not sterile, your skin needs to be as free of germs as possible.  You can reduce the number of germs on your skin by washing with CHG (chlorahexidine gluconate) soap before surgery.  CHG is an antiseptic cleaner which kills germs and bonds with the skin to continue killing germs even after washing. Please DO NOT use if you have an allergy to CHG or antibacterial soaps.  If your skin becomes reddened/irritated stop using the CHG and inform your nurse when you arrive at Short Stay. Do not shave (including legs and underarms) for at  least 48 hours prior to the first CHG shower.  You may shave your face/neck. Please follow these instructions carefully:  1.  Shower with CHG Soap the night before surgery and the  morning of Surgery.  2.  If you choose to wash your hair, wash your hair first as usual with your  normal  shampoo.  3.  After you shampoo, rinse your hair and body thoroughly to remove the  shampoo.                           4.  Use CHG as you would any other liquid soap.  You can apply chg directly  to the skin and wash                       Gently with a scrungie or clean washcloth.  5.  Apply  the CHG Soap to your body ONLY FROM THE NECK DOWN.   Do not use on face/ open                           Wound or open sores. Avoid contact with eyes, ears mouth and genitals (private parts).                       Wash face,  Genitals (private parts) with your normal soap.             6.  Wash thoroughly, paying special attention to the area where your surgery  will be performed.  7.  Thoroughly rinse your body with warm water from the neck down.  8.  DO NOT shower/wash with your normal soap after using and rinsing off  the CHG Soap.                9.  Pat yourself dry with a clean towel.            10.  Wear clean pajamas.            11.  Place clean sheets on your bed the night of your first shower and do not  sleep with pets. Day of Surgery : Do not apply any lotions/deodorants the morning of surgery.  Please wear clean clothes to the hospital/surgery center.  FAILURE TO FOLLOW THESE INSTRUCTIONS MAY RESULT IN THE CANCELLATION OF YOUR SURGERY PATIENT SIGNATURE_________________________________  NURSE SIGNATURE__________________________________  ________________________________________________________________________

## 2017-01-07 NOTE — Progress Notes (Signed)
LOV cards Dr. Marcina Millard 11/29/16 epic Echo 10/05/16 EF 25-30 % epic ekg 11/29/16 LBBB same as 8/18 Cbc 12/31/16/ epic BMP 12/13/16 epic PFT 10/04/16 epic  cath 10/04/16 epic cxr 11/13/16 epic

## 2017-01-08 ENCOUNTER — Encounter (HOSPITAL_COMMUNITY)
Admission: RE | Admit: 2017-01-08 | Discharge: 2017-01-08 | Disposition: A | Discharge status: Home / Self Care | Payer: Medicare HMO | Source: Ambulatory Visit | Attending: Urology | Admitting: Urology

## 2017-01-08 ENCOUNTER — Encounter (HOSPITAL_COMMUNITY): Payer: Self-pay

## 2017-01-08 DIAGNOSIS — Z87891 Personal history of nicotine dependence: Secondary | ICD-10-CM | POA: Diagnosis not present

## 2017-01-08 DIAGNOSIS — K219 Gastro-esophageal reflux disease without esophagitis: Secondary | ICD-10-CM | POA: Diagnosis not present

## 2017-01-08 DIAGNOSIS — E785 Hyperlipidemia, unspecified: Secondary | ICD-10-CM | POA: Diagnosis not present

## 2017-01-08 DIAGNOSIS — I110 Hypertensive heart disease with heart failure: Secondary | ICD-10-CM | POA: Diagnosis not present

## 2017-01-08 DIAGNOSIS — Z951 Presence of aortocoronary bypass graft: Secondary | ICD-10-CM | POA: Diagnosis not present

## 2017-01-08 DIAGNOSIS — R3914 Feeling of incomplete bladder emptying: Secondary | ICD-10-CM | POA: Diagnosis not present

## 2017-01-08 DIAGNOSIS — I5022 Chronic systolic (congestive) heart failure: Secondary | ICD-10-CM | POA: Diagnosis not present

## 2017-01-08 DIAGNOSIS — Z79899 Other long term (current) drug therapy: Secondary | ICD-10-CM | POA: Diagnosis not present

## 2017-01-08 DIAGNOSIS — N401 Enlarged prostate with lower urinary tract symptoms: Secondary | ICD-10-CM | POA: Diagnosis present

## 2017-01-08 DIAGNOSIS — J449 Chronic obstructive pulmonary disease, unspecified: Secondary | ICD-10-CM | POA: Diagnosis not present

## 2017-01-08 DIAGNOSIS — I2510 Atherosclerotic heart disease of native coronary artery without angina pectoris: Secondary | ICD-10-CM | POA: Diagnosis not present

## 2017-01-08 HISTORY — DX: Pneumonia, unspecified organism: J18.9

## 2017-01-08 HISTORY — DX: Cardiac arrhythmia, unspecified: I49.9

## 2017-01-08 HISTORY — DX: Dysphasia: R47.02

## 2017-01-08 NOTE — Progress Notes (Signed)
Dr. Miguel Rota Anesthesia to see pt. At preop to eval airway and speak with pt.

## 2017-01-10 DIAGNOSIS — Z681 Body mass index (BMI) 19 or less, adult: Secondary | ICD-10-CM | POA: Diagnosis not present

## 2017-01-10 DIAGNOSIS — Z0001 Encounter for general adult medical examination with abnormal findings: Secondary | ICD-10-CM | POA: Diagnosis not present

## 2017-01-11 ENCOUNTER — Ambulatory Visit (HOSPITAL_COMMUNITY): Payer: Medicare HMO | Admitting: Anesthesiology

## 2017-01-11 ENCOUNTER — Encounter (HOSPITAL_COMMUNITY): Payer: Self-pay | Admitting: Emergency Medicine

## 2017-01-11 ENCOUNTER — Ambulatory Visit (HOSPITAL_COMMUNITY)
Admission: RE | Admit: 2017-01-11 | Discharge: 2017-01-11 | Disposition: A | Discharge status: Home / Self Care | Payer: Medicare HMO | Source: Ambulatory Visit | Attending: Urology | Admitting: Urology

## 2017-01-11 ENCOUNTER — Encounter (HOSPITAL_COMMUNITY)
Admission: RE | Disposition: A | Discharge status: Home / Self Care | Payer: Self-pay | Source: Ambulatory Visit | Attending: Urology

## 2017-01-11 DIAGNOSIS — N400 Enlarged prostate without lower urinary tract symptoms: Secondary | ICD-10-CM | POA: Diagnosis not present

## 2017-01-11 DIAGNOSIS — Z951 Presence of aortocoronary bypass graft: Secondary | ICD-10-CM | POA: Diagnosis not present

## 2017-01-11 DIAGNOSIS — I480 Paroxysmal atrial fibrillation: Secondary | ICD-10-CM | POA: Diagnosis not present

## 2017-01-11 DIAGNOSIS — I110 Hypertensive heart disease with heart failure: Secondary | ICD-10-CM | POA: Insufficient documentation

## 2017-01-11 DIAGNOSIS — K219 Gastro-esophageal reflux disease without esophagitis: Secondary | ICD-10-CM | POA: Insufficient documentation

## 2017-01-11 DIAGNOSIS — I2510 Atherosclerotic heart disease of native coronary artery without angina pectoris: Secondary | ICD-10-CM | POA: Diagnosis not present

## 2017-01-11 DIAGNOSIS — J449 Chronic obstructive pulmonary disease, unspecified: Secondary | ICD-10-CM | POA: Diagnosis not present

## 2017-01-11 DIAGNOSIS — R3914 Feeling of incomplete bladder emptying: Secondary | ICD-10-CM | POA: Insufficient documentation

## 2017-01-11 DIAGNOSIS — Z79899 Other long term (current) drug therapy: Secondary | ICD-10-CM | POA: Insufficient documentation

## 2017-01-11 DIAGNOSIS — N401 Enlarged prostate with lower urinary tract symptoms: Secondary | ICD-10-CM | POA: Diagnosis not present

## 2017-01-11 DIAGNOSIS — I25810 Atherosclerosis of coronary artery bypass graft(s) without angina pectoris: Secondary | ICD-10-CM | POA: Diagnosis not present

## 2017-01-11 DIAGNOSIS — I5022 Chronic systolic (congestive) heart failure: Secondary | ICD-10-CM | POA: Insufficient documentation

## 2017-01-11 DIAGNOSIS — E785 Hyperlipidemia, unspecified: Secondary | ICD-10-CM | POA: Insufficient documentation

## 2017-01-11 DIAGNOSIS — Z87891 Personal history of nicotine dependence: Secondary | ICD-10-CM | POA: Insufficient documentation

## 2017-01-11 HISTORY — DX: Chronic systolic (congestive) heart failure: I50.22

## 2017-01-11 HISTORY — DX: Chronic obstructive pulmonary disease, unspecified: J44.9

## 2017-01-11 HISTORY — DX: Dependence on supplemental oxygen: Z99.81

## 2017-01-11 HISTORY — PX: CYSTOSCOPY WITH INSERTION OF UROLIFT: SHX6678

## 2017-01-11 SURGERY — CYSTOSCOPY WITH INSERTION OF UROLIFT
Anesthesia: General

## 2017-01-11 MED ORDER — PHENYLEPHRINE 40 MCG/ML (10ML) SYRINGE FOR IV PUSH (FOR BLOOD PRESSURE SUPPORT)
PREFILLED_SYRINGE | INTRAVENOUS | Status: AC
  Filled 2017-01-11: qty 10

## 2017-01-11 MED ORDER — FENTANYL CITRATE (PF) 100 MCG/2ML IJ SOLN
INTRAMUSCULAR | Status: DC | PRN
  Administered 2017-01-11: 50 ug via INTRAVENOUS

## 2017-01-11 MED ORDER — MEPERIDINE HCL 50 MG/ML IJ SOLN: 6.2500 mg | INTRAMUSCULAR | Status: DC | PRN

## 2017-01-11 MED ORDER — FENTANYL CITRATE (PF) 100 MCG/2ML IJ SOLN
INTRAMUSCULAR | Status: AC
  Filled 2017-01-11: qty 2

## 2017-01-11 MED ORDER — LIDOCAINE 2% (20 MG/ML) 5 ML SYRINGE
INTRAMUSCULAR | Status: AC
  Filled 2017-01-11: qty 5

## 2017-01-11 MED ORDER — CEFAZOLIN SODIUM-DEXTROSE 2-4 GM/100ML-% IV SOLN
2.0000 g | INTRAVENOUS | Status: AC
  Administered 2017-01-11: 2 g via INTRAVENOUS
  Filled 2017-01-11: qty 100

## 2017-01-11 MED ORDER — STERILE WATER FOR IRRIGATION IR SOLN
Status: DC | PRN
  Administered 2017-01-11: 3000 mL

## 2017-01-11 MED ORDER — PHENYLEPHRINE 40 MCG/ML (10ML) SYRINGE FOR IV PUSH (FOR BLOOD PRESSURE SUPPORT)
PREFILLED_SYRINGE | INTRAVENOUS | Status: DC | PRN
  Administered 2017-01-11: 80 ug via INTRAVENOUS

## 2017-01-11 MED ORDER — PROPOFOL 10 MG/ML IV BOLUS
INTRAVENOUS | Status: DC | PRN
  Administered 2017-01-11: 100 mg via INTRAVENOUS

## 2017-01-11 MED ORDER — LIDOCAINE 2% (20 MG/ML) 5 ML SYRINGE
INTRAMUSCULAR | Status: DC | PRN
  Administered 2017-01-11: 50 mg via INTRAVENOUS

## 2017-01-11 MED ORDER — LACTATED RINGERS IV SOLN
INTRAVENOUS | Status: DC
  Administered 2017-01-11 (×2): via INTRAVENOUS

## 2017-01-11 MED ORDER — FENTANYL CITRATE (PF) 100 MCG/2ML IJ SOLN
25.0000 ug | INTRAMUSCULAR | Status: DC | PRN
  Administered 2017-01-11 (×3): 50 ug via INTRAVENOUS

## 2017-01-11 MED ORDER — PROMETHAZINE HCL 25 MG/ML IJ SOLN: 6.2500 mg | INTRAMUSCULAR | Status: DC | PRN

## 2017-01-11 MED ORDER — HYDROCODONE-ACETAMINOPHEN 7.5-325 MG PO TABS: 1.0000 | ORAL_TABLET | Freq: Once | ORAL | Status: DC | PRN

## 2017-01-11 MED ORDER — TRAMADOL HCL 50 MG PO TABS: 50.0000 mg | ORAL_TABLET | Freq: Four times a day (QID) | ORAL | 0 refills | Status: DC | PRN

## 2017-01-11 MED ORDER — PROPOFOL 10 MG/ML IV BOLUS
INTRAVENOUS | Status: AC
  Filled 2017-01-11: qty 20

## 2017-01-11 MED ORDER — EPHEDRINE 5 MG/ML INJ
INTRAVENOUS | Status: AC
  Filled 2017-01-11: qty 10

## 2017-01-11 SURGICAL SUPPLY — 12 items
BAG URINE DRAINAGE (UROLOGICAL SUPPLIES) ×4 IMPLANT
BAG URO CATCHER STRL LF (MISCELLANEOUS) ×2 IMPLANT
CATH FOLEY 2WAY SLVR  5CC 16FR (CATHETERS) ×2
CATH FOLEY 2WAY SLVR 5CC 16FR (CATHETERS) ×2 IMPLANT
COVER FOOTSWITCH UNIV (MISCELLANEOUS) IMPLANT
COVER SURGICAL LIGHT HANDLE (MISCELLANEOUS) ×2 IMPLANT
GLOVE BIO SURGEON STRL SZ8 (GLOVE) ×2 IMPLANT
GOWN STRL REUS W/TWL XL LVL3 (GOWN DISPOSABLE) ×4 IMPLANT
MANIFOLD NEPTUNE II (INSTRUMENTS) ×2 IMPLANT
PACK CYSTO (CUSTOM PROCEDURE TRAY) ×2 IMPLANT
SYSTEM UROLIFT (Male Continence) ×12 IMPLANT
TUBING CONNECTING 10 (TUBING) ×2 IMPLANT

## 2017-01-11 NOTE — Anesthesia Preprocedure Evaluation (Addendum)
Anesthesia Evaluation  Patient identified by MRN, date of birth, ID band Patient awake    Reviewed: Allergy & Precautions, NPO status , Patient's Chart, lab work & pertinent test results, reviewed documented beta blocker date and time   Airway Mallampati: I  TM Distance: >3 FB Neck ROM: Full    Dental   Pulmonary asthma , pneumonia, resolved, COPD,  COPD inhaler, former smoker,    breath sounds clear to auscultation + decreased breath sounds      Cardiovascular hypertension, Pt. on medications and Pt. on home beta blockers + CAD, + CABG and +CHF  Normal cardiovascular exam+ dysrhythmias  Rhythm:Regular Rate:Normal  LBBB LVEF 25-30% Ischemic CM   Neuro/Psych negative neurological ROS  negative psych ROS   GI/Hepatic Neg liver ROS, GERD  Medicated and Controlled,  Endo/Other  Hyperlipidemia  Renal/GU negative Renal ROS   BPH    Musculoskeletal negative musculoskeletal ROS (+)   Abdominal   Peds  Hematology negative hematology ROS (+)   Anesthesia Other Findings   Reproductive/Obstetrics                            Anesthesia Physical Anesthesia Plan  ASA: III  Anesthesia Plan: General   Post-op Pain Management:    Induction: Intravenous  PONV Risk Score and Plan: 4 or greater and Ondansetron, Dexamethasone, Midazolam, Propofol infusion, Treatment may vary due to age or medical condition and Promethazine  Airway Management Planned: LMA  Additional Equipment:   Intra-op Plan:   Post-operative Plan: Extubation in OR  Informed Consent: I have reviewed the patients History and Physical, chart, labs and discussed the procedure including the risks, benefits and alternatives for the proposed anesthesia with the patient or authorized representative who has indicated his/her understanding and acceptance.   Dental advisory given  Plan Discussed with: CRNA, Anesthesiologist and  Surgeon  Anesthesia Plan Comments:         Anesthesia Quick Evaluation

## 2017-01-11 NOTE — H&P (Signed)
Urology Admission H&P  Chief Complaint: urinary retnetion  History of Present Illness: Devin Barton is a 71yo with BPH and urinary retention who has failed medical therapy  Past Medical History:  Diagnosis Date  . Asthma   . CAD (coronary artery disease) cardiologist-- dr j. 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  . Chronic systolic CHF (congestive heart failure) ([facility])    ef 35-40% per TEE 7/ 2018, echo ef 25-30% 06/ 2018  . Cigarette smoker   . Dysphasia    trouble swallowing after  open heart  . Dysrhythmia    post op a fib  . GERD (gastroesophageal reflux disease)   . On home oxygen therapy   . Pneumonia   . Stage 3 severe COPD by GOLD classification [facility])    previously montiored by pulmologist -- dr wert (last visit 2009) currently folllowed by pcp   Past Surgical History:  Procedure Laterality Date  . ABDOMINAL HERNIA REPAIR    . BOWEL RESECTION     Bowel perf secondary to trauma  . CARDIAC CATHETERIZATION    . CORONARY ARTERY BYPASS GRAFT N/A 10/05/2016   Procedure: CORONARY ARTERY BYPASS GRAFTING (CABG) x4 with FREE MAMMARY.  ENDOSCOPIC HARVESTING OF RIGHT SAPHENOUS VEIN. (FREE LIMA to LAD, SVG to OM, SVG SEQUENTIALLY to ACUTE MARGINAL and PDA);  Surgeon: Loreli Slot, MD;  Location: [facility] OR;  Service: Open Heart Surgery;  Laterality: N/A;  . EXPLORATORY LAPAROTOMY  1986   [name] 08/08/2010  . FOOT FRACTURE SURGERY Left ~ 1965  . FRACTURE SURGERY    . HERNIA REPAIR    . INCISIONAL HERNIA REPAIR  05/2005   [name] 08/08/2010  . INGUINAL HERNIA REPAIR Bilateral   . INGUINAL HERNIA REPAIR Right 09/2006   recurrent/notes 07/27/2010  . ORIF FOOT FRACTURE Left 1990   [name] 08/08/2010  . RIGHT/LEFT HEART CATH AND CORONARY ANGIOGRAPHY N/A 10/04/2016   Procedure: Right/Left Heart Cath and Coronary Angiography;  Surgeon: Corky Crafts, MD;  Location: [facility] INVASIVE CV LAB;  Service: Cardiovascular;  Laterality: N/A;  . TEE WITHOUT CARDIOVERSION N/A 10/05/2016   Procedure:  TRANSESOPHAGEAL ECHOCARDIOGRAM (TEE);  Surgeon: Loreli Slot, MD;  Location: [facility] OR;  Service: Open Heart Surgery;  Laterality: N/A;  . VENTRAL HERNIA REPAIR  09/2006   recurrent/notes 07/27/2010    Home Medications:  Current Facility-Administered Medications  Medication Dose Route Frequency Provider Last Rate Last Dose  . ceFAZolin (ANCEF) IVPB 2g/100 mL premix  2 g Intravenous 30 min Pre-Op Herny Scurlock, Mardene Celeste, MD      . lactated ringers infusion   Intravenous Continuous Mal Amabile, MD 50 mL/hr at 01/11/17 1044     Allergies: No Known Allergies  Family History  Problem Relation Age of Onset  . Heart disease Mother        No details  . Alzheimer's disease Mother   . Atopy Neg Hx   . Colon cancer Neg Hx    Social History:  reports that he has quit smoking. His smoking use included Cigarettes. He has a 55.00 pack-year smoking history. He has never used smokeless tobacco. He reports that he does not drink alcohol or use drugs.  Review of Systems  Genitourinary: Positive for frequency and urgency.  All other systems reviewed and are negative.   Physical Exam:  Vital signs in last 24 hours: Temp:  [97.9 F (36.6 C)] 97.9 F (36.6 C) (10/19 0959) Pulse Rate:  [65] 65 (10/19 0959) Resp:  [18] 18 (10/19 0959) BP: (  124)/(61) 124/61 (10/19 0959) SpO2:  [100 %] 100 % (10/19 0959) Weight:  [54.9 kg (121 lb)] 54.9 kg (121 lb) (10/19 1042) Physical Exam  Constitutional: He is oriented to person, place, and time. He appears well-developed and well-nourished.  HENT:  Head: Normocephalic and atraumatic.  Eyes: Pupils are equal, round, and reactive to light. EOM are normal.  Neck: Normal range of motion. No thyromegaly present.  Cardiovascular: Normal rate and regular rhythm.   Respiratory: Effort normal. No respiratory distress.  GI: Soft. He exhibits no distension.  Musculoskeletal: Normal range of motion. He exhibits no edema.  Neurological: He is alert and oriented to  person, place, and time.  Skin: Skin is warm and dry.  Psychiatric: He has a normal mood and affect. His behavior is normal. Judgment and thought content normal.    Laboratory Data:  No results found for this or any previous visit (from the past 24 hour(s)). No results found for this or any previous visit (from the past 240 hour(s)). Creatinine: No results for input(s): CREATININE in the last 168 hours. Baseline Creatinine: unknwon  Impression/Assessment:  71yo with BPH and urinary retention  Plan:  The risks/benefits/alternaitves to Urolift implantation was explained to the patient and he understands and wishes to proceed with surgery  Devin Barton 01/11/2017, 12:41 PM

## 2017-01-11 NOTE — Anesthesia Procedure Notes (Signed)
Procedure Name: LMA Insertion Date/Time: 01/11/2017 1:13 PM Performed by: [place], Embree Brawley C Pre-anesthesia Checklist: Patient identified, Emergency Drugs available, Suction available and Patient being monitored Patient Re-evaluated:Patient Re-evaluated prior to induction Oxygen Delivery Method: Circle system utilized Preoxygenation: Pre-oxygenation with 100% oxygen Induction Type: IV induction Ventilation: Mask ventilation without difficulty LMA: LMA inserted LMA Size: 4.0 Number of attempts: 1 Airway Equipment and Method: Bite block Placement Confirmation: positive ETCO2 Tube secured with: Tape Dental Injury: Teeth and Oropharynx as per pre-operative assessment

## 2017-01-11 NOTE — Anesthesia Postprocedure Evaluation (Signed)
Anesthesia Post Note  Patient: Devin Barton  Procedure(s) Performed: CYSTOSCOPY WITH INSERTION OF UROLIFT (N/A )     Patient location during evaluation: PACU Anesthesia Type: General Level of consciousness: awake and alert and oriented Pain management: pain level controlled Vital Signs Assessment: post-procedure vital signs reviewed and stable Respiratory status: spontaneous breathing, nonlabored ventilation and respiratory function stable Cardiovascular status: blood pressure returned to baseline and stable Postop Assessment: no apparent nausea or vomiting Anesthetic complications: no    Last Vitals:  Vitals:   01/11/17 1445 01/11/17 1458  BP: (!) 147/74 (!) 141/66  Pulse: 90 90  Resp: 15 14  Temp: 36.7 C 36.7 C  SpO2: 100% 100%    Last Pain:  Vitals:   01/11/17 1458  TempSrc:   PainSc: 0-No pain                 Ala Kratz A.

## 2017-01-11 NOTE — Discharge Instructions (Signed)
General Anesthesia, Adult, Care After °These instructions provide you with information about caring for yourself after your procedure. Your health care provider may also give you more specific instructions. Your treatment has been planned according to current medical practices, but problems sometimes occur. Call your health care provider if you have any problems or questions after your procedure. °What can I expect after the procedure? °After the procedure, it is common to have: °· Vomiting. °· A sore throat. °· Mental slowness. ° °It is common to feel: °· Nauseous. °· Cold or shivery. °· Sleepy. °· Tired. °· Sore or achy, even in parts of your body where you did not have surgery. ° °Follow these instructions at home: °For at least 24 hours after the procedure: °· Do not: °? Participate in activities where you could fall or become injured. °? Drive. °? Use heavy machinery. °? Drink alcohol. °? Take sleeping pills or medicines that cause drowsiness. °? Make important decisions or sign legal documents. °? Take care of children on your own. °· Rest. °Eating and drinking °· If you vomit, drink water, juice, or soup when you can drink without vomiting. °· Drink enough fluid to keep your urine clear or pale yellow. °· Make sure you have little or no nausea before eating solid foods. °· Follow the diet recommended by your health care provider. °General instructions °· Have a responsible adult stay with you until you are awake and alert. °· Return to your normal activities as told by your health care provider. Ask your health care provider what activities are safe for you. °· Take over-the-counter and prescription medicines only as told by your health care provider. °· If you smoke, do not smoke without supervision. °· Keep all follow-up visits as told by your health care provider. This is important. °Contact a health care provider if: °· You continue to have nausea or vomiting at home, and medicines are not helpful. °· You  cannot drink fluids or start eating again. °· You cannot urinate after 8-12 hours. °· You develop a skin rash. °· You have fever. °· You have increasing redness at the site of your procedure. °Get help right away if: °· You have difficulty breathing. °· You have chest pain. °· You have unexpected bleeding. °· You feel that you are having a life-threatening or urgent problem. °This information is not intended to replace advice given to you by your health care provider. Make sure you discuss any questions you have with your health care provider. °Document Released: 06/18/2000 Document Revised: 08/15/2015 Document Reviewed: 02/24/2015 °Elsevier Interactive Patient Education © 2018 [employer] ° ° ° °Indwelling Urinary Catheter Care, Adult °Take good care of your catheter to keep it working and to prevent problems. °How to wear your catheter °Attach your catheter to your leg with tape (adhesive tape) or a leg strap. Make sure it is not too tight. If you use tape, remove any bits of tape that are already on the catheter. °How to wear a drainage bag °You should have: °· A large overnight bag. °· A small leg bag. ° °Overnight Bag °You may wear the overnight bag at any time. Always keep the bag below the level of your bladder but off the floor. When you sleep, put a clean plastic bag in a wastebasket. Then hang the bag inside the wastebasket. °Leg Bag °Never wear the leg bag at night. Always wear the leg bag below your knee. Keep the leg bag secure with a leg strap or tape. °  How to care for your skin °· Clean the skin around the catheter at least once every day. °· Shower every day. Do not take baths. °· Put creams, lotions, or ointments on your genital area only as told by your doctor. °· Do not use powders, sprays, or lotions on your genital area. °How to clean your catheter and your skin °1. Wash your hands with soap and water. °2. Wet a washcloth in warm water and gentle (mild) soap. °3. Use the washcloth to clean the  skin where the catheter enters your body. Clean downward and wipe away from the catheter in small circles. Do not wipe toward the catheter. °4. Pat the area dry with a clean towel. Make sure to clean off all soap. °How to care for your drainage bags °Empty your drainage bag when it is ?-½ full or at least 2-3 times a day. Replace your drainage bag once a month or sooner if it starts to smell bad or look dirty. Do not clean your drainage bag unless told by your doctor. °Emptying a drainage bag ° °Supplies Needed °· Rubbing alcohol. °· Gauze pad or cotton ball. °· Tape or a leg strap. ° °Steps °1. Wash your hands with soap and water. °2. Separate (detach) the bag from your leg. °3. Hold the bag over the toilet or a clean container. Keep the bag below your hips and bladder. This stops pee (urine) from going back into the tube. °4. Open the pour spout at the bottom of the bag. °5. Empty the pee into the toilet or container. Do not let the pour spout touch any surface. °6. Put rubbing alcohol on a gauze pad or cotton ball. °7. Use the gauze pad or cotton ball to clean the pour spout. °8. Close the pour spout. °9. Attach the bag to your leg with tape or a leg strap. °10. Wash your hands. ° °Changing a drainage bag °Supplies Needed °· Alcohol wipes. °· A clean drainage bag. °· Adhesive tape or a leg strap. ° °Steps °1. Wash your hands with soap and water. °2. Separate the dirty bag from your leg. °3. Pinch the rubber catheter with your fingers so that pee does not spill out. °4. Separate the catheter tube from the drainage tube where these tubes connect (at the connection valve). Do not let the tubes touch any surface. °5. Clean the end of the catheter tube with an alcohol wipe. Use a different alcohol wipe to clean the end of the drainage tube. °6. Connect the catheter tube to the drainage tube of the clean bag. °7. Attach the new bag to the leg with adhesive tape or a leg strap. °8. Wash your hands. ° °How to prevent  infection and other problems °· Never pull on your catheter or try to remove it. Pulling can damage tissue in your body. °· Always wash your hands before and after touching your catheter. °· If a leg strap gets wet, replace it with a dry one. °· Drink enough fluids to keep your pee clear or pale yellow, or as told by your doctor. °· Do not let the drainage bag or tubing touch the floor. °· Wear cotton underwear. °· If you are male, wipe from front to back after you poop (have a bowel movement). °· Check on the catheter often to make sure it works and the tubing is not twisted. °Get help if: °· Your pee is cloudy. °· Your pee smells unusually bad. °· Your pee is not   draining into the bag. °· Your tube gets clogged. °· Your catheter starts to leak. °· Your bladder feels full. °Get help right away if: °· You have redness, swelling, or pain where the catheter enters your body. °· You have fluid, pus, or a bad smell coming from the area where the catheter enters your body. °· The area where the catheter enters your body feels warm. °· You have a fever. °· You have pain in your: °? Stomach (abdomen). °? Legs. °? Lower back. °? Bladder. °· You see blood fill the catheter. °· Your pee is pink or red. °· You feel sick to your stomach (nauseous). °· You throw up (vomit). °· You have chills. °· Your catheter gets pulled out. °This information is not intended to replace advice given to you by your health care provider. Make sure you discuss any questions you have with your health care provider. °Document Released: 07/07/2012 Document Revised: 02/08/2016 Document Reviewed: 08/25/2013 °Elsevier Interactive Patient Education © 2018 [employer] ° °

## 2017-01-11 NOTE — Op Note (Signed)
   PREOPERATIVE DIAGNOSIS: Benign prostatic hypertrophy with incomplete bladder emptying.  POSTOPERATIVE DIAGNOSIS: Bsame  PROCEDURE: Cystoscopy with implantation of UroLift devices, 6 implants.  SURGEON: Wilkie AyePatrick [name] Fetty, M.D.  ANESTHESIA: General  ANTIBIOTICS: ancef  SPECIMEN: None.  DRAINS: A 16-French Foley catheter.  BLOOD LOSS: Minimal.  COMPLICATIONS: None.  INDICATIONS:The Patient is an 71 year old white male with BPH and Incomplete bladder emptying. He has failed medical therapy and has elected UroLift for definitive treatment.  FINDINGS OF PROCEDURE: He was taken to the operating room where a genral anesthetic was induced. He was placed in lithotomy position and was fitted with PAS hose. His perineum and genitalia were prepped with chlorhexidine, and he was draped in usual sterile fashion.  Cystoscopy was performed using the UroLift scope and 0 degree lens. Examination revealed a normal urethra. The external sphincter was intact. Prostatic urethra was approximately 5 cm in length with lateral lobe enlargement. There was also little bit of bladder neck elevation. Inspection of bladder revealed mild-to-moderate trabeculation with no tumors, stones, or inflammation. No cellules or diverticula were noted. Ureteral orifices were in their normal anatomic position effluxing clear urine.  After initial cystoscopy, the visual obturator was replaced with the first UroLift device. This was turned to the 9 o'clock position and pulled back to the veru and then slightly advanced. Pressure was then applied to the right lateral lobe and the UroLift device was deployed.  The second UroLift device was then inserted and applied to the left lateral lobe at 3 o'clock and deployed in the mid prostatic urethra. After this, there was still some apparent obstruction closer to the bladder neck. So a second level of UroLift your left device was applied between the  mid urethra and the proximal urethra providing further patency to the prostatic urethra. At this point, there was mild bleeding but the patient did have a spinal anesthetic. So it was thought that a Foley catheter was indicated. The scope was removed and a 16-French Foley catheter was inserted without difficulty. The balloon was filled with 10 mL sterile fluid, and the catheter was placed to straight drainage.  COMPLICATIONS: None   CONDITION: Stable, extubated, transferred to PACU  PLAN: The patient will be discharged home and followup in 2 days for a voiding trial.

## 2017-01-11 NOTE — Transfer of Care (Signed)
Immediate Anesthesia Transfer of Care Note  Patient: Devin Barton  Procedure(s) Performed: CYSTOSCOPY WITH INSERTION OF UROLIFT (N/A )  Patient Location: PACU  Anesthesia Type:General  Level of Consciousness: awake, alert  and oriented  Airway & Oxygen Therapy: Patient Spontanous Breathing and Patient connected to nasal cannula oxygen  Post-op Assessment: Report given to RN and Post -op Vital signs reviewed and stable  Post vital signs: Reviewed and stable  Last Vitals:  Vitals:   01/11/17 0959  BP: 124/61  Pulse: 65  Resp: 18  Temp: 36.6 C  SpO2: 100%    Last Pain:  Vitals:   01/11/17 0959  TempSrc: Oral      Patients Stated Pain Goal: 4 (01/11/17 1042)  Complications: No apparent anesthesia complications

## 2017-01-12 DIAGNOSIS — J449 Chronic obstructive pulmonary disease, unspecified: Secondary | ICD-10-CM | POA: Diagnosis not present

## 2017-01-12 DIAGNOSIS — I5021 Acute systolic (congestive) heart failure: Secondary | ICD-10-CM | POA: Diagnosis not present

## 2017-01-14 NOTE — Progress Notes (Signed)
lfts normal. Lipase normal.  H/H improved.   If he is still having abd pain I would offer him CT A/P with contrast. Dx: weight loss, epigastric pain.

## 2017-01-15 ENCOUNTER — Ambulatory Visit (INDEPENDENT_AMBULATORY_CARE_PROVIDER_SITE_OTHER): Payer: Medicare HMO | Admitting: Urology

## 2017-01-15 DIAGNOSIS — R338 Other retention of urine: Secondary | ICD-10-CM | POA: Diagnosis not present

## 2017-01-16 DIAGNOSIS — R69 Illness, unspecified: Secondary | ICD-10-CM | POA: Diagnosis not present

## 2017-01-18 DIAGNOSIS — Z9981 Dependence on supplemental oxygen: Secondary | ICD-10-CM | POA: Diagnosis not present

## 2017-01-18 DIAGNOSIS — Z681 Body mass index (BMI) 19 or less, adult: Secondary | ICD-10-CM | POA: Diagnosis not present

## 2017-01-18 DIAGNOSIS — N342 Other urethritis: Secondary | ICD-10-CM | POA: Diagnosis not present

## 2017-01-18 DIAGNOSIS — R339 Retention of urine, unspecified: Secondary | ICD-10-CM | POA: Diagnosis not present

## 2017-01-21 DIAGNOSIS — R338 Other retention of urine: Secondary | ICD-10-CM | POA: Diagnosis not present

## 2017-01-28 DIAGNOSIS — R69 Illness, unspecified: Secondary | ICD-10-CM | POA: Diagnosis not present

## 2017-01-29 ENCOUNTER — Ambulatory Visit: Payer: Medicare HMO | Admitting: Cardiology

## 2017-01-29 ENCOUNTER — Encounter: Payer: Self-pay | Admitting: Cardiology

## 2017-01-29 VITALS — BP 108/64 | HR 70 | Ht 68.0 in | Wt 122.8 lb

## 2017-01-29 DIAGNOSIS — I5022 Chronic systolic (congestive) heart failure: Secondary | ICD-10-CM | POA: Diagnosis not present

## 2017-01-29 DIAGNOSIS — I6523 Occlusion and stenosis of bilateral carotid arteries: Secondary | ICD-10-CM | POA: Diagnosis not present

## 2017-01-29 DIAGNOSIS — I2510 Atherosclerotic heart disease of native coronary artery without angina pectoris: Secondary | ICD-10-CM

## 2017-01-29 NOTE — Patient Instructions (Signed)
Medication Instructions:  Your physician recommends that you continue on your current medications as directed. Please refer to the Current Medication list given to you today.   Labwork: none  Testing/Procedures: Your physician has requested that you have an echocardiogram. Echocardiography is a painless test that uses sound waves to create images of your heart. It provides your doctor with information about the size and shape of your heart and how well your heart's chambers and valves are working. This procedure takes approximately one hour. There are no restrictions for this procedure.    Follow-Up: Your physician recommends that you schedule a follow-up appointment in: pending echo results   Any Other Special Instructions Will Be Listed Below (If Applicable).     If you need a refill on your cardiac medications before your next appointment, please call your pharmacy.   

## 2017-01-29 NOTE — Progress Notes (Signed)
Clinical Summary Devin Barton is a 71 y.o.male seen today for follow up of the following medical problems.   1. CAD/ chronic systolic HF - admitted to [facility] in June 2018 - EKG with LBBB thought to be chronic - treated for COPD exacerbation with plans for outpatient cardiology evaluation - seen by Devin Devin Barton, referred for Devin Barton - 08/2016 Lexiscan: large area of scar as reported below, no significant ischemia. LVEF 30-44%. High risk due to low LVEF and scar - 08/2016: LVEF 25-30%, severe hypokinesis of the anteroseptal and apical myocardium. No definite LV mural thrombus but images are somewhat limited, could consider aDefinity contrast study.  - last visit referred for cath. 09/2016 cath report below, patient with severe 3 vessel CAD. Referred for CABG - CABG 10/05/16 with LIMA-LAD,SVG-OM1, SVG-acute marginal and sequential to PDA).   - admitted late July with acute on chronic systolic HF. Diuresed roughly 4 liters, discharge weight 117 lbs - stable SOB. No recent LE edmea.  - pressure like pain midchest, 3-4/10. Not positional. Lasts about 30 minutes. 3-4 times a night. Better with breathing treatments - compliant with meds. Has been on lasix 80mg  day. Home weights down to 114 lbs, down from 117lbs at discharge.  -reports Toprol changed back to lopressor. I suspect due to hemodynamics during recent Barton   - changed back to Toprol last visit.No new side effects.  - SOB is stable. Does reports cough since Sunday. Productive white and foamy. No fevers or chills.  - compliant with meds - home weights 118-122 lbs.  - we discussed ICD first time today.   2. COPD - followed by pcp - last seen by Devin Devin Barton 07/2007 - followed by Devin Devin Barton  3. Leg swelling - 10/16/16 with superficial thrombus at site of vein harvest, no DVT  - no recent edema.   4. Postop afib - occurred after CABG. Started on IV amio and then oral amio - Not committed to anticoag at this time  -  no recent palpitations since last visit   5. Urinary retention - followed by urology  6. Carotid stenosis - 09/2016 RICA 1-39%, LICA 60-79%.  - denies any neuro symptoms    Past Medical History:  Diagnosis Date  . Asthma   . CAD (coronary artery disease) cardiologist-- Devin Barton   Status post CABG July 2018  . Chronic systolic CHF (congestive heart failure) (HCC)    ef 35-40% per TEE 7/ 2018, echo ef 25-30% 06/ 2018  . Cigarette smoker   . Dysphasia    trouble swallowing after  open heart  . Dysrhythmia    post op a fib  . GERD (gastroesophageal reflux disease)   . On home oxygen therapy   . Pneumonia   . Stage 3 severe COPD by GOLD classification [facility])    previously montiored by pulmologist -- Devin Barton (last visit 2009) currently folllowed by pcp     No Known Allergies   Current Outpatient Medications  Medication Sig Dispense Refill  . acetaminophen (TYLENOL) 500 MG tablet Take 1,000 mg by mouth every 6 (six) hours as needed for moderate pain or headache.     Marland Kitchen aspirin EC 325 MG EC tablet Take 1 tablet (325 mg total) by mouth daily. 30 tablet 0  . atorvastatin (LIPITOR) 40 MG tablet Take 1 tablet (40 mg total) by mouth daily at 6 PM. 30 tablet 3  . budesonide (PULMICORT) 0.25 MG/2ML nebulizer solution Take 2 mLs (0.25 mg total) by  nebulization 2 (two) times daily. 60 mL 12  . feeding supplement, ENSURE ENLIVE, (ENSURE ENLIVE) LIQD Take 237 mLs by mouth 2 (two) times daily between meals. 237 mL 12  . finasteride (PROSCAR) 5 MG tablet Take 5 mg by mouth daily.    . furosemide (LASIX) 40 MG tablet TAKE 40 MG DAILY. mAY TAKE AN ADDITIONAL 40 MG FOR WEIGHT GAIN OR SWELLING. (Patient taking differently: Take 20-60 mg by mouth See admin instructions. Take 60 mg by mouth in the morning and take 20 mg by mouth in the afternoon) 60 tablet 3  . guaiFENesin (MUCINEX) 600 MG 12 hr tablet Take 600 mg by mouth daily as needed for cough or to loosen phlegm.     Marland Kitchen. ipratropium  (ATROVENT) 0.02 % nebulizer solution Take 0.5 mg by nebulization daily as needed for wheezing or shortness of breath.     . lisinopril (PRINIVIL,ZESTRIL) 2.5 MG tablet Take 1 tablet (2.5 mg total) by mouth daily. 90 tablet 3  . lubiprostone (AMITIZA) 24 MCG capsule Take one capsule with food ONCE TO TWICE daily for constipation. (Patient taking differently: Take 24 mcg by mouth daily with breakfast. ) 60 capsule 3  . metoprolol tartrate (LOPRESSOR) 25 MG tablet Take 12.5 mg by mouth daily.    Marland Kitchen. omeprazole (PRILOSEC) 40 MG capsule Take 40 mg by mouth daily.    . potassium chloride SA (K-DUR,KLOR-CON) 20 MEQ tablet Take 1 tablet (20 mEq total) by mouth daily. 30 tablet 3  . traMADol (ULTRAM) 50 MG tablet Take 1 tablet (50 mg total) by mouth every 6 (six) hours as needed. 30 tablet 0  . umeclidinium-vilanterol (ANORO ELLIPTA) 62.5-25 MCG/INH AEPB Inhale 1 puff into the lungs daily.     No current facility-administered medications for this visit.      Past Surgical History:  Procedure Laterality Date  . ABDOMINAL HERNIA REPAIR    . BOWEL RESECTION     Bowel perf secondary to trauma  . CARDIAC CATHETERIZATION    . EXPLORATORY LAPAROTOMY  1986   Devin Barton/notes 08/08/2010  . FOOT FRACTURE Barton Left ~ 1965  . FRACTURE Barton    . HERNIA REPAIR    . INCISIONAL HERNIA REPAIR  05/2005   Devin Barton/notes 08/08/2010  . INGUINAL HERNIA REPAIR Bilateral   . INGUINAL HERNIA REPAIR Right 09/2006   recurrent/notes 07/27/2010  . ORIF FOOT FRACTURE Left 1990   Devin Barton/notes 08/08/2010  . VENTRAL HERNIA REPAIR  09/2006   recurrent/notes 07/27/2010     No Known Allergies    Family History  Problem Relation Age of Onset  . Heart disease Mother        No details  . Alzheimer's disease Mother   . Atopy Neg Hx   . Colon cancer Neg Hx      Social History [name] reports that he has quit smoking. His smoking use included cigarettes. He has a 55.00 pack-year smoking history. he has never used smokeless tobacco. Mr.  Devin Barton reports that he does not drink alcohol.   Review of Systems CONSTITUTIONAL: No weight loss, fever, chills, weakness or fatigue.  HEENT: Eyes: No visual loss, blurred vision, double vision or yellow sclerae.No hearing loss, sneezing, congestion, runny nose or sore throat.  SKIN: No rash or itching.  CARDIOVASCULAR: per hpi RESPIRATORY: per hpi  GASTROINTESTINAL: No anorexia, nausea, vomiting or diarrhea. No abdominal pain or blood.  GENITOURINARY: No burning on urination, no polyuria NEUROLOGICAL: No headache, dizziness, syncope, paralysis, ataxia, numbness or tingling in the extremities.  No change in bowel or bladder control.  MUSCULOSKELETAL: No muscle, back pain, joint pain or stiffness.  LYMPHATICS: No enlarged nodes. No history of splenectomy.  PSYCHIATRIC: No history of depression or anxiety.  ENDOCRINOLOGIC: No reports of sweating, cold or heat intolerance. No polyuria or polydipsia.  .   PhysicalMarland Kitchen Examination Vitals:   01/29/17 1056  BP: 108/64  Pulse: 70  SpO2: 96%   Vitals:   01/29/17 1056  Weight: 122 lb 12.8 oz (55.7 kg)  Height: 5\' 8"  (1.727 m)    Gen: resting comfortably, no acute distress HEENT: no scleral icterus, pupils equal round and reactive, no palptable cervical adenopathy,  CV: RRR, no m/r/g, no jvd Resp: Clear to auscultation bilaterally GI: abdomen is soft, non-tender, non-distended, normal bowel sounds, no hepatosplenomegaly MSK: extremities are warm, no edema.  Skin: warm, no rash Neuro:  no focal deficits Psych: appropriate affect   Diagnostic Studies  08/2016 Lexiscan MPI  There was no ST segment deviation noted during stress.  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 High risk based on large scar burden and decreased LVEF. There is no significant  myocardium currently at jeopardy. Consider correlating LVEF with echo.  The left ventricular ejection fraction is moderately decreased (30-44%).   08/2016 echo Study Conclusions  - Left ventricle: The cavity size was normal. Wall thickness was increased in a pattern of mild LVH. Systolic function was severely reduced. The estimated ejection fraction was in the range of 25% to 30%. Abnormal global longitudinal strain of -11.3%. Diffuse hypokinesis. There is severe hypokinesis of the anteroseptal and apical myocardium. Doppler parameters are consistent with abnormal left ventricular relaxation (grade 1 diastolic dysfunction). - Ventricular septum: Septal motion showed abnormal function and dyssynergy. - Aortic valve: Mildly calcified annulus. Trileaflet; mildly calcified leaflets. Valve area (Vmax): 1.89 cm^2. - Mitral valve: Calcified annulus. Mildly thickened leaflets . There was mild regurgitation. - Right atrium: Central venous pressure (est): 3 mm Hg. - Atrial septum: No defect or patent foramen ovale was identified. - Tricuspid valve: There was trivial regurgitation. - Pulmonary arteries: PA peak pressure: 19 mm Hg (S). - Pericardium, extracardiac: There was no pericardial effusion.  Impressions:  - Mild LVH with LVEF approximately 25-30%. There is diffuse hypokinesis with septal dyssynergy and hypokinesis of the anteroseptal and apical myocardium. 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 Grade 1 diastolic dysfunction. No definite LV mural thrombus but images are somewhat limited, could consider a Definity contrast study. Mildly calcified mitral annulus with mildly thickened leaflets and mild mitral regurgitation. Mildly sclerotic aortic valve. Trivial tricuspid regurgitation with PASP estimated 19 mmHg.  09/2016 cath  Prox RCA lesion, 100 %stenosed.  LM lesion, 75 %stenosed.  Ost 1st Mrg lesion, 75 %stenosed.  Ost LAD to Prox LAD  lesion, 90 %stenosed.  Mid LAD lesion, 75 %stenosed.  LV end diastolic pressure is low.  There is no aortic valve stenosis.  LV end diastolic pressure is normal.  Normal PA pressures. Ao Sat 90%. PA sat 61%. CO 4.2 L.min; CI 2.4  Right radial loop.  Severe three vessel CAD. Known LV dysfunction from noninvasive testing, but well compensated. Due to high risk anatomy, will admit the patient and start IV heparin. Plan for cardiac Barton consult.   09/2016 TEE  Left ventricle: Normal left ventricular diastolic function and left atrial pressure. Cavity is mildly dilated. Thin-walled ventricle. LV  systolic function is moderately reduced with an EF of 35-40%. Wall motion is abnormal. Mid, anteroseptal wall motion is dyskinetic.  Septum: Abnormal ventricular septal motion consistent with post-operative state. Small membranous ventricular septal defect present with left to right shunting.  Aortic valve: The valve is trileaflet. Mild valve thickening present. Mild valve calcification present. Trace regurgitation.  Mitral valve: Mild regurgitation.  Tricuspid valve: Mild regurgitation. The tricuspid valve regurgitation jet is central.    Assessment and Plan  1. CAD/chronic systolic HF/ICM - appears euvolemic, weights are stable.  - we will repeat echo to reevaluate LVEF. Pending function may need further titration of meds.   2. Postop afib - no recurrence, he is off amio, has not required anticoag - continue to monitor  3. Carotid stenosis - continue to monitor at this time, asymptomatic. He would be high risk for a prophylactic Barton, likely would not consider intervention unless symptomatic  4. COPD - per Devin Devin Barton        Devin Barton, M.D.

## 2017-01-30 ENCOUNTER — Ambulatory Visit (INDEPENDENT_AMBULATORY_CARE_PROVIDER_SITE_OTHER): Payer: Medicare HMO | Admitting: Urology

## 2017-01-30 ENCOUNTER — Encounter: Payer: Self-pay | Admitting: Cardiology

## 2017-01-30 DIAGNOSIS — N3001 Acute cystitis with hematuria: Secondary | ICD-10-CM

## 2017-01-30 DIAGNOSIS — R338 Other retention of urine: Secondary | ICD-10-CM

## 2017-02-04 DIAGNOSIS — I10 Essential (primary) hypertension: Secondary | ICD-10-CM | POA: Diagnosis not present

## 2017-02-04 DIAGNOSIS — J9611 Chronic respiratory failure with hypoxia: Secondary | ICD-10-CM | POA: Diagnosis not present

## 2017-02-04 DIAGNOSIS — J441 Chronic obstructive pulmonary disease with (acute) exacerbation: Secondary | ICD-10-CM | POA: Diagnosis not present

## 2017-02-04 DIAGNOSIS — I2510 Atherosclerotic heart disease of native coronary artery without angina pectoris: Secondary | ICD-10-CM | POA: Diagnosis not present

## 2017-02-11 ENCOUNTER — Ambulatory Visit (HOSPITAL_COMMUNITY)
Admission: RE | Admit: 2017-02-11 | Discharge: 2017-02-11 | Disposition: A | Discharge status: Home / Self Care | Payer: Medicare HMO | Source: Ambulatory Visit | Attending: Cardiology | Admitting: Cardiology

## 2017-02-11 DIAGNOSIS — I5030 Unspecified diastolic (congestive) heart failure: Secondary | ICD-10-CM | POA: Insufficient documentation

## 2017-02-11 DIAGNOSIS — I5022 Chronic systolic (congestive) heart failure: Secondary | ICD-10-CM

## 2017-02-11 LAB — ECHOCARDIOGRAM COMPLETE
AVLVOTPG: 4 mmHg
E/e' ratio: 8.53
EWDT: 292 ms
FS: 12 % — AB (ref 28–44)
IV/PV OW: 0.96
LA diam end sys: 18 mm
LA vol A4C: 23 ml
LA vol index: 19 mL/m2
LA vol: 30.8 mL
LADIAMINDEX: 1.11 cm/m2
LASIZE: 18 mm
LV E/e' medial: 8.53
LV PW d: 9.22 mm — AB (ref 0.6–1.1)
LV TDI E'LATERAL: 6.53
LV TDI E'MEDIAL: 4.46
LV e' LATERAL: 6.53 cm/s
LV sys vol: 85 mL — AB (ref 21–61)
LVDIAVOL: 139 mL (ref 62–150)
LVDIAVOLIN: 86 mL/m2
LVEEAVG: 8.53
LVOT SV: 47 mL
LVOT VTI: 20.8 cm
LVOT area: 2.27 cm2
LVOTD: 17 mm
LVOTPV: 105 cm/s
LVSYSVOLIN: 52 mL/m2
Lateral S' vel: 9.57 cm/s
MV Dec: 292
MVPKAVEL: 100 m/s
MVPKEVEL: 55.7 m/s
RV TAPSE: 14.8 mm
Simpson's disk: 39
Stroke v: 54 ml

## 2017-02-11 MED ORDER — PERFLUTREN LIPID MICROSPHERE
1.0000 mL | INTRAVENOUS | Status: AC | PRN
  Administered 2017-02-11: 2 mL via INTRAVENOUS
  Administered 2017-02-11 (×3): 1 mL via INTRAVENOUS
  Filled 2017-02-11: qty 10

## 2017-02-11 NOTE — Progress Notes (Addendum)
*  PRELIMINARY RESULTS* Echocardiogram 2D Echocardiogram has been performed with Definity.  Stacey DrainWhite, Virgel Haro J 02/11/2017, 11:53 AM

## 2017-02-12 DIAGNOSIS — J449 Chronic obstructive pulmonary disease, unspecified: Secondary | ICD-10-CM | POA: Diagnosis not present

## 2017-02-12 DIAGNOSIS — I5021 Acute systolic (congestive) heart failure: Secondary | ICD-10-CM | POA: Diagnosis not present

## 2017-02-12 DIAGNOSIS — R69 Illness, unspecified: Secondary | ICD-10-CM | POA: Diagnosis not present

## 2017-02-13 ENCOUNTER — Ambulatory Visit: Payer: Medicare HMO | Admitting: Urology

## 2017-02-13 ENCOUNTER — Telehealth: Payer: Self-pay | Admitting: *Deleted

## 2017-02-13 DIAGNOSIS — R338 Other retention of urine: Secondary | ICD-10-CM

## 2017-02-13 DIAGNOSIS — R3912 Poor urinary stream: Secondary | ICD-10-CM

## 2017-02-13 NOTE — Telephone Encounter (Signed)
-----   Message from Antoine PocheJonathan F Branch, MD sent at 02/13/2017  2:24 PM EST ----- Echo shows heart function remains weak at 30-35%, we will need to continue adjuisting his meds. F/u with me or PA in 3-4 week  J BrancH MD

## 2017-02-13 NOTE — Telephone Encounter (Signed)
Called patient with test results. No answer. Left message to call back.  

## 2017-02-19 DIAGNOSIS — R69 Illness, unspecified: Secondary | ICD-10-CM | POA: Diagnosis not present

## 2017-02-20 ENCOUNTER — Ambulatory Visit: Payer: Medicare HMO | Admitting: Urology

## 2017-02-20 DIAGNOSIS — R3912 Poor urinary stream: Secondary | ICD-10-CM

## 2017-02-20 DIAGNOSIS — R338 Other retention of urine: Secondary | ICD-10-CM

## 2017-02-24 ENCOUNTER — Other Ambulatory Visit: Payer: Self-pay | Admitting: Cardiology

## 2017-02-25 DIAGNOSIS — R69 Illness, unspecified: Secondary | ICD-10-CM | POA: Diagnosis not present

## 2017-02-28 ENCOUNTER — Other Ambulatory Visit: Payer: Self-pay

## 2017-02-28 MED ORDER — ATORVASTATIN CALCIUM 40 MG PO TABS: 40.0000 mg | ORAL_TABLET | Freq: Every day | ORAL | 3 refills | Status: DC

## 2017-03-07 ENCOUNTER — Encounter (HOSPITAL_COMMUNITY): Payer: Self-pay

## 2017-03-07 ENCOUNTER — Emergency Department (HOSPITAL_COMMUNITY): Payer: Medicare HMO

## 2017-03-07 ENCOUNTER — Inpatient Hospital Stay (HOSPITAL_COMMUNITY)
Admission: EM | Admit: 2017-03-07 | Discharge: 2017-03-14 | DRG: 183 | Disposition: A | Discharge status: Home / Self Care | Payer: Medicare HMO | Attending: General Surgery | Admitting: General Surgery

## 2017-03-07 ENCOUNTER — Inpatient Hospital Stay (HOSPITAL_COMMUNITY): Payer: Medicare HMO

## 2017-03-07 DIAGNOSIS — I509 Heart failure, unspecified: Secondary | ICD-10-CM | POA: Diagnosis present

## 2017-03-07 DIAGNOSIS — K311 Adult hypertrophic pyloric stenosis: Secondary | ICD-10-CM | POA: Diagnosis present

## 2017-03-07 DIAGNOSIS — I2510 Atherosclerotic heart disease of native coronary artery without angina pectoris: Secondary | ICD-10-CM | POA: Diagnosis present

## 2017-03-07 DIAGNOSIS — S01112A Laceration without foreign body of left eyelid and periocular area, initial encounter: Secondary | ICD-10-CM | POA: Diagnosis not present

## 2017-03-07 DIAGNOSIS — Z87891 Personal history of nicotine dependence: Secondary | ICD-10-CM

## 2017-03-07 DIAGNOSIS — R0781 Pleurodynia: Secondary | ICD-10-CM | POA: Diagnosis not present

## 2017-03-07 DIAGNOSIS — S60012A Contusion of left thumb without damage to nail, initial encounter: Secondary | ICD-10-CM | POA: Diagnosis present

## 2017-03-07 DIAGNOSIS — Z8719 Personal history of other diseases of the digestive system: Secondary | ICD-10-CM

## 2017-03-07 DIAGNOSIS — S3993XA Unspecified injury of pelvis, initial encounter: Secondary | ICD-10-CM | POA: Diagnosis not present

## 2017-03-07 DIAGNOSIS — S0181XA Laceration without foreign body of other part of head, initial encounter: Secondary | ICD-10-CM | POA: Diagnosis present

## 2017-03-07 DIAGNOSIS — J449 Chronic obstructive pulmonary disease, unspecified: Secondary | ICD-10-CM | POA: Diagnosis not present

## 2017-03-07 DIAGNOSIS — R269 Unspecified abnormalities of gait and mobility: Secondary | ICD-10-CM | POA: Diagnosis not present

## 2017-03-07 DIAGNOSIS — R933 Abnormal findings on diagnostic imaging of other parts of digestive tract: Secondary | ICD-10-CM | POA: Diagnosis not present

## 2017-03-07 DIAGNOSIS — J9811 Atelectasis: Secondary | ICD-10-CM | POA: Diagnosis not present

## 2017-03-07 DIAGNOSIS — T508X1A Poisoning by diagnostic agents, accidental (unintentional), initial encounter: Secondary | ICD-10-CM | POA: Diagnosis present

## 2017-03-07 DIAGNOSIS — S2243XA Multiple fractures of ribs, bilateral, initial encounter for closed fracture: Principal | ICD-10-CM | POA: Diagnosis present

## 2017-03-07 DIAGNOSIS — S022XXA Fracture of nasal bones, initial encounter for closed fracture: Secondary | ICD-10-CM | POA: Diagnosis present

## 2017-03-07 DIAGNOSIS — I4891 Unspecified atrial fibrillation: Secondary | ICD-10-CM | POA: Diagnosis present

## 2017-03-07 DIAGNOSIS — K219 Gastro-esophageal reflux disease without esophagitis: Secondary | ICD-10-CM | POA: Diagnosis present

## 2017-03-07 DIAGNOSIS — S199XXA Unspecified injury of neck, initial encounter: Secondary | ICD-10-CM | POA: Diagnosis not present

## 2017-03-07 DIAGNOSIS — S2249XA Multiple fractures of ribs, unspecified side, initial encounter for closed fracture: Secondary | ICD-10-CM | POA: Diagnosis present

## 2017-03-07 DIAGNOSIS — S3632XA Contusion of stomach, initial encounter: Secondary | ICD-10-CM | POA: Diagnosis present

## 2017-03-07 DIAGNOSIS — S41101A Unspecified open wound of right upper arm, initial encounter: Secondary | ICD-10-CM | POA: Diagnosis not present

## 2017-03-07 DIAGNOSIS — S0990XA Unspecified injury of head, initial encounter: Secondary | ICD-10-CM | POA: Diagnosis not present

## 2017-03-07 DIAGNOSIS — R52 Pain, unspecified: Secondary | ICD-10-CM | POA: Diagnosis present

## 2017-03-07 DIAGNOSIS — Z951 Presence of aortocoronary bypass graft: Secondary | ICD-10-CM

## 2017-03-07 DIAGNOSIS — S299XXA Unspecified injury of thorax, initial encounter: Secondary | ICD-10-CM | POA: Diagnosis not present

## 2017-03-07 DIAGNOSIS — S3991XA Unspecified injury of abdomen, initial encounter: Secondary | ICD-10-CM | POA: Diagnosis not present

## 2017-03-07 DIAGNOSIS — R05 Cough: Secondary | ICD-10-CM | POA: Diagnosis not present

## 2017-03-07 DIAGNOSIS — M25521 Pain in right elbow: Secondary | ICD-10-CM | POA: Diagnosis not present

## 2017-03-07 DIAGNOSIS — R51 Headache: Secondary | ICD-10-CM | POA: Diagnosis not present

## 2017-03-07 DIAGNOSIS — S2241XD Multiple fractures of ribs, right side, subsequent encounter for fracture with routine healing: Secondary | ICD-10-CM

## 2017-03-07 DIAGNOSIS — K440 Diaphragmatic hernia with obstruction, without gangrene: Secondary | ICD-10-CM | POA: Diagnosis not present

## 2017-03-07 DIAGNOSIS — S59901A Unspecified injury of right elbow, initial encounter: Secondary | ICD-10-CM | POA: Diagnosis not present

## 2017-03-07 DIAGNOSIS — M79645 Pain in left finger(s): Secondary | ICD-10-CM

## 2017-03-07 DIAGNOSIS — Y92410 Unspecified street and highway as the place of occurrence of the external cause: Secondary | ICD-10-CM | POA: Diagnosis not present

## 2017-03-07 DIAGNOSIS — R079 Chest pain, unspecified: Secondary | ICD-10-CM | POA: Diagnosis not present

## 2017-03-07 DIAGNOSIS — J189 Pneumonia, unspecified organism: Secondary | ICD-10-CM

## 2017-03-07 DIAGNOSIS — Y92239 Unspecified place in hospital as the place of occurrence of the external cause: Secondary | ICD-10-CM | POA: Diagnosis present

## 2017-03-07 DIAGNOSIS — R0603 Acute respiratory distress: Secondary | ICD-10-CM

## 2017-03-07 DIAGNOSIS — G4489 Other headache syndrome: Secondary | ICD-10-CM | POA: Diagnosis not present

## 2017-03-07 DIAGNOSIS — J690 Pneumonitis due to inhalation of food and vomit: Secondary | ICD-10-CM

## 2017-03-07 DIAGNOSIS — J698 Pneumonitis due to inhalation of other solids and liquids: Secondary | ICD-10-CM | POA: Diagnosis present

## 2017-03-07 DIAGNOSIS — M542 Cervicalgia: Secondary | ICD-10-CM | POA: Diagnosis not present

## 2017-03-07 DIAGNOSIS — I5021 Acute systolic (congestive) heart failure: Secondary | ICD-10-CM | POA: Diagnosis not present

## 2017-03-07 DIAGNOSIS — R109 Unspecified abdominal pain: Secondary | ICD-10-CM | POA: Diagnosis not present

## 2017-03-07 DIAGNOSIS — Z9981 Dependence on supplemental oxygen: Secondary | ICD-10-CM | POA: Diagnosis not present

## 2017-03-07 DIAGNOSIS — M79642 Pain in left hand: Secondary | ICD-10-CM | POA: Diagnosis not present

## 2017-03-07 DIAGNOSIS — R0600 Dyspnea, unspecified: Secondary | ICD-10-CM | POA: Diagnosis not present

## 2017-03-07 DIAGNOSIS — S0993XA Unspecified injury of face, initial encounter: Secondary | ICD-10-CM | POA: Diagnosis not present

## 2017-03-07 DIAGNOSIS — S6992XA Unspecified injury of left wrist, hand and finger(s), initial encounter: Secondary | ICD-10-CM | POA: Diagnosis not present

## 2017-03-07 DIAGNOSIS — J181 Lobar pneumonia, unspecified organism: Secondary | ICD-10-CM | POA: Diagnosis not present

## 2017-03-07 DIAGNOSIS — S098XXA Other specified injuries of head, initial encounter: Secondary | ICD-10-CM | POA: Diagnosis not present

## 2017-03-07 HISTORY — DX: Unspecified osteoarthritis, unspecified site: M19.90

## 2017-03-07 HISTORY — DX: Atherosclerotic heart disease of native coronary artery without angina pectoris: I25.10

## 2017-03-07 HISTORY — DX: Headache, unspecified: R51.9

## 2017-03-07 HISTORY — DX: Personal history of other diseases of the digestive system: Z87.19

## 2017-03-07 HISTORY — DX: Unspecified atrial fibrillation: I48.91

## 2017-03-07 HISTORY — DX: Headache: R51

## 2017-03-07 LAB — COMPREHENSIVE METABOLIC PANEL
ALBUMIN: 2.8 g/dL — AB (ref 3.5–5.0)
ALK PHOS: 57 U/L (ref 38–126)
ALT: 10 U/L — ABNORMAL LOW (ref 17–63)
AST: 32 U/L (ref 15–41)
Anion gap: 7 (ref 5–15)
BILIRUBIN TOTAL: 0.8 mg/dL (ref 0.3–1.2)
BUN: 18 mg/dL (ref 6–20)
CO2: 21 mmol/L — AB (ref 22–32)
Calcium: 7.4 mg/dL — ABNORMAL LOW (ref 8.9–10.3)
Chloride: 106 mmol/L (ref 101–111)
Creatinine, Ser: 0.75 mg/dL (ref 0.61–1.24)
GFR calc Af Amer: >60 mL/min (ref >60)
GFR calc non Af Amer: >60 mL/min (ref >60)
GLUCOSE: 104 mg/dL — AB (ref 65–99)
POTASSIUM: 4.5 mmol/L (ref 3.5–5.1)
SODIUM: 134 mmol/L — AB (ref 135–145)
TOTAL PROTEIN: 4.9 g/dL — AB (ref 6.5–8.1)

## 2017-03-07 LAB — I-STAT CHEM 8, ED
BUN: 23 mg/dL — ABNORMAL HIGH (ref 6–20)
Calcium, Ion: 1.14 mmol/L — ABNORMAL LOW (ref 1.15–1.40)
Chloride: 98 mmol/L — ABNORMAL LOW (ref 101–111)
Creatinine, Ser: 0.9 mg/dL (ref 0.61–1.24)
Glucose, Bld: 110 mg/dL — ABNORMAL HIGH (ref 65–99)
HEMATOCRIT: 39 % (ref 39.0–52.0)
HEMOGLOBIN: 13.3 g/dL (ref 13.0–17.0)
Potassium: 4.5 mmol/L (ref 3.5–5.1)
SODIUM: 136 mmol/L (ref 135–145)
TCO2: 28 mmol/L (ref 22–32)

## 2017-03-07 LAB — CBC
HCT: 34.7 % — ABNORMAL LOW (ref 39.0–52.0)
Hemoglobin: 11.1 g/dL — ABNORMAL LOW (ref 13.0–17.0)
MCH: 30.4 pg (ref 26.0–34.0)
MCHC: 32 g/dL (ref 30.0–36.0)
MCV: 95.1 fL (ref 78.0–100.0)
Platelets: 299 10*3/uL (ref 150–400)
RBC: 3.65 MIL/uL — AB (ref 4.22–5.81)
RDW: 15.1 % (ref 11.5–15.5)
WBC: 14.4 10*3/uL — AB (ref 4.0–10.5)

## 2017-03-07 LAB — URINALYSIS, ROUTINE W REFLEX MICROSCOPIC
Bilirubin Urine: NEGATIVE
GLUCOSE, UA: NEGATIVE mg/dL
KETONES UR: NEGATIVE mg/dL
LEUKOCYTES UA: NEGATIVE
Nitrite: POSITIVE — AB
PH: 6 (ref 5.0–8.0)
PROTEIN: NEGATIVE mg/dL
Specific Gravity, Urine: 1.016 (ref 1.005–1.030)
Squamous Epithelial / LPF: NONE SEEN

## 2017-03-07 LAB — PROTIME-INR
INR: 1.02
Prothrombin Time: 13.3 seconds (ref 11.4–15.2)

## 2017-03-07 LAB — SAMPLE TO BLOOD BANK

## 2017-03-07 LAB — LIPASE, BLOOD: Lipase: 23 U/L (ref 11–51)

## 2017-03-07 LAB — ETHANOL

## 2017-03-07 LAB — I-STAT CG4 LACTIC ACID, ED: Lactic Acid, Venous: 1.86 mmol/L (ref 0.5–1.9)

## 2017-03-07 LAB — CDS SEROLOGY

## 2017-03-07 MED ORDER — FENTANYL CITRATE (PF) 100 MCG/2ML IJ SOLN
100.0000 ug | Freq: Once | INTRAMUSCULAR | Status: AC
  Administered 2017-03-07: 100 ug via INTRAVENOUS
  Filled 2017-03-07: qty 2

## 2017-03-07 MED ORDER — ONDANSETRON HCL 4 MG/2ML IJ SOLN
4.0000 mg | Freq: Four times a day (QID) | INTRAMUSCULAR | Status: DC | PRN
  Administered 2017-03-07: 4 mg via INTRAVENOUS
  Filled 2017-03-07: qty 2

## 2017-03-07 MED ORDER — FENTANYL CITRATE (PF) 100 MCG/2ML IJ SOLN
50.0000 ug | Freq: Once | INTRAMUSCULAR | Status: AC
  Administered 2017-03-07: 50 ug via INTRAVENOUS
  Filled 2017-03-07: qty 2

## 2017-03-07 MED ORDER — ONDANSETRON 4 MG PO TBDP: 4.0000 mg | ORAL_TABLET | Freq: Four times a day (QID) | ORAL | Status: DC | PRN

## 2017-03-07 MED ORDER — ORAL CARE MOUTH RINSE
15.0000 mL | Freq: Two times a day (BID) | OROMUCOSAL | Status: DC
  Administered 2017-03-07 – 2017-03-10 (×6): 15 mL via OROMUCOSAL

## 2017-03-07 MED ORDER — ATORVASTATIN CALCIUM 40 MG PO TABS
40.0000 mg | ORAL_TABLET | Freq: Every day | ORAL | Status: DC
  Administered 2017-03-07 – 2017-03-13 (×3): 40 mg via ORAL
  Filled 2017-03-07 (×3): qty 1

## 2017-03-07 MED ORDER — IOPAMIDOL (ISOVUE-300) INJECTION 61%
INTRAVENOUS | Status: AC
Start: 2017-03-07 — End: 2017-03-07
  Administered 2017-03-07: 100 mL
  Filled 2017-03-07: qty 100

## 2017-03-07 MED ORDER — HYDROMORPHONE HCL 1 MG/ML IJ SOLN
1.0000 mg | INTRAMUSCULAR | Status: DC | PRN
  Administered 2017-03-07: 1 mg via INTRAVENOUS
  Administered 2017-03-08: 0.5 mg via INTRAVENOUS
  Administered 2017-03-08 (×3): 1 mg via INTRAVENOUS
  Filled 2017-03-07 (×4): qty 1

## 2017-03-07 MED ORDER — HYDRALAZINE HCL 20 MG/ML IJ SOLN: 10.0000 mg | INTRAMUSCULAR | Status: DC | PRN

## 2017-03-07 MED ORDER — ENOXAPARIN SODIUM 40 MG/0.4ML ~~LOC~~ SOLN
40.0000 mg | SUBCUTANEOUS | Status: DC
  Administered 2017-03-08 – 2017-03-14 (×7): 40 mg via SUBCUTANEOUS
  Filled 2017-03-07 (×7): qty 0.4

## 2017-03-07 MED ORDER — TAMSULOSIN HCL 0.4 MG PO CAPS: 0.4000 mg | ORAL_CAPSULE | Freq: Every day | ORAL | Status: DC

## 2017-03-07 MED ORDER — PANTOPRAZOLE SODIUM 40 MG PO TBEC: 40.0000 mg | DELAYED_RELEASE_TABLET | Freq: Every day | ORAL | Status: DC

## 2017-03-07 MED ORDER — LISINOPRIL 2.5 MG PO TABS
2.5000 mg | ORAL_TABLET | Freq: Every day | ORAL | Status: DC
  Administered 2017-03-12 – 2017-03-14 (×3): 2.5 mg via ORAL
  Filled 2017-03-07 (×3): qty 1

## 2017-03-07 MED ORDER — LACTATED RINGERS IV BOLUS (SEPSIS)
500.0000 mL | Freq: Once | INTRAVENOUS | Status: AC
  Administered 2017-03-07: 500 mL via INTRAVENOUS

## 2017-03-07 MED ORDER — FINASTERIDE 5 MG PO TABS
5.0000 mg | ORAL_TABLET | Freq: Every day | ORAL | Status: DC
  Administered 2017-03-12 – 2017-03-14 (×3): 5 mg via ORAL
  Filled 2017-03-07 (×8): qty 1

## 2017-03-07 MED ORDER — KCL IN DEXTROSE-NACL 10-5-0.45 MEQ/L-%-% IV SOLN
INTRAVENOUS | Status: DC
  Administered 2017-03-07 – 2017-03-10 (×3): via INTRAVENOUS
  Administered 2017-03-11: 1000 mL via INTRAVENOUS
  Administered 2017-03-12 – 2017-03-13 (×4): via INTRAVENOUS
  Filled 2017-03-07 (×8): qty 1000

## 2017-03-07 MED ORDER — OXYCODONE HCL 5 MG PO TABS
5.0000 mg | ORAL_TABLET | ORAL | Status: DC | PRN
  Administered 2017-03-12: 5 mg via ORAL
  Filled 2017-03-07: qty 1

## 2017-03-07 MED ORDER — FUROSEMIDE 40 MG PO TABS
40.0000 mg | ORAL_TABLET | Freq: Every day | ORAL | Status: DC
  Administered 2017-03-12 – 2017-03-14 (×3): 40 mg via ORAL
  Filled 2017-03-07 (×3): qty 1

## 2017-03-07 MED ORDER — BUDESONIDE 0.25 MG/2ML IN SUSP
2.0000 mL | Freq: Two times a day (BID) | RESPIRATORY_TRACT | Status: DC
  Administered 2017-03-08 – 2017-03-09 (×4): 0.25 mg via RESPIRATORY_TRACT
  Filled 2017-03-07 (×5): qty 2

## 2017-03-07 MED ORDER — LIDOCAINE-EPINEPHRINE (PF) 2 %-1:200000 IJ SOLN
10.0000 mL | Freq: Once | INTRAMUSCULAR | Status: AC
  Administered 2017-03-07: 10 mL via INTRADERMAL
  Filled 2017-03-07: qty 20

## 2017-03-07 MED ORDER — MORPHINE SULFATE (PF) 4 MG/ML IV SOLN
4.0000 mg | Freq: Once | INTRAVENOUS | Status: AC
  Administered 2017-03-07: 4 mg via INTRAVENOUS
  Filled 2017-03-07: qty 1

## 2017-03-07 MED ORDER — IPRATROPIUM BROMIDE 0.02 % IN SOLN
0.5000 mg | Freq: Two times a day (BID) | RESPIRATORY_TRACT | Status: DC | PRN
  Administered 2017-03-09: 0.5 mg via RESPIRATORY_TRACT
  Filled 2017-03-07: qty 2.5
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
## 2017-03-07 NOTE — ED Notes (Signed)
Attempted in and out cath unable to advance catheter. Made MD aware.

## 2017-03-07 NOTE — ED Notes (Signed)
Patient transported to CT 
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
## 2017-03-07 NOTE — ED Notes (Signed)
Pt taken to xray 

## 2017-03-08 ENCOUNTER — Inpatient Hospital Stay (HOSPITAL_COMMUNITY): Payer: Medicare HMO

## 2017-03-08 LAB — CBC
HEMATOCRIT: 33.7 % — AB (ref 39.0–52.0)
Hemoglobin: 10.9 g/dL — ABNORMAL LOW (ref 13.0–17.0)
MCH: 30.9 pg (ref 26.0–34.0)
MCHC: 32.3 g/dL (ref 30.0–36.0)
MCV: 95.5 fL (ref 78.0–100.0)
Platelets: 252 10*3/uL (ref 150–400)
RBC: 3.53 MIL/uL — ABNORMAL LOW (ref 4.22–5.81)
RDW: 15.1 % (ref 11.5–15.5)
WBC: 8.6 10*3/uL (ref 4.0–10.5)

## 2017-03-08 LAB — POCT I-STAT 3, ART BLOOD GAS (G3+)
Acid-Base Excess: 1 mmol/L (ref 0.0–2.0)
Bicarbonate: 27.6 mmol/L (ref 20.0–28.0)
O2 Saturation: 94 %
PCO2 ART: 50.7 mmHg — AB (ref 32.0–48.0)
PH ART: 7.345 — AB (ref 7.350–7.450)
TCO2: 29 mmol/L (ref 22–32)
pO2, Arterial: 78 mmHg — ABNORMAL LOW (ref 83.0–108.0)

## 2017-03-08 LAB — COMPREHENSIVE METABOLIC PANEL
ALK PHOS: 61 U/L (ref 38–126)
ALT: 19 U/L (ref 17–63)
ANION GAP: 5 (ref 5–15)
AST: 34 U/L (ref 15–41)
Albumin: 3 g/dL — ABNORMAL LOW (ref 3.5–5.0)
BILIRUBIN TOTAL: 0.5 mg/dL (ref 0.3–1.2)
BUN: 11 mg/dL (ref 6–20)
CALCIUM: 8.7 mg/dL — AB (ref 8.9–10.3)
CO2: 28 mmol/L (ref 22–32)
Chloride: 101 mmol/L (ref 101–111)
Creatinine, Ser: 0.71 mg/dL (ref 0.61–1.24)
Glucose, Bld: 121 mg/dL — ABNORMAL HIGH (ref 65–99)
POTASSIUM: 4.5 mmol/L (ref 3.5–5.1)
Sodium: 134 mmol/L — ABNORMAL LOW (ref 135–145)
TOTAL PROTEIN: 5.6 g/dL — AB (ref 6.5–8.1)

## 2017-03-08 MED ORDER — PANTOPRAZOLE SODIUM 40 MG IV SOLR
40.0000 mg | INTRAVENOUS | Status: DC
  Administered 2017-03-08 – 2017-03-12 (×5): 40 mg via INTRAVENOUS
  Filled 2017-03-08 (×4): qty 40

## 2017-03-08 MED ORDER — LORAZEPAM 2 MG/ML IJ SOLN
0.5000 mg | Freq: Four times a day (QID) | INTRAMUSCULAR | Status: DC | PRN
  Administered 2017-03-08: 0.5 mg via INTRAVENOUS

## 2017-03-08 MED ORDER — LORAZEPAM 2 MG/ML IJ SOLN
INTRAMUSCULAR | Status: AC
  Filled 2017-03-08: qty 1

## 2017-03-08 MED ORDER — HYDROMORPHONE HCL 1 MG/ML IJ SOLN
0.5000 mg | INTRAMUSCULAR | Status: DC | PRN
  Administered 2017-03-09 (×3): 1 mg via INTRAVENOUS
  Administered 2017-03-12 (×2): 0.5 mg via INTRAVENOUS
  Administered 2017-03-12: 1 mg via INTRAVENOUS
  Administered 2017-03-12 – 2017-03-13 (×4): 0.5 mg via INTRAVENOUS
  Filled 2017-03-08 (×2): qty 0.5
  Filled 2017-03-08: qty 1
  Filled 2017-03-08: qty 0.5
  Filled 2017-03-08: qty 1
  Filled 2017-03-08 (×2): qty 0.5
  Filled 2017-03-08 (×2): qty 1
  Filled 2017-03-08: qty 0.5

## 2017-03-08 MED ORDER — ACETAMINOPHEN 650 MG RE SUPP
650.0000 mg | RECTAL | Status: DC | PRN
  Administered 2017-03-08 – 2017-03-09 (×2): 650 mg via RECTAL
  Filled 2017-03-08 (×2): qty 1

## 2017-03-08 MED ORDER — ALBUTEROL SULFATE (2.5 MG/3ML) 0.083% IN NEBU
2.5000 mg | INHALATION_SOLUTION | RESPIRATORY_TRACT | Status: DC | PRN
  Administered 2017-03-09 (×2): 2.5 mg via RESPIRATORY_TRACT
  Filled 2017-03-08 (×2): qty 3

## 2017-03-08 MED ORDER — HYDROMORPHONE HCL 1 MG/ML IJ SOLN
INTRAMUSCULAR | Status: AC
  Administered 2017-03-08: 0.5 mg via INTRAVENOUS
  Filled 2017-03-08: qty 0.5

## 2017-03-08 MED ORDER — IOPAMIDOL (ISOVUE-300) INJECTION 61%
150.0000 mL | Freq: Once | INTRAVENOUS | Status: AC | PRN
  Administered 2017-03-08: 100 mL via ORAL

## 2017-03-08 MED ORDER — CEFTRIAXONE SODIUM 1 G IJ SOLR
1.0000 g | INTRAMUSCULAR | Status: DC
  Administered 2017-03-08 – 2017-03-13 (×6): 1 g via INTRAVENOUS
  Filled 2017-03-08 (×7): qty 10

## 2017-03-08 MED ORDER — IOPAMIDOL (ISOVUE-300) INJECTION 61%
INTRAVENOUS | Status: AC
  Filled 2017-03-08: qty 300

## 2017-03-08 NOTE — Progress Notes (Signed)
Patient yelling for more oxygen and that he feels like he cannot breath. Oxygen sats 96% on 3L [place], heart rate increased to 125. Patient with increased intermittent confusion.   Patient also noted to have difficulty moving left thumb, it was found to have increased swelling and warm to touch.
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
## 2017-03-08 NOTE — Plan of Care (Signed)
Patient able to rest after receiving pain medication.

## 2017-03-09 ENCOUNTER — Inpatient Hospital Stay (HOSPITAL_COMMUNITY): Payer: Medicare HMO

## 2017-03-09 LAB — CBC WITH DIFFERENTIAL/PLATELET
BASOS PCT: 0 %
Basophils Absolute: 0 10*3/uL (ref 0.0–0.1)
EOS ABS: 0 10*3/uL (ref 0.0–0.7)
Eosinophils Relative: 0 %
HEMATOCRIT: 33.5 % — AB (ref 39.0–52.0)
Hemoglobin: 10.4 g/dL — ABNORMAL LOW (ref 13.0–17.0)
Lymphocytes Relative: 11 %
Lymphs Abs: 1.2 10*3/uL (ref 0.7–4.0)
MCH: 29.8 pg (ref 26.0–34.0)
MCHC: 31 g/dL (ref 30.0–36.0)
MCV: 96 fL (ref 78.0–100.0)
MONO ABS: 1.3 10*3/uL — AB (ref 0.1–1.0)
MONOS PCT: 12 %
Neutro Abs: 8.2 10*3/uL — ABNORMAL HIGH (ref 1.7–7.7)
Neutrophils Relative %: 77 %
Platelets: 270 10*3/uL (ref 150–400)
RBC: 3.49 MIL/uL — ABNORMAL LOW (ref 4.22–5.81)
RDW: 15.1 % (ref 11.5–15.5)
WBC: 10.8 10*3/uL — ABNORMAL HIGH (ref 4.0–10.5)

## 2017-03-09 LAB — BASIC METABOLIC PANEL
Anion gap: 6 (ref 5–15)
BUN: 9 mg/dL (ref 6–20)
CO2: 28 mmol/L (ref 22–32)
Calcium: 8.5 mg/dL — ABNORMAL LOW (ref 8.9–10.3)
Chloride: 101 mmol/L (ref 101–111)
Creatinine, Ser: 0.62 mg/dL (ref 0.61–1.24)
GFR calc Af Amer: >60 mL/min (ref >60)
GFR calc non Af Amer: >60 mL/min (ref >60)
Glucose, Bld: 134 mg/dL — ABNORMAL HIGH (ref 65–99)
Potassium: 4.1 mmol/L (ref 3.5–5.1)
Sodium: 135 mmol/L (ref 135–145)

## 2017-03-09 LAB — GLUCOSE, CAPILLARY: Glucose-Capillary: 116 mg/dL — ABNORMAL HIGH (ref 65–99)

## 2017-03-09 MED ORDER — LORAZEPAM 2 MG/ML IJ SOLN
1.0000 mg | INTRAMUSCULAR | Status: DC | PRN
  Administered 2017-03-09 – 2017-03-10 (×2): 1 mg via INTRAVENOUS
  Filled 2017-03-09 (×4): qty 1

## 2017-03-09 MED ORDER — FUROSEMIDE 10 MG/ML IJ SOLN
40.0000 mg | Freq: Once | INTRAMUSCULAR | Status: AC
  Administered 2017-03-09: 40 mg via INTRAVENOUS
  Filled 2017-03-09: qty 4

## 2017-03-09 MED ORDER — BUDESONIDE 0.25 MG/2ML IN SUSP
2.0000 mL | Freq: Two times a day (BID) | RESPIRATORY_TRACT | Status: DC
  Administered 2017-03-09 – 2017-03-14 (×10): 0.25 mg via RESPIRATORY_TRACT
  Filled 2017-03-09 (×9): qty 2
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
## 2017-03-10 LAB — BASIC METABOLIC PANEL
ANION GAP: 9 (ref 5–15)
BUN: 12 mg/dL (ref 6–20)
CO2: 28 mmol/L (ref 22–32)
Calcium: 8.8 mg/dL — ABNORMAL LOW (ref 8.9–10.3)
Chloride: 99 mmol/L — ABNORMAL LOW (ref 101–111)
Creatinine, Ser: 0.7 mg/dL (ref 0.61–1.24)
Glucose, Bld: 137 mg/dL — ABNORMAL HIGH (ref 65–99)
POTASSIUM: 3.8 mmol/L (ref 3.5–5.1)
SODIUM: 136 mmol/L (ref 135–145)

## 2017-03-10 LAB — CBC WITH DIFFERENTIAL/PLATELET
BASOS ABS: 0 10*3/uL (ref 0.0–0.1)
BASOS PCT: 0 %
EOS ABS: 0 10*3/uL (ref 0.0–0.7)
EOS PCT: 0 %
HCT: 33.1 % — ABNORMAL LOW (ref 39.0–52.0)
Hemoglobin: 10.6 g/dL — ABNORMAL LOW (ref 13.0–17.0)
Lymphocytes Relative: 10 %
Lymphs Abs: 1.6 10*3/uL (ref 0.7–4.0)
MCH: 30.6 pg (ref 26.0–34.0)
MCHC: 32 g/dL (ref 30.0–36.0)
MCV: 95.7 fL (ref 78.0–100.0)
Monocytes Absolute: 1.5 10*3/uL — ABNORMAL HIGH (ref 0.1–1.0)
Monocytes Relative: 9 %
NEUTROS PCT: 81 %
Neutro Abs: 13.1 10*3/uL — ABNORMAL HIGH (ref 1.7–7.7)
PLATELETS: 279 10*3/uL (ref 150–400)
RBC: 3.46 MIL/uL — AB (ref 4.22–5.81)
RDW: 15.2 % (ref 11.5–15.5)
WBC: 16.1 10*3/uL — AB (ref 4.0–10.5)

## 2017-03-10 LAB — URINE CULTURE

## 2017-03-10 MED ORDER — CHLORHEXIDINE GLUCONATE 0.12 % MT SOLN
15.0000 mL | Freq: Two times a day (BID) | OROMUCOSAL | Status: DC
  Administered 2017-03-10 – 2017-03-14 (×9): 15 mL via OROMUCOSAL
  Filled 2017-03-10 (×6): qty 15

## 2017-03-10 MED ORDER — ORAL CARE MOUTH RINSE
15.0000 mL | Freq: Two times a day (BID) | OROMUCOSAL | Status: DC
  Administered 2017-03-10 – 2017-03-13 (×7): 15 mL via OROMUCOSAL
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
## 2017-03-11 ENCOUNTER — Inpatient Hospital Stay (HOSPITAL_COMMUNITY): Payer: Medicare HMO

## 2017-03-11 LAB — BASIC METABOLIC PANEL
ANION GAP: 8 (ref 5–15)
BUN: 16 mg/dL (ref 6–20)
CO2: 27 mmol/L (ref 22–32)
Calcium: 9 mg/dL (ref 8.9–10.3)
Chloride: 104 mmol/L (ref 101–111)
Creatinine, Ser: 0.65 mg/dL (ref 0.61–1.24)
GFR calc Af Amer: >60 mL/min (ref >60)
GLUCOSE: 117 mg/dL — AB (ref 65–99)
POTASSIUM: 3.9 mmol/L (ref 3.5–5.1)
Sodium: 139 mmol/L (ref 135–145)

## 2017-03-11 LAB — CBC WITH DIFFERENTIAL/PLATELET
BASOS ABS: 0 10*3/uL (ref 0.0–0.1)
Basophils Relative: 0 %
EOS PCT: 0 %
Eosinophils Absolute: 0 10*3/uL (ref 0.0–0.7)
HCT: 30.8 % — ABNORMAL LOW (ref 39.0–52.0)
Hemoglobin: 10 g/dL — ABNORMAL LOW (ref 13.0–17.0)
LYMPHS PCT: 17 %
Lymphs Abs: 2.3 10*3/uL (ref 0.7–4.0)
MCH: 30.7 pg (ref 26.0–34.0)
MCHC: 32.5 g/dL (ref 30.0–36.0)
MCV: 94.5 fL (ref 78.0–100.0)
Monocytes Absolute: 1.4 10*3/uL — ABNORMAL HIGH (ref 0.1–1.0)
Monocytes Relative: 10 %
NEUTROS ABS: 10.3 10*3/uL — AB (ref 1.7–7.7)
NEUTROS PCT: 73 %
PLATELETS: 310 10*3/uL (ref 150–400)
RBC: 3.26 MIL/uL — AB (ref 4.22–5.81)
RDW: 15.1 % (ref 11.5–15.5)
WBC: 14.1 10*3/uL — AB (ref 4.0–10.5)

## 2017-03-11 MED ORDER — ALBUTEROL SULFATE (2.5 MG/3ML) 0.083% IN NEBU
2.5000 mg | INHALATION_SOLUTION | RESPIRATORY_TRACT | Status: DC | PRN
  Administered 2017-03-12: 2.5 mg via RESPIRATORY_TRACT
  Filled 2017-03-11: qty 3

## 2017-03-11 MED ORDER — IPRATROPIUM BROMIDE 0.02 % IN SOLN
0.5000 mg | Freq: Two times a day (BID) | RESPIRATORY_TRACT | Status: DC
  Administered 2017-03-12 – 2017-03-14 (×5): 0.5 mg via RESPIRATORY_TRACT
  Filled 2017-03-11 (×5): qty 2.5
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
## 2017-03-11 NOTE — Plan of Care (Signed)
Pt will be going for a modified swallow
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
## 2017-03-12 ENCOUNTER — Inpatient Hospital Stay (HOSPITAL_COMMUNITY): Payer: Medicare HMO

## 2017-03-12 LAB — BASIC METABOLIC PANEL
Anion gap: 8 (ref 5–15)
BUN: 19 mg/dL (ref 6–20)
CHLORIDE: 108 mmol/L (ref 101–111)
CO2: 26 mmol/L (ref 22–32)
CREATININE: 0.7 mg/dL (ref 0.61–1.24)
Calcium: 9.1 mg/dL (ref 8.9–10.3)
GFR calc non Af Amer: >60 mL/min (ref >60)
Glucose, Bld: 123 mg/dL — ABNORMAL HIGH (ref 65–99)
POTASSIUM: 3.5 mmol/L (ref 3.5–5.1)
SODIUM: 142 mmol/L (ref 135–145)

## 2017-03-12 LAB — CBC
HEMATOCRIT: 30.2 % — AB (ref 39.0–52.0)
HEMOGLOBIN: 9.9 g/dL — AB (ref 13.0–17.0)
MCH: 31 pg (ref 26.0–34.0)
MCHC: 32.8 g/dL (ref 30.0–36.0)
MCV: 94.7 fL (ref 78.0–100.0)
Platelets: 351 10*3/uL (ref 150–400)
RBC: 3.19 MIL/uL — AB (ref 4.22–5.81)
RDW: 15.1 % (ref 11.5–15.5)
WBC: 11.4 10*3/uL — ABNORMAL HIGH (ref 4.0–10.5)

## 2017-03-12 LAB — EXPECTORATED SPUTUM ASSESSMENT W GRAM STAIN, RFLX TO RESP C

## 2017-03-12 LAB — EXPECTORATED SPUTUM ASSESSMENT W REFEX TO RESP CULTURE

## 2017-03-12 MED ORDER — RESOURCE THICKENUP CLEAR PO POWD
ORAL | Status: DC | PRN
  Filled 2017-03-12 (×2): qty 125
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
## 2017-03-12 NOTE — Progress Notes (Deleted)
Cardiology Office Note    Date:  03/12/2017   ID:  Devin Barton, DOB 12/17/1945, MRN [id number]  PCP:  [name], John, MD  Cardiologist: Dr. Wyline MoodBranch   No chief complaint on file.   History of Present Illness:    Devin Barton is a 71 y.o. male with past medical history of CAD (s/p CABG in 09/2016 with LIMA-LAD, SVG-OM1, and seq SVG-Mrg-PDA), chronic systolic CHF (EF 14-78%25-30% by echo in 08/2016),post-operative atrial fibrillation, HTN, HLD, carotid artery stenosis and COPD who presents to the office today for 2 month follow-up.   He was last examined by Dr. Wyline MoodBranch in 01/2017 and reported having stable dyspnea on exertion but denied any changes in his recent symptoms are associated chest discomfort.  A repeat echocardiogram was arranged and performed on 02/11/2017 which showed the EF remains reduced at 30-35% with grade 1 diastolic dysfunction.  Close follow-up was arranged for further titration of his cardiac medications.   Past Medical History:  Diagnosis Date  . Asthma   . CAD (coronary artery disease) cardiologist-- dr j. 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  . Chronic systolic CHF (congestive heart failure) (HCC)    ef 35-40% per TEE 7/ 2018, echo ef 25-30% 06/ 2018  . Cigarette smoker   . Dysphasia    trouble swallowing after  open heart  . Dysrhythmia    post op a fib  . GERD (gastroesophageal reflux disease)   . On home oxygen therapy   . Pneumonia   . Stage 3 severe COPD by GOLD classification [facility](HCC)    previously montiored by pulmologist -- dr wert (last visit 2009) currently folllowed by pcp    Past Surgical History:  Procedure Laterality Date  . ABDOMINAL HERNIA REPAIR    . BOWEL RESECTION     Bowel perf secondary to trauma  . CARDIAC CATHETERIZATION    . CORONARY ARTERY BYPASS GRAFT N/A 10/05/2016   Procedure: CORONARY ARTERY BYPASS GRAFTING (CABG) x4 with FREE MAMMARY.  ENDOSCOPIC HARVESTING OF RIGHT SAPHENOUS VEIN. (FREE LIMA to LAD, SVG to OM, SVG  SEQUENTIALLY to ACUTE MARGINAL and PDA);  Surgeon: Loreli SlotHendrickson, Steven C, MD;  [facility];  Service: Open Heart Surgery;  Laterality: N/A;  . CYSTOSCOPY WITH INSERTION OF UROLIFT N/A 01/11/2017   Procedure: CYSTOSCOPY WITH INSERTION OF UROLIFT;  Surgeon: Malen GauzeMcKenzie, Patrick L, MD;  Location: [facility] ORS;  Service: Urology;  Laterality: N/A;  . EXPLORATORY LAPAROTOMY  1986   [name]/notes 08/08/2010  . FOOT FRACTURE SURGERY Left ~ 1965  . FRACTURE SURGERY    . HERNIA REPAIR    . INCISIONAL HERNIA REPAIR  05/2005   [name]/notes 08/08/2010  . INGUINAL HERNIA REPAIR Bilateral   . INGUINAL HERNIA REPAIR Right 09/2006   recurrent/notes 07/27/2010  . ORIF FOOT FRACTURE Left 1990   [name]/notes 08/08/2010  . RIGHT/LEFT HEART CATH AND CORONARY ANGIOGRAPHY N/A 10/04/2016   Procedure: Right/Left Heart Cath and Coronary Angiography;  Surgeon: Corky CraftsVaranasi, Jayadeep S, MD;  [facility];  Service: Cardiovascular;  Laterality: N/A;  . TEE WITHOUT CARDIOVERSION N/A 10/05/2016   Procedure: TRANSESOPHAGEAL ECHOCARDIOGRAM (TEE);  Surgeon: Loreli SlotHendrickson, Steven C, MD;  [facility];  Service: Open Heart Surgery;  Laterality: N/A;  . VENTRAL HERNIA REPAIR  09/2006   recurrent/notes 07/27/2010    Current Medications: Outpatient Medications Prior to Visit  Medication Sig Dispense Refill  . acetaminophen (TYLENOL) 500 MG tablet Take 1,000 mg by mouth every 6 (six) hours as needed for  moderate pain or headache.     Marland Kitchen. amoxicillin (AMOXIL) 500 MG capsule     . aspirin EC 325 MG EC tablet Take 1 tablet (325 mg total) by mouth daily. 30 tablet 0  . atorvastatin (LIPITOR) 40 MG tablet Take 1 tablet (40 mg total) by mouth daily at 6 PM. 30 tablet 3  . budesonide (PULMICORT) 0.25 MG/2ML nebulizer solution Take 2 mLs (0.25 mg total) by nebulization 2 (two) times daily. 60 mL 12  . feeding supplement, ENSURE ENLIVE, (ENSURE ENLIVE) LIQD Take 237 mLs by mouth 2 (two) times daily between meals. 237 mL 12  . finasteride (PROSCAR) 5 MG  tablet Take 5 mg by mouth daily.    . furosemide (LASIX) 40 MG tablet TAKE 40 MG DAILY. mAY TAKE AN ADDITIONAL 40 MG FOR WEIGHT GAIN OR SWELLING. (Patient taking differently: Take 20-60 mg by mouth See admin instructions. Take 60 mg by mouth in the morning and take 20 mg by mouth in the afternoon) 60 tablet 3  . guaiFENesin (MUCINEX) 600 MG 12 hr tablet Take 600 mg by mouth daily as needed for cough or to loosen phlegm.     Marland Kitchen. ipratropium (ATROVENT) 0.02 % nebulizer solution Take 0.5 mg by nebulization daily as needed for wheezing or shortness of breath.     Marland Kitchen. KLOR-CON M20 20 MEQ tablet TAKE 1 TABLET BY MOUTH ONCE DAILY 30 tablet 3  . lisinopril (PRINIVIL,ZESTRIL) 2.5 MG tablet Take 1 tablet (2.5 mg total) by mouth daily. 90 tablet 3  . metoprolol succinate (TOPROL-XL) 25 MG 24 hr tablet Take 12.5 mg daily by mouth.    Marland Kitchen. omeprazole (PRILOSEC) 40 MG capsule Take 40 mg by mouth daily.    . Phenazopyridine HCl (AZO-STANDARD PO) Take by mouth.    . tamsulosin (FLOMAX) 0.4 MG CAPS capsule      No facility-administered medications prior to visit.      Allergies:   Patient has no known allergies.   Social History   Socioeconomic History  . Marital status: Married    Spouse name: Not on file  . Number of children: 2  . Years of education: Not on file  . Highest education level: Not on file  Social Needs  . Financial resource strain: Not on file  . Food insecurity - worry: Not on file  . Food insecurity - inability: Not on file  . Transportation needs - medical: Not on file  . Transportation needs - non-medical: Not on file  Occupational History  . Not on file  Tobacco Use  . Smoking status: Former Smoker    Packs/day: 1.00    Years: 55.00    Pack years: 55.00    Types: Cigarettes  . Smokeless tobacco: Never Used  . Tobacco comment: quit 3 months as of 01/08/17  Substance and Sexual Activity  . Alcohol use: No  . Drug use: No  . Sexual activity: Not Currently  Other Topics Concern    . Not on file  Social History Narrative   Lives at home with wife.      Family History:  The patient's ***family history includes Alzheimer's disease in his mother; Heart disease in his mother.   Review of Systems:   Please see the history of present illness.     General:  No chills, fever, night sweats or weight changes.  Cardiovascular:  No chest pain, dyspnea on exertion, edema, orthopnea, palpitations, paroxysmal nocturnal dyspnea. Dermatological: No rash, lesions/masses Respiratory: No cough, dyspnea Urologic: No  hematuria, dysuria Abdominal:   No nausea, vomiting, diarrhea, bright red blood per rectum, melena, or hematemesis Neurologic:  No visual changes, wkns, changes in mental status. All other systems reviewed and are otherwise negative except as noted above.   Physical Exam:    VS:  There were no vitals taken for this visit.   General: Well developed, well nourished,male appearing in no acute distress. Head: Normocephalic, atraumatic, sclera non-icteric, no xanthomas, nares are without discharge.  Neck: No carotid bruits. JVD not elevated.  Lungs: Respirations regular and unlabored, without wheezes or rales.  Heart: ***Regular rate and rhythm. No S3 or S4.  No murmur, no rubs, or gallops appreciated. Abdomen: Soft, non-tender, non-distended with normoactive bowel sounds. No hepatomegaly. No rebound/guarding. No obvious abdominal masses. Msk:  Strength and tone appear normal for age. No joint deformities or effusions. Extremities: No clubbing or cyanosis. No edema.  Distal pedal pulses are 2+ bilaterally. Neuro: Alert and oriented X 3. Moves all extremities spontaneously. No focal deficits noted. Psych:  Responds to questions appropriately with a normal affect. Skin: No rashes or lesions noted  Wt Readings from Last 3 Encounters:  01/29/17 122 lb 12.8 oz (55.7 kg)  01/11/17 121 lb (54.9 kg)  01/08/17 121 lb (54.9 kg)        Studies/Labs Reviewed:   EKG:  EKG  is*** ordered today.  The ekg ordered today demonstrates ***  Recent Labs: 10/15/2016: B Natriuretic Peptide 812.8 10/18/2016: Magnesium 2.2; TSH 3.666 12/13/2016: BUN 20; Creatinine, Ser 0.87; Potassium 4.8; Sodium 131 12/31/2016: ALT 18; Hemoglobin 11.2; Platelets 551   Lipid Panel No results found for: CHOL, TRIG, HDL, CHOLHDL, VLDL, LDLCALC, LDLDIRECT  Additional studies/ records that were reviewed today include:  ***  Assessment:    No diagnosis found.   Plan:   In order of problems listed above:  1. ***    Medication Adjustments/Labs and Tests Ordered: Current medicines are reviewed at length with the patient today.  Concerns regarding medicines are outlined above.  Medication changes, Labs and Tests ordered today are listed in the Patient Instructions below. There are no Patient Instructions on file for this visit.   Signed, Ellsworth Lennox, PA-C  03/12/2017 12:50 PM    [facility] [address] [place], [address] [place], [place]  [address] Phone: [phone]; Fax: [phone]  [address], [address] [place], [place] [address] Phone: [phone]

## 2017-03-12 NOTE — Progress Notes (Signed)
Report given to Ginger on 4NP, patient to have MBS then go to 4NP06. Family and pt aware. All belon gings are with family. Patient VSS.

## 2017-03-12 NOTE — Plan of Care (Signed)
  Progressing Education: Knowledge of General Education information will improve 03/12/2017 1236 - Progressing by Quentin CornwallMadison, Skyy Nilan, RN Health Behavior/Discharge Planning: Ability to manage health-related needs will improve 03/12/2017 1236 - Progressing by Quentin CornwallMadison, Slater Mcmanaman, RN Clinical Measurements: Ability to maintain clinical measurements within normal limits will improve 03/12/2017 1236 - Progressing by Quentin CornwallMadison, Adriona Kaney, RN Will remain free from infection 03/12/2017 1236 - Progressing by Quentin CornwallMadison, Dee Maday, RN Diagnostic test results will improve 03/12/2017 1236 - Progressing by Quentin CornwallMadison, Kishana Battey, RN Respiratory complications will improve 03/12/2017 1236 - Progressing by Quentin CornwallMadison, Shine Scrogham, RN Cardiovascular complication will be avoided 03/12/2017 1236 - Progressing by Quentin CornwallMadison, Eduin Friedel, RN Activity: Risk for activity intolerance will decrease 03/12/2017 1236 - Progressing by Quentin CornwallMadison, Lamarco Gudiel, RN Nutrition: Adequate nutrition will be maintained 03/12/2017 1236 - Progressing by Quentin CornwallMadison, Sneha Willig, RN Coping: Level of anxiety will decrease 03/12/2017 1236 - Progressing by Quentin CornwallMadison, Nivaan Dicenzo, RN Elimination: Will not experience complications related to bowel motility 03/12/2017 1236 - Progressing by Quentin CornwallMadison, Loyce Klasen, RN Will not experience complications related to urinary retention 03/12/2017 1236 - Progressing by Quentin CornwallMadison, Tracie Dore, RN Pain Managment: General experience of comfort will improve 03/12/2017 1236 - Progressing by Quentin CornwallMadison, Colene Mines, RN Safety: Ability to remain free from injury will improve 03/12/2017 1236 - Progressing by Quentin CornwallMadison, Sreenidhi Ganson, RN Skin Integrity: Risk for impaired skin integrity will decrease 03/12/2017 1236 - Progressing by Quentin CornwallMadison, Atthew Coutant, RN

## 2017-03-12 NOTE — Progress Notes (Signed)
Modified Barium Swallow Progress Note  Patient Details  Name: Devin Barton MRN: [id number] Date of Birth: Jun 23, 1945  Today's Date: 03/12/2017  Modified Barium Swallow completed.  Full report located under Chart Review in the Imaging Section.  Brief recommendations include the following:  Clinical Impression  Pt demonstrates a moderate oropharyngeal dysphagia with suspected baseline deficits including appearance of left sided weakness of facial nerve and middle pharyngeal constrictors with possible increased weakness due to acute illness.  Mastication and bolus formation requires slight head tilt right and extra time to form bolus.  Pt observed to have timely swallow but moderate residuals on posterior and lateral pharyngeal wall at the level of the epiglottis to the pyrforms. With thin liquids, this residue easily spills into the airway post swallow, but with nectar pt utilizes a second swallow quickly enough to clear prior to penetration. Aspiration is consistently sensed. Pt independently used a head turn right during study, which is apparently his baseline habit. Adding a chin tuck had no added benefit. Given acute decompensation follow trauma recommend a dys 2/nectar thick diet to reduce risk of aspiration as pt recovers. Improved cough strength would be beneficial to transition back to thin eventually.    Swallow Evaluation Recommendations       SLP Diet Recommendations: Dysphagia 2 (Fine chop) solids;Nectar thick liquid   Liquid Administration via: Cup;Straw   Medication Administration: Whole meds with puree   Supervision: Intermittent supervision to cue for compensatory strategies   Compensations: Slow rate;Small sips/bites(head turn right, but not necessary)   Postural Changes: Remain semi-upright after after feeds/meals (Comment);Seated upright at 90 degrees   Oral Care Recommendations: Oral care BID   Other Recommendations: Order thickener from pharmacy;Have oral suction  available  Harlon DittyBonnie Hussien Greenblatt, MA CCC-SLP [phone]   Mry Lamia, Devin Barton 03/12/2017,10:46 AM
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
## 2017-03-12 NOTE — Progress Notes (Signed)
Pt transferred from 4N ICU to 4NP room 6 after modified barium swallow. Pt switched to dysphagia 2 diet with nectar thickened liquids. Pt tolerated his pills in yogurt and demonstrated proper swallowing techniques, no coughing or signs of aspiration noted. Pt oriented to new room as well as family at bedside. Oral suction set up at bedside per patient request. VSS. Call bell within reach.
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
## 2017-03-13 ENCOUNTER — Encounter (HOSPITAL_COMMUNITY): Payer: Self-pay | Admitting: General Practice

## 2017-03-13 ENCOUNTER — Ambulatory Visit: Payer: Medicare HMO | Admitting: Student

## 2017-03-13 ENCOUNTER — Other Ambulatory Visit: Payer: Self-pay

## 2017-03-13 ENCOUNTER — Inpatient Hospital Stay (HOSPITAL_COMMUNITY): Payer: Medicare HMO

## 2017-03-13 LAB — CBC
HCT: 29.6 % — ABNORMAL LOW (ref 39.0–52.0)
HEMOGLOBIN: 9.5 g/dL — AB (ref 13.0–17.0)
MCH: 30.7 pg (ref 26.0–34.0)
MCHC: 32.1 g/dL (ref 30.0–36.0)
MCV: 95.8 fL (ref 78.0–100.0)
Platelets: 385 10*3/uL (ref 150–400)
RBC: 3.09 MIL/uL — AB (ref 4.22–5.81)
RDW: 15.3 % (ref 11.5–15.5)
WBC: 11.9 10*3/uL — AB (ref 4.0–10.5)

## 2017-03-13 LAB — BASIC METABOLIC PANEL
Anion gap: 10 (ref 5–15)
BUN: 17 mg/dL (ref 6–20)
CHLORIDE: 106 mmol/L (ref 101–111)
CO2: 24 mmol/L (ref 22–32)
CREATININE: 0.66 mg/dL (ref 0.61–1.24)
Calcium: 8.6 mg/dL — ABNORMAL LOW (ref 8.9–10.3)
GFR calc non Af Amer: >60 mL/min (ref >60)
Glucose, Bld: 103 mg/dL — ABNORMAL HIGH (ref 65–99)
POTASSIUM: 3.8 mmol/L (ref 3.5–5.1)
SODIUM: 140 mmol/L (ref 135–145)

## 2017-03-13 MED ORDER — OXYCODONE HCL 5 MG PO TABS
5.0000 mg | ORAL_TABLET | ORAL | Status: DC | PRN
  Administered 2017-03-13 – 2017-03-14 (×3): 5 mg via ORAL
  Filled 2017-03-13 (×3): qty 1

## 2017-03-13 MED ORDER — ACETAMINOPHEN 325 MG PO TABS
650.0000 mg | ORAL_TABLET | Freq: Four times a day (QID) | ORAL | Status: DC | PRN
  Administered 2017-03-13 – 2017-03-14 (×2): 650 mg via ORAL
  Filled 2017-03-13 (×2): qty 2

## 2017-03-13 MED ORDER — GUAIFENESIN ER 600 MG PO TB12
600.0000 mg | ORAL_TABLET | Freq: Two times a day (BID) | ORAL | Status: DC
  Administered 2017-03-13 – 2017-03-14 (×3): 600 mg via ORAL
  Filled 2017-03-13 (×3): qty 1

## 2017-03-13 MED ORDER — PANTOPRAZOLE SODIUM 40 MG PO TBEC
40.0000 mg | DELAYED_RELEASE_TABLET | Freq: Every day | ORAL | Status: DC
  Administered 2017-03-13 – 2017-03-14 (×2): 40 mg via ORAL
  Filled 2017-03-13 (×2): qty 1

## 2017-03-13 MED ORDER — HYDROMORPHONE HCL 1 MG/ML IJ SOLN
0.5000 mg | INTRAMUSCULAR | Status: DC | PRN
Start: 2017-03-13 — End: 2017-03-14
  Administered 2017-03-13: 0.5 mg via INTRAVENOUS
  Filled 2017-03-13: qty 0.5
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
## 2017-03-13 NOTE — Discharge Instructions (Signed)
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
## 2017-03-14 LAB — CULTURE, BLOOD (ROUTINE X 2)
Culture: NO GROWTH
Culture: NO GROWTH
SPECIAL REQUESTS: ADEQUATE
Special Requests: ADEQUATE

## 2017-03-14 MED ORDER — OXYCODONE HCL 5 MG PO TABS: 5.0000 mg | ORAL_TABLET | ORAL | 0 refills | Status: DC | PRN
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
## 2017-03-15 ENCOUNTER — Telehealth: Payer: Self-pay | Admitting: Cardiology

## 2017-03-15 ENCOUNTER — Encounter: Payer: Self-pay | Admitting: Cardiology

## 2017-03-15 DIAGNOSIS — S2243XD Multiple fractures of ribs, bilateral, subsequent encounter for fracture with routine healing: Secondary | ICD-10-CM | POA: Diagnosis not present

## 2017-03-15 DIAGNOSIS — Z7982 Long term (current) use of aspirin: Secondary | ICD-10-CM | POA: Diagnosis not present

## 2017-03-15 DIAGNOSIS — Z7951 Long term (current) use of inhaled steroids: Secondary | ICD-10-CM | POA: Diagnosis not present

## 2017-03-15 DIAGNOSIS — J449 Chronic obstructive pulmonary disease, unspecified: Secondary | ICD-10-CM | POA: Diagnosis not present

## 2017-03-15 DIAGNOSIS — S301XXD Contusion of abdominal wall, subsequent encounter: Secondary | ICD-10-CM | POA: Diagnosis not present

## 2017-03-15 DIAGNOSIS — I509 Heart failure, unspecified: Secondary | ICD-10-CM | POA: Diagnosis not present

## 2017-03-15 DIAGNOSIS — S022XXD Fracture of nasal bones, subsequent encounter for fracture with routine healing: Secondary | ICD-10-CM | POA: Diagnosis not present

## 2017-03-15 DIAGNOSIS — I2510 Atherosclerotic heart disease of native coronary artery without angina pectoris: Secondary | ICD-10-CM | POA: Diagnosis not present

## 2017-03-15 DIAGNOSIS — S0181XD Laceration without foreign body of other part of head, subsequent encounter: Secondary | ICD-10-CM | POA: Diagnosis not present

## 2017-03-15 LAB — CULTURE, RESPIRATORY W GRAM STAIN

## 2017-03-15 LAB — CULTURE, RESPIRATORY

## 2017-03-15 NOTE — Telephone Encounter (Signed)
Please give Byrd HesselbachMaria a call concerning pt's metoprolol succinate (TOPROL-XL) 25 MG 24 hr tablet [[id number]]   @ [phone]

## 2017-03-15 NOTE — Telephone Encounter (Signed)
HHN Devin Barton called to clarify Toprol XL dose

## 2017-03-17 DIAGNOSIS — R69 Illness, unspecified: Secondary | ICD-10-CM | POA: Diagnosis not present

## 2017-03-18 ENCOUNTER — Telehealth: Payer: Self-pay | Admitting: Cardiology

## 2017-03-18 DIAGNOSIS — J449 Chronic obstructive pulmonary disease, unspecified: Secondary | ICD-10-CM | POA: Diagnosis not present

## 2017-03-18 DIAGNOSIS — S022XXD Fracture of nasal bones, subsequent encounter for fracture with routine healing: Secondary | ICD-10-CM | POA: Diagnosis not present

## 2017-03-18 DIAGNOSIS — I2510 Atherosclerotic heart disease of native coronary artery without angina pectoris: Secondary | ICD-10-CM | POA: Diagnosis not present

## 2017-03-18 DIAGNOSIS — Z7982 Long term (current) use of aspirin: Secondary | ICD-10-CM | POA: Diagnosis not present

## 2017-03-18 DIAGNOSIS — S2243XD Multiple fractures of ribs, bilateral, subsequent encounter for fracture with routine healing: Secondary | ICD-10-CM | POA: Diagnosis not present

## 2017-03-18 DIAGNOSIS — S0181XD Laceration without foreign body of other part of head, subsequent encounter: Secondary | ICD-10-CM | POA: Diagnosis not present

## 2017-03-18 DIAGNOSIS — S301XXD Contusion of abdominal wall, subsequent encounter: Secondary | ICD-10-CM | POA: Diagnosis not present

## 2017-03-18 DIAGNOSIS — Z7951 Long term (current) use of inhaled steroids: Secondary | ICD-10-CM | POA: Diagnosis not present

## 2017-03-18 DIAGNOSIS — I509 Heart failure, unspecified: Secondary | ICD-10-CM | POA: Diagnosis not present

## 2017-03-18 NOTE — Telephone Encounter (Signed)
Wife advised that its okay for the temporary dose decrease of 81 mg aspirin due to the bleeding. Wife advised to consult with his ENT when he goes on Friday this week if its safe to increase back to 325 mg aspirin. Wife verbalized understanding of plan.

## 2017-03-18 NOTE — Telephone Encounter (Signed)
Has a question concerning his medication--they changed his Aspirin when he was in the hospital [facility] and [facility] wife would like to know if that's ok w/ Dr. Wyline MoodBranch, please give her a call @ [phone]

## 2017-03-20 DIAGNOSIS — J449 Chronic obstructive pulmonary disease, unspecified: Secondary | ICD-10-CM | POA: Diagnosis not present

## 2017-03-20 DIAGNOSIS — I5021 Acute systolic (congestive) heart failure: Secondary | ICD-10-CM | POA: Diagnosis not present

## 2017-03-20 DIAGNOSIS — Z7982 Long term (current) use of aspirin: Secondary | ICD-10-CM | POA: Diagnosis not present

## 2017-03-20 DIAGNOSIS — S0181XD Laceration without foreign body of other part of head, subsequent encounter: Secondary | ICD-10-CM | POA: Diagnosis not present

## 2017-03-20 DIAGNOSIS — T79A11A Traumatic compartment syndrome of right upper extremity, initial encounter: Secondary | ICD-10-CM | POA: Diagnosis not present

## 2017-03-20 DIAGNOSIS — I2510 Atherosclerotic heart disease of native coronary artery without angina pectoris: Secondary | ICD-10-CM | POA: Diagnosis not present

## 2017-03-20 DIAGNOSIS — I509 Heart failure, unspecified: Secondary | ICD-10-CM | POA: Diagnosis not present

## 2017-03-20 DIAGNOSIS — S2243XD Multiple fractures of ribs, bilateral, subsequent encounter for fracture with routine healing: Secondary | ICD-10-CM | POA: Diagnosis not present

## 2017-03-20 DIAGNOSIS — S301XXD Contusion of abdominal wall, subsequent encounter: Secondary | ICD-10-CM | POA: Diagnosis not present

## 2017-03-20 DIAGNOSIS — Z7951 Long term (current) use of inhaled steroids: Secondary | ICD-10-CM | POA: Diagnosis not present

## 2017-03-20 DIAGNOSIS — S022XXD Fracture of nasal bones, subsequent encounter for fracture with routine healing: Secondary | ICD-10-CM | POA: Diagnosis not present

## 2017-03-20 DIAGNOSIS — R269 Unspecified abnormalities of gait and mobility: Secondary | ICD-10-CM | POA: Diagnosis not present

## 2017-03-21 DIAGNOSIS — I509 Heart failure, unspecified: Secondary | ICD-10-CM | POA: Diagnosis not present

## 2017-03-21 DIAGNOSIS — S0181XD Laceration without foreign body of other part of head, subsequent encounter: Secondary | ICD-10-CM | POA: Diagnosis not present

## 2017-03-21 DIAGNOSIS — S301XXD Contusion of abdominal wall, subsequent encounter: Secondary | ICD-10-CM | POA: Diagnosis not present

## 2017-03-21 DIAGNOSIS — J449 Chronic obstructive pulmonary disease, unspecified: Secondary | ICD-10-CM | POA: Diagnosis not present

## 2017-03-21 DIAGNOSIS — S022XXD Fracture of nasal bones, subsequent encounter for fracture with routine healing: Secondary | ICD-10-CM | POA: Diagnosis not present

## 2017-03-21 DIAGNOSIS — I2510 Atherosclerotic heart disease of native coronary artery without angina pectoris: Secondary | ICD-10-CM | POA: Diagnosis not present

## 2017-03-21 DIAGNOSIS — Z7982 Long term (current) use of aspirin: Secondary | ICD-10-CM | POA: Diagnosis not present

## 2017-03-21 DIAGNOSIS — Z7951 Long term (current) use of inhaled steroids: Secondary | ICD-10-CM | POA: Diagnosis not present

## 2017-03-21 DIAGNOSIS — S2243XD Multiple fractures of ribs, bilateral, subsequent encounter for fracture with routine healing: Secondary | ICD-10-CM | POA: Diagnosis not present

## 2017-03-22 DIAGNOSIS — Z9981 Dependence on supplemental oxygen: Secondary | ICD-10-CM | POA: Diagnosis not present

## 2017-03-22 DIAGNOSIS — S022XXD Fracture of nasal bones, subsequent encounter for fracture with routine healing: Secondary | ICD-10-CM | POA: Diagnosis not present

## 2017-03-22 DIAGNOSIS — J439 Emphysema, unspecified: Secondary | ICD-10-CM | POA: Diagnosis not present

## 2017-03-25 DIAGNOSIS — I509 Heart failure, unspecified: Secondary | ICD-10-CM | POA: Diagnosis not present

## 2017-03-25 DIAGNOSIS — S022XXD Fracture of nasal bones, subsequent encounter for fracture with routine healing: Secondary | ICD-10-CM | POA: Diagnosis not present

## 2017-03-25 DIAGNOSIS — S2243XD Multiple fractures of ribs, bilateral, subsequent encounter for fracture with routine healing: Secondary | ICD-10-CM | POA: Diagnosis not present

## 2017-03-25 DIAGNOSIS — S301XXD Contusion of abdominal wall, subsequent encounter: Secondary | ICD-10-CM | POA: Diagnosis not present

## 2017-03-25 DIAGNOSIS — Z7982 Long term (current) use of aspirin: Secondary | ICD-10-CM | POA: Diagnosis not present

## 2017-03-25 DIAGNOSIS — Z7951 Long term (current) use of inhaled steroids: Secondary | ICD-10-CM | POA: Diagnosis not present

## 2017-03-25 DIAGNOSIS — S0181XD Laceration without foreign body of other part of head, subsequent encounter: Secondary | ICD-10-CM | POA: Diagnosis not present

## 2017-03-25 DIAGNOSIS — I2510 Atherosclerotic heart disease of native coronary artery without angina pectoris: Secondary | ICD-10-CM | POA: Diagnosis not present

## 2017-03-25 DIAGNOSIS — J449 Chronic obstructive pulmonary disease, unspecified: Secondary | ICD-10-CM | POA: Diagnosis not present

## 2017-03-26 ENCOUNTER — Encounter (HOSPITAL_COMMUNITY): Payer: Self-pay | Admitting: Emergency Medicine

## 2017-03-26 ENCOUNTER — Emergency Department (HOSPITAL_COMMUNITY): Payer: Medicare HMO

## 2017-03-26 ENCOUNTER — Other Ambulatory Visit: Payer: Self-pay

## 2017-03-26 ENCOUNTER — Emergency Department (HOSPITAL_COMMUNITY)
Admission: EM | Admit: 2017-03-26 | Discharge: 2017-03-26 | Disposition: A | Discharge status: Home / Self Care | Payer: Medicare HMO | Attending: Emergency Medicine | Admitting: Emergency Medicine

## 2017-03-26 DIAGNOSIS — Z7951 Long term (current) use of inhaled steroids: Secondary | ICD-10-CM | POA: Diagnosis not present

## 2017-03-26 DIAGNOSIS — Z7982 Long term (current) use of aspirin: Secondary | ICD-10-CM | POA: Diagnosis not present

## 2017-03-26 DIAGNOSIS — S022XXD Fracture of nasal bones, subsequent encounter for fracture with routine healing: Secondary | ICD-10-CM | POA: Diagnosis not present

## 2017-03-26 DIAGNOSIS — J45909 Unspecified asthma, uncomplicated: Secondary | ICD-10-CM | POA: Diagnosis not present

## 2017-03-26 DIAGNOSIS — I509 Heart failure, unspecified: Secondary | ICD-10-CM | POA: Diagnosis not present

## 2017-03-26 DIAGNOSIS — I2510 Atherosclerotic heart disease of native coronary artery without angina pectoris: Secondary | ICD-10-CM | POA: Diagnosis not present

## 2017-03-26 DIAGNOSIS — N390 Urinary tract infection, site not specified: Secondary | ICD-10-CM | POA: Diagnosis not present

## 2017-03-26 DIAGNOSIS — Z79899 Other long term (current) drug therapy: Secondary | ICD-10-CM | POA: Insufficient documentation

## 2017-03-26 DIAGNOSIS — J189 Pneumonia, unspecified organism: Secondary | ICD-10-CM | POA: Diagnosis not present

## 2017-03-26 DIAGNOSIS — I110 Hypertensive heart disease with heart failure: Secondary | ICD-10-CM | POA: Insufficient documentation

## 2017-03-26 DIAGNOSIS — Z951 Presence of aortocoronary bypass graft: Secondary | ICD-10-CM | POA: Insufficient documentation

## 2017-03-26 DIAGNOSIS — M545 Low back pain, unspecified: Secondary | ICD-10-CM

## 2017-03-26 DIAGNOSIS — S301XXD Contusion of abdominal wall, subsequent encounter: Secondary | ICD-10-CM | POA: Diagnosis not present

## 2017-03-26 DIAGNOSIS — I5022 Chronic systolic (congestive) heart failure: Secondary | ICD-10-CM | POA: Diagnosis not present

## 2017-03-26 DIAGNOSIS — S2243XD Multiple fractures of ribs, bilateral, subsequent encounter for fracture with routine healing: Secondary | ICD-10-CM | POA: Diagnosis not present

## 2017-03-26 DIAGNOSIS — Z87891 Personal history of nicotine dependence: Secondary | ICD-10-CM | POA: Insufficient documentation

## 2017-03-26 DIAGNOSIS — J449 Chronic obstructive pulmonary disease, unspecified: Secondary | ICD-10-CM | POA: Insufficient documentation

## 2017-03-26 DIAGNOSIS — S0181XD Laceration without foreign body of other part of head, subsequent encounter: Secondary | ICD-10-CM | POA: Diagnosis not present

## 2017-03-26 LAB — URINALYSIS, ROUTINE W REFLEX MICROSCOPIC
Bilirubin Urine: NEGATIVE
Glucose, UA: NEGATIVE mg/dL
Hgb urine dipstick: NEGATIVE
Ketones, ur: NEGATIVE mg/dL
Nitrite: NEGATIVE
Protein, ur: NEGATIVE mg/dL
Specific Gravity, Urine: 1.017 (ref 1.005–1.030)
Squamous Epithelial / LPF: NONE SEEN
pH: 5 (ref 5.0–8.0)

## 2017-03-26 MED ORDER — CEPHALEXIN 500 MG PO CAPS: 500.0000 mg | ORAL_CAPSULE | Freq: Three times a day (TID) | ORAL | 0 refills | Status: DC

## 2017-03-26 MED ORDER — OXYCODONE HCL 5 MG PO TABS: 5.0000 mg | ORAL_TABLET | Freq: Four times a day (QID) | ORAL | 0 refills | Status: DC | PRN

## 2017-03-26 NOTE — Discharge Instructions (Signed)
Xray of your back today did not show any broken bone.  Take percocet as needed for pain.  Your urine shows sign of infection, take antibiotic as prescribed for the full duration.  Follow up with your doctor for further care.
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
## 2017-03-27 DIAGNOSIS — I509 Heart failure, unspecified: Secondary | ICD-10-CM | POA: Diagnosis not present

## 2017-03-27 DIAGNOSIS — S2239XA Fracture of one rib, unspecified side, initial encounter for closed fracture: Secondary | ICD-10-CM | POA: Diagnosis not present

## 2017-03-27 DIAGNOSIS — S022XXD Fracture of nasal bones, subsequent encounter for fracture with routine healing: Secondary | ICD-10-CM | POA: Diagnosis not present

## 2017-03-27 DIAGNOSIS — Z7951 Long term (current) use of inhaled steroids: Secondary | ICD-10-CM | POA: Diagnosis not present

## 2017-03-27 DIAGNOSIS — S0181XD Laceration without foreign body of other part of head, subsequent encounter: Secondary | ICD-10-CM | POA: Diagnosis not present

## 2017-03-27 DIAGNOSIS — S301XXD Contusion of abdominal wall, subsequent encounter: Secondary | ICD-10-CM | POA: Diagnosis not present

## 2017-03-27 DIAGNOSIS — I2510 Atherosclerotic heart disease of native coronary artery without angina pectoris: Secondary | ICD-10-CM | POA: Diagnosis not present

## 2017-03-27 DIAGNOSIS — J449 Chronic obstructive pulmonary disease, unspecified: Secondary | ICD-10-CM | POA: Diagnosis not present

## 2017-03-27 DIAGNOSIS — Z7982 Long term (current) use of aspirin: Secondary | ICD-10-CM | POA: Diagnosis not present

## 2017-03-27 DIAGNOSIS — S2243XD Multiple fractures of ribs, bilateral, subsequent encounter for fracture with routine healing: Secondary | ICD-10-CM | POA: Diagnosis not present

## 2017-03-28 DIAGNOSIS — S0181XD Laceration without foreign body of other part of head, subsequent encounter: Secondary | ICD-10-CM | POA: Diagnosis not present

## 2017-03-28 DIAGNOSIS — S301XXD Contusion of abdominal wall, subsequent encounter: Secondary | ICD-10-CM | POA: Diagnosis not present

## 2017-03-28 DIAGNOSIS — S022XXD Fracture of nasal bones, subsequent encounter for fracture with routine healing: Secondary | ICD-10-CM | POA: Diagnosis not present

## 2017-03-28 DIAGNOSIS — Z7982 Long term (current) use of aspirin: Secondary | ICD-10-CM | POA: Diagnosis not present

## 2017-03-28 DIAGNOSIS — Z7951 Long term (current) use of inhaled steroids: Secondary | ICD-10-CM | POA: Diagnosis not present

## 2017-03-28 DIAGNOSIS — I2510 Atherosclerotic heart disease of native coronary artery without angina pectoris: Secondary | ICD-10-CM | POA: Diagnosis not present

## 2017-03-28 DIAGNOSIS — S2243XD Multiple fractures of ribs, bilateral, subsequent encounter for fracture with routine healing: Secondary | ICD-10-CM | POA: Diagnosis not present

## 2017-03-28 DIAGNOSIS — J441 Chronic obstructive pulmonary disease with (acute) exacerbation: Secondary | ICD-10-CM | POA: Diagnosis not present

## 2017-03-28 DIAGNOSIS — J449 Chronic obstructive pulmonary disease, unspecified: Secondary | ICD-10-CM | POA: Diagnosis not present

## 2017-03-28 DIAGNOSIS — I509 Heart failure, unspecified: Secondary | ICD-10-CM | POA: Diagnosis not present

## 2017-03-29 DIAGNOSIS — S0181XD Laceration without foreign body of other part of head, subsequent encounter: Secondary | ICD-10-CM | POA: Diagnosis not present

## 2017-03-29 DIAGNOSIS — J449 Chronic obstructive pulmonary disease, unspecified: Secondary | ICD-10-CM | POA: Diagnosis not present

## 2017-03-29 DIAGNOSIS — Z7951 Long term (current) use of inhaled steroids: Secondary | ICD-10-CM | POA: Diagnosis not present

## 2017-03-29 DIAGNOSIS — S2243XD Multiple fractures of ribs, bilateral, subsequent encounter for fracture with routine healing: Secondary | ICD-10-CM | POA: Diagnosis not present

## 2017-03-29 DIAGNOSIS — I509 Heart failure, unspecified: Secondary | ICD-10-CM | POA: Diagnosis not present

## 2017-03-29 DIAGNOSIS — Z7982 Long term (current) use of aspirin: Secondary | ICD-10-CM | POA: Diagnosis not present

## 2017-03-29 DIAGNOSIS — S022XXD Fracture of nasal bones, subsequent encounter for fracture with routine healing: Secondary | ICD-10-CM | POA: Diagnosis not present

## 2017-03-29 DIAGNOSIS — I2510 Atherosclerotic heart disease of native coronary artery without angina pectoris: Secondary | ICD-10-CM | POA: Diagnosis not present

## 2017-03-29 DIAGNOSIS — S301XXD Contusion of abdominal wall, subsequent encounter: Secondary | ICD-10-CM | POA: Diagnosis not present

## 2017-04-02 DIAGNOSIS — S022XXD Fracture of nasal bones, subsequent encounter for fracture with routine healing: Secondary | ICD-10-CM | POA: Diagnosis not present

## 2017-04-02 DIAGNOSIS — S2243XD Multiple fractures of ribs, bilateral, subsequent encounter for fracture with routine healing: Secondary | ICD-10-CM | POA: Diagnosis not present

## 2017-04-02 DIAGNOSIS — Z7982 Long term (current) use of aspirin: Secondary | ICD-10-CM | POA: Diagnosis not present

## 2017-04-02 DIAGNOSIS — Z7951 Long term (current) use of inhaled steroids: Secondary | ICD-10-CM | POA: Diagnosis not present

## 2017-04-02 DIAGNOSIS — J449 Chronic obstructive pulmonary disease, unspecified: Secondary | ICD-10-CM | POA: Diagnosis not present

## 2017-04-02 DIAGNOSIS — I2510 Atherosclerotic heart disease of native coronary artery without angina pectoris: Secondary | ICD-10-CM | POA: Diagnosis not present

## 2017-04-02 DIAGNOSIS — I509 Heart failure, unspecified: Secondary | ICD-10-CM | POA: Diagnosis not present

## 2017-04-02 DIAGNOSIS — S301XXD Contusion of abdominal wall, subsequent encounter: Secondary | ICD-10-CM | POA: Diagnosis not present

## 2017-04-02 DIAGNOSIS — S0181XD Laceration without foreign body of other part of head, subsequent encounter: Secondary | ICD-10-CM | POA: Diagnosis not present

## 2017-04-03 DIAGNOSIS — J189 Pneumonia, unspecified organism: Secondary | ICD-10-CM | POA: Diagnosis not present

## 2017-04-03 DIAGNOSIS — E441 Mild protein-calorie malnutrition: Secondary | ICD-10-CM | POA: Diagnosis not present

## 2017-04-03 DIAGNOSIS — Z1389 Encounter for screening for other disorder: Secondary | ICD-10-CM | POA: Diagnosis not present

## 2017-04-03 DIAGNOSIS — J449 Chronic obstructive pulmonary disease, unspecified: Secondary | ICD-10-CM | POA: Diagnosis not present

## 2017-04-03 DIAGNOSIS — J439 Emphysema, unspecified: Secondary | ICD-10-CM | POA: Diagnosis not present

## 2017-04-03 DIAGNOSIS — Z681 Body mass index (BMI) 19 or less, adult: Secondary | ICD-10-CM | POA: Diagnosis not present

## 2017-04-05 DIAGNOSIS — R69 Illness, unspecified: Secondary | ICD-10-CM | POA: Diagnosis not present

## 2017-04-08 DIAGNOSIS — Z7951 Long term (current) use of inhaled steroids: Secondary | ICD-10-CM | POA: Diagnosis not present

## 2017-04-08 DIAGNOSIS — S0181XD Laceration without foreign body of other part of head, subsequent encounter: Secondary | ICD-10-CM | POA: Diagnosis not present

## 2017-04-08 DIAGNOSIS — Z7982 Long term (current) use of aspirin: Secondary | ICD-10-CM | POA: Diagnosis not present

## 2017-04-08 DIAGNOSIS — I2510 Atherosclerotic heart disease of native coronary artery without angina pectoris: Secondary | ICD-10-CM | POA: Diagnosis not present

## 2017-04-08 DIAGNOSIS — S2243XD Multiple fractures of ribs, bilateral, subsequent encounter for fracture with routine healing: Secondary | ICD-10-CM | POA: Diagnosis not present

## 2017-04-08 DIAGNOSIS — J449 Chronic obstructive pulmonary disease, unspecified: Secondary | ICD-10-CM | POA: Diagnosis not present

## 2017-04-08 DIAGNOSIS — I509 Heart failure, unspecified: Secondary | ICD-10-CM | POA: Diagnosis not present

## 2017-04-08 DIAGNOSIS — S301XXD Contusion of abdominal wall, subsequent encounter: Secondary | ICD-10-CM | POA: Diagnosis not present

## 2017-04-08 DIAGNOSIS — S022XXD Fracture of nasal bones, subsequent encounter for fracture with routine healing: Secondary | ICD-10-CM | POA: Diagnosis not present

## 2017-04-14 DIAGNOSIS — I5021 Acute systolic (congestive) heart failure: Secondary | ICD-10-CM | POA: Diagnosis not present

## 2017-04-14 DIAGNOSIS — J449 Chronic obstructive pulmonary disease, unspecified: Secondary | ICD-10-CM | POA: Diagnosis not present

## 2017-04-15 ENCOUNTER — Ambulatory Visit: Payer: Medicare HMO | Admitting: Nurse Practitioner

## 2017-04-15 ENCOUNTER — Telehealth: Payer: Self-pay

## 2017-04-15 ENCOUNTER — Encounter: Payer: Self-pay | Admitting: Nurse Practitioner

## 2017-04-15 VITALS — BP 123/78 | HR 81 | Temp 97.1°F | Ht 66.0 in | Wt 119.0 lb

## 2017-04-15 DIAGNOSIS — R935 Abnormal findings on diagnostic imaging of other abdominal regions, including retroperitoneum: Secondary | ICD-10-CM | POA: Insufficient documentation

## 2017-04-15 DIAGNOSIS — R1013 Epigastric pain: Secondary | ICD-10-CM

## 2017-04-15 DIAGNOSIS — R1310 Dysphagia, unspecified: Secondary | ICD-10-CM | POA: Insufficient documentation

## 2017-04-15 DIAGNOSIS — R634 Abnormal weight loss: Secondary | ICD-10-CM
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
## 2017-04-15 NOTE — Progress Notes (Signed)
CC'D TO PCP °

## 2017-04-15 NOTE — Telephone Encounter (Signed)
Dr. Wyline MoodBranch,   This mutual patient was seen by Wynne DustEric Gill, NP today in our office.   Pt has a history of dysphagia which is worsening.  He is currently on thickened liquids and crushes his pills.  He is much at risk for pneumonia.  Minerva Areolaric would like to schedule him for an EGD and colonoscopy in the very near future with Dr. Jena Gaussourk.  Please advise of cardiac clearance to get these scheduled.  Thanks so much!  Lavena Bullionoris H Paislie Tessler, LPN
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
## 2017-04-15 NOTE — Assessment & Plan Note (Signed)
Persistent dysphasia symptoms.  Barium pill esophagram with some aspiration.  Speech language pathology has evaluated the patient and he seems a bit concerned.  He is currently on nectar thick liquids and soft foods.  They noted increased coughing episodes with solids along with his esophageal symptoms.  Having more difficulty with meats and high consistency foods.  Recommend revisit idea of EGD with possible dilation.  Of note, he is also apparently due for colonoscopy and we could attempt to accomplish these together if he is a candidate for endoscopic procedure.  We will request cardiology clearance due to CABG x3 in July 2018.  He is not currently on any blood thinners that would complicate matters.  I feel that this is shifting out of the realm of elective procedures and more into the urgent domain.  He is likely an increased risk of aspiration without improvement.  Additionally, it would allow evaluation of abnormal CT as described below.  Follow-up in 2 months.  I will discuss with Dr. Jena Gaussourk after we receive cardiology input.

## 2017-04-15 NOTE — Patient Instructions (Signed)
1. We will attempt to contact Dr. Wyline MoodBranch with cardiology and make sure it is safe to proceed with any procedures by GI. 2. I will discuss her case with Dr. Jena Gaussourk for final decisions and planning. 3. Continue your thickened diet for now. 4. Call if you have any questions or concerns.
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
## 2017-04-16 DIAGNOSIS — S022XXD Fracture of nasal bones, subsequent encounter for fracture with routine healing: Secondary | ICD-10-CM | POA: Diagnosis not present

## 2017-04-16 DIAGNOSIS — S0181XD Laceration without foreign body of other part of head, subsequent encounter: Secondary | ICD-10-CM | POA: Diagnosis not present

## 2017-04-16 DIAGNOSIS — Z7982 Long term (current) use of aspirin: Secondary | ICD-10-CM | POA: Diagnosis not present

## 2017-04-16 DIAGNOSIS — Z7951 Long term (current) use of inhaled steroids: Secondary | ICD-10-CM | POA: Diagnosis not present

## 2017-04-16 DIAGNOSIS — S301XXD Contusion of abdominal wall, subsequent encounter: Secondary | ICD-10-CM | POA: Diagnosis not present

## 2017-04-16 DIAGNOSIS — S2243XD Multiple fractures of ribs, bilateral, subsequent encounter for fracture with routine healing: Secondary | ICD-10-CM | POA: Diagnosis not present

## 2017-04-16 DIAGNOSIS — I2510 Atherosclerotic heart disease of native coronary artery without angina pectoris: Secondary | ICD-10-CM | POA: Diagnosis not present

## 2017-04-16 DIAGNOSIS — I509 Heart failure, unspecified: Secondary | ICD-10-CM | POA: Diagnosis not present

## 2017-04-16 DIAGNOSIS — J449 Chronic obstructive pulmonary disease, unspecified: Secondary | ICD-10-CM | POA: Diagnosis not present

## 2017-04-18 ENCOUNTER — Other Ambulatory Visit: Payer: Self-pay

## 2017-04-18 ENCOUNTER — Telehealth: Payer: Self-pay | Admitting: Nurse Practitioner

## 2017-04-18 DIAGNOSIS — R1310 Dysphagia, unspecified: Secondary | ICD-10-CM

## 2017-04-18 DIAGNOSIS — J449 Chronic obstructive pulmonary disease, unspecified: Secondary | ICD-10-CM | POA: Diagnosis not present

## 2017-04-18 DIAGNOSIS — Z7982 Long term (current) use of aspirin: Secondary | ICD-10-CM | POA: Diagnosis not present

## 2017-04-18 DIAGNOSIS — I2510 Atherosclerotic heart disease of native coronary artery without angina pectoris: Secondary | ICD-10-CM | POA: Diagnosis not present

## 2017-04-18 DIAGNOSIS — R69 Illness, unspecified: Secondary | ICD-10-CM | POA: Diagnosis not present

## 2017-04-18 DIAGNOSIS — R935 Abnormal findings on diagnostic imaging of other abdominal regions, including retroperitoneum: Secondary | ICD-10-CM

## 2017-04-18 DIAGNOSIS — S0181XD Laceration without foreign body of other part of head, subsequent encounter: Secondary | ICD-10-CM | POA: Diagnosis not present

## 2017-04-18 DIAGNOSIS — I509 Heart failure, unspecified: Secondary | ICD-10-CM | POA: Diagnosis not present

## 2017-04-18 DIAGNOSIS — S2243XD Multiple fractures of ribs, bilateral, subsequent encounter for fracture with routine healing: Secondary | ICD-10-CM | POA: Diagnosis not present

## 2017-04-18 DIAGNOSIS — R1013 Epigastric pain: Secondary | ICD-10-CM

## 2017-04-18 DIAGNOSIS — S301XXD Contusion of abdominal wall, subsequent encounter: Secondary | ICD-10-CM | POA: Diagnosis not present

## 2017-04-18 DIAGNOSIS — S022XXD Fracture of nasal bones, subsequent encounter for fracture with routine healing: Secondary | ICD-10-CM | POA: Diagnosis not present

## 2017-04-18 DIAGNOSIS — Z7951 Long term (current) use of inhaled steroids: Secondary | ICD-10-CM | POA: Diagnosis not present

## 2017-04-18 NOTE — Telephone Encounter (Signed)
Forwarding to [employer] [facility] and [facility].

## 2017-04-18 NOTE — Telephone Encounter (Signed)
Discussed case with Dr. Jena Gaussourk. At this point (if Cardiology clears him) we'll proceed with EGD +/- dilation on propofol. Let's hold off on TCS for now (? Ability to tolerate prep with significant dysphagia symptoms/aspiration risk). Followup appointment as recommended post-procedure.

## 2017-04-18 NOTE — Telephone Encounter (Signed)
Ok to schedule. No need to hold ASA 81 mg prior to procedure.

## 2017-04-18 NOTE — Telephone Encounter (Signed)
EG advised for pt to have EGD/+/-Dil with Propofol with RMR (see other phone note). Called and informed pt. Procedure scheduled for 05/09/17 at 1:45pm. Instructions mailed.

## 2017-04-18 NOTE — Telephone Encounter (Signed)
Ok to proceed with testing, may hold aspirin as needed. Misty StanleyLisa this patient is overdue for cardiology f/u, can we get him an appt with PA in 2-3 weeks.   Dominga FerryJ Dessiree Sze MD

## 2017-04-18 NOTE — Telephone Encounter (Signed)
Will forward to front office to schedule pt.

## 2017-04-18 NOTE — Telephone Encounter (Signed)
Called pt. EGD/+/-Dil with Propofol with RMR scheduled for 05/09/17 at 1:45pm. Instructions mailed. Orders entered.

## 2017-04-18 NOTE — Telephone Encounter (Signed)
Called and informed pt's wife of pre-op appt 05/06/17 at 11:00am. Letter mailed with procedure instructions. 

## 2017-04-18 NOTE — Telephone Encounter (Signed)
Called and informed pt's wife of pre-op appt 05/06/17 at 11:00am. Letter mailed with procedure instructions.

## 2017-04-23 DIAGNOSIS — Z7982 Long term (current) use of aspirin: Secondary | ICD-10-CM | POA: Diagnosis not present

## 2017-04-23 DIAGNOSIS — I2510 Atherosclerotic heart disease of native coronary artery without angina pectoris: Secondary | ICD-10-CM | POA: Diagnosis not present

## 2017-04-23 DIAGNOSIS — S2243XD Multiple fractures of ribs, bilateral, subsequent encounter for fracture with routine healing: Secondary | ICD-10-CM | POA: Diagnosis not present

## 2017-04-23 DIAGNOSIS — S0181XD Laceration without foreign body of other part of head, subsequent encounter: Secondary | ICD-10-CM | POA: Diagnosis not present

## 2017-04-23 DIAGNOSIS — J449 Chronic obstructive pulmonary disease, unspecified: Secondary | ICD-10-CM | POA: Diagnosis not present

## 2017-04-23 DIAGNOSIS — I509 Heart failure, unspecified: Secondary | ICD-10-CM | POA: Diagnosis not present

## 2017-04-23 DIAGNOSIS — S022XXD Fracture of nasal bones, subsequent encounter for fracture with routine healing: Secondary | ICD-10-CM | POA: Diagnosis not present

## 2017-04-23 DIAGNOSIS — S301XXD Contusion of abdominal wall, subsequent encounter: Secondary | ICD-10-CM | POA: Diagnosis not present

## 2017-04-23 DIAGNOSIS — Z7951 Long term (current) use of inhaled steroids: Secondary | ICD-10-CM | POA: Diagnosis not present

## 2017-04-23 DIAGNOSIS — R69 Illness, unspecified: Secondary | ICD-10-CM | POA: Diagnosis not present

## 2017-04-24 ENCOUNTER — Telehealth: Payer: Self-pay | Admitting: Internal Medicine

## 2017-04-24 NOTE — Telephone Encounter (Signed)
Pt's wife called asking to speak with the nurse about patient's upcoming procedure with RMR on 05/09/2017. Please call her at [phone]

## 2017-04-24 NOTE — Telephone Encounter (Signed)
Called pt's wife. Explained procedure that he will be having.

## 2017-04-25 DIAGNOSIS — S0181XD Laceration without foreign body of other part of head, subsequent encounter: Secondary | ICD-10-CM | POA: Diagnosis not present

## 2017-04-25 DIAGNOSIS — Z7951 Long term (current) use of inhaled steroids: Secondary | ICD-10-CM | POA: Diagnosis not present

## 2017-04-25 DIAGNOSIS — I2510 Atherosclerotic heart disease of native coronary artery without angina pectoris: Secondary | ICD-10-CM | POA: Diagnosis not present

## 2017-04-25 DIAGNOSIS — Z7982 Long term (current) use of aspirin: Secondary | ICD-10-CM | POA: Diagnosis not present

## 2017-04-25 DIAGNOSIS — I509 Heart failure, unspecified: Secondary | ICD-10-CM | POA: Diagnosis not present

## 2017-04-25 DIAGNOSIS — J449 Chronic obstructive pulmonary disease, unspecified: Secondary | ICD-10-CM | POA: Diagnosis not present

## 2017-04-25 DIAGNOSIS — S2243XD Multiple fractures of ribs, bilateral, subsequent encounter for fracture with routine healing: Secondary | ICD-10-CM | POA: Diagnosis not present

## 2017-04-25 DIAGNOSIS — S022XXD Fracture of nasal bones, subsequent encounter for fracture with routine healing: Secondary | ICD-10-CM | POA: Diagnosis not present

## 2017-04-25 DIAGNOSIS — S301XXD Contusion of abdominal wall, subsequent encounter: Secondary | ICD-10-CM | POA: Diagnosis not present

## 2017-05-01 DIAGNOSIS — I2510 Atherosclerotic heart disease of native coronary artery without angina pectoris: Secondary | ICD-10-CM | POA: Diagnosis not present

## 2017-05-01 DIAGNOSIS — Z7982 Long term (current) use of aspirin: Secondary | ICD-10-CM | POA: Diagnosis not present

## 2017-05-01 DIAGNOSIS — I509 Heart failure, unspecified: Secondary | ICD-10-CM | POA: Diagnosis not present

## 2017-05-01 DIAGNOSIS — S2243XD Multiple fractures of ribs, bilateral, subsequent encounter for fracture with routine healing: Secondary | ICD-10-CM | POA: Diagnosis not present

## 2017-05-01 DIAGNOSIS — Z7951 Long term (current) use of inhaled steroids: Secondary | ICD-10-CM | POA: Diagnosis not present

## 2017-05-01 DIAGNOSIS — S0181XD Laceration without foreign body of other part of head, subsequent encounter: Secondary | ICD-10-CM | POA: Diagnosis not present

## 2017-05-01 DIAGNOSIS — S022XXD Fracture of nasal bones, subsequent encounter for fracture with routine healing: Secondary | ICD-10-CM | POA: Diagnosis not present

## 2017-05-01 DIAGNOSIS — S301XXD Contusion of abdominal wall, subsequent encounter: Secondary | ICD-10-CM | POA: Diagnosis not present

## 2017-05-01 DIAGNOSIS — J449 Chronic obstructive pulmonary disease, unspecified: Secondary | ICD-10-CM | POA: Diagnosis not present

## 2017-05-01 NOTE — Patient Instructions (Signed)
Devin CostainFrank L Barton  05/01/2017     @PREFPERIOPPHARMACY @   Your procedure is scheduled on  05/09/2017   [facility] at  [facility]5   A.M.  Call this number if you have problems the morning of surgery:  [phone]   Remember:  Do not eat food or drink liquids after midnight.  Take these medicines the morning of surgery with A SIP OF WATER  Proscar, lisinopril, metoprolol, prilosec. Use your nebulizer and your inhaler before you come.   Do not wear jewelry, make-up or nail polish.  Do not wear lotions, powders, or perfumes, or deodorant.  Do not shave 48 hours prior to surgery.  Men may shave face and neck.  Do not bring valuables to the hospital.  [facility] is not responsible for any belongings or valuables.  Contacts, dentures or bridgework may not be worn into surgery.  Leave your suitcase in the car.  After surgery it may be brought to your room.  For patients admitted to the hospital, discharge time will be determined by your treatment team.  Patients discharged the day of surgery will not be allowed to drive home.   Name and phone number of your driver:   Family Special instructions:  Follow the diet instructions given to you by Dr Luvenia Starchourk's office.  Please read over the following fact sheets that you were given. Anesthesia Post-op Instructions and Care and Recovery After Surgery       Esophagogastroduodenoscopy Esophagogastroduodenoscopy (EGD) is a procedure to examine the lining of the esophagus, stomach, and first part of the small intestine (duodenum). This procedure is done to check for problems such as inflammation, bleeding, ulcers, or growths. During this procedure, a long, flexible, lighted tube with a camera attached (endoscope) is inserted down the throat. Tell a health care provider about:  Any allergies you have.  All medicines you are taking, including vitamins, herbs, eye drops, creams, and over-the-counter medicines.  Any problems  you or family members have had with anesthetic medicines.  Any blood disorders you have.  Any surgeries you have had.  Any medical conditions you have.  Whether you are pregnant or may be pregnant. What are the risks? Generally, this is a safe procedure. However, problems may occur, including:  Infection.  Bleeding.  A tear (perforation) in the esophagus, stomach, or duodenum.  Trouble breathing.  Excessive sweating.  Spasms of the larynx.  A slowed heartbeat.  Low blood pressure.  What happens before the procedure?  Follow instructions from your health care provider about eating or drinking restrictions.  Ask your health care provider about: ? Changing or stopping your regular medicines. This is especially important if you are taking diabetes medicines or blood thinners. ? Taking medicines such as aspirin and ibuprofen. These medicines can thin your blood. Do not take these medicines before your procedure if your health care provider instructs you not to.  Plan to have someone take you home after the procedure.  If you wear dentures, be ready to remove them before the procedure. What happens during the procedure?  To reduce your risk of infection, your health care team will wash or sanitize their hands.  An IV tube will be put in a vein in your hand or arm. You will get medicines and fluids through this tube.  You will be given one or more of the following: ? A medicine to help you relax (sedative). ? A  medicine to numb the area (local anesthetic). This medicine may be sprayed into your throat. It will make you feel more comfortable and keep you from gagging or coughing during the procedure. ? A medicine for pain.  A mouth guard may be placed in your mouth to protect your teeth and to keep you from biting on the endoscope.  You will be asked to lie on your left side.  The endoscope will be lowered down your throat into your esophagus, stomach, and  duodenum.  Air will be put into the endoscope. This will help your health care provider see better.  The lining of your esophagus, stomach, and duodenum will be examined.  Your health care provider may: ? Take a tissue sample so it can be looked at in a lab (biopsy). ? Remove growths. ? Remove objects (foreign bodies) that are stuck. ? Treat any bleeding with medicines or other devices that stop tissue from bleeding. ? Widen (dilate) or stretch narrowed areas of your esophagus and stomach.  The endoscope will be taken out. The procedure may vary among health care providers and hospitals. What happens after the procedure?  Your blood pressure, heart rate, breathing rate, and blood oxygen level will be monitored often until the medicines you were given have worn off.  Do not eat or drink anything until the numbing medicine has worn off and your gag reflex has returned. This information is not intended to replace advice given to you by your health care provider. Make sure you discuss any questions you have with your health care provider. Document Released: 07/13/2004 Document Revised: 08/18/2015 Document Reviewed: 02/03/2015 Elsevier Interactive Patient Education  2018 [employer]. Esophagogastroduodenoscopy, Care After Refer to this sheet in the next few weeks. These instructions provide you with information about caring for yourself after your procedure. Your health care provider may also give you more specific instructions. Your treatment has been planned according to current medical practices, but problems sometimes occur. Call your health care provider if you have any problems or questions after your procedure. What can I expect after the procedure? After the procedure, it is common to have:  A sore throat.  Nausea.  Bloating.  Dizziness.  Fatigue.  Follow these instructions at home:  Do not eat or drink anything until the numbing medicine (local anesthetic) has worn off  and your gag reflex has returned. You will know that the local anesthetic has worn off when you can swallow comfortably.  Do not drive for 24 hours if you received a medicine to help you relax (sedative).  If your health care provider took a tissue sample for testing during the procedure, make sure to get your test results. This is your responsibility. Ask your health care provider or the department performing the test when your results will be ready.  Keep all follow-up visits as told by your health care provider. This is important. Contact a health care provider if:  You cannot stop coughing.  You are not urinating.  You are urinating less than usual. Get help right away if:  You have trouble swallowing.  You cannot eat or drink.  You have throat or chest pain that gets worse.  You are dizzy or light-headed.  You faint.  You have nausea or vomiting.  You have chills.  You have a fever.  You have severe abdominal pain.  You have black, tarry, or bloody stools. This information is not intended to replace advice given to you by your health care  provider. Make sure you discuss any questions you have with your health care provider. Document Released: 02/27/2012 Document Revised: 08/18/2015 Document Reviewed: 02/03/2015 Elsevier Interactive Patient Education  2018 [employer].  Esophageal Dilatation Esophageal dilatation is a procedure to open a blocked or narrowed part of the esophagus. The esophagus is the long tube in your throat that carries food and liquid from your mouth to your stomach. The procedure is also called esophageal dilation. You may need this procedure if you have a buildup of scar tissue in your esophagus that makes it difficult, painful, or even impossible to swallow. This can be caused by gastroesophageal reflux disease (GERD). In rare cases, people need this procedure because they have cancer of the esophagus or a problem with the way food moves through  the esophagus. Sometimes you may need to have another dilatation to enlarge the opening of the esophagus gradually. Tell a health care provider about:  Any allergies you have.  All medicines you are taking, including vitamins, herbs, eye drops, creams, and over-the-counter medicines.  Any problems you or family members have had with anesthetic medicines.  Any blood disorders you have.  Any surgeries you have had.  Any medical conditions you have.  Any antibiotic medicines you are required to take before dental procedures. What are the risks? Generally, this is a safe procedure. However, problems can occur and include:  Bleeding from a tear in the lining of the esophagus.  A hole (perforation) in the esophagus.  What happens before the procedure?  Do not eat or drink anything after midnight on the night before the procedure or as directed by your health care provider.  Ask your health care provider about changing or stopping your regular medicines. This is especially important if you are taking diabetes medicines or blood thinners.  Plan to have someone take you home after the procedure. What happens during the procedure?  You will be given a medicine that makes you relaxed and sleepy (sedative).  A medicine may be sprayed or gargled to numb the back of the throat.  Your health care provider can use various instruments to do an esophageal dilatation. During the procedure, the instrument used will be placed in your mouth and passed down into your esophagus. Options include: ? Simple dilators. This instrument is carefully placed in the esophagus to stretch it. ? Guided wire bougies. In this method, a flexible tube (endoscope) is used to insert a wire into the esophagus. The dilator is passed over this wire to enlarge the esophagus. Then the wire is removed. ? Balloon dilators. An endoscope with a small balloon at the end is passed down into the esophagus. Inflating the balloon  gently stretches the esophagus and opens it up. What happens after the procedure?  Your blood pressure, heart rate, breathing rate, and blood oxygen level will be monitored often until the medicines you were given have worn off.  Your throat may feel slightly sore and will probably still feel numb. This will improve slowly over time.  You will not be allowed to eat or drink until the throat numbness has resolved.  If this is a same-day procedure, you may be allowed to go home once you have been able to drink, urinate, and sit on the edge of the bed without nausea or dizziness.  If this is a same-day procedure, you should have a friend or family member with you for the next 24 hours after the procedure. This information is not intended to replace  advice given to you by your health care provider. Make sure you discuss any questions you have with your health care provider. Document Released: 05/03/2005 Document Revised: 08/18/2015 Document Reviewed: 07/22/2013 Elsevier Interactive Patient Education  2018 [place] Anesthesia is a term that refers to techniques, procedures, and medicines that help a person stay safe and comfortable during a medical procedure. Monitored anesthesia care, or sedation, is one type of anesthesia. Your anesthesia specialist may recommend sedation if you will be having a procedure that does not require you to be unconscious, such as:  Cataract surgery.  A dental procedure.  A biopsy.  A colonoscopy.  During the procedure, you may receive a medicine to help you relax (sedative). There are three levels of sedation:  Mild sedation. At this level, you may feel awake and relaxed. You will be able to follow directions.  Moderate sedation. At this level, you will be sleepy. You may not remember the procedure.  Deep sedation. At this level, you will be asleep. You will not remember the procedure.  The more medicine you are given, the  deeper your level of sedation will be. Depending on how you respond to the procedure, the anesthesia specialist may change your level of sedation or the type of anesthesia to fit your needs. An anesthesia specialist will monitor you closely during the procedure. Let your health care provider know about:  Any allergies you have.  All medicines you are taking, including vitamins, herbs, eye drops, creams, and over-the-counter medicines.  Any use of steroids (by mouth or as a cream).  Any problems you or family members have had with sedatives and anesthetic medicines.  Any blood disorders you have.  Any surgeries you have had.  Any medical conditions you have, such as sleep apnea.  Whether you are pregnant or may be pregnant.  Any use of cigarettes, alcohol, or street drugs. What are the risks? Generally, this is a safe procedure. However, problems may occur, including:  Getting too much medicine (oversedation).  Nausea.  Allergic reaction to medicines.  Trouble breathing. If this happens, a breathing tube may be used to help with breathing. It will be removed when you are awake and breathing on your own.  Heart trouble.  Lung trouble.  Before the procedure Staying hydrated Follow instructions from your health care provider about hydration, which may include:  Up to 2 hours before the procedure - you may continue to drink clear liquids, such as water, clear fruit juice, black coffee, and plain tea.  Eating and drinking restrictions Follow instructions from your health care provider about eating and drinking, which may include:  8 hours before the procedure - stop eating heavy meals or foods such as meat, fried foods, or fatty foods.  6 hours before the procedure - stop eating light meals or foods, such as toast or cereal.  6 hours before the procedure - stop drinking milk or drinks that contain milk.  2 hours before the procedure - stop drinking clear  liquids.  Medicines Ask your health care provider about:  Changing or stopping your regular medicines. This is especially important if you are taking diabetes medicines or blood thinners.  Taking medicines such as aspirin and ibuprofen. These medicines can thin your blood. Do not take these medicines before your procedure if your health care provider instructs you not to.  Tests and exams  You will have a physical exam.  You may have blood tests done to show: ?  How well your kidneys and liver are working. ? How well your blood can clot.  General instructions  Plan to have someone take you home from the hospital or clinic.  If you will be going home right after the procedure, plan to have someone with you for 24 hours.  What happens during the procedure?  Your blood pressure, heart rate, breathing, level of pain and overall condition will be monitored.  An IV tube will be inserted into one of your veins.  Your anesthesia specialist will give you medicines as needed to keep you comfortable during the procedure. This may mean changing the level of sedation.  The procedure will be performed. After the procedure  Your blood pressure, heart rate, breathing rate, and blood oxygen level will be monitored until the medicines you were given have worn off.  Do not drive for 24 hours if you received a sedative.  You may: ? Feel sleepy, clumsy, or nauseous. ? Feel forgetful about what happened after the procedure. ? Have a sore throat if you had a breathing tube during the procedure. ? Vomit. This information is not intended to replace advice given to you by your health care provider. Make sure you discuss any questions you have with your health care provider. Document Released: 12/06/2004 Document Revised: 08/19/2015 Document Reviewed: 07/03/2015 Elsevier Interactive Patient Education  2018 [place], Care After These instructions provide you with  information about caring for yourself after your procedure. Your health care provider may also give you more specific instructions. Your treatment has been planned according to current medical practices, but problems sometimes occur. Call your health care provider if you have any problems or questions after your procedure. What can I expect after the procedure? After your procedure, it is common to:  Feel sleepy for several hours.  Feel clumsy and have poor balance for several hours.  Feel forgetful about what happened after the procedure.  Have poor judgment for several hours.  Feel nauseous or vomit.  Have a sore throat if you had a breathing tube during the procedure.  Follow these instructions at home: For at least 24 hours after the procedure:   Do not: ? Participate in activities in which you could fall or become injured. ? Drive. ? Use heavy machinery. ? Drink alcohol. ? Take sleeping pills or medicines that cause drowsiness. ? Make important decisions or sign legal documents. ? Take care of children on your own.  Rest. Eating and drinking  Follow the diet that is recommended by your health care provider.  If you vomit, drink water, juice, or soup when you can drink without vomiting.  Make sure you have little or no nausea before eating solid foods. General instructions  Have a responsible adult stay with you until you are awake and alert.  Take over-the-counter and prescription medicines only as told by your health care provider.  If you smoke, do not smoke without supervision.  Keep all follow-up visits as told by your health care provider. This is important. Contact a health care provider if:  You keep feeling nauseous or you keep vomiting.  You feel light-headed.  You develop a rash.  You have a fever. Get help right away if:  You have trouble breathing. This information is not intended to replace advice given to you by your health care provider.  Make sure you discuss any questions you have with your health care provider. Document Released: 07/03/2015 Document Revised: 11/02/2015 Document Reviewed: 07/03/2015  Elsevier Interactive Patient Education  2018 [employer]  

## 2017-05-06 ENCOUNTER — Encounter (HOSPITAL_COMMUNITY)
Admission: RE | Admit: 2017-05-06 | Discharge: 2017-05-06 | Disposition: A | Discharge status: Home / Self Care | Payer: Medicare HMO | Source: Ambulatory Visit | Attending: Internal Medicine | Admitting: Internal Medicine

## 2017-05-06 ENCOUNTER — Encounter (HOSPITAL_COMMUNITY): Payer: Self-pay

## 2017-05-06 ENCOUNTER — Other Ambulatory Visit: Payer: Self-pay

## 2017-05-06 DIAGNOSIS — R1310 Dysphagia, unspecified: Secondary | ICD-10-CM | POA: Diagnosis not present

## 2017-05-06 DIAGNOSIS — Z951 Presence of aortocoronary bypass graft: Secondary | ICD-10-CM | POA: Diagnosis not present

## 2017-05-06 DIAGNOSIS — I4891 Unspecified atrial fibrillation: Secondary | ICD-10-CM | POA: Diagnosis not present

## 2017-05-06 DIAGNOSIS — Z9981 Dependence on supplemental oxygen: Secondary | ICD-10-CM | POA: Diagnosis not present

## 2017-05-06 DIAGNOSIS — R339 Retention of urine, unspecified: Secondary | ICD-10-CM | POA: Diagnosis not present

## 2017-05-06 DIAGNOSIS — Z8249 Family history of ischemic heart disease and other diseases of the circulatory system: Secondary | ICD-10-CM | POA: Diagnosis not present

## 2017-05-06 DIAGNOSIS — I110 Hypertensive heart disease with heart failure: Secondary | ICD-10-CM | POA: Diagnosis not present

## 2017-05-06 DIAGNOSIS — N400 Enlarged prostate without lower urinary tract symptoms: Secondary | ICD-10-CM | POA: Diagnosis not present

## 2017-05-06 DIAGNOSIS — R634 Abnormal weight loss: Secondary | ICD-10-CM | POA: Diagnosis not present

## 2017-05-06 DIAGNOSIS — I5022 Chronic systolic (congestive) heart failure: Secondary | ICD-10-CM | POA: Diagnosis not present

## 2017-05-06 DIAGNOSIS — Z87891 Personal history of nicotine dependence: Secondary | ICD-10-CM | POA: Diagnosis not present

## 2017-05-06 DIAGNOSIS — I447 Left bundle-branch block, unspecified: Secondary | ICD-10-CM | POA: Diagnosis not present

## 2017-05-06 DIAGNOSIS — J449 Chronic obstructive pulmonary disease, unspecified: Secondary | ICD-10-CM | POA: Diagnosis not present

## 2017-05-06 DIAGNOSIS — M1990 Unspecified osteoarthritis, unspecified site: Secondary | ICD-10-CM | POA: Diagnosis not present

## 2017-05-06 DIAGNOSIS — I700 Atherosclerosis of aorta: Secondary | ICD-10-CM | POA: Diagnosis not present

## 2017-05-06 DIAGNOSIS — E785 Hyperlipidemia, unspecified: Secondary | ICD-10-CM | POA: Diagnosis not present

## 2017-05-06 DIAGNOSIS — K5900 Constipation, unspecified: Secondary | ICD-10-CM | POA: Diagnosis not present

## 2017-05-06 DIAGNOSIS — K219 Gastro-esophageal reflux disease without esophagitis: Secondary | ICD-10-CM | POA: Diagnosis not present

## 2017-05-06 DIAGNOSIS — I255 Ischemic cardiomyopathy: Secondary | ICD-10-CM | POA: Diagnosis not present

## 2017-05-06 DIAGNOSIS — I252 Old myocardial infarction: Secondary | ICD-10-CM | POA: Diagnosis not present

## 2017-05-06 DIAGNOSIS — Z7982 Long term (current) use of aspirin: Secondary | ICD-10-CM | POA: Diagnosis not present

## 2017-05-06 DIAGNOSIS — I2510 Atherosclerotic heart disease of native coronary artery without angina pectoris: Secondary | ICD-10-CM | POA: Diagnosis not present

## 2017-05-06 DIAGNOSIS — Z79899 Other long term (current) drug therapy: Secondary | ICD-10-CM | POA: Diagnosis not present

## 2017-05-06 HISTORY — DX: Acute myocardial infarction, unspecified: I21.9

## 2017-05-06 LAB — CBC WITH DIFFERENTIAL/PLATELET
Basophils Absolute: 0 10*3/uL (ref 0.0–0.1)
Basophils Relative: 0 %
Eosinophils Absolute: 0.1 10*3/uL (ref 0.0–0.7)
Eosinophils Relative: 1 %
HCT: 38.2 % — ABNORMAL LOW (ref 39.0–52.0)
Hemoglobin: 12 g/dL — ABNORMAL LOW (ref 13.0–17.0)
Lymphocytes Relative: 34 %
Lymphs Abs: 2.8 10*3/uL (ref 0.7–4.0)
MCH: 30 pg (ref 26.0–34.0)
MCHC: 31.4 g/dL (ref 30.0–36.0)
MCV: 95.5 fL (ref 78.0–100.0)
Monocytes Absolute: 1.2 10*3/uL — ABNORMAL HIGH (ref 0.1–1.0)
Monocytes Relative: 14 %
Neutro Abs: 4.3 10*3/uL (ref 1.7–7.7)
Neutrophils Relative %: 51 %
Platelets: 333 10*3/uL (ref 150–400)
RBC: 4 MIL/uL — ABNORMAL LOW (ref 4.22–5.81)
RDW: 14 % (ref 11.5–15.5)
WBC: 8.4 10*3/uL (ref 4.0–10.5)

## 2017-05-09 ENCOUNTER — Encounter (HOSPITAL_COMMUNITY): Payer: Self-pay | Admitting: *Deleted

## 2017-05-09 ENCOUNTER — Encounter (HOSPITAL_COMMUNITY)
Admission: RE | Disposition: A | Discharge status: Home / Self Care | Payer: Self-pay | Source: Ambulatory Visit | Attending: Internal Medicine

## 2017-05-09 ENCOUNTER — Ambulatory Visit (HOSPITAL_COMMUNITY): Payer: Medicare HMO | Admitting: Anesthesiology

## 2017-05-09 ENCOUNTER — Ambulatory Visit (HOSPITAL_COMMUNITY)
Admission: RE | Admit: 2017-05-09 | Discharge: 2017-05-09 | Disposition: A | Discharge status: Home / Self Care | Payer: Medicare HMO | Source: Ambulatory Visit | Attending: Internal Medicine | Admitting: Internal Medicine

## 2017-05-09 DIAGNOSIS — I252 Old myocardial infarction: Secondary | ICD-10-CM | POA: Diagnosis not present

## 2017-05-09 DIAGNOSIS — I700 Atherosclerosis of aorta: Secondary | ICD-10-CM | POA: Diagnosis not present

## 2017-05-09 DIAGNOSIS — Z87891 Personal history of nicotine dependence: Secondary | ICD-10-CM | POA: Insufficient documentation

## 2017-05-09 DIAGNOSIS — I4891 Unspecified atrial fibrillation: Secondary | ICD-10-CM | POA: Insufficient documentation

## 2017-05-09 DIAGNOSIS — I2510 Atherosclerotic heart disease of native coronary artery without angina pectoris: Secondary | ICD-10-CM | POA: Diagnosis not present

## 2017-05-09 DIAGNOSIS — M1990 Unspecified osteoarthritis, unspecified site: Secondary | ICD-10-CM | POA: Insufficient documentation

## 2017-05-09 DIAGNOSIS — K5900 Constipation, unspecified: Secondary | ICD-10-CM | POA: Diagnosis not present

## 2017-05-09 DIAGNOSIS — R1310 Dysphagia, unspecified: Secondary | ICD-10-CM | POA: Insufficient documentation

## 2017-05-09 DIAGNOSIS — N400 Enlarged prostate without lower urinary tract symptoms: Secondary | ICD-10-CM | POA: Insufficient documentation

## 2017-05-09 DIAGNOSIS — I110 Hypertensive heart disease with heart failure: Secondary | ICD-10-CM | POA: Insufficient documentation

## 2017-05-09 DIAGNOSIS — J449 Chronic obstructive pulmonary disease, unspecified: Secondary | ICD-10-CM | POA: Diagnosis not present

## 2017-05-09 DIAGNOSIS — I255 Ischemic cardiomyopathy: Secondary | ICD-10-CM | POA: Insufficient documentation

## 2017-05-09 DIAGNOSIS — Z9981 Dependence on supplemental oxygen: Secondary | ICD-10-CM | POA: Insufficient documentation

## 2017-05-09 DIAGNOSIS — R634 Abnormal weight loss: Secondary | ICD-10-CM | POA: Diagnosis not present

## 2017-05-09 DIAGNOSIS — R1013 Epigastric pain: Secondary | ICD-10-CM | POA: Diagnosis not present

## 2017-05-09 DIAGNOSIS — I447 Left bundle-branch block, unspecified: Secondary | ICD-10-CM | POA: Insufficient documentation

## 2017-05-09 DIAGNOSIS — K219 Gastro-esophageal reflux disease without esophagitis: Secondary | ICD-10-CM | POA: Insufficient documentation

## 2017-05-09 DIAGNOSIS — E785 Hyperlipidemia, unspecified: Secondary | ICD-10-CM | POA: Insufficient documentation

## 2017-05-09 DIAGNOSIS — Z8249 Family history of ischemic heart disease and other diseases of the circulatory system: Secondary | ICD-10-CM | POA: Insufficient documentation

## 2017-05-09 DIAGNOSIS — R339 Retention of urine, unspecified: Secondary | ICD-10-CM | POA: Insufficient documentation

## 2017-05-09 DIAGNOSIS — Z7982 Long term (current) use of aspirin: Secondary | ICD-10-CM | POA: Insufficient documentation

## 2017-05-09 DIAGNOSIS — I5022 Chronic systolic (congestive) heart failure: Secondary | ICD-10-CM | POA: Insufficient documentation

## 2017-05-09 DIAGNOSIS — Z951 Presence of aortocoronary bypass graft: Secondary | ICD-10-CM | POA: Insufficient documentation

## 2017-05-09 DIAGNOSIS — Z79899 Other long term (current) drug therapy: Secondary | ICD-10-CM | POA: Insufficient documentation

## 2017-05-09 HISTORY — PX: MALONEY DILATION: SHX5535

## 2017-05-09 HISTORY — PX: ESOPHAGOGASTRODUODENOSCOPY (EGD) WITH PROPOFOL: SHX5813

## 2017-05-09 SURGERY — ESOPHAGOGASTRODUODENOSCOPY (EGD) WITH PROPOFOL
Anesthesia: Monitor Anesthesia Care

## 2017-05-09 MED ORDER — STERILE WATER FOR IRRIGATION IR SOLN
Status: DC | PRN
  Administered 2017-05-09: 15 mL

## 2017-05-09 MED ORDER — LACTATED RINGERS IV SOLN
INTRAVENOUS | Status: DC
  Administered 2017-05-09: 11:00:00 via INTRAVENOUS

## 2017-05-09 MED ORDER — FENTANYL CITRATE (PF) 100 MCG/2ML IJ SOLN
INTRAMUSCULAR | Status: AC
  Filled 2017-05-09: qty 2

## 2017-05-09 MED ORDER — LIDOCAINE VISCOUS 2 % MT SOLN
5.0000 mL | Freq: Once | OROMUCOSAL | Status: AC
  Administered 2017-05-09: 5 mL via OROMUCOSAL

## 2017-05-09 MED ORDER — MIDAZOLAM HCL 2 MG/2ML IJ SOLN
INTRAMUSCULAR | Status: AC
  Filled 2017-05-09: qty 2

## 2017-05-09 MED ORDER — MIDAZOLAM HCL 2 MG/2ML IJ SOLN
1.0000 mg | INTRAMUSCULAR | Status: AC
  Administered 2017-05-09: 2 mg via INTRAVENOUS

## 2017-05-09 MED ORDER — FENTANYL CITRATE (PF) 100 MCG/2ML IJ SOLN
25.0000 ug | Freq: Once | INTRAMUSCULAR | Status: AC
  Administered 2017-05-09: 25 ug via INTRAVENOUS

## 2017-05-09 MED ORDER — LIDOCAINE VISCOUS 2 % MT SOLN
OROMUCOSAL | Status: AC
  Filled 2017-05-09: qty 15

## 2017-05-09 MED ORDER — PROPOFOL 500 MG/50ML IV EMUL
INTRAVENOUS | Status: DC | PRN
  Administered 2017-05-09: 75 ug/kg/min via INTRAVENOUS

## 2017-05-09 NOTE — Anesthesia Preprocedure Evaluation (Addendum)
Anesthesia Evaluation  Patient identified by MRN, date of birth, ID band Patient awake    Reviewed: Allergy & Precautions, NPO status , Patient's Chart, lab work & pertinent test results, reviewed documented beta blocker date and time   Airway Mallampati: I  TM Distance: >3 FB Neck ROM: Full    Dental   Pulmonary shortness of breath and with exertion, asthma , pneumonia, resolved, COPD,  COPD inhaler, former smoker,   Clear after coughing   breath sounds clear to auscultation (-) decreased breath sounds      Cardiovascular hypertension, Pt. on medications and Pt. on home beta blockers + CAD, + Past MI, + CABG, +CHF and + DOE  Normal cardiovascular exam+ dysrhythmias  Rhythm:Regular Rate:Normal  LBBB LVEF 25-30% Ischemic CM   Neuro/Psych  Headaches, negative neurological ROS  negative psych ROS   GI/Hepatic Neg liver ROS, hiatal hernia, GERD  Medicated and Controlled,  Endo/Other  Hyperlipidemia  Renal/GU negative Renal ROS   BPH    Musculoskeletal negative musculoskeletal ROS (+)   Abdominal   Peds  Hematology negative hematology ROS (+)   Anesthesia Other Findings   Reproductive/Obstetrics                            Anesthesia Physical Anesthesia Plan  ASA: III  Anesthesia Plan: MAC   Post-op Pain Management:    Induction: Intravenous  PONV Risk Score and Plan:   Airway Management Planned: Simple Face Mask  Additional Equipment:   Intra-op Plan:   Post-operative Plan:   Informed Consent: I have reviewed the patients History and Physical, chart, labs and discussed the procedure including the risks, benefits and alternatives for the proposed anesthesia with the patient or authorized representative who has indicated his/her understanding and acceptance.     Plan Discussed with:   Anesthesia Plan Comments:         Anesthesia Quick Evaluation

## 2017-05-09 NOTE — Anesthesia Postprocedure Evaluation (Signed)
Anesthesia Post Note  Patient: Devin Barton  Procedure(s) Performed: ESOPHAGOGASTRODUODENOSCOPY (EGD) WITH PROPOFOL (N/A ) MALONEY DILATION (N/A )  Patient location during evaluation: PACU Anesthesia Type: MAC Level of consciousness: awake and alert and oriented Pain management: pain level controlled Vital Signs Assessment: post-procedure vital signs reviewed and stable Respiratory status: spontaneous breathing Cardiovascular status: stable Postop Assessment: no apparent nausea or vomiting Anesthetic complications: no     Last Vitals:  Vitals:   05/09/17 1230 05/09/17 1235  BP: (!) 101/56 108/66  Pulse:    Resp: 15 17  Temp:    SpO2: 100% 100%    Last Pain:  Vitals:   05/09/17 1029  TempSrc: Axillary  PainSc: 8                  [name] A

## 2017-05-09 NOTE — Op Note (Signed)
[facility] Patient Name: Devin Barton Procedure Date: 05/09/2017 11:43 AM MRN: [id number] Date of Birth: 1946/02/27 Attending MD: Gennette Pac , MD CSN: [id number] Age: 72 Admit Type: Outpatient Procedure:                Upper GI endoscopy Indications:              Dysphagia Providers:                Gennette Pac, MD, Buel Ream. Thomasena Edis RN, RN,                            Edrick Kins, RN Referring MD:             Corrie Mckusick MD, MD Medicines:                Propofol per Anesthesia Complications:            No immediate complications. Estimated Blood Loss:     Estimated blood loss: none. Procedure:                Pre-Anesthesia Assessment:                           - Prior to the procedure, a History and Physical                            was performed, and patient medications and                            allergies were reviewed. The patient's tolerance of                            previous anesthesia was also reviewed. The risks                            and benefits of the procedure and the sedation                            options and risks were discussed with the patient.                            All questions were answered, and informed consent                            was obtained. Prior Anticoagulants: The patient has                            taken no previous anticoagulant or antiplatelet                            agents. ASA Grade Assessment: III - A patient with                            severe systemic disease. After reviewing the risks  and benefits, the patient was deemed in                            satisfactory condition to undergo the procedure.                           After obtaining informed consent, the endoscope was                            passed under direct vision. Throughout the                            procedure, the patient's blood pressure, pulse, and                            oxygen  saturations were monitored continuously. The                            [id number]) was introduced through the and                            advanced to the second part of duodenum. The upper                            GI endoscopy was accomplished without difficulty.                            The patient tolerated the procedure well. Scope In: 12:48:51 PM Scope Out: 12:56:36 PM Total Procedure Duration: 0 hours 7 minutes 45 seconds  Findings:      The examined esophagus was normal aside from an inlet patch and an       accentuated undulating Z line. no esophagitis. No extrinsic compression.       Patent appearing tubul esophagus throu.      The entire examined stomach was normal.      The duodenal bulb and second portion of the duodenum were normal. The       scope was withdrawn. Dilation was performed with a Maloney dilator with       mild resistance at 56 Fr. The dilation site was examined following       endoscope reinsertion and showed no change. Estimated blood loss: none. Impression:               - Normal esophagus. Dilated.                           - Normal stomach.                           - Normal duodenal bulb and second portion of the                            duodenum.                           - No specimens collected. Moderate Sedation:      Moderate (conscious) sedation  was personally administered by an       anesthesia professional. The following parameters were monitored: oxygen       saturation, heart rate, blood pressure, respiratory rate, EKG, adequacy       of pulmonary ventilation, and response to care. Total physician       intraservice time was 14 minutes. Recommendation:           - Written discharge instructions were provided to                            the patient.                           - The signs and symptoms of potential delayed                            complications were discussed with the patient.                           - Patient  has a contact number available for                            emergencies.                           - Return to normal activities tomorrow.                           - Resume previous diet.                           - Continue present medications. Continue omeprazole                            daily.- No repeat upper endoscopy.                           - Return to GI office in 8 weeks. If dysphagia not                            markedly improved when he returns, would consider a                            CAT scan of GE junction. If that study were                            unrevealing, then I would consider an esophageal                            manometry to evaluate for achalasia. Procedure Code(s):        --- Professional ---                           [phone], Esophagogastroduodenoscopy, flexible,  transoral; diagnostic, including collection of                            specimen(s) by brushing or washing, when performed                            (separate procedure)                           43450, Dilation of esophagus, by unguided sound or                            bougie, single or multiple passes Diagnosis Code(s):        --- Professional ---                           R13.10, Dysphagia, unspecified CPT copyright 2016 American Medical Association. All rights reserved. The codes documented in this report are preliminary and upon coder review may  be revised to meet current compliance requirements. Gerrit Friendsobert M. Norfleet Capers, MD Gennette Pacobert Michael Sarvesh Meddaugh, MD 05/09/2017 1:09:44 PM This report has been signed electronically. Number of Addenda: 0

## 2017-05-09 NOTE — Discharge Instructions (Signed)
EGD Discharge instructions Please read the instructions outlined below and refer to this sheet in the next few weeks. These discharge instructions provide you with general information on caring for yourself after you leave the hospital. Your doctor may also give you specific instructions. While your treatment has been planned according to the most current medical practices available, unavoidable complications occasionally occur. If you have any problems or questions after discharge, please call your doctor. ACTIVITY  You may resume your regular activity but move at a slower pace for the next 24 hours.   Take frequent rest periods for the next 24 hours.   Walking will help expel (get rid of) the air and reduce the bloated feeling in your abdomen.   No driving for 24 hours (because of the anesthesia (medicine) used during the test).   You may shower.   Do not sign any important legal documents or operate any machinery for 24 hours (because of the anesthesia used during the test).  NUTRITION  Drink plenty of fluids.   You may resume your normal diet.   Begin with a light meal and progress to your normal diet.   Avoid alcoholic beverages for 24 hours or as instructed by your caregiver.  MEDICATIONS  You may resume your normal medications unless your caregiver tells you otherwise.  WHAT YOU CAN EXPECT TODAY  You may experience abdominal discomfort such as a feeling of fullness or gas pains.  FOLLOW-UP  Your doctor will discuss the results of your test with you.  SEEK IMMEDIATE MEDICAL ATTENTION IF ANY OF THE FOLLOWING OCCUR:  Excessive nausea (feeling sick to your stomach) and/or vomiting.   Severe abdominal pain and distention (swelling).   Trouble swallowing.   Temperature over 101 F (37.8 C).   Rectal bleeding or vomiting of blood.    Continue omeprazole daily  Office visit with us in 6-8 weeks  If the swallowing does not significantly improve from today's  procedure, then further evaluation may be needed.

## 2017-05-09 NOTE — Interval H&P Note (Signed)
History and Physical Interval Note:  05/09/2017 12:30 PM  Devin Barton  has presented today for surgery, with the diagnosis of dysphagia, epigastric pain, abnormal abdomen CT  The various methods of treatment have been discussed with the patient and family. After consideration of risks, benefits and other options for treatment, the patient has consented to  Procedure(s) with comments: ESOPHAGOGASTRODUODENOSCOPY (EGD) WITH PROPOFOL (N/A) - 1:45pm MALONEY DILATION (N/A) as a surgical intervention .  The patient's history has been reviewed, patient examined, no change in status, stable for surgery.  I have reviewed the patient's chart and labs.  Questions were answered to the patient's satisfaction.     Devin Barton  No change. EGD with possible esophageal dilation per plan.    The risks, benefits, limitations, alternatives and imponderables have been reviewed with the patient. Potential for esophageal dilation, biopsy, etc. have also been reviewed.  Questions have been answered. All parties agreeable.

## 2017-05-09 NOTE — Transfer of Care (Signed)
Immediate Anesthesia Transfer of Care Note  Patient: Devin Barton  Procedure(s) Performed: ESOPHAGOGASTRODUODENOSCOPY (EGD) WITH PROPOFOL (N/A ) MALONEY DILATION (N/A )  Patient Location: PACU  Anesthesia Type:MAC  Level of Consciousness: awake, alert , oriented and patient cooperative  Airway & Oxygen Therapy: Patient Spontanous Breathing and Patient connected to nasal cannula oxygen  Post-op Assessment: Report given to RN and Post -op Vital signs reviewed and stable  Post vital signs: Reviewed and stable  Last Vitals:  Vitals:   05/09/17 1230 05/09/17 1235  BP: (!) 101/56 108/66  Pulse:    Resp: 15 17  Temp:    SpO2: 100% 100%    Last Pain:  Vitals:   05/09/17 1029  TempSrc: Axillary  PainSc: 8       Patients Stated Pain Goal: 5 (83/72/90 2111)  Complications: No apparent anesthesia complications

## 2017-05-10 ENCOUNTER — Encounter (HOSPITAL_COMMUNITY): Payer: Self-pay | Admitting: Internal Medicine

## 2017-05-10 DIAGNOSIS — I2510 Atherosclerotic heart disease of native coronary artery without angina pectoris: Secondary | ICD-10-CM | POA: Diagnosis not present

## 2017-05-10 DIAGNOSIS — S0181XD Laceration without foreign body of other part of head, subsequent encounter: Secondary | ICD-10-CM | POA: Diagnosis not present

## 2017-05-10 DIAGNOSIS — S2243XD Multiple fractures of ribs, bilateral, subsequent encounter for fracture with routine healing: Secondary | ICD-10-CM | POA: Diagnosis not present

## 2017-05-10 DIAGNOSIS — Z7982 Long term (current) use of aspirin: Secondary | ICD-10-CM | POA: Diagnosis not present

## 2017-05-10 DIAGNOSIS — R69 Illness, unspecified: Secondary | ICD-10-CM | POA: Diagnosis not present

## 2017-05-10 DIAGNOSIS — J449 Chronic obstructive pulmonary disease, unspecified: Secondary | ICD-10-CM | POA: Diagnosis not present

## 2017-05-10 DIAGNOSIS — Z7951 Long term (current) use of inhaled steroids: Secondary | ICD-10-CM | POA: Diagnosis not present

## 2017-05-10 DIAGNOSIS — I509 Heart failure, unspecified: Secondary | ICD-10-CM | POA: Diagnosis not present

## 2017-05-10 DIAGNOSIS — S301XXD Contusion of abdominal wall, subsequent encounter: Secondary | ICD-10-CM | POA: Diagnosis not present

## 2017-05-10 DIAGNOSIS — S022XXD Fracture of nasal bones, subsequent encounter for fracture with routine healing: Secondary | ICD-10-CM | POA: Diagnosis not present

## 2017-05-10 NOTE — Addendum Note (Signed)
Addendum  created 05/10/17 0820 by Moshe Salisburyaniel, Tran Randle E, CRNA   Intraprocedure Event edited

## 2017-05-13 ENCOUNTER — Encounter: Payer: Self-pay | Admitting: Internal Medicine

## 2017-05-13 ENCOUNTER — Other Ambulatory Visit: Payer: Self-pay | Admitting: Cardiology

## 2017-05-15 DIAGNOSIS — I5021 Acute systolic (congestive) heart failure: Secondary | ICD-10-CM | POA: Diagnosis not present

## 2017-05-15 DIAGNOSIS — J449 Chronic obstructive pulmonary disease, unspecified: Secondary | ICD-10-CM | POA: Diagnosis not present

## 2017-05-22 ENCOUNTER — Ambulatory Visit: Payer: Medicare HMO | Admitting: Urology

## 2017-05-22 DIAGNOSIS — R3912 Poor urinary stream: Secondary | ICD-10-CM | POA: Diagnosis not present

## 2017-05-22 DIAGNOSIS — R338 Other retention of urine: Secondary | ICD-10-CM | POA: Diagnosis not present

## 2017-05-23 ENCOUNTER — Ambulatory Visit: Payer: Medicare HMO | Admitting: Nurse Practitioner

## 2017-05-31 DIAGNOSIS — J449 Chronic obstructive pulmonary disease, unspecified: Secondary | ICD-10-CM | POA: Diagnosis not present

## 2017-05-31 DIAGNOSIS — I2510 Atherosclerotic heart disease of native coronary artery without angina pectoris: Secondary | ICD-10-CM | POA: Diagnosis not present

## 2017-05-31 DIAGNOSIS — J9611 Chronic respiratory failure with hypoxia: Secondary | ICD-10-CM | POA: Diagnosis not present

## 2017-05-31 DIAGNOSIS — I10 Essential (primary) hypertension: Secondary | ICD-10-CM | POA: Diagnosis not present

## 2017-06-02 DIAGNOSIS — R69 Illness, unspecified: Secondary | ICD-10-CM | POA: Diagnosis not present

## 2017-06-12 DIAGNOSIS — I5021 Acute systolic (congestive) heart failure: Secondary | ICD-10-CM | POA: Diagnosis not present

## 2017-06-12 DIAGNOSIS — J449 Chronic obstructive pulmonary disease, unspecified: Secondary | ICD-10-CM | POA: Diagnosis not present

## 2017-06-25 DIAGNOSIS — G8929 Other chronic pain: Secondary | ICD-10-CM | POA: Diagnosis not present

## 2017-06-25 DIAGNOSIS — K08109 Complete loss of teeth, unspecified cause, unspecified class: Secondary | ICD-10-CM | POA: Diagnosis not present

## 2017-06-25 DIAGNOSIS — I252 Old myocardial infarction: Secondary | ICD-10-CM | POA: Diagnosis not present

## 2017-06-25 DIAGNOSIS — J439 Emphysema, unspecified: Secondary | ICD-10-CM | POA: Diagnosis not present

## 2017-06-25 DIAGNOSIS — E785 Hyperlipidemia, unspecified: Secondary | ICD-10-CM | POA: Diagnosis not present

## 2017-06-25 DIAGNOSIS — R69 Illness, unspecified: Secondary | ICD-10-CM | POA: Diagnosis not present

## 2017-06-25 DIAGNOSIS — I2510 Atherosclerotic heart disease of native coronary artery without angina pectoris: Secondary | ICD-10-CM | POA: Diagnosis not present

## 2017-06-25 DIAGNOSIS — I10 Essential (primary) hypertension: Secondary | ICD-10-CM | POA: Diagnosis not present

## 2017-06-25 DIAGNOSIS — K219 Gastro-esophageal reflux disease without esophagitis: Secondary | ICD-10-CM | POA: Diagnosis not present

## 2017-07-03 ENCOUNTER — Ambulatory Visit: Payer: Medicare HMO | Admitting: Urology

## 2017-07-03 DIAGNOSIS — R338 Other retention of urine: Secondary | ICD-10-CM

## 2017-07-03 DIAGNOSIS — R3912 Poor urinary stream: Secondary | ICD-10-CM | POA: Diagnosis not present

## 2017-07-08 ENCOUNTER — Encounter: Payer: Self-pay | Admitting: Gastroenterology

## 2017-07-08 ENCOUNTER — Ambulatory Visit: Payer: Medicare HMO | Admitting: Gastroenterology

## 2017-07-08 VITALS — BP 118/66 | HR 77 | Temp 98.9°F | Ht 66.0 in | Wt 124.6 lb

## 2017-07-08 DIAGNOSIS — R1310 Dysphagia, unspecified: Secondary | ICD-10-CM

## 2017-07-08 DIAGNOSIS — K5900 Constipation, unspecified: Secondary | ICD-10-CM
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
## 2017-07-08 NOTE — Patient Instructions (Signed)
Decrease Prilosec to 20 milligrams each day, 30 minutes before breakfast daily. You could see how you do off of this completely, but if you have symptoms, then resume it.   I have given samples of Amitiza for constipation to take twice a day, WITH FOOD to avoid nausea. Call to let us know how this does. We can decrease the dose if needed.  Return in 3 months! Call if any issues in the meantime.  It was a pleasure to see you today. I strive to create trusting relationships with patients to provide genuine, compassionate, and quality care. I value your feedback. If you receive a survey regarding your visit,  I greatly appreciate you taking time to fill this out.   Gelene MinkAnna W. Karson Chicas, PhD, ANP-BC [facility]

## 2017-07-09 ENCOUNTER — Encounter: Payer: Self-pay | Admitting: Internal Medicine

## 2017-07-10 ENCOUNTER — Encounter: Payer: Self-pay | Admitting: Gastroenterology

## 2017-07-10 NOTE — Assessment & Plan Note (Signed)
Improved with empiric dilation. If dysphagia recurs, recommend CT GE junction. If this is unrevealing, pursue manometry. May wean Prilosec down to 20 mg daily. Desires to come off PPI: I doubt he will be able to do this, but we discussed weaning but resuming if any recurrent symptoms.

## 2017-07-10 NOTE — Assessment & Plan Note (Signed)
Unable to tolerate Linzess due to "griping" of abdomen. Will trial Amitiza 24 mcg BID. Abdominal pain improved with bowel regimen. At some point will need colonoscopy; we can discuss this at visit in 3 months. Continue to maximize bowel regimen. Weight improved overall.

## 2017-07-11 NOTE — Progress Notes (Signed)
cc'd to pcp 

## 2017-07-13 DIAGNOSIS — J449 Chronic obstructive pulmonary disease, unspecified: Secondary | ICD-10-CM | POA: Diagnosis not present

## 2017-07-13 DIAGNOSIS — I5021 Acute systolic (congestive) heart failure: Secondary | ICD-10-CM | POA: Diagnosis not present

## 2017-07-15 ENCOUNTER — Encounter: Payer: Self-pay | Admitting: Cardiology

## 2017-07-15 ENCOUNTER — Ambulatory Visit: Payer: Medicare HMO | Admitting: Cardiology

## 2017-07-15 VITALS — BP 150/78 | HR 80 | Ht 66.0 in | Wt 125.0 lb

## 2017-07-15 DIAGNOSIS — I2510 Atherosclerotic heart disease of native coronary artery without angina pectoris: Secondary | ICD-10-CM | POA: Diagnosis not present

## 2017-07-15 DIAGNOSIS — I6523 Occlusion and stenosis of bilateral carotid arteries: Secondary | ICD-10-CM | POA: Diagnosis not present

## 2017-07-15 DIAGNOSIS — I5022 Chronic systolic (congestive) heart failure: Secondary | ICD-10-CM

## 2017-07-15 MED ORDER — CARVEDILOL 3.125 MG PO TABS: 3.1250 mg | ORAL_TABLET | Freq: Two times a day (BID) | ORAL | 1 refills | Status: DC

## 2017-07-15 MED ORDER — SACUBITRIL-VALSARTAN 24-26 MG PO TABS: 1.0000 | ORAL_TABLET | Freq: Two times a day (BID) | ORAL | 3 refills | Status: DC
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
## 2017-07-15 NOTE — Patient Instructions (Signed)
Your physician recommends that you schedule a follow-up appointment in: 3 MONTHS [facility]  Your physician has recommended you make the following change in your medication:   START COREG 3.125 MG TWICE DAILY  STOP LISINOPRIL   2 DAYS AFTER STOPPING LISINOPRIL START ENTRESTO 24/26MG  TWICE DAILY  Your physician recommends that you return for lab work in: 2 WEEKS BMP - WE HAVE GIVEN YOU ORDERS  Thank you for choosing [facility]!!

## 2017-07-18 ENCOUNTER — Encounter: Payer: Self-pay | Admitting: Cardiology

## 2017-07-19 DIAGNOSIS — R69 Illness, unspecified: Secondary | ICD-10-CM | POA: Diagnosis not present

## 2017-07-23 ENCOUNTER — Other Ambulatory Visit: Payer: Self-pay | Admitting: Cardiology

## 2017-07-29 ENCOUNTER — Telehealth: Payer: Self-pay | Admitting: Cardiology

## 2017-07-29 DIAGNOSIS — I5022 Chronic systolic (congestive) heart failure: Secondary | ICD-10-CM | POA: Diagnosis not present

## 2017-07-29 NOTE — Telephone Encounter (Signed)
Pt wife aware and agreeable - recently had appt with GI doctor last week - has f/u in June with GI - pt will hold Coreg and update [place] in 2 weeks. Will also call GI as well to make them aware of symptoms

## 2017-07-29 NOTE — Telephone Encounter (Signed)
Called to let us know medication still hurting patient's stomach

## 2017-07-29 NOTE — Telephone Encounter (Signed)
Not common side effect of either medicine, never documented with entresto and only  1% risk of stomach pain with coreg. He is followed by GI for stomach issues. Can stop coreg for now, update [place] in 2 weeks on symptoms. He should also touch base with his GI doctor    Dominga Ferry MD

## 2017-07-29 NOTE — Telephone Encounter (Signed)
Patients wife stated since starting entresto and coreg he has been having a lot of abdominal pain. Patient was unavailable to come to the phone so was unable to determine severity of pain. Wife states it has been "very bad". Will forward to provider for any further recommendations.

## 2017-08-01 ENCOUNTER — Telehealth: Payer: Self-pay | Admitting: *Deleted

## 2017-08-01 NOTE — Telephone Encounter (Signed)
-----   Message from Antoine Poche, MD sent at 08/01/2017 10:59 AM EDT ----- Labs look good  Dominga Ferry MD

## 2017-08-01 NOTE — Telephone Encounter (Signed)
Pt aware - routed to pcp  

## 2017-08-03 DIAGNOSIS — J449 Chronic obstructive pulmonary disease, unspecified: Secondary | ICD-10-CM | POA: Diagnosis not present

## 2017-08-03 DIAGNOSIS — I5021 Acute systolic (congestive) heart failure: Secondary | ICD-10-CM | POA: Diagnosis not present

## 2017-08-12 DIAGNOSIS — J449 Chronic obstructive pulmonary disease, unspecified: Secondary | ICD-10-CM | POA: Diagnosis not present

## 2017-08-12 DIAGNOSIS — I5021 Acute systolic (congestive) heart failure: Secondary | ICD-10-CM | POA: Diagnosis not present

## 2017-08-13 ENCOUNTER — Other Ambulatory Visit: Payer: Self-pay | Admitting: Cardiology

## 2017-08-14 ENCOUNTER — Other Ambulatory Visit: Payer: Self-pay | Admitting: Cardiology

## 2017-08-14 DIAGNOSIS — R69 Illness, unspecified: Secondary | ICD-10-CM | POA: Diagnosis not present

## 2017-09-03 DIAGNOSIS — I5021 Acute systolic (congestive) heart failure: Secondary | ICD-10-CM | POA: Diagnosis not present

## 2017-09-03 DIAGNOSIS — J449 Chronic obstructive pulmonary disease, unspecified: Secondary | ICD-10-CM | POA: Diagnosis not present

## 2017-09-09 DIAGNOSIS — R69 Illness, unspecified: Secondary | ICD-10-CM | POA: Diagnosis not present

## 2017-09-12 DIAGNOSIS — J449 Chronic obstructive pulmonary disease, unspecified: Secondary | ICD-10-CM | POA: Diagnosis not present

## 2017-09-12 DIAGNOSIS — I5021 Acute systolic (congestive) heart failure: Secondary | ICD-10-CM | POA: Diagnosis not present

## 2017-10-03 DIAGNOSIS — I5021 Acute systolic (congestive) heart failure: Secondary | ICD-10-CM | POA: Diagnosis not present

## 2017-10-03 DIAGNOSIS — J449 Chronic obstructive pulmonary disease, unspecified: Secondary | ICD-10-CM | POA: Diagnosis not present

## 2017-10-04 DIAGNOSIS — R69 Illness, unspecified: Secondary | ICD-10-CM | POA: Diagnosis not present

## 2017-10-09 ENCOUNTER — Ambulatory Visit: Payer: Medicare HMO | Admitting: Gastroenterology

## 2017-10-09 ENCOUNTER — Telehealth: Payer: Self-pay | Admitting: *Deleted

## 2017-10-09 ENCOUNTER — Other Ambulatory Visit: Payer: Self-pay | Admitting: *Deleted

## 2017-10-09 ENCOUNTER — Encounter: Payer: Self-pay | Admitting: Gastroenterology

## 2017-10-09 ENCOUNTER — Encounter: Payer: Self-pay | Admitting: *Deleted

## 2017-10-09 VITALS — BP 116/67 | HR 58 | Temp 97.8°F | Ht 67.0 in | Wt 126.0 lb

## 2017-10-09 DIAGNOSIS — Z1211 Encounter for screening for malignant neoplasm of colon: Secondary | ICD-10-CM

## 2017-10-09 DIAGNOSIS — K5900 Constipation, unspecified: Secondary | ICD-10-CM | POA: Diagnosis not present

## 2017-10-09 MED ORDER — NA SULFATE-K SULFATE-MG SULF 17.5-3.13-1.6 GM/177ML PO SOLN: 1.0000 | ORAL | 0 refills | Status: DC

## 2017-10-09 NOTE — Assessment & Plan Note (Signed)
72 year old male with need for initial screening colonoscopy. Chronic constipation as only lower GI symptom. No rectal bleeding. No family history of colon cancer.  Proceed with TCS with Dr. Jena Gaussourk in near future: the risks, benefits, and alternatives have been discussed with the patient in detail. The patient states understanding and desires to proceed. Propofol requested Extra day of clear liquids due to constipation  Return in 6 months
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
## 2017-10-09 NOTE — Telephone Encounter (Signed)
Pre-op scheduled for 11/22/17 at 9:00am. Letter mailed. LMOVM.

## 2017-10-09 NOTE — Patient Instructions (Signed)
I would like for you to take 1 capful of Miralax each evening. We can always titrate this up or down if we need to. Call me with how you are doing!   We have arranged a colonoscopy in the near future.   We will see you in 6 months!  It was a pleasure to see you today. I strive to create trusting relationships with patients to provide genuine, compassionate, and quality care. I value your feedback. If you receive a survey regarding your visit,  I greatly appreciate you taking time to fill this out.   Gelene MinkAnna W. Korynn Kenedy, PhD, ANP-BC [facility]

## 2017-10-09 NOTE — Assessment & Plan Note (Signed)
Failed Linzess and Amitiza due to "griping" of abdomen. Trial Miralax daily for now. May need to titrate dose. Call with update.

## 2017-10-09 NOTE — Progress Notes (Signed)
CC'ED TO PCP 

## 2017-10-10 NOTE — Progress Notes (Signed)
cc'ed to pcp °

## 2017-10-12 DIAGNOSIS — J449 Chronic obstructive pulmonary disease, unspecified: Secondary | ICD-10-CM | POA: Diagnosis not present

## 2017-10-12 DIAGNOSIS — I5021 Acute systolic (congestive) heart failure: Secondary | ICD-10-CM | POA: Diagnosis not present

## 2017-10-14 ENCOUNTER — Encounter: Payer: Self-pay | Admitting: Cardiology

## 2017-10-14 ENCOUNTER — Ambulatory Visit: Payer: Medicare HMO | Admitting: Cardiology

## 2017-10-14 VITALS — BP 107/61 | HR 78 | Ht 66.0 in | Wt 122.0 lb

## 2017-10-14 DIAGNOSIS — I5022 Chronic systolic (congestive) heart failure: Secondary | ICD-10-CM

## 2017-10-14 DIAGNOSIS — I6523 Occlusion and stenosis of bilateral carotid arteries: Secondary | ICD-10-CM

## 2017-10-14 DIAGNOSIS — I2510 Atherosclerotic heart disease of native coronary artery without angina pectoris: Secondary | ICD-10-CM | POA: Diagnosis not present

## 2017-10-14 MED ORDER — CARVEDILOL 3.125 MG PO TABS: 3.1250 mg | ORAL_TABLET | Freq: Two times a day (BID) | ORAL | 1 refills | Status: DC

## 2017-10-14 NOTE — Patient Instructions (Signed)
Your physician recommends that you schedule a follow-up appointment in: 4 MON[facility]S [facility]  Your physician has recommended you make the following change in your medication:   START COREG 3.125 MG TWICE DAILY  Your physician has requested that you have a carotid duplex. This test is an ultrasound of the carotid arteries in your neck. It looks at blood flow through these arteries that supply the brain with blood. Allow one hour for this exam. There are no restrictions or special instructions.  Thank you for choosing [facility]!!
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
## 2017-10-17 ENCOUNTER — Ambulatory Visit (INDEPENDENT_AMBULATORY_CARE_PROVIDER_SITE_OTHER): Payer: Medicare HMO

## 2017-10-17 DIAGNOSIS — I6523 Occlusion and stenosis of bilateral carotid arteries: Secondary | ICD-10-CM

## 2017-10-23 ENCOUNTER — Telehealth: Payer: Self-pay | Admitting: *Deleted

## 2017-10-23 NOTE — Telephone Encounter (Signed)
Pt aware - routed to pcp  

## 2017-10-23 NOTE — Telephone Encounter (Signed)
-----   Message from Antoine PocheJonathan F Branch, MD sent at 10/21/2017 12:57 PM EDT ----- Mild to moderate blockages on US, we will continue to monitor, overall stable  J BrancH MD

## 2017-10-27 DIAGNOSIS — R69 Illness, unspecified: Secondary | ICD-10-CM | POA: Diagnosis not present

## 2017-10-29 DIAGNOSIS — R69 Illness, unspecified: Secondary | ICD-10-CM | POA: Diagnosis not present

## 2017-11-03 DIAGNOSIS — J449 Chronic obstructive pulmonary disease, unspecified: Secondary | ICD-10-CM | POA: Diagnosis not present

## 2017-11-03 DIAGNOSIS — I5021 Acute systolic (congestive) heart failure: Secondary | ICD-10-CM | POA: Diagnosis not present

## 2017-11-12 DIAGNOSIS — J449 Chronic obstructive pulmonary disease, unspecified: Secondary | ICD-10-CM | POA: Diagnosis not present

## 2017-11-12 DIAGNOSIS — I5021 Acute systolic (congestive) heart failure: Secondary | ICD-10-CM | POA: Diagnosis not present

## 2017-11-19 NOTE — Patient Instructions (Signed)
Devin CostainFrank L Barton  11/19/2017     @PREFPERIOPPHARMACY @   Your procedure is scheduled on  11/28/2017 .  Report to [facility] at  1245   P.M.  Call this number if you have problems the morning of surgery:  [phone]   Remember:  Do not eat or drink after midnight.  You may drink clear liquids until (follow the instructions given to you) .  Clear liquids allowed are:                    Water, Juice (non-citric and without pulp), Carbonated beverages, Clear Tea, Black Coffee only, Plain Jell-O only, Gatorade and Plain Popsicles only    Take these medicines the morning of surgery with A SIP OF WATER  Carvedilol, proscar, zantac, flomax, entresto.    Do not wear jewelry, make-up or nail polish.  Do not wear lotions, powders, or perfumes, or deodorant.  Do not shave 48 hours prior to surgery.  Men may shave face and neck.  Do not bring valuables to the hospital.  [facility] belongings or [facility]uables.  Contacts, dentures or bridgework may not be worn into surgery.  Leave your suitcase in the car.  After surgery it may be brought to your room.  For patients admitted to the hospital, discharge time will be determined by your treatment team.  Patients discharged the day of surgery will not be allowed to drive home.   Name and phone number of your driver:   family Special instructions:  Follow the diet and prep instructions given to you by Dr Luvenia Starchourk's office.  Please read over the following fact sheets that you were given. Anesthesia Post-op Instructions and Care and Recovery After Surgery       Colonoscopy, Adult A colonoscopy is an exam to look at the large intestine. It is done to check for problems, such as:  Lumps (tumors).  Growths (polyps).  Swelling (inflammation).  Bleeding.  What happens before the procedure? Eating and drinking Follow instructions from your doctor about eating and drinking. These instructions may  include:  A few days before the procedure - follow a low-fiber diet. ? Avoid nuts. ? Avoid seeds. ? Avoid dried fruit. ? Avoid raw fruits. ? Avoid vegetables.  1-3 days before the procedure - follow a clear liquid diet. Avoid liquids that have red or purple dye. Drink only clear liquids, such as: ? Clear broth or bouillon. ? Black coffee or tea. ? Clear juice. ? Clear soft drinks or sports drinks. ? Gelatin dessert. ? Popsicles.  On the day of the procedure - do not eat or drink anything during the 2 hours before the procedure.  Bowel prep If you were prescribed an oral bowel prep:  Take it as told by your doctor. Starting the day before your procedure, you will need to drink a lot of liquid. The liquid will cause you to poop (have bowel movements) until your poop is almost clear or light green.  If your skin or butt gets irritated from diarrhea, you may: ? Wipe the area with wipes that have medicine in them, such as adult wet wipes with aloe and vitamin E. ? Put something on your skin that soothes the area, such as petroleum jelly.  If you throw up (vomit) while drinking the bowel prep, take a break for up to 60 minutes. Then begin the bowel prep again. If you keep  throwing up and you cannot take the bowel prep without throwing up, call your doctor.  General instructions  Ask your doctor about changing or stopping your normal medicines. This is important if you take diabetes medicines or blood thinners.  Plan to have someone take you home from the hospital or clinic. What happens during the procedure?  An IV tube may be put into one of your veins.  You will be given medicine to help you relax (sedative).  To reduce your risk of infection: ? Your doctors will wash their hands. ? Your anal area will be washed with soap.  You will be asked to lie on your side with your knees bent.  Your doctor will get a long, thin, flexible tube ready. The tube will have a camera and a  light on the end.  The tube will be put into your anus.  The tube will be gently put into your large intestine.  Air will be delivered into your large intestine to keep it open. You may feel some pressure or cramping.  The camera will be used to take photos.  A small tissue sample may be removed from your body to be looked at under a microscope (biopsy). If any possible problems are found, the tissue will be sent to a lab for testing.  If small growths are found, your doctor may remove them and have them checked for cancer.  The tube that was put into your anus will be slowly removed. The procedure may vary among doctors and hospitals. What happens after the procedure?  Your doctor will check on you often until the medicines you were given have worn off.  Do not drive for 24 hours after the procedure.  You may have a small amount of blood in your poop.  You may pass gas.  You may have mild cramps or bloating in your belly (abdomen).  It is up to you to get the results of your procedure. Ask your doctor, or the department performing the procedure, when your results will be ready. This information is not intended to replace advice given to you by your health care provider. Make sure you discuss any questions you have with your health care provider. Document Released: 04/14/2010 Document Revised: 01/11/2016 Document Reviewed: 05/24/2015 Elsevier Interactive Patient Education  2017 [employer]  Colonoscopy, Adult, Care After This sheet gives you information about how to care for yourself after your procedure. Your health care provider may also give you more specific instructions. If you have problems or questions, contact your health care provider. What can I expect after the procedure? After the procedure, it is common to have:  A small amount of blood in your stool for 24 hours after the procedure.  Some gas.  Mild abdominal cramping or bloating.  Follow these  instructions at home: General instructions   For the first 24 hours after the procedure: ? Do not drive or use machinery. ? Do not sign important documents. ? Do not drink alcohol. ? Do your regular daily activities at a slower pace than normal. ? Eat soft, easy-to-digest foods. ? Rest often.  Take over-the-counter or prescription medicines only as told by your health care provider.  It is up to you to get the results of your procedure. Ask your health care provider, or the department performing the procedure, when your results will be ready. Relieving cramping and bloating  Try walking around when you have cramps or feel bloated.  Apply heat to your  abdomen as told by your health care provider. Use a heat source that your health care provider recommends, such as a moist heat pack or a heating pad. ? Place a towel between your skin and the heat source. ? Leave the heat on for 20-30 minutes. ? Remove the heat if your skin turns bright red. This is especially important if you are unable to feel pain, heat, or cold. You may have a greater risk of getting burned. Eating and drinking  Drink enough fluid to keep your urine clear or pale yellow.  Resume your normal diet as instructed by your health care provider. Avoid heavy or fried foods that are hard to digest.  Avoid drinking alcohol for as long as instructed by your health care provider. Contact a health care provider if:  You have blood in your stool 2-3 days after the procedure. Get help right away if:  You have more than a small spotting of blood in your stool.  You pass large blood clots in your stool.  Your abdomen is swollen.  You have nausea or vomiting.  You have a fever.  You have increasing abdominal pain that is not relieved with medicine. This information is not intended to replace advice given to you by your health care provider. Make sure you discuss any questions you have with your health care  provider. Document Released: 10/25/2003 Document Revised: 12/05/2015 Document Reviewed: 05/24/2015 Elsevier Interactive Patient Education  2018 [place] Anesthesia is a term that refers to techniques, procedures, and medicines that help a person stay safe and comfortable during a medical procedure. Monitored anesthesia care, or sedation, is one type of anesthesia. Your anesthesia specialist may recommend sedation if you will be having a procedure that does not require you to be unconscious, such as:  Cataract surgery.  A dental procedure.  A biopsy.  A colonoscopy.  During the procedure, you may receive a medicine to help you relax (sedative). There are three levels of sedation:  Mild sedation. At this level, you may feel awake and relaxed. You will be able to follow directions.  Moderate sedation. At this level, you will be sleepy. You may not remember the procedure.  Deep sedation. At this level, you will be asleep. You will not remember the procedure.  The more medicine you are given, the deeper your level of sedation will be. Depending on how you respond to the procedure, the anesthesia specialist may change your level of sedation or the type of anesthesia to fit your needs. An anesthesia specialist will monitor you closely during the procedure. Let your health care provider know about:  Any allergies you have.  All medicines you are taking, including vitamins, herbs, eye drops, creams, and over-the-counter medicines.  Any use of steroids (by mouth or as a cream).  Any problems you or family members have had with sedatives and anesthetic medicines.  Any blood disorders you have.  Any surgeries you have had.  Any medical conditions you have, such as sleep apnea.  Whether you are pregnant or may be pregnant.  Any use of cigarettes, alcohol, or street drugs. What are the risks? Generally, this is a safe procedure. However, problems may  occur, including:  Getting too much medicine (oversedation).  Nausea.  Allergic reaction to medicines.  Trouble breathing. If this happens, a breathing tube may be used to help with breathing. It will be removed when you are awake and breathing on your own.  Heart trouble.  Lung trouble.  Before the procedure Staying hydrated Follow instructions from your health care provider about hydration, which may include:  Up to 2 hours before the procedure - you may continue to drink clear liquids, such as water, clear fruit juice, black coffee, and plain tea.  Eating and drinking restrictions Follow instructions from your health care provider about eating and drinking, which may include:  8 hours before the procedure - stop eating heavy meals or foods such as meat, fried foods, or fatty foods.  6 hours before the procedure - stop eating light meals or foods, such as toast or cereal.  6 hours before the procedure - stop drinking milk or drinks that contain milk.  2 hours before the procedure - stop drinking clear liquids.  Medicines Ask your health care provider about:  Changing or stopping your regular medicines. This is especially important if you are taking diabetes medicines or blood thinners.  Taking medicines such as aspirin and ibuprofen. These medicines can thin your blood. Do not take these medicines before your procedure if your health care provider instructs you not to.  Tests and exams  You will have a physical exam.  You may have blood tests done to show: ? How well your kidneys and liver are working. ? How well your blood can clot.  General instructions  Plan to have someone take you home from the hospital or clinic.  If you will be going home right after the procedure, plan to have someone with you for 24 hours.  What happens during the procedure?  Your blood pressure, heart rate, breathing, level of pain and overall condition will be monitored.  An IV  tube will be inserted into one of your veins.  Your anesthesia specialist will give you medicines as needed to keep you comfortable during the procedure. This may mean changing the level of sedation.  The procedure will be performed. After the procedure  Your blood pressure, heart rate, breathing rate, and blood oxygen level will be monitored until the medicines you were given have worn off.  Do not drive for 24 hours if you received a sedative.  You may: ? Feel sleepy, clumsy, or nauseous. ? Feel forgetful about what happened after the procedure. ? Have a sore throat if you had a breathing tube during the procedure. ? Vomit. This information is not intended to replace advice given to you by your health care provider. Make sure you discuss any questions you have with your health care provider. Document Released: 12/06/2004 Document Revised: 08/19/2015 Document Reviewed: 07/03/2015 Elsevier Interactive Patient Education  2018 [place], Care After These instructions provide you with information about caring for yourself after your procedure. Your health care provider may also give you more specific instructions. Your treatment has been planned according to current medical practices, but problems sometimes occur. Call your health care provider if you have any problems or questions after your procedure. What can I expect after the procedure? After your procedure, it is common to:  Feel sleepy for several hours.  Feel clumsy and have poor balance for several hours.  Feel forgetful about what happened after the procedure.  Have poor judgment for several hours.  Feel nauseous or vomit.  Have a sore throat if you had a breathing tube during the procedure.  Follow these instructions at home: For at least 24 hours after the procedure:   Do not: ? Participate in activities in which you could fall or become injured. ?  Drive. ? Use heavy  machinery. ? Drink alcohol. ? Take sleeping pills or medicines that cause drowsiness. ? Make important decisions or sign legal documents. ? Take care of children on your own.  Rest. Eating and drinking  Follow the diet that is recommended by your health care provider.  If you vomit, drink water, juice, or soup when you can drink without vomiting.  Make sure you have little or no nausea before eating solid foods. General instructions  Have a responsible adult stay with you until you are awake and alert.  Take over-the-counter and prescription medicines only as told by your health care provider.  If you smoke, do not smoke without supervision.  Keep all follow-up visits as told by your health care provider. This is important. Contact a health care provider if:  You keep feeling nauseous or you keep vomiting.  You feel light-headed.  You develop a rash.  You have a fever. Get help right away if:  You have trouble breathing. This information is not intended to replace advice given to you by your health care provider. Make sure you discuss any questions you have with your health care provider. Document Released: 07/03/2015 Document Revised: 11/02/2015 Document Reviewed: 07/03/2015 Elsevier Interactive Patient Education  [employer].

## 2017-11-20 DIAGNOSIS — R69 Illness, unspecified: Secondary | ICD-10-CM | POA: Diagnosis not present

## 2017-11-22 ENCOUNTER — Encounter (HOSPITAL_COMMUNITY)
Admission: RE | Admit: 2017-11-22 | Discharge: 2017-11-22 | Disposition: A | Discharge status: Home / Self Care | Payer: Medicare HMO | Source: Ambulatory Visit | Attending: Internal Medicine | Admitting: Internal Medicine

## 2017-11-22 ENCOUNTER — Other Ambulatory Visit: Payer: Self-pay

## 2017-11-22 ENCOUNTER — Encounter (HOSPITAL_COMMUNITY): Payer: Self-pay

## 2017-11-22 DIAGNOSIS — Z01812 Encounter for preprocedural laboratory examination: Secondary | ICD-10-CM | POA: Insufficient documentation

## 2017-11-22 LAB — BASIC METABOLIC PANEL
ANION GAP: 7 (ref 5–15)
BUN: 14 mg/dL (ref 8–23)
CHLORIDE: 99 mmol/L (ref 98–111)
CO2: 30 mmol/L (ref 22–32)
Calcium: 9.3 mg/dL (ref 8.9–10.3)
Creatinine, Ser: 0.75 mg/dL (ref 0.61–1.24)
GFR calc Af Amer: >60 mL/min (ref >60)
GLUCOSE: 96 mg/dL (ref 70–99)
POTASSIUM: 4.8 mmol/L (ref 3.5–5.1)
Sodium: 136 mmol/L (ref 135–145)

## 2017-11-22 LAB — CBC WITH DIFFERENTIAL/PLATELET
BASOS ABS: 0 10*3/uL (ref 0.0–0.1)
Basophils Relative: 0 %
EOS PCT: 1 %
Eosinophils Absolute: 0 10*3/uL (ref 0.0–0.7)
HEMATOCRIT: 36.3 % — AB (ref 39.0–52.0)
HEMOGLOBIN: 11.8 g/dL — AB (ref 13.0–17.0)
LYMPHS PCT: 22 %
Lymphs Abs: 1.6 10*3/uL (ref 0.7–4.0)
MCH: 31.8 pg (ref 26.0–34.0)
MCHC: 32.5 g/dL (ref 30.0–36.0)
MCV: 97.8 fL (ref 78.0–100.0)
Monocytes Absolute: 0.5 10*3/uL (ref 0.1–1.0)
Monocytes Relative: 7 %
NEUTROS ABS: 5.1 10*3/uL (ref 1.7–7.7)
NEUTROS PCT: 70 %
PLATELETS: 304 10*3/uL (ref 150–400)
RBC: 3.71 MIL/uL — AB (ref 4.22–5.81)
RDW: 14.8 % (ref 11.5–15.5)
WBC: 7.3 10*3/uL (ref 4.0–10.5)

## 2017-11-26 DIAGNOSIS — R69 Illness, unspecified: Secondary | ICD-10-CM | POA: Diagnosis not present

## 2017-11-27 DIAGNOSIS — R69 Illness, unspecified: Secondary | ICD-10-CM | POA: Diagnosis not present

## 2017-11-28 ENCOUNTER — Encounter (HOSPITAL_COMMUNITY)
Admission: RE | Disposition: A | Discharge status: Home / Self Care | Payer: Self-pay | Source: Ambulatory Visit | Attending: Internal Medicine

## 2017-11-28 ENCOUNTER — Ambulatory Visit (HOSPITAL_COMMUNITY)
Admission: RE | Admit: 2017-11-28 | Discharge: 2017-11-28 | Disposition: A | Discharge status: Home / Self Care | Payer: Medicare HMO | Source: Ambulatory Visit | Attending: Internal Medicine | Admitting: Internal Medicine

## 2017-11-28 ENCOUNTER — Ambulatory Visit (HOSPITAL_COMMUNITY): Payer: Medicare HMO | Admitting: Anesthesiology

## 2017-11-28 ENCOUNTER — Encounter (HOSPITAL_COMMUNITY): Payer: Self-pay | Admitting: *Deleted

## 2017-11-28 DIAGNOSIS — Z951 Presence of aortocoronary bypass graft: Secondary | ICD-10-CM | POA: Diagnosis not present

## 2017-11-28 DIAGNOSIS — I2510 Atherosclerotic heart disease of native coronary artery without angina pectoris: Secondary | ICD-10-CM | POA: Insufficient documentation

## 2017-11-28 DIAGNOSIS — R69 Illness, unspecified: Secondary | ICD-10-CM | POA: Diagnosis not present

## 2017-11-28 DIAGNOSIS — I5022 Chronic systolic (congestive) heart failure: Secondary | ICD-10-CM | POA: Diagnosis not present

## 2017-11-28 DIAGNOSIS — Z7982 Long term (current) use of aspirin: Secondary | ICD-10-CM | POA: Insufficient documentation

## 2017-11-28 DIAGNOSIS — I252 Old myocardial infarction: Secondary | ICD-10-CM | POA: Diagnosis not present

## 2017-11-28 DIAGNOSIS — K219 Gastro-esophageal reflux disease without esophagitis: Secondary | ICD-10-CM | POA: Diagnosis not present

## 2017-11-28 DIAGNOSIS — F17210 Nicotine dependence, cigarettes, uncomplicated: Secondary | ICD-10-CM | POA: Diagnosis not present

## 2017-11-28 DIAGNOSIS — I10 Essential (primary) hypertension: Secondary | ICD-10-CM | POA: Diagnosis not present

## 2017-11-28 DIAGNOSIS — K5909 Other constipation: Secondary | ICD-10-CM | POA: Diagnosis not present

## 2017-11-28 DIAGNOSIS — I4891 Unspecified atrial fibrillation: Secondary | ICD-10-CM | POA: Diagnosis not present

## 2017-11-28 DIAGNOSIS — I110 Hypertensive heart disease with heart failure: Secondary | ICD-10-CM | POA: Diagnosis not present

## 2017-11-28 DIAGNOSIS — Z9981 Dependence on supplemental oxygen: Secondary | ICD-10-CM | POA: Insufficient documentation

## 2017-11-28 DIAGNOSIS — J449 Chronic obstructive pulmonary disease, unspecified: Secondary | ICD-10-CM | POA: Insufficient documentation

## 2017-11-28 DIAGNOSIS — K5730 Diverticulosis of large intestine without perforation or abscess without bleeding: Secondary | ICD-10-CM | POA: Insufficient documentation

## 2017-11-28 DIAGNOSIS — Z1211 Encounter for screening for malignant neoplasm of colon: Secondary | ICD-10-CM

## 2017-11-28 DIAGNOSIS — Z79899 Other long term (current) drug therapy: Secondary | ICD-10-CM | POA: Insufficient documentation

## 2017-11-28 HISTORY — PX: COLONOSCOPY WITH PROPOFOL: SHX5780

## 2017-11-28 SURGERY — COLONOSCOPY WITH PROPOFOL
Anesthesia: Monitor Anesthesia Care

## 2017-11-28 MED ORDER — CHLORHEXIDINE GLUCONATE CLOTH 2 % EX PADS: 6.0000 | MEDICATED_PAD | Freq: Once | CUTANEOUS | Status: DC

## 2017-11-28 MED ORDER — HYDROMORPHONE HCL 1 MG/ML IJ SOLN: 0.2500 mg | INTRAMUSCULAR | Status: DC | PRN

## 2017-11-28 MED ORDER — HYDROCODONE-ACETAMINOPHEN 7.5-325 MG PO TABS: 1.0000 | ORAL_TABLET | Freq: Once | ORAL | Status: DC | PRN

## 2017-11-28 MED ORDER — PROPOFOL 10 MG/ML IV BOLUS
INTRAVENOUS | Status: AC
  Filled 2017-11-28: qty 40

## 2017-11-28 MED ORDER — LACTATED RINGERS IV SOLN
INTRAVENOUS | Status: DC
  Administered 2017-11-28: 1000 mL via INTRAVENOUS

## 2017-11-28 MED ORDER — PROMETHAZINE HCL 25 MG/ML IJ SOLN: 6.2500 mg | INTRAMUSCULAR | Status: DC | PRN

## 2017-11-28 MED ORDER — MEPERIDINE HCL 100 MG/ML IJ SOLN: 6.2500 mg | INTRAMUSCULAR | Status: DC | PRN

## 2017-11-28 MED ORDER — SODIUM CHLORIDE 0.9 % IJ SOLN
INTRAMUSCULAR | Status: AC
  Filled 2017-11-28: qty 20

## 2017-11-28 MED ORDER — PROPOFOL 10 MG/ML IV BOLUS
INTRAVENOUS | Status: DC | PRN
  Administered 2017-11-28: 30 mg via INTRAVENOUS

## 2017-11-28 MED ORDER — STERILE WATER FOR IRRIGATION IR SOLN
Status: DC | PRN
  Administered 2017-11-28: 100 mL

## 2017-11-28 MED ORDER — PROPOFOL 500 MG/50ML IV EMUL
INTRAVENOUS | Status: DC | PRN
  Administered 2017-11-28: 100 ug/kg/min via INTRAVENOUS

## 2017-11-28 MED ORDER — LACTATED RINGERS IV SOLN: INTRAVENOUS | Status: DC

## 2017-11-28 MED ORDER — SIMETHICONE 40 MG/0.6ML PO SUSP
ORAL | Status: AC
  Filled 2017-11-28: qty 1.2

## 2017-11-28 MED ORDER — EPHEDRINE SULFATE 50 MG/ML IJ SOLN
INTRAMUSCULAR | Status: AC
  Filled 2017-11-28: qty 1
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
## 2017-11-28 NOTE — Anesthesia Preprocedure Evaluation (Signed)
Anesthesia Evaluation  Patient identified by MRN, date of birth, ID band Patient awake    Reviewed: Allergy & Precautions, H&P , NPO status , Patient's Chart, lab work & pertinent test results, reviewed documented beta blocker date and time   Airway Mallampati: II  TM Distance: >3 FB Neck ROM: full    Dental no notable dental hx. (+) Edentulous Upper, Edentulous Lower   Pulmonary neg pulmonary ROS, asthma , pneumonia, COPD, Current Smoker,    Pulmonary exam normal breath sounds clear to auscultation + wheezing      Cardiovascular Exercise Tolerance: Good hypertension, + CAD, + Past MI and +CHF  negative cardio ROS  + dysrhythmias  Rhythm:regular Rate:Normal     Neuro/Psych  Headaches, negative neurological ROS  negative psych ROS   GI/Hepatic negative GI ROS, Neg liver ROS, hiatal hernia, GERD  ,  Endo/Other  negative endocrine ROS  Renal/GU negative Renal ROS  negative genitourinary   Musculoskeletal   Abdominal   Peds  Hematology negative hematology ROS (+)   Anesthesia Other Findings Oxygen, inhaler, nebulizer dependent COPD  Reproductive/Obstetrics negative OB ROS                             Anesthesia Physical Anesthesia Plan  ASA: IV  Anesthesia Plan: MAC   Post-op Pain Management:    Induction:   PONV Risk Score and Plan:   Airway Management Planned:   Additional Equipment:   Intra-op Plan:   Post-operative Plan:   Informed Consent: I have reviewed the patients History and Physical, chart, labs and discussed the procedure including the risks, benefits and alternatives for the proposed anesthesia with the patient or authorized representative who has indicated his/her understanding and acceptance.   Dental Advisory Given  Plan Discussed with: CRNA and Anesthesiologist  Anesthesia Plan Comments:         Anesthesia Quick Evaluation

## 2017-11-28 NOTE — Anesthesia Postprocedure Evaluation (Signed)
Anesthesia Post Note  Patient: Devin Barton  Procedure(s) Performed: COLONOSCOPY WITH PROPOFOL (N/A )  Patient location during evaluation: PACU Anesthesia Type: MAC Level of consciousness: awake and alert and oriented Pain management: pain level controlled Vital Signs Assessment: post-procedure vital signs reviewed and stable Respiratory status: spontaneous breathing Cardiovascular status: stable Postop Assessment: no apparent nausea or vomiting Anesthetic complications: no     Last Vitals:  Vitals:   11/28/17 1330 11/28/17 1440  BP: 115/70 (!) 99/57  Pulse:  (!) 45  Resp: 18 15  Temp: 36.4 C (P) 37.1 C  SpO2: 99% 99%    Last Pain:  Vitals:   11/28/17 1431  TempSrc:   PainSc: 0-No pain                 Randalyn Ahmed

## 2017-11-28 NOTE — Transfer of Care (Signed)
Immediate Anesthesia Transfer of Care Note  Patient: Devin Barton  Procedure(s) Performed: COLONOSCOPY WITH PROPOFOL (N/A )  Patient Location: PACU  Anesthesia Type:MAC  Level of Consciousness: awake, alert  and oriented  Airway & Oxygen Therapy: Patient Spontanous Breathing  Post-op Assessment: Report given to RN  Post vital signs: Reviewed  Last Vitals:  Vitals Value Taken Time  BP 99/57 11/28/2017  2:39 PM  Temp    Pulse 69 11/28/2017  2:40 PM  Resp 15 11/28/2017  2:40 PM  SpO2 100 % 11/28/2017  2:40 PM  Vitals shown include unvalidated device data.  Last Pain:  Vitals:   11/28/17 1431  TempSrc:   PainSc: 0-No pain      Patients Stated Pain Goal: 7 (85/27/78 2423)  Complications: No apparent anesthesia complications

## 2017-11-28 NOTE — Discharge Instructions (Signed)
Colonoscopy Discharge Instructions  Read the instructions outlined below and refer to this sheet in the next few weeks. These discharge instructions provide you with general information on caring for yourself after you leave the hospital. Your doctor may also give you specific instructions. While your treatment has been planned according to the most current medical practices available, unavoidable complications occasionally occur. If you have any problems or questions after discharge, call Dr. Jena Gauss at [phone]. ACTIVITY  You may resume your regular activity, but move at a slower pace for the next 24 hours.   Take frequent rest periods for the next 24 hours.   Walking will help get rid of the air and reduce the bloated feeling in your belly (abdomen).   No driving for 24 hours (because of the medicine (anesthesia) used during the test).    Do not sign any important legal documents or operate any machinery for 24 hours (because of the anesthesia used during the test).  NUTRITION  Drink plenty of fluids.   You may resume your normal diet as instructed by your doctor.   Begin with a light meal and progress to your normal diet. Heavy or fried foods are harder to digest and may make you feel sick to your stomach (nauseated).   Avoid alcoholic beverages for 24 hours or as instructed.  MEDICATIONS  You may resume your normal medications unless your doctor tells you otherwise.  WHAT YOU CAN EXPECT TODAY  Some feelings of bloating in the abdomen.   Passage of more gas than usual.   Spotting of blood in your stool or on the toilet paper.  IF YOU HAD POLYPS REMOVED DURING THE COLONOSCOPY:  No aspirin products for 7 days or as instructed.   No alcohol for 7 days or as instructed.   Eat a soft diet for the next 24 hours.  FINDING OUT THE RESULTS OF YOUR TEST Not all test results are available during your visit. If your test results are not back during the visit, make an appointment  with your caregiver to find out the results. Do not assume everything is normal if you have not heard from your caregiver or the medical facility. It is important for you to follow up on all of your test results.  SEEK IMMEDIATE MEDICAL ATTENTION IF:  You have more than a spotting of blood in your stool.   Your belly is swollen (abdominal distention).   You are nauseated or vomiting.   You have a temperature over 101.   You have abdominal pain or discomfort that is severe or gets worse throughout the day.    Colon Diverticulosis and constipation information provided  Begin MiraLAX 17 g orally twice daily  Begin Benefiber 1 tablespoon daily for 3 weeks then increase to 1 tablespoon twice daily thereafter  Office visit with [place] in 3 months  PATIENT INSTRUCTIONS POST-ANESTHESIA  IMMEDIATELY FOLLOWING SURGERY:  Do not drive or operate machinery for the first twenty four hours after surgery.  Do not make any important decisions for twenty four hours after surgery or while taking narcotic pain medications or sedatives.  If you develop intractable nausea and vomiting or a severe headache please notify your doctor immediately.  FOLLOW-UP:  Please make an appointment with your surgeon as instructed. You do not need to follow up with anesthesia unless specifically instructed to do so.  WOUND CARE INSTRUCTIONS (if applicable):  Keep a dry clean dressing on the anesthesia/puncture wound site if there is drainage.  Once  the wound has quit draining you may leave it open to air.  Generally you should leave the bandage intact for twenty four hours unless there is drainage.  If the epidural site drains for more than 36-48 hours please call the anesthesia department.  QUESTIONS?:  Please feel free to call your physician or the hospital operator if you have any questions, and they will be happy to assist you.       Diverticulosis Diverticulosis is a condition that develops when small pouches  (diverticula) form in the wall of the large intestine (colon). The colon is where water is absorbed and stool is formed. The pouches form when the inside layer of the colon pushes through weak spots in the outer layers of the colon. You may have a few pouches or many of them. What are the causes? The cause of this condition is not known. What increases the risk? The following factors may make you more likely to develop this condition:  Being older than age 25. Your risk for this condition increases with age. Diverticulosis is rare among people younger than age 87. By age 19, many people have it.  Eating a low-fiber diet.  Having frequent constipation.  Being overweight.  Not getting enough exercise.  Smoking.  Taking over-the-counter pain medicines, like aspirin and ibuprofen.  Having a family history of diverticulosis.  What are the signs or symptoms? In most people, there are no symptoms of this condition. If you do have symptoms, they may include:  Bloating.  Cramps in the abdomen.  Constipation or diarrhea.  Pain in the lower left side of the abdomen.  How is this diagnosed? This condition is most often diagnosed during an exam for other colon problems. Because diverticulosis usually has no symptoms, it often cannot be diagnosed independently. This condition may be diagnosed by:  Using a flexible scope to examine the colon (colonoscopy).  Taking an X-ray of the colon after dye has been put into the colon (barium enema).  Doing a CT scan.  How is this treated? You may not need treatment for this condition if you have never developed an infection related to diverticulosis. If you have had an infection before, treatment may include:  Eating a high-fiber diet. This may include eating more fruits, vegetables, and grains.  Taking a fiber supplement.  Taking a live bacteria supplement (probiotic).  Taking medicine to relax your colon.  Taking antibiotic  medicines.  Follow these instructions at home:  Drink 6-8 glasses of water or more each day to prevent constipation.  Try not to strain when you have a bowel movement.  If you have had an infection before: ? Eat more fiber as directed by your health care provider or your diet and nutrition specialist (dietitian). ? Take a fiber supplement or probiotic, if your health care provider approves.  Take over-the-counter and prescription medicines only as told by your health care provider.  If you were prescribed an antibiotic, take it as told by your health care provider. Do not stop taking the antibiotic even if you start to feel better.  Keep all follow-up visits as told by your health care provider. This is important. Contact a health care provider if:  You have pain in your abdomen.  You have bloating.  You have cramps.  You have not had a bowel movement in 3 days. Get help right away if:  Your pain gets worse.  Your bloating becomes very bad.  You have a fever or chills,  and your symptoms suddenly get worse.  You vomit.  You have bowel movements that are bloody or black.  You have bleeding from your rectum. Summary  Diverticulosis is a condition that develops when small pouches (diverticula) form in the wall of the large intestine (colon).  You may have a few pouches or many of them.  This condition is most often diagnosed during an exam for other colon problems.  If you have had an infection related to diverticulosis, treatment may include increasing the fiber in your diet, taking supplements, or taking medicines. This information is not intended to replace advice given to you by your health care provider. Make sure you discuss any questions you have with your health care provider. Document Released: 12/08/2003 Document Revised: 01/30/2016 Document Reviewed: 01/30/2016 Elsevier Interactive Patient Education  2017 Elsevier Inc.   High-Fiber Diet Fiber, also called  dietary fiber, is a type of carbohydrate found in fruits, vegetables, whole grains, and beans. A high-fiber diet can have many health benefits. Your health care provider may recommend a high-fiber diet to help:  Prevent constipation. Fiber can make your bowel movements more regular.  Lower your cholesterol.  Relieve hemorrhoids, uncomplicated diverticulosis, or irritable bowel syndrome.  Prevent overeating as part of a weight-loss plan.  Prevent heart disease, type 2 diabetes, and certain cancers.  What is my plan? The recommended daily intake of fiber includes:  38 grams for men under age 79.  30 grams for men over age 75.  25 grams for women under age 54.  21 grams for women over age 65.  You can get the recommended daily intake of dietary fiber by eating a variety of fruits, vegetables, grains, and beans. Your health care provider may also recommend a fiber supplement if it is not possible to get enough fiber through your diet. What do I need to know about a high-fiber diet?  Fiber supplements have not been widely studied for their effectiveness, so it is better to get fiber through food sources.  Always check the fiber content on thenutrition facts label of any prepackaged food. Look for foods that contain at least 5 grams of fiber per serving.  Ask your dietitian if you have questions about specific foods that are related to your condition, especially if those foods are not listed in the following section.  Increase your daily fiber consumption gradually. Increasing your intake of dietary fiber too quickly may cause bloating, cramping, or gas.  Drink plenty of water. Water helps you to digest fiber. What foods can I eat? Grains Whole-grain breads. Multigrain cereal. Oats and oatmeal. Brown rice. Barley. Bulgur wheat. Millet. Bran muffins. Popcorn. Rye wafer crackers. Vegetables Sweet potatoes. Spinach. Kale. Artichokes. Cabbage. Broccoli. Green peas. Carrots.  Squash. Fruits Berries. Pears. Apples. Oranges. Avocados. Prunes and raisins. Dried figs. Meats and Other Protein Sources [place], kidney, pinto, and soy beans. Split peas. Lentils. Nuts and seeds. Dairy Fiber-fortified yogurt. Beverages Fiber-fortified soy milk. Fiber-fortified orange juice. Other Fiber bars. The items listed above may not be a complete list of recommended foods or beverages. Contact your dietitian for more options. What foods are not recommended? Grains White bread. Pasta made with refined flour. White rice. Vegetables Fried potatoes. Canned vegetables. Well-cooked vegetables. Fruits Fruit juice. Cooked, strained fruit. Meats and Other Protein Sources Fatty cuts of meat. Fried Environmental education officer or fried fish. Dairy Milk. Yogurt. Cream cheese. Sour cream. Beverages Soft drinks. Other Cakes and pastries. Butter and oils. The items listed above may not be a complete  list of foods and beverages to avoid. Contact your dietitian for more information. What are some tips for including high-fiber foods in my diet?  Eat a wide variety of high-fiber foods.  Make sure that half of all grains consumed each day are whole grains.  Replace breads and cereals made from refined flour or white flour with whole-grain breads and cereals.  Replace white rice with brown rice, bulgur wheat, or millet.  Start the day with a breakfast that is high in fiber, such as a cereal that contains at least 5 grams of fiber per serving.  Use beans in place of meat in soups, salads, or pasta.  Eat high-fiber snacks, such as berries, raw vegetables, nuts, or popcorn. This information is not intended to replace advice given to you by your health care provider. Make sure you discuss any questions you have with your health care provider. Document Released: 03/12/2005 Document Revised: 08/18/2015 Document Reviewed: 08/25/2013 Elsevier Interactive Patient Education  2018 [employer].  Constipation,  Adult Constipation is when a person:  Poops (has a bowel movement) fewer times in a week than normal.  Has a hard time pooping.  Has poop that is dry, hard, or bigger than normal.  Follow these instructions at home: Eating and drinking   Eat foods that have a lot of fiber, such as: ? Fresh fruits and vegetables. ? Whole grains. ? Beans.  Eat less of foods that are high in fat, low in fiber, or overly processed, such as: ? [place] fries. ? Hamburgers. ? Cookies. ? Candy. ? Soda.  Drink enough fluid to keep your pee (urine) clear or pale yellow. General instructions  Exercise regularly or as told by your doctor.  Go to the restroom when you feel like you need to poop. Do not hold it in.  Take over-the-counter and prescription medicines only as told by your doctor. These include any fiber supplements.  Do pelvic floor retraining exercises, such as: ? Doing deep breathing while relaxing your lower belly (abdomen). ? Relaxing your pelvic floor while pooping.  Watch your condition for any changes.  Keep all follow-up visits as told by your doctor. This is important. Contact a doctor if:  You have pain that gets worse.  You have a fever.  You have not pooped for 4 days.  You throw up (vomit).  You are not hungry.  You lose weight.  You are bleeding from the anus.  You have thin, pencil-like poop (stool). Get help right away if:  You have a fever, and your symptoms suddenly get worse.  You leak poop or have blood in your poop.  Your belly feels hard or bigger than normal (is bloated).  You have very bad belly pain.  You feel dizzy or you faint. This information is not intended to replace advice given to you by your health care provider. Make sure you discuss any questions you have with your health care provider. Document Released: 08/29/2007 Document Revised: 09/30/2015 Document Reviewed: 08/31/2015 Elsevier Interactive Patient Education  2018 [employer].

## 2017-11-28 NOTE — Op Note (Addendum)
[facility] Patient Name: Devin Barton Procedure Date: 11/28/2017 1:44 PM MRN: [id number] Date of Birth: 04/03/1945 Attending MD: Gennette Pac , MD CSN: [id number] Age: 72 Admit Type: Outpatient Procedure:                Colonoscopy Indications:              Screening for colorectal malignant neoplasm Providers:                Gennette Pac, MD, Edrick Kins, RN, Burke Keels, Technician Referring MD:              Medicines:                Propofol per Anesthesia Complications:            No immediate complications. Estimated Blood Loss:     Estimated blood loss: none. Procedure:                Pre-Anesthesia Assessment:                           - Prior to the procedure, a History and Physical                            was performed, and patient medications and                            allergies were reviewed. The patient's tolerance of                            previous anesthesia was also reviewed. The risks                            and benefits of the procedure and the sedation                            options and risks were discussed with the patient.                            All questions were answered, and informed consent                            was obtained. ASA Grade Assessment: II - A patient                            with mild systemic disease. After reviewing the                            risks and benefits, the patient was deemed in                            satisfactory condition to undergo the procedure.  After obtaining informed consent, the colonoscope                            was passed under direct vision. Throughout the                            procedure, the patient's blood pressure, pulse, and                            oxygen saturations were monitored continuously. The                            [id number]-[id number] ([id number]) scope was introduced through     the anus and advanced to the the cecum, identified                            by appendiceal orifice and ileocecal valve. The                            colonoscopy was performed without difficulty. The                            patient tolerated the procedure well. The quality                            of the bowel preparation was adequate. Scope In: 2:15:39 PM Scope Out: 2:29:38 PM Scope Withdrawal Time: 0 hours 8 minutes 47 seconds  Total Procedure Duration: 0 hours 13 minutes 59 seconds  Findings:      The perianal and digital rectal examinations were normal.      Scattered medium-mouthed diverticula were found in the sigmoid colon and       descending colon.      The exam was otherwise without abnormality on direct and retroflexion       views. Impression:               - Diverticulosis in the sigmoid colon and in the                            descending colon.                           - The examination was otherwise normal on direct                            and retroflexion views.                           - No specimens collected. Moderate Sedation:      Moderate (conscious) sedation was personally administered by an       anesthesia professional. The following parameters were monitored: oxygen       saturation, heart rate, blood pressure, respiratory rate, EKG, adequacy       of pulmonary ventilation, and response to care. Total physician       intraservice time was 20 minutes. Recommendation:           -  Patient has a contact number available for                            emergencies. The signs and symptoms of potential                            delayed complications were discussed with the                            patient. Return to normal activities tomorrow.                            Written discharge instructions were provided to the                            patient.                           - Resume previous diet.                           - Continue  present medications. MiraLAX 17 g orally                            twice daily. Benefiber 1 tablespoon twice daily                           - Return to GI office in 3 months.                           - No repeat colonoscopy due to age. Procedure Code(s):        --- Professional ---                           [phone], Colonoscopy, flexible; diagnostic, including                            collection of specimen(s) by brushing or washing,                            when performed (separate procedure) Diagnosis Code(s):        --- Professional ---                           Z12.11, Encounter for screening for malignant                            neoplasm of colon                           K57.30, Diverticulosis of large intestine without                            perforation or abscess without bleeding CPT copyright 2018 American Medical Association. All rights reserved. The codes documented in this report are preliminary and upon  coder review may  be revised to meet current compliance requirements. Devin Barton. Devin Chong, MD Gennette Pac, MD 11/28/2017 2:35:35 PM This report has been signed electronically. Number of Addenda: 0

## 2017-12-03 ENCOUNTER — Encounter (HOSPITAL_COMMUNITY): Payer: Self-pay | Admitting: Internal Medicine

## 2017-12-10 ENCOUNTER — Other Ambulatory Visit: Payer: Self-pay | Admitting: Cardiology

## 2017-12-13 DIAGNOSIS — J449 Chronic obstructive pulmonary disease, unspecified: Secondary | ICD-10-CM | POA: Diagnosis not present

## 2017-12-13 DIAGNOSIS — I5021 Acute systolic (congestive) heart failure: Secondary | ICD-10-CM | POA: Diagnosis not present

## 2017-12-13 DIAGNOSIS — R69 Illness, unspecified: Secondary | ICD-10-CM | POA: Diagnosis not present

## 2018-01-03 DIAGNOSIS — R69 Illness, unspecified: Secondary | ICD-10-CM | POA: Diagnosis not present

## 2018-01-05 DIAGNOSIS — R69 Illness, unspecified: Secondary | ICD-10-CM | POA: Diagnosis not present

## 2018-01-08 ENCOUNTER — Ambulatory Visit: Payer: Medicare HMO | Admitting: Urology

## 2018-01-08 DIAGNOSIS — R338 Other retention of urine: Secondary | ICD-10-CM | POA: Diagnosis not present

## 2018-01-08 DIAGNOSIS — R3912 Poor urinary stream: Secondary | ICD-10-CM | POA: Diagnosis not present

## 2018-01-12 DIAGNOSIS — I5021 Acute systolic (congestive) heart failure: Secondary | ICD-10-CM | POA: Diagnosis not present

## 2018-01-12 DIAGNOSIS — J449 Chronic obstructive pulmonary disease, unspecified: Secondary | ICD-10-CM | POA: Diagnosis not present

## 2018-01-17 ENCOUNTER — Other Ambulatory Visit: Payer: Self-pay | Admitting: Cardiology

## 2018-01-24 DIAGNOSIS — R69 Illness, unspecified: Secondary | ICD-10-CM | POA: Diagnosis not present

## 2018-01-27 DIAGNOSIS — R69 Illness, unspecified: Secondary | ICD-10-CM | POA: Diagnosis not present

## 2018-01-28 DIAGNOSIS — R69 Illness, unspecified: Secondary | ICD-10-CM | POA: Diagnosis not present

## 2018-02-07 DIAGNOSIS — R69 Illness, unspecified: Secondary | ICD-10-CM | POA: Diagnosis not present

## 2018-02-12 DIAGNOSIS — J449 Chronic obstructive pulmonary disease, unspecified: Secondary | ICD-10-CM | POA: Diagnosis not present

## 2018-02-12 DIAGNOSIS — I5021 Acute systolic (congestive) heart failure: Secondary | ICD-10-CM | POA: Diagnosis not present

## 2018-02-25 DIAGNOSIS — R69 Illness, unspecified: Secondary | ICD-10-CM | POA: Diagnosis not present

## 2018-02-26 ENCOUNTER — Ambulatory Visit: Payer: Medicare HMO | Admitting: Cardiology

## 2018-02-26 ENCOUNTER — Encounter: Payer: Self-pay | Admitting: Cardiology

## 2018-02-26 VITALS — BP 100/58 | HR 67 | Ht 66.0 in | Wt 126.4 lb

## 2018-02-26 DIAGNOSIS — I5022 Chronic systolic (congestive) heart failure: Secondary | ICD-10-CM

## 2018-02-26 DIAGNOSIS — I6523 Occlusion and stenosis of bilateral carotid arteries: Secondary | ICD-10-CM

## 2018-02-26 DIAGNOSIS — I2510 Atherosclerotic heart disease of native coronary artery without angina pectoris: Secondary | ICD-10-CM | POA: Diagnosis not present

## 2018-02-26 NOTE — Patient Instructions (Signed)
Your physician [facility] you [facility]: [facility] You will receive a reminder letter in the mail two months in advance. If you don't receive a letter, please call our office to schedule the follow-up appointment.  Your physician recommends that you continue on your current medications as directed. Please refer to the Current Medication list given to you today.  Your physician recommends that you return for lab work CMP/LIPIDS - PLEASE FAST 6-8 HOURS PRIOR TO LABS   Thank you for choosing [facility]!!
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
## 2018-02-28 ENCOUNTER — Other Ambulatory Visit: Payer: Self-pay | Admitting: Cardiology

## 2018-02-28 DIAGNOSIS — I5022 Chronic systolic (congestive) heart failure: Secondary | ICD-10-CM | POA: Diagnosis not present

## 2018-03-01 LAB — COMPREHENSIVE METABOLIC PANEL
ALBUMIN: 4.2 g/dL (ref 3.5–4.8)
ALT: 12 IU/L (ref 0–44)
AST: 10 IU/L (ref 0–40)
Albumin/Globulin Ratio: 2.1 (ref 1.2–2.2)
Alkaline Phosphatase: 54 IU/L (ref 39–117)
BILIRUBIN TOTAL: 0.3 mg/dL (ref 0.0–1.2)
BUN/Creatinine Ratio: 14 (ref 10–24)
BUN: 11 mg/dL (ref 8–27)
CALCIUM: 9.4 mg/dL (ref 8.6–10.2)
CHLORIDE: 101 mmol/L (ref 96–106)
CO2: 26 mmol/L (ref 20–29)
Creatinine, Ser: 0.81 mg/dL (ref 0.76–1.27)
GFR calc Af Amer: 103 mL/min/{1.73_m2} (ref >59)
GFR, EST NON AFRICAN AMERICAN: 89 mL/min/{1.73_m2} (ref >59)
Globulin, Total: 2 g/dL (ref 1.5–4.5)
Glucose: 98 mg/dL (ref 65–99)
POTASSIUM: 4.7 mmol/L (ref 3.5–5.2)
Sodium: 138 mmol/L (ref 134–144)
TOTAL PROTEIN: 6.2 g/dL (ref 6.0–8.5)

## 2018-03-01 LAB — LIPID PANEL W/O CHOL/HDL RATIO
Cholesterol, Total: 111 mg/dL (ref 100–199)
HDL: 42 mg/dL (ref >39)
LDL Calculated: 57 mg/dL (ref 0–99)
Triglycerides: 62 mg/dL (ref 0–149)
VLDL Cholesterol Cal: 12 mg/dL (ref 5–40)

## 2018-03-01 LAB — SPECIMEN STATUS REPORT

## 2018-03-03 DIAGNOSIS — R69 Illness, unspecified: Secondary | ICD-10-CM | POA: Diagnosis not present

## 2018-03-12 ENCOUNTER — Encounter: Payer: Self-pay | Admitting: *Deleted

## 2018-03-12 ENCOUNTER — Ambulatory Visit: Payer: Medicare HMO | Admitting: Gastroenterology

## 2018-03-12 ENCOUNTER — Telehealth: Payer: Self-pay | Admitting: *Deleted

## 2018-03-12 ENCOUNTER — Encounter: Payer: Self-pay | Admitting: Gastroenterology

## 2018-03-12 ENCOUNTER — Other Ambulatory Visit: Payer: Self-pay | Admitting: *Deleted

## 2018-03-12 VITALS — BP 107/67 | HR 71 | Temp 97.0°F | Ht 66.0 in | Wt 125.4 lb

## 2018-03-12 DIAGNOSIS — R109 Unspecified abdominal pain: Secondary | ICD-10-CM

## 2018-03-12 DIAGNOSIS — K5900 Constipation, unspecified: Secondary | ICD-10-CM

## 2018-03-12 DIAGNOSIS — R1084 Generalized abdominal pain: Secondary | ICD-10-CM

## 2018-03-12 NOTE — Assessment & Plan Note (Signed)
Noting constant pain at level of umbilicus, worsened after eating. Although weight is stable, I would like to pursue CT with angiography to ensure we are not dealing with chronic mesenteric ischemia. Recent EGD on file, and colonoscopy is up-to-date. Recent LFTs normal. Creatinine normal. Doubt dealing with biliary etiology. CTA planned.

## 2018-03-12 NOTE — Assessment & Plan Note (Signed)
Miralax without improvement. Previously tried Linzess and Amitiza but stated this caused "griping" of abdomen. Constipation playing a role with worsening abdominal pain. Will trial Linzess 290 mcg again. I suspect we may need to titrate this in the future, but patient and wife would like to start aggressively. Return in 3 months. Call with update regarding Linzess.

## 2018-03-12 NOTE — Telephone Encounter (Signed)
Records faxed.

## 2018-03-12 NOTE — Patient Instructions (Addendum)
Resume taking omeprazole once each morning, 30 minutes before breakfast.   We have scheduled the CT scan in the near future.   I have given you samples of Linzess to take once each morning, 30 minutes before breakfast. This may cause loose stool for 4-5 days. If it does not get better, we need to adjust the dosage.  We will see you in 3 months or sooner if needed!  I enjoyed seeing you again today! As you know, I value our relationship and want to provide genuine, compassionate, and quality care. I welcome your feedback. If you receive a survey regarding your visit,  I greatly appreciate you taking time to fill this out. See you next time!  Gelene MinkAnna W. Boone, PhD, ANP-BC [facility]

## 2018-03-12 NOTE — Telephone Encounter (Signed)
Ct Angio is pending via [employer] website. Need to fax clinicals. Service order# [id number]. AB is aware and will let me know once OV is completed to fax.
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
## 2018-03-13 ENCOUNTER — Encounter: Payer: Self-pay | Admitting: Internal Medicine

## 2018-03-13 NOTE — Progress Notes (Signed)
cc'ed to pcp °

## 2018-03-14 DIAGNOSIS — I5021 Acute systolic (congestive) heart failure: Secondary | ICD-10-CM | POA: Diagnosis not present

## 2018-03-14 DIAGNOSIS — J449 Chronic obstructive pulmonary disease, unspecified: Secondary | ICD-10-CM | POA: Diagnosis not present

## 2018-03-14 NOTE — Telephone Encounter (Signed)
PA still pending.  

## 2018-03-14 NOTE — Telephone Encounter (Signed)
CTA abd/pelvis was approved via [employer] website. NWGN#[id number]#[id number] dates 03/14/18-06/12/18

## 2018-03-18 ENCOUNTER — Ambulatory Visit (HOSPITAL_COMMUNITY)
Admission: RE | Admit: 2018-03-18 | Discharge: 2018-03-18 | Disposition: A | Discharge status: Home / Self Care | Payer: Medicare HMO | Source: Ambulatory Visit | Attending: Gastroenterology | Admitting: Gastroenterology

## 2018-03-18 DIAGNOSIS — R109 Unspecified abdominal pain: Secondary | ICD-10-CM | POA: Insufficient documentation

## 2018-03-18 DIAGNOSIS — I714 Abdominal aortic aneurysm, without rupture: Secondary | ICD-10-CM | POA: Diagnosis not present

## 2018-03-18 MED ORDER — IOPAMIDOL (ISOVUE-370) INJECTION 76%
100.0000 mL | Freq: Once | INTRAVENOUS | Status: AC | PRN
  Administered 2018-03-18: 100 mL via INTRAVENOUS

## 2018-03-21 DIAGNOSIS — R69 Illness, unspecified: Secondary | ICD-10-CM | POA: Diagnosis not present

## 2018-03-24 ENCOUNTER — Telehealth: Payer: Self-pay | Admitting: Internal Medicine

## 2018-03-24 MED ORDER — LINACLOTIDE 290 MCG PO CAPS: 290.0000 ug | ORAL_CAPSULE | Freq: Every day | ORAL | 3 refills | Status: DC

## 2018-03-24 NOTE — Telephone Encounter (Signed)
[facility] [place]  [phone]

## 2018-03-24 NOTE — Addendum Note (Signed)
Addended by: Gelene MinkBOONE, Marisa Hufstetler W on: 03/24/2018 01:31 PM   Modules accepted: Orders

## 2018-03-24 NOTE — Telephone Encounter (Signed)
Completed.

## 2018-03-24 NOTE — Progress Notes (Signed)
CTA without evidence for chronic mesenteric ischemia. 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 Abdominal aortic aneurysm noted that will need repeat ultrasound in 3 years. Keep follow-up appt upcoming.

## 2018-03-24 NOTE — Telephone Encounter (Signed)
Pt would like Linzess 290 mcg sent to his pharmacy. Samples worked well.

## 2018-03-31 ENCOUNTER — Telehealth: Payer: Self-pay | Admitting: *Deleted

## 2018-03-31 ENCOUNTER — Telehealth: Payer: Self-pay | Admitting: Cardiology

## 2018-03-31 NOTE — Telephone Encounter (Signed)
Patient's wife called stating that the ENTRESTO 24-26 MG  $300.00 .   They cannot afford to pay for this medication .

## 2018-03-31 NOTE — Telephone Encounter (Signed)
Received a call from spouse stating the linzess is going to cost $300. Can't afford this.

## 2018-03-31 NOTE — Telephone Encounter (Signed)
Pt will apply for patient assistance - has not ran out of medication at this time - aware to call us prior to running out so that we can provide samples until pt assistance application is processed

## 2018-04-01 NOTE — Telephone Encounter (Signed)
Spoke to spouse, pt is going to apply for pt assistance. Papers mailed to pt, samples are ready for pickup. Another option was for spouse to call their insurance plan to see what else is covered under their plan.

## 2018-04-04 ENCOUNTER — Telehealth: Payer: Self-pay | Admitting: Cardiology

## 2018-04-04 DIAGNOSIS — J9611 Chronic respiratory failure with hypoxia: Secondary | ICD-10-CM | POA: Diagnosis not present

## 2018-04-04 DIAGNOSIS — I4891 Unspecified atrial fibrillation: Secondary | ICD-10-CM | POA: Diagnosis not present

## 2018-04-04 DIAGNOSIS — J439 Emphysema, unspecified: Secondary | ICD-10-CM | POA: Diagnosis not present

## 2018-04-04 DIAGNOSIS — R69 Illness, unspecified: Secondary | ICD-10-CM | POA: Diagnosis not present

## 2018-04-04 DIAGNOSIS — I509 Heart failure, unspecified: Secondary | ICD-10-CM | POA: Diagnosis not present

## 2018-04-04 DIAGNOSIS — I110 Hypertensive heart disease with heart failure: Secondary | ICD-10-CM | POA: Diagnosis not present

## 2018-04-04 DIAGNOSIS — I2510 Atherosclerotic heart disease of native coronary artery without angina pectoris: Secondary | ICD-10-CM | POA: Diagnosis not present

## 2018-04-04 DIAGNOSIS — E785 Hyperlipidemia, unspecified: Secondary | ICD-10-CM | POA: Diagnosis not present

## 2018-04-04 DIAGNOSIS — E46 Unspecified protein-calorie malnutrition: Secondary | ICD-10-CM | POA: Diagnosis not present

## 2018-04-04 MED ORDER — SACUBITRIL-VALSARTAN 24-26 MG PO TABS: 1.0000 | ORAL_TABLET | Freq: Two times a day (BID) | ORAL | 0 refills | Status: DC

## 2018-04-04 NOTE — Telephone Encounter (Signed)
Fwd to Dr. Branch nurse. 

## 2018-04-04 NOTE — Telephone Encounter (Signed)
Wife Natasha Mead) came by office to pick up samples.  Informed her of the insurance information.  She verbalized understanding and stated that she can pay that amount to cover the deductible.

## 2018-04-04 NOTE — Telephone Encounter (Signed)
Patient can not afford medication

## 2018-04-04 NOTE — Telephone Encounter (Signed)
[name]([name]) states they can not afford his Entresto.  Co-pay is 300.00.    Call placed to [facility] [employer] - patient does have a deductible plan.  His deductible is $250.00 and the co-pay is $47.00.  The total cost of that is $297.00 and that is what pharmacy is requesting.  After meeting this deductible, his co-pay afterwards will be $47.00 every 30 days.  He could also try doing a Tiering exception as this medication is a Tier 3.  Samples also provided.

## 2018-04-06 DIAGNOSIS — R69 Illness, unspecified: Secondary | ICD-10-CM | POA: Diagnosis not present

## 2018-04-10 DIAGNOSIS — R69 Illness, unspecified: Secondary | ICD-10-CM | POA: Diagnosis not present

## 2018-04-11 ENCOUNTER — Ambulatory Visit: Payer: Medicare HMO | Admitting: Gastroenterology

## 2018-04-14 DIAGNOSIS — J449 Chronic obstructive pulmonary disease, unspecified: Secondary | ICD-10-CM | POA: Diagnosis not present

## 2018-04-14 DIAGNOSIS — I5021 Acute systolic (congestive) heart failure: Secondary | ICD-10-CM | POA: Diagnosis not present

## 2018-04-25 DIAGNOSIS — Z6820 Body mass index (BMI) 20.0-20.9, adult: Secondary | ICD-10-CM | POA: Diagnosis not present

## 2018-04-25 DIAGNOSIS — R1013 Epigastric pain: Secondary | ICD-10-CM | POA: Diagnosis not present

## 2018-04-25 DIAGNOSIS — Z1389 Encounter for screening for other disorder: Secondary | ICD-10-CM | POA: Diagnosis not present

## 2018-04-27 ENCOUNTER — Emergency Department (HOSPITAL_COMMUNITY): Payer: Medicare HMO

## 2018-04-27 ENCOUNTER — Encounter (HOSPITAL_COMMUNITY): Payer: Self-pay | Admitting: Emergency Medicine

## 2018-04-27 ENCOUNTER — Other Ambulatory Visit: Payer: Self-pay

## 2018-04-27 ENCOUNTER — Emergency Department (HOSPITAL_COMMUNITY)
Admission: EM | Admit: 2018-04-27 | Discharge: 2018-04-28 | Disposition: A | Discharge status: Home / Self Care | Payer: Medicare HMO | Attending: Emergency Medicine | Admitting: Emergency Medicine

## 2018-04-27 DIAGNOSIS — R509 Fever, unspecified: Secondary | ICD-10-CM | POA: Diagnosis not present

## 2018-04-27 DIAGNOSIS — J45909 Unspecified asthma, uncomplicated: Secondary | ICD-10-CM | POA: Insufficient documentation

## 2018-04-27 DIAGNOSIS — I110 Hypertensive heart disease with heart failure: Secondary | ICD-10-CM | POA: Insufficient documentation

## 2018-04-27 DIAGNOSIS — I2510 Atherosclerotic heart disease of native coronary artery without angina pectoris: Secondary | ICD-10-CM | POA: Diagnosis not present

## 2018-04-27 DIAGNOSIS — R0602 Shortness of breath: Secondary | ICD-10-CM | POA: Diagnosis not present

## 2018-04-27 DIAGNOSIS — I5022 Chronic systolic (congestive) heart failure: Secondary | ICD-10-CM | POA: Diagnosis not present

## 2018-04-27 DIAGNOSIS — F17210 Nicotine dependence, cigarettes, uncomplicated: Secondary | ICD-10-CM | POA: Insufficient documentation

## 2018-04-27 DIAGNOSIS — I252 Old myocardial infarction: Secondary | ICD-10-CM | POA: Diagnosis not present

## 2018-04-27 DIAGNOSIS — R69 Illness, unspecified: Secondary | ICD-10-CM | POA: Diagnosis not present

## 2018-04-27 DIAGNOSIS — R109 Unspecified abdominal pain: Secondary | ICD-10-CM | POA: Insufficient documentation

## 2018-04-27 DIAGNOSIS — R9431 Abnormal electrocardiogram [ECG] [EKG]: Secondary | ICD-10-CM | POA: Diagnosis not present

## 2018-04-27 DIAGNOSIS — R1084 Generalized abdominal pain: Secondary | ICD-10-CM | POA: Diagnosis not present

## 2018-04-27 LAB — COMPREHENSIVE METABOLIC PANEL
ALT: 18 U/L (ref 0–44)
ANION GAP: 7 (ref 5–15)
AST: 15 U/L (ref 15–41)
Albumin: 3.7 g/dL (ref 3.5–5.0)
Alkaline Phosphatase: 44 U/L (ref 38–126)
BILIRUBIN TOTAL: 0.4 mg/dL (ref 0.3–1.2)
BUN: 19 mg/dL (ref 8–23)
CHLORIDE: 101 mmol/L (ref 98–111)
CO2: 25 mmol/L (ref 22–32)
Calcium: 9.1 mg/dL (ref 8.9–10.3)
Creatinine, Ser: 0.8 mg/dL (ref 0.61–1.24)
GFR calc Af Amer: >60 mL/min (ref >60)
GFR calc non Af Amer: >60 mL/min (ref >60)
Glucose, Bld: 120 mg/dL — ABNORMAL HIGH (ref 70–99)
POTASSIUM: 3.9 mmol/L (ref 3.5–5.1)
SODIUM: 133 mmol/L — AB (ref 135–145)
TOTAL PROTEIN: 6.4 g/dL — AB (ref 6.5–8.1)

## 2018-04-27 LAB — CBC
HCT: 37 % — ABNORMAL LOW (ref 39.0–52.0)
HEMOGLOBIN: 12 g/dL — AB (ref 13.0–17.0)
MCH: 31.9 pg (ref 26.0–34.0)
MCHC: 32.4 g/dL (ref 30.0–36.0)
MCV: 98.4 fL (ref 80.0–100.0)
Platelets: 279 10*3/uL (ref 150–400)
RBC: 3.76 MIL/uL — AB (ref 4.22–5.81)
RDW: 13.4 % (ref 11.5–15.5)
WBC: 16.8 10*3/uL — ABNORMAL HIGH (ref 4.0–10.5)
nRBC: 0 % (ref 0.0–0.2)

## 2018-04-27 LAB — LIPASE, BLOOD: LIPASE: 24 U/L (ref 11–51)

## 2018-04-27 LAB — INFLUENZA PANEL BY PCR (TYPE A & B)
INFLAPCR: NEGATIVE
Influenza B By PCR: NEGATIVE

## 2018-04-27 LAB — LACTIC ACID, PLASMA: Lactic Acid, Venous: 0.8 mmol/L (ref 0.5–1.9)

## 2018-04-27 LAB — TROPONIN I

## 2018-04-27 MED ORDER — IOPAMIDOL (ISOVUE-300) INJECTION 61%
100.0000 mL | Freq: Once | INTRAVENOUS | Status: AC | PRN
  Administered 2018-04-27: 100 mL via INTRAVENOUS

## 2018-04-27 MED ORDER — SODIUM CHLORIDE 0.9 % IV BOLUS
1000.0000 mL | Freq: Once | INTRAVENOUS | Status: AC
  Administered 2018-04-27: 1000 mL via INTRAVENOUS

## 2018-04-27 MED ORDER — ACETAMINOPHEN 500 MG PO TABS
1000.0000 mg | ORAL_TABLET | Freq: Once | ORAL | Status: AC
  Administered 2018-04-27: 1000 mg via ORAL
  Filled 2018-04-27: qty 2
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
## 2018-04-27 NOTE — ED Triage Notes (Signed)
Fever highest 103.1  At home, given Tylenol 2 pm, 3 to 4 loose stools last night.

## 2018-04-27 NOTE — ED Provider Notes (Signed)
  Physical Exam  BP 117/64 (BP Location: Right Arm)   Pulse 91   Temp (!) 101.7 F (38.7 C) (Oral)   Resp 19   Ht 5\' 6"  (1.676 m)   Wt 53.1 kg   SpO2 97%   BMI 18.88 kg/m   Physical Exam  ED Course/Procedures     Procedures  MDM   Assuming care of patient from Dr. Jacqulyn Bath.  Patient in the ED for abdominal discomfort and he is noted to have a fever. Workup thus far shows no acute findings. Pt has multiple medical comorbidities.   Concerning findings are as following: none Important pending results are CT abdomen and pelvis.  According to Dr. Jacqulyn Bath , plan is to f/u on CT. If Ct is neg, pt should be able to go home. Patient is chronically ill.  Patient had no complains, no concerns from the nursing side. Will continue to monitor.  Clinically not SEPTIC.   Derwood Kaplan, MD 04/27/18 2350

## 2018-04-28 DIAGNOSIS — J45909 Unspecified asthma, uncomplicated: Secondary | ICD-10-CM | POA: Diagnosis not present

## 2018-04-28 DIAGNOSIS — R69 Illness, unspecified: Secondary | ICD-10-CM | POA: Diagnosis not present

## 2018-04-28 DIAGNOSIS — R109 Unspecified abdominal pain: Secondary | ICD-10-CM | POA: Diagnosis not present

## 2018-04-28 DIAGNOSIS — I110 Hypertensive heart disease with heart failure: Secondary | ICD-10-CM | POA: Diagnosis not present

## 2018-04-28 DIAGNOSIS — I2510 Atherosclerotic heart disease of native coronary artery without angina pectoris: Secondary | ICD-10-CM | POA: Diagnosis not present

## 2018-04-28 DIAGNOSIS — R509 Fever, unspecified: Secondary | ICD-10-CM | POA: Diagnosis not present

## 2018-04-28 DIAGNOSIS — I5022 Chronic systolic (congestive) heart failure: Secondary | ICD-10-CM | POA: Diagnosis not present

## 2018-04-28 DIAGNOSIS — I252 Old myocardial infarction: Secondary | ICD-10-CM | POA: Diagnosis not present

## 2018-04-28 LAB — URINALYSIS, ROUTINE W REFLEX MICROSCOPIC
BILIRUBIN URINE: NEGATIVE
GLUCOSE, UA: NEGATIVE mg/dL
KETONES UR: 20 mg/dL — AB
LEUKOCYTES UA: NEGATIVE
NITRITE: NEGATIVE
PROTEIN: NEGATIVE mg/dL
Specific Gravity, Urine: 1.012 (ref 1.005–1.030)
pH: 6 (ref 5.0–8.0)

## 2018-04-28 LAB — LACTIC ACID, PLASMA: LACTIC ACID, VENOUS: 0.7 mmol/L (ref 0.5–1.9)

## 2018-04-28 NOTE — Discharge Instructions (Signed)
We saw you in the ER for the abdominal pain and fever. We are not sure what is causing your discomfort, but we feel comfortable sending you home at this time. The workup in the ER is not complete, and you should follow up with your primary care doctor for further evaluation.  Please return to the ER if your symptoms worsen; you have increased pain, fevers, chills, inability to keep any medications down, confusion, bloody stools or urine. Otherwise see the outpatient doctor in 2-3 days.

## 2018-04-30 LAB — URINE CULTURE

## 2018-05-01 ENCOUNTER — Telehealth: Payer: Self-pay

## 2018-05-01 DIAGNOSIS — R69 Illness, unspecified: Secondary | ICD-10-CM | POA: Diagnosis not present

## 2018-05-01 NOTE — Telephone Encounter (Signed)
Post ED Visit - Positive Culture Follow-up  Culture report reviewed by antimicrobial stewardship pharmacist:  []  Enzo Bi, Pharm.D. []  Celedonio Miyamoto, [address].D., BCPS AQ-ID []  Garvin Fila, Pharm.D., BCPS []  Georgina Pillion, Pharm.D., BCPS []  [place], [address].D., BCPS, AAHIVP []  Estella Husk, Pharm.D., BCPS, AAHIVP []  Lysle Pearl, PharmD, BCPS []  Phillips Climes, PharmD, BCPS []  Agapito Games, PharmD, BCPS []  Verlan Friends, PharmD H Francoise Schaumann Pharm D Positive urine culture  and no further patient follow-up is required at this time.  Jerry Caras 05/01/2018, 9:38 AM

## 2018-05-01 NOTE — Progress Notes (Signed)
ED Antimicrobial Stewardship Positive Culture Follow Up   Devin Barton is an 73 y.o. male who presented to [facility] on 04/27/2018 with a chief complaint of  Chief Complaint  Patient presents with  . Fever  . Abdominal Pain    Recent Results (from the past 720 hour(s))  Culture, blood (routine x 2)     Status: None (Preliminary result)   Collection Time: 04/27/18  9:30 PM  Result Value Ref Range Status   Specimen Description LEFT ANTECUBITAL  Final   Special Requests   Final    BOTTLES DRAWN AEROBIC AND ANAEROBIC Blood Culture results may not be optimal due to an excessive volume of blood received in culture bottles   Culture   Final    NO GROWTH 4 DAYS Performed at [facility], [address], [place], [place] [address]    Report Status PENDING  Incomplete  Culture, blood (routine x 2)     Status: None (Preliminary result)   Collection Time: 04/27/18  9:40 PM  Result Value Ref Range Status   Specimen Description BLOOD RIGHT ARM  Final   Special Requests   Final    BOTTLES DRAWN AEROBIC AND ANAEROBIC Blood Culture adequate volume   Culture   Final    NO GROWTH 4 DAYS Performed at [facility], [address] [place], [place] [address]    Report Status PENDING  Incomplete  Urine culture     Status: Abnormal   Collection Time: 04/27/18 11:40 PM  Result Value Ref Range Status   Specimen Description   Final    URINE, CLEAN CATCH Performed at [facility], [address] [place], [place] [address]    Special Requests   Final    NONE Performed at [facility], [address], [place], [place] [address]    Culture 20,000 COLONIES/mL KLEBSIELLA OXYTOCA (A)  Final   Report Status 04/30/2018 FINAL  Final   Organism ID, Bacteria KLEBSIELLA OXYTOCA (A)  Final      Susceptibility   Klebsiella oxytoca - MIC*    AMPICILLIN >=32 RESISTANT Resistant     CEFAZOLIN 16 SENSITIVE Sensitive     CEFTRIAXONE <=1 SENSITIVE Sensitive     CIPROFLOXACIN <=0.25 SENSITIVE Sensitive    GENTAMICIN <=1 SENSITIVE Sensitive     IMIPENEM <=0.25 SENSITIVE Sensitive     NITROFURANTOIN 32 SENSITIVE Sensitive     TRIMETH/SULFA <=20 SENSITIVE Sensitive     AMPICILLIN/SULBACTAM 16 INTERMEDIATE Intermediate     PIP/TAZO <=4 SENSITIVE Sensitive     Extended ESBL NEGATIVE Sensitive     * 20,000 COLONIES/mL KLEBSIELLA OXYTOCA   [x]  Patient discharged originally without antimicrobial agent  New antibiotic prescription: No further treatment indicated for asymptomatic bacteriuria.  ED Provider: Claude Manges, PA-C   Almon Hercules 05/01/2018, 7:49 AM Clinical Pharmacist Monday - Friday phone -  [phone] Saturday - Sunday phone - [phone]

## 2018-05-02 LAB — CULTURE, BLOOD (ROUTINE X 2)
Culture: NO GROWTH
Culture: NO GROWTH
Special Requests: ADEQUATE

## 2018-05-06 DIAGNOSIS — K4090 Unilateral inguinal hernia, without obstruction or gangrene, not specified as recurrent: Secondary | ICD-10-CM | POA: Diagnosis not present

## 2018-05-06 DIAGNOSIS — Z681 Body mass index (BMI) 19 or less, adult: Secondary | ICD-10-CM | POA: Diagnosis not present

## 2018-05-15 DIAGNOSIS — I5021 Acute systolic (congestive) heart failure: Secondary | ICD-10-CM | POA: Diagnosis not present

## 2018-05-15 DIAGNOSIS — J449 Chronic obstructive pulmonary disease, unspecified: Secondary | ICD-10-CM | POA: Diagnosis not present

## 2018-05-16 DIAGNOSIS — R69 Illness, unspecified: Secondary | ICD-10-CM | POA: Diagnosis not present

## 2018-05-27 DIAGNOSIS — R69 Illness, unspecified: Secondary | ICD-10-CM | POA: Diagnosis not present

## 2018-05-31 ENCOUNTER — Other Ambulatory Visit: Payer: Self-pay | Admitting: Cardiology

## 2018-06-05 DIAGNOSIS — R69 Illness, unspecified: Secondary | ICD-10-CM | POA: Diagnosis not present

## 2018-06-12 ENCOUNTER — Ambulatory Visit: Payer: Medicare HMO | Admitting: Gastroenterology

## 2018-06-13 DIAGNOSIS — I5021 Acute systolic (congestive) heart failure: Secondary | ICD-10-CM | POA: Diagnosis not present

## 2018-06-13 DIAGNOSIS — J449 Chronic obstructive pulmonary disease, unspecified: Secondary | ICD-10-CM | POA: Diagnosis not present

## 2018-06-26 DIAGNOSIS — R69 Illness, unspecified: Secondary | ICD-10-CM | POA: Diagnosis not present

## 2018-07-14 ENCOUNTER — Other Ambulatory Visit: Payer: Self-pay | Admitting: Cardiology

## 2018-07-14 DIAGNOSIS — I5021 Acute systolic (congestive) heart failure: Secondary | ICD-10-CM | POA: Diagnosis not present

## 2018-07-14 DIAGNOSIS — R69 Illness, unspecified: Secondary | ICD-10-CM | POA: Diagnosis not present

## 2018-07-14 DIAGNOSIS — J449 Chronic obstructive pulmonary disease, unspecified: Secondary | ICD-10-CM | POA: Diagnosis not present

## 2018-07-28 ENCOUNTER — Other Ambulatory Visit: Payer: Self-pay | Admitting: Cardiology

## 2018-07-28 DIAGNOSIS — I6523 Occlusion and stenosis of bilateral carotid arteries: Secondary | ICD-10-CM

## 2018-08-04 DIAGNOSIS — R69 Illness, unspecified: Secondary | ICD-10-CM | POA: Diagnosis not present

## 2018-08-13 DIAGNOSIS — I5021 Acute systolic (congestive) heart failure: Secondary | ICD-10-CM | POA: Diagnosis not present

## 2018-08-13 DIAGNOSIS — J449 Chronic obstructive pulmonary disease, unspecified: Secondary | ICD-10-CM | POA: Diagnosis not present

## 2018-08-16 ENCOUNTER — Other Ambulatory Visit: Payer: Self-pay | Admitting: Cardiology

## 2018-08-22 DIAGNOSIS — R69 Illness, unspecified: Secondary | ICD-10-CM | POA: Diagnosis not present

## 2018-08-25 DIAGNOSIS — R69 Illness, unspecified: Secondary | ICD-10-CM | POA: Diagnosis not present

## 2018-08-26 ENCOUNTER — Telehealth (INDEPENDENT_AMBULATORY_CARE_PROVIDER_SITE_OTHER): Payer: Medicare HMO | Admitting: Cardiology

## 2018-08-26 ENCOUNTER — Telehealth: Payer: Medicare HMO | Admitting: Cardiology

## 2018-08-26 ENCOUNTER — Encounter: Payer: Self-pay | Admitting: Cardiology

## 2018-08-26 VITALS — BP 140/30 | Ht 66.0 in | Wt 127.0 lb

## 2018-08-26 DIAGNOSIS — I5022 Chronic systolic (congestive) heart failure: Secondary | ICD-10-CM

## 2018-08-26 DIAGNOSIS — I2510 Atherosclerotic heart disease of native coronary artery without angina pectoris: Secondary | ICD-10-CM

## 2018-08-26 DIAGNOSIS — I6523 Occlusion and stenosis of bilateral carotid arteries: Secondary | ICD-10-CM

## 2018-08-26 NOTE — Patient Instructions (Signed)
Medication Instructions:  Continue all current medications.  Labwork: none  Testing/Procedures: none  Follow-Up: 4 months   Any Other Special Instructions Will Be Listed Below (If Applicable).  If you need a refill on your cardiac medications before your next appointment, please call your pharmacy.\ 
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
## 2018-09-12 ENCOUNTER — Other Ambulatory Visit: Payer: Self-pay | Admitting: Cardiology

## 2018-09-13 DIAGNOSIS — J449 Chronic obstructive pulmonary disease, unspecified: Secondary | ICD-10-CM | POA: Diagnosis not present

## 2018-09-13 DIAGNOSIS — I5021 Acute systolic (congestive) heart failure: Secondary | ICD-10-CM | POA: Diagnosis not present

## 2018-09-13 IMAGING — DX DG CHEST PORT 1 VIEW
1 series · 1 of 1 positions shown · non-contrast
Comparison: 10/04/2016

CLINICAL DATA: S/P CABG x 4

EXAM:
PORTABLE CHEST - 1 VIEW

[chest ap]
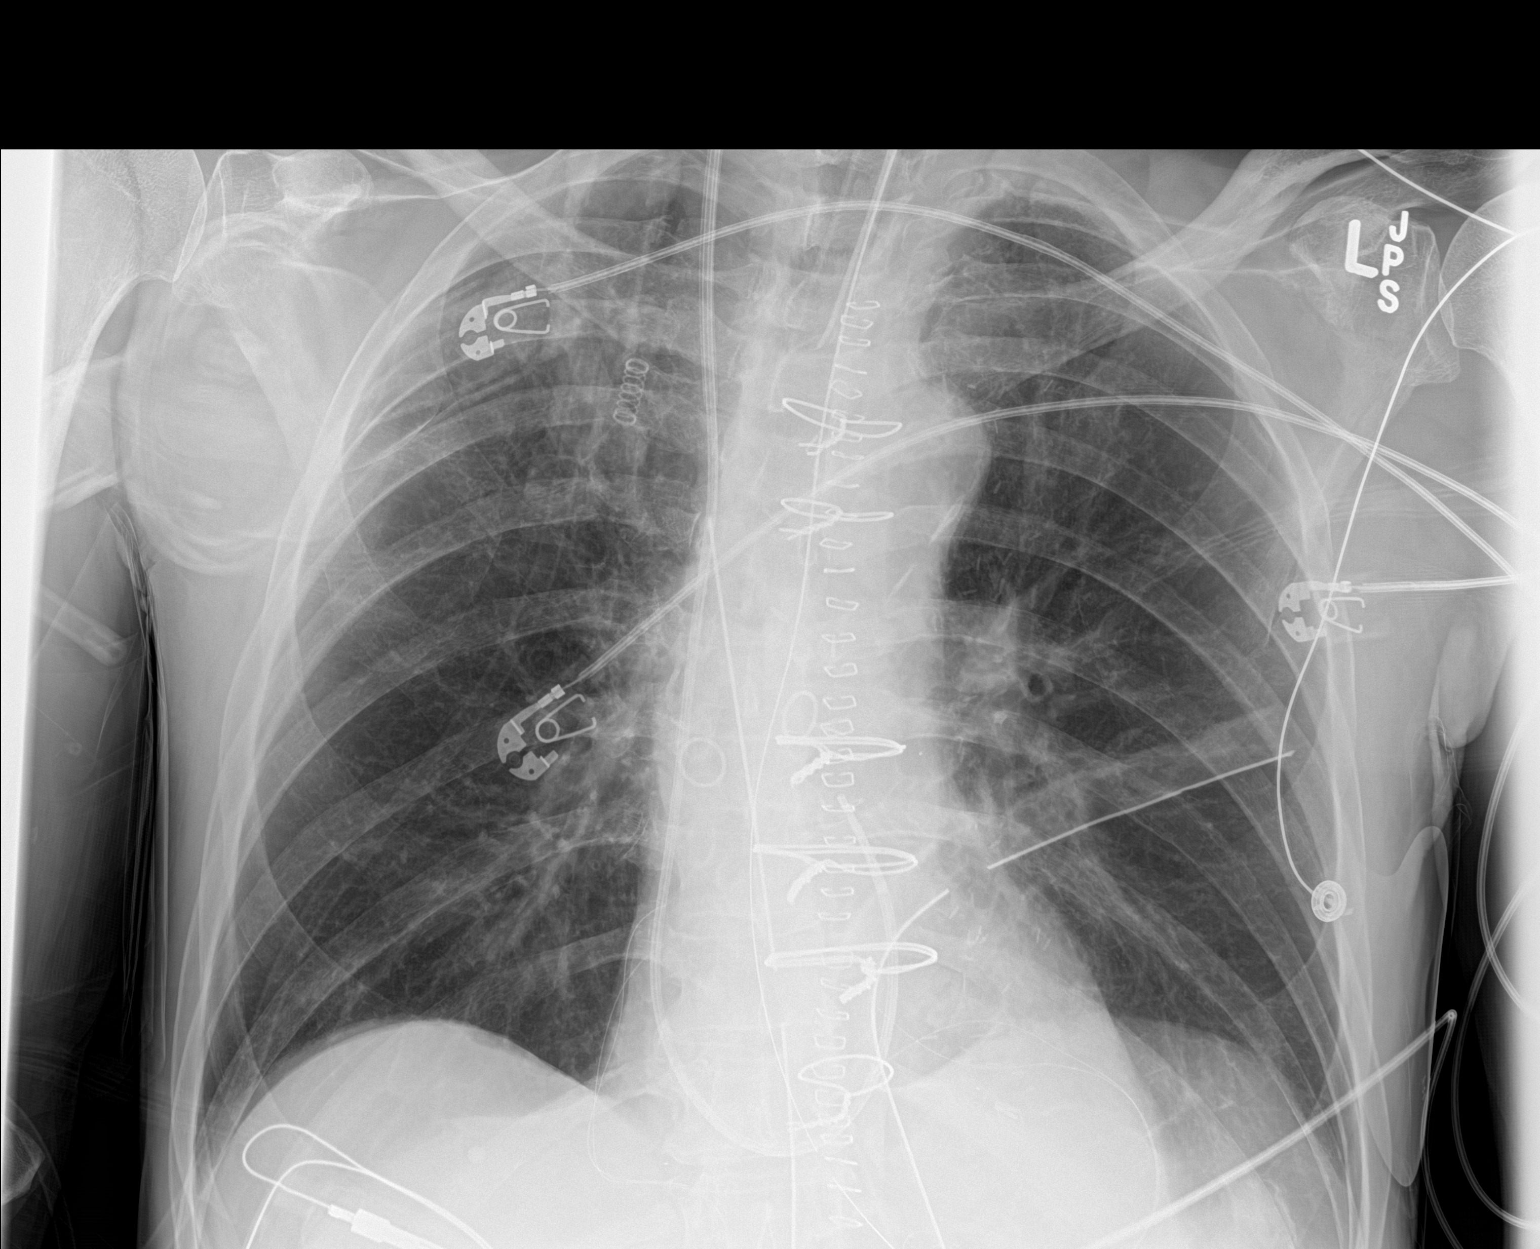

[1 of 1 positions shown; findings below may reference images not displayed]

FINDINGS: Interval median sternotomy and CABG. Endotracheal tube in place with
tip 6.7 cm above carina. Nasogastric tube extends at least as far as
the stomach, tip not seen. Left chest tube placed with no
pneumothorax evident. Right IJ Yiopyos to the proximal pulmonary
artery. Patchy subsegmental atelectasis in the left infrahilar
region. Lungs are otherwise clear.

Heart size and mediastinal contours are within normal limits.
Atheromatous aorta.

No effusion.
IMPRESSION: 1. Interval CABG with support hardware in expected location.
2. No pneumothorax.

## 2018-09-15 DIAGNOSIS — R69 Illness, unspecified: Secondary | ICD-10-CM | POA: Diagnosis not present

## 2018-09-23 IMAGING — DX DG CHEST 2 VIEW
2 series · 2 of 2 positions shown · non-contrast
Comparison: 10/09/2016

CLINICAL DATA: Cough, shortness of breath

EXAM:
CHEST  2 VIEW

[chest pa]
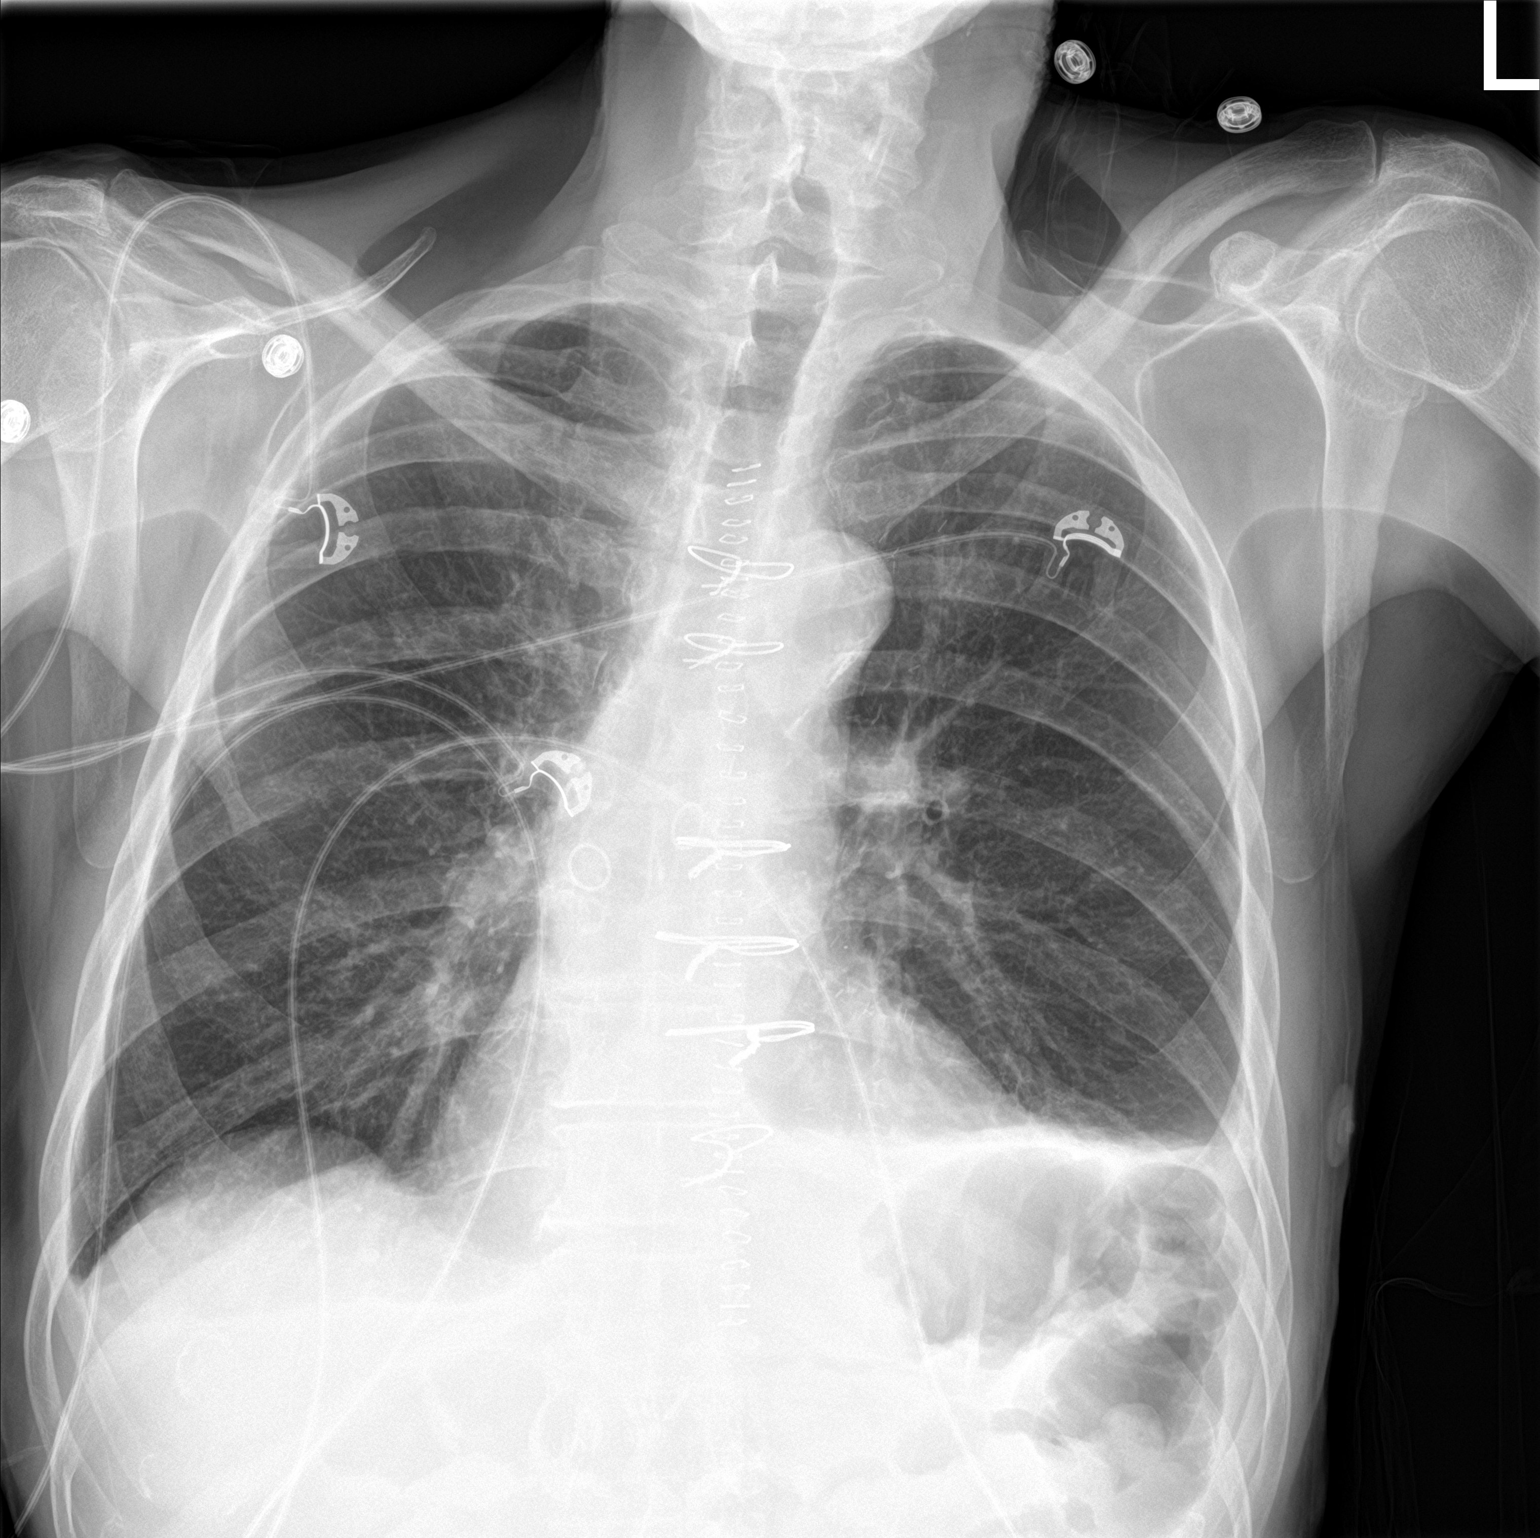

[chest lat]
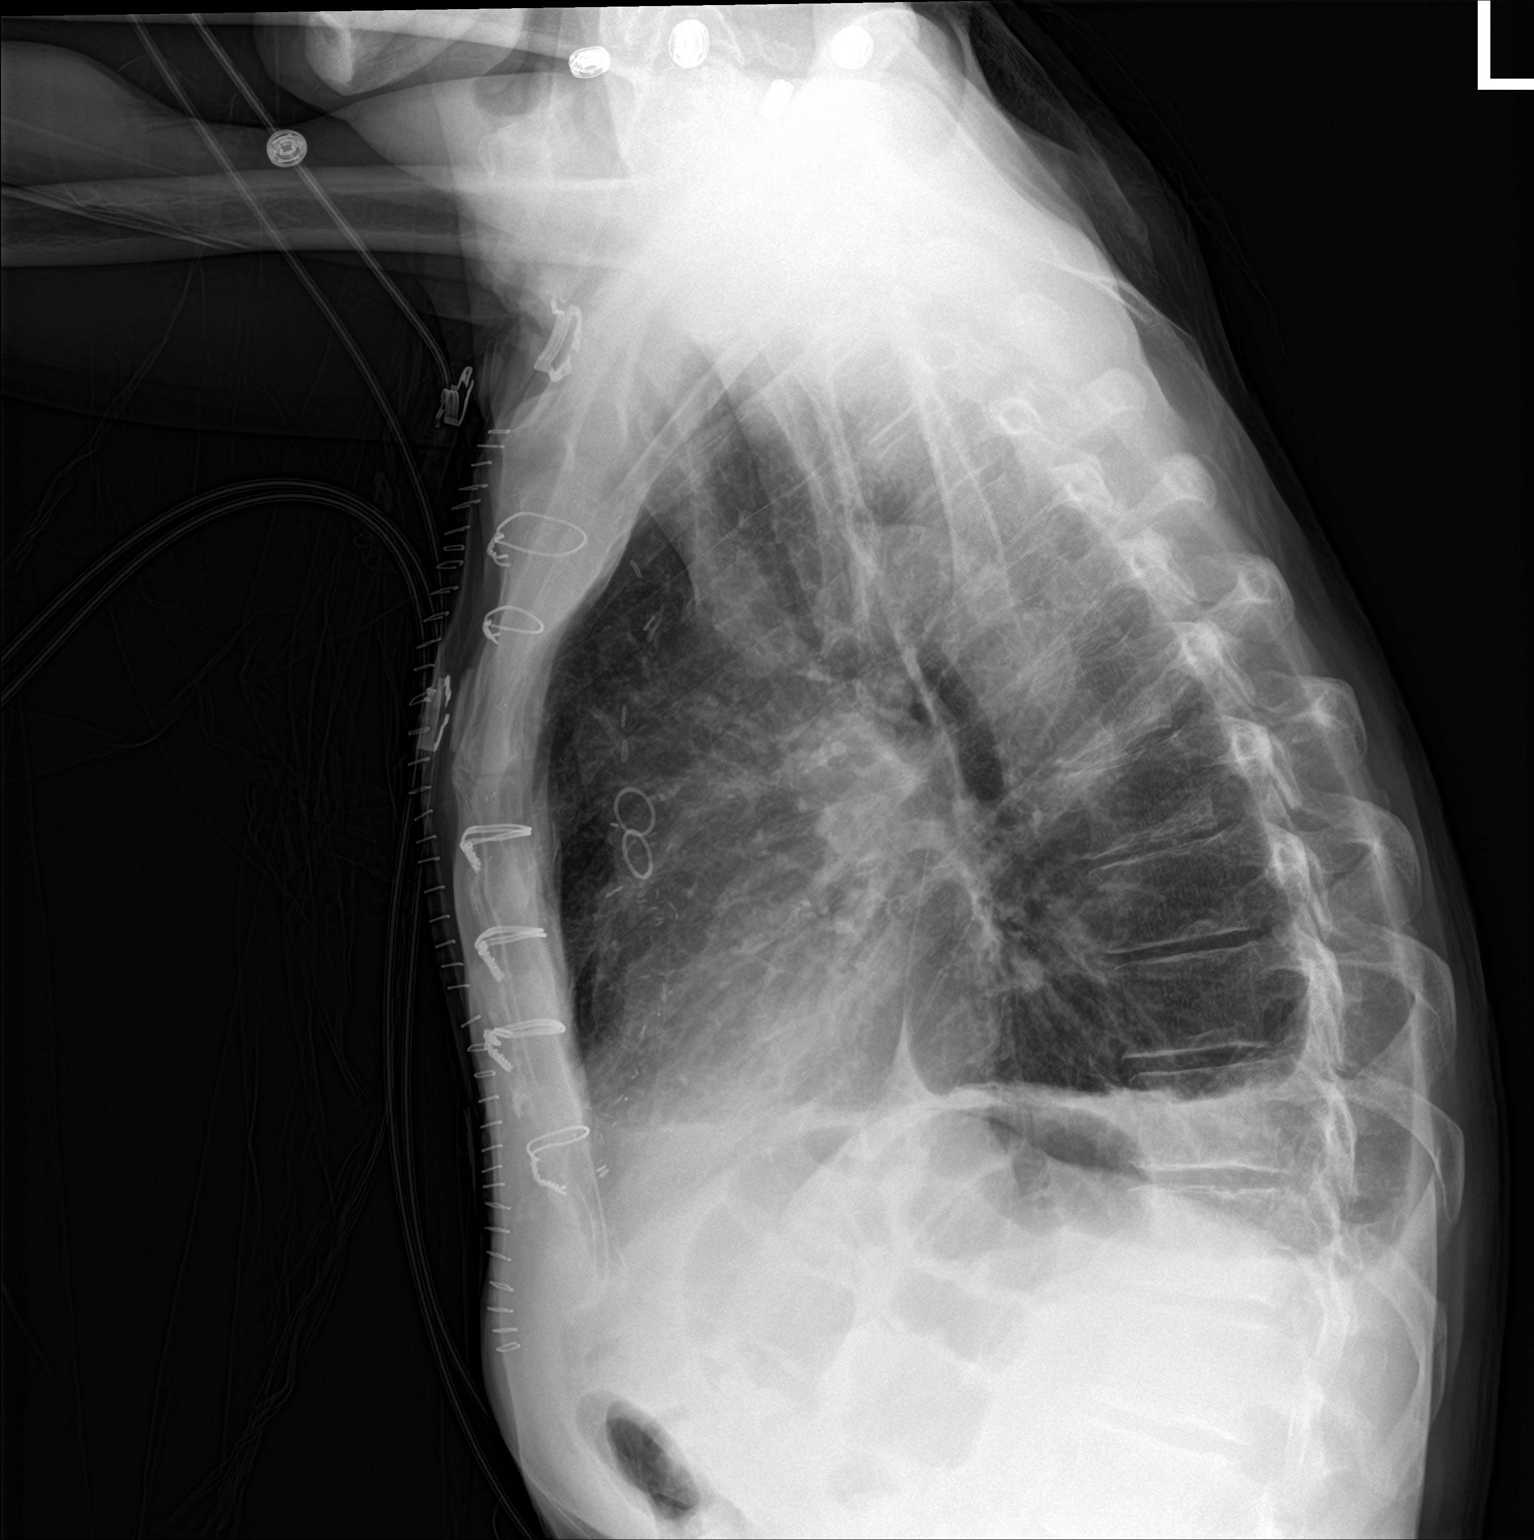

[2 of 2 positions shown; findings below may reference images not displayed]

FINDINGS: Small left and trace right pleural effusions. Mild patchy left lower
lobe opacity, likely atelectasis.

No frank interstitial edema.  No pneumothorax.

The heart is normal in size. Postsurgical changes related to prior
CABG.

Median sternotomy.  Midline skin staples.

Thoracic spine is within normal limits.
IMPRESSION: Small left and trace right pleural effusions.

## 2018-09-29 DIAGNOSIS — R69 Illness, unspecified: Secondary | ICD-10-CM | POA: Diagnosis not present

## 2018-10-03 DIAGNOSIS — Z1389 Encounter for screening for other disorder: Secondary | ICD-10-CM | POA: Diagnosis not present

## 2018-10-03 DIAGNOSIS — R55 Syncope and collapse: Secondary | ICD-10-CM | POA: Diagnosis not present

## 2018-10-03 DIAGNOSIS — J449 Chronic obstructive pulmonary disease, unspecified: Secondary | ICD-10-CM | POA: Diagnosis not present

## 2018-10-03 DIAGNOSIS — R627 Adult failure to thrive: Secondary | ICD-10-CM | POA: Diagnosis not present

## 2018-10-03 DIAGNOSIS — Z681 Body mass index (BMI) 19 or less, adult: Secondary | ICD-10-CM | POA: Diagnosis not present

## 2018-10-09 DIAGNOSIS — R69 Illness, unspecified: Secondary | ICD-10-CM | POA: Diagnosis not present

## 2018-10-10 ENCOUNTER — Encounter (HOSPITAL_COMMUNITY): Payer: Self-pay | Admitting: *Deleted

## 2018-10-10 ENCOUNTER — Observation Stay (HOSPITAL_COMMUNITY)
Admission: EM | Admit: 2018-10-10 | Discharge: 2018-10-12 | Disposition: A | Discharge status: Home / Self Care | Payer: Medicare HMO | Attending: Internal Medicine | Admitting: Internal Medicine

## 2018-10-10 ENCOUNTER — Observation Stay (HOSPITAL_COMMUNITY): Payer: Medicare HMO

## 2018-10-10 ENCOUNTER — Emergency Department (HOSPITAL_COMMUNITY): Payer: Medicare HMO

## 2018-10-10 ENCOUNTER — Other Ambulatory Visit: Payer: Self-pay

## 2018-10-10 DIAGNOSIS — I5042 Chronic combined systolic (congestive) and diastolic (congestive) heart failure: Secondary | ICD-10-CM | POA: Insufficient documentation

## 2018-10-10 DIAGNOSIS — I480 Paroxysmal atrial fibrillation: Secondary | ICD-10-CM | POA: Diagnosis present

## 2018-10-10 DIAGNOSIS — E785 Hyperlipidemia, unspecified: Secondary | ICD-10-CM | POA: Diagnosis not present

## 2018-10-10 DIAGNOSIS — E871 Hypo-osmolality and hyponatremia: Principal | ICD-10-CM | POA: Diagnosis present

## 2018-10-10 DIAGNOSIS — Z03818 Encounter for observation for suspected exposure to other biological agents ruled out: Secondary | ICD-10-CM | POA: Diagnosis not present

## 2018-10-10 DIAGNOSIS — K921 Melena: Secondary | ICD-10-CM | POA: Diagnosis not present

## 2018-10-10 DIAGNOSIS — J45909 Unspecified asthma, uncomplicated: Secondary | ICD-10-CM | POA: Insufficient documentation

## 2018-10-10 DIAGNOSIS — Z951 Presence of aortocoronary bypass graft: Secondary | ICD-10-CM | POA: Diagnosis not present

## 2018-10-10 DIAGNOSIS — J9611 Chronic respiratory failure with hypoxia: Secondary | ICD-10-CM | POA: Diagnosis not present

## 2018-10-10 DIAGNOSIS — G8929 Other chronic pain: Secondary | ICD-10-CM | POA: Diagnosis not present

## 2018-10-10 DIAGNOSIS — K5730 Diverticulosis of large intestine without perforation or abscess without bleeding: Secondary | ICD-10-CM | POA: Diagnosis not present

## 2018-10-10 DIAGNOSIS — Z7982 Long term (current) use of aspirin: Secondary | ICD-10-CM | POA: Diagnosis not present

## 2018-10-10 DIAGNOSIS — R079 Chest pain, unspecified: Secondary | ICD-10-CM | POA: Diagnosis not present

## 2018-10-10 DIAGNOSIS — R072 Precordial pain: Secondary | ICD-10-CM | POA: Diagnosis present

## 2018-10-10 DIAGNOSIS — I110 Hypertensive heart disease with heart failure: Secondary | ICD-10-CM | POA: Diagnosis not present

## 2018-10-10 DIAGNOSIS — I252 Old myocardial infarction: Secondary | ICD-10-CM | POA: Diagnosis not present

## 2018-10-10 DIAGNOSIS — R69 Illness, unspecified: Secondary | ICD-10-CM | POA: Diagnosis not present

## 2018-10-10 DIAGNOSIS — R0789 Other chest pain: Secondary | ICD-10-CM | POA: Insufficient documentation

## 2018-10-10 DIAGNOSIS — D638 Anemia in other chronic diseases classified elsewhere: Secondary | ICD-10-CM | POA: Diagnosis not present

## 2018-10-10 DIAGNOSIS — Z20828 Contact with and (suspected) exposure to other viral communicable diseases: Secondary | ICD-10-CM | POA: Insufficient documentation

## 2018-10-10 DIAGNOSIS — R1084 Generalized abdominal pain: Secondary | ICD-10-CM | POA: Insufficient documentation

## 2018-10-10 DIAGNOSIS — F17210 Nicotine dependence, cigarettes, uncomplicated: Secondary | ICD-10-CM | POA: Diagnosis not present

## 2018-10-10 DIAGNOSIS — J449 Chronic obstructive pulmonary disease, unspecified: Secondary | ICD-10-CM | POA: Diagnosis not present

## 2018-10-10 DIAGNOSIS — I2510 Atherosclerotic heart disease of native coronary artery without angina pectoris: Secondary | ICD-10-CM | POA: Diagnosis not present

## 2018-10-10 DIAGNOSIS — R109 Unspecified abdominal pain: Secondary | ICD-10-CM | POA: Diagnosis present

## 2018-10-10 DIAGNOSIS — Z79899 Other long term (current) drug therapy: Secondary | ICD-10-CM | POA: Diagnosis not present

## 2018-10-10 DIAGNOSIS — R55 Syncope and collapse: Secondary | ICD-10-CM

## 2018-10-10 DIAGNOSIS — S0990XA Unspecified injury of head, initial encounter: Secondary | ICD-10-CM | POA: Diagnosis not present

## 2018-10-10 LAB — CBC WITH DIFFERENTIAL/PLATELET
Abs Immature Granulocytes: 0.05 10*3/uL (ref 0.00–0.07)
Basophils Absolute: 0 10*3/uL (ref 0.0–0.1)
Basophils Relative: 0 %
Eosinophils Absolute: 0.1 10*3/uL (ref 0.0–0.5)
Eosinophils Relative: 2 %
HCT: 30.3 % — ABNORMAL LOW (ref 39.0–52.0)
Hemoglobin: 10.1 g/dL — ABNORMAL LOW (ref 13.0–17.0)
Immature Granulocytes: 1 %
Lymphocytes Relative: 20 %
Lymphs Abs: 1.5 10*3/uL (ref 0.7–4.0)
MCH: 31.5 pg (ref 26.0–34.0)
MCHC: 33.3 g/dL (ref 30.0–36.0)
MCV: 94.4 fL (ref 80.0–100.0)
Monocytes Absolute: 1 10*3/uL (ref 0.1–1.0)
Monocytes Relative: 13 %
Neutro Abs: 5 10*3/uL (ref 1.7–7.7)
Neutrophils Relative %: 64 %
Platelets: 265 10*3/uL (ref 150–400)
RBC: 3.21 MIL/uL — ABNORMAL LOW (ref 4.22–5.81)
RDW: 13.2 % (ref 11.5–15.5)
WBC: 7.7 10*3/uL (ref 4.0–10.5)
nRBC: 0 % (ref 0.0–0.2)

## 2018-10-10 LAB — COMPREHENSIVE METABOLIC PANEL
ALT: 14 U/L (ref 0–44)
AST: 15 U/L (ref 15–41)
Albumin: 3.6 g/dL (ref 3.5–5.0)
Alkaline Phosphatase: 48 U/L (ref 38–126)
Anion gap: 6 (ref 5–15)
BUN: 7 mg/dL — ABNORMAL LOW (ref 8–23)
CO2: 24 mmol/L (ref 22–32)
Calcium: 8.2 mg/dL — ABNORMAL LOW (ref 8.9–10.3)
Chloride: 89 mmol/L — ABNORMAL LOW (ref 98–111)
Creatinine, Ser: 0.6 mg/dL — ABNORMAL LOW (ref 0.61–1.24)
GFR calc Af Amer: >60 mL/min (ref >60)
GFR calc non Af Amer: >60 mL/min (ref >60)
Glucose, Bld: 91 mg/dL (ref 70–99)
Potassium: 4.9 mmol/L (ref 3.5–5.1)
Sodium: 119 mmol/L — CL (ref 135–145)
Total Bilirubin: 0.4 mg/dL (ref 0.3–1.2)
Total Protein: 5.9 g/dL — ABNORMAL LOW (ref 6.5–8.1)

## 2018-10-10 LAB — BASIC METABOLIC PANEL
Anion gap: 6 (ref 5–15)
BUN: 5 mg/dL — ABNORMAL LOW (ref 8–23)
CO2: 25 mmol/L (ref 22–32)
Calcium: 8.3 mg/dL — ABNORMAL LOW (ref 8.9–10.3)
Chloride: 94 mmol/L — ABNORMAL LOW (ref 98–111)
Creatinine, Ser: 0.59 mg/dL — ABNORMAL LOW (ref 0.61–1.24)
GFR calc Af Amer: >60 mL/min (ref >60)
GFR calc non Af Amer: >60 mL/min (ref >60)
Glucose, Bld: 111 mg/dL — ABNORMAL HIGH (ref 70–99)
Potassium: 4.3 mmol/L (ref 3.5–5.1)
Sodium: 125 mmol/L — ABNORMAL LOW (ref 135–145)

## 2018-10-10 LAB — TYPE AND SCREEN
ABO/RH(D): A POS
Antibody Screen: NEGATIVE

## 2018-10-10 LAB — SARS CORONAVIRUS 2 BY RT PCR (HOSPITAL ORDER, PERFORMED IN ~~LOC~~ HOSPITAL LAB): SARS Coronavirus 2: NEGATIVE

## 2018-10-10 LAB — TROPONIN I (HIGH SENSITIVITY)
Troponin I (High Sensitivity): 4 ng/L (ref <18)
Troponin I (High Sensitivity): 7 ng/L (ref <18)

## 2018-10-10 LAB — LACTIC ACID, PLASMA: Lactic Acid, Venous: 1.4 mmol/L (ref 0.5–1.9)

## 2018-10-10 LAB — OSMOLALITY, URINE: Osmolality, Ur: 166 mOsm/kg — ABNORMAL LOW (ref 300–900)

## 2018-10-10 LAB — PROTIME-INR
INR: 1 (ref 0.8–1.2)
Prothrombin Time: 12.9 seconds (ref 11.4–15.2)

## 2018-10-10 LAB — TSH: TSH: 0.899 u[IU]/mL (ref 0.350–4.500)

## 2018-10-10 LAB — OSMOLALITY: Osmolality: 249 mOsm/kg — CL (ref 275–295)

## 2018-10-10 LAB — MAGNESIUM: Magnesium: 2.1 mg/dL (ref 1.7–2.4)

## 2018-10-10 LAB — POC OCCULT BLOOD, ED: Fecal Occult Bld: NEGATIVE

## 2018-10-10 LAB — SODIUM, URINE, RANDOM: Sodium, Ur: 37 mmol/L

## 2018-10-10 LAB — LIPASE, BLOOD: Lipase: 28 U/L (ref 11–51)

## 2018-10-10 MED ORDER — PANTOPRAZOLE SODIUM 40 MG IV SOLR
40.0000 mg | INTRAVENOUS | Status: DC
  Administered 2018-10-10 – 2018-10-11 (×2): 40 mg via INTRAVENOUS
  Filled 2018-10-10 (×2): qty 40

## 2018-10-10 MED ORDER — ALBUTEROL SULFATE (2.5 MG/3ML) 0.083% IN NEBU: 2.5000 mg | INHALATION_SOLUTION | RESPIRATORY_TRACT | Status: DC | PRN

## 2018-10-10 MED ORDER — MORPHINE SULFATE (PF) 4 MG/ML IV SOLN
4.0000 mg | Freq: Once | INTRAVENOUS | Status: AC
  Administered 2018-10-10: 4 mg via INTRAVENOUS
  Filled 2018-10-10: qty 1

## 2018-10-10 MED ORDER — NITROGLYCERIN 0.4 MG SL SUBL
SUBLINGUAL_TABLET | SUBLINGUAL | Status: AC
  Filled 2018-10-10: qty 1

## 2018-10-10 MED ORDER — FINASTERIDE 5 MG PO TABS
5.0000 mg | ORAL_TABLET | Freq: Every day | ORAL | Status: DC
  Administered 2018-10-11 – 2018-10-12 (×2): 5 mg via ORAL
  Filled 2018-10-10 (×2): qty 1

## 2018-10-10 MED ORDER — ONDANSETRON HCL 4 MG PO TABS: 4.0000 mg | ORAL_TABLET | Freq: Four times a day (QID) | ORAL | Status: DC | PRN

## 2018-10-10 MED ORDER — TRAZODONE HCL 50 MG PO TABS: 25.0000 mg | ORAL_TABLET | Freq: Every evening | ORAL | Status: DC | PRN

## 2018-10-10 MED ORDER — ACETAMINOPHEN 325 MG PO TABS
650.0000 mg | ORAL_TABLET | Freq: Four times a day (QID) | ORAL | Status: DC | PRN
  Administered 2018-10-11 – 2018-10-12 (×4): 650 mg via ORAL
  Filled 2018-10-10 (×4): qty 2

## 2018-10-10 MED ORDER — TAMSULOSIN HCL 0.4 MG PO CAPS
0.4000 mg | ORAL_CAPSULE | Freq: Every day | ORAL | Status: DC
  Administered 2018-10-11 – 2018-10-12 (×2): 0.4 mg via ORAL
  Filled 2018-10-10 (×2): qty 1

## 2018-10-10 MED ORDER — SACUBITRIL-VALSARTAN 24-26 MG PO TABS
1.0000 | ORAL_TABLET | Freq: Two times a day (BID) | ORAL | Status: DC
  Administered 2018-10-10 – 2018-10-12 (×4): 1 via ORAL
  Filled 2018-10-10 (×4): qty 1

## 2018-10-10 MED ORDER — CARVEDILOL 3.125 MG PO TABS
3.1250 mg | ORAL_TABLET | Freq: Two times a day (BID) | ORAL | Status: DC
  Administered 2018-10-10 – 2018-10-12 (×4): 3.125 mg via ORAL
  Filled 2018-10-10 (×4): qty 1

## 2018-10-10 MED ORDER — BUDESONIDE 0.25 MG/2ML IN SUSP
0.2500 mg | Freq: Two times a day (BID) | RESPIRATORY_TRACT | Status: DC
  Administered 2018-10-10 – 2018-10-12 (×4): 0.25 mg via RESPIRATORY_TRACT
  Filled 2018-10-10 (×4): qty 2

## 2018-10-10 MED ORDER — ONDANSETRON HCL 4 MG/2ML IJ SOLN: 4.0000 mg | Freq: Four times a day (QID) | INTRAMUSCULAR | Status: DC | PRN

## 2018-10-10 MED ORDER — IOHEXOL 300 MG/ML  SOLN
75.0000 mL | Freq: Once | INTRAMUSCULAR | Status: AC | PRN
  Administered 2018-10-10: 75 mL via INTRAVENOUS

## 2018-10-10 MED ORDER — ATORVASTATIN CALCIUM 40 MG PO TABS
40.0000 mg | ORAL_TABLET | Freq: Every day | ORAL | Status: DC
  Administered 2018-10-11: 40 mg via ORAL
  Filled 2018-10-10: qty 1

## 2018-10-10 MED ORDER — ACETAMINOPHEN 650 MG RE SUPP: 650.0000 mg | Freq: Four times a day (QID) | RECTAL | Status: DC | PRN

## 2018-10-10 MED ORDER — IPRATROPIUM BROMIDE 0.02 % IN SOLN
0.5000 mg | Freq: Two times a day (BID) | RESPIRATORY_TRACT | Status: DC
  Administered 2018-10-10 – 2018-10-12 (×4): 0.5 mg via RESPIRATORY_TRACT
  Filled 2018-10-10 (×5): qty 2.5

## 2018-10-10 MED ORDER — NITROGLYCERIN 0.4 MG SL SUBL
0.4000 mg | SUBLINGUAL_TABLET | SUBLINGUAL | Status: DC | PRN
  Administered 2018-10-10: 0.4 mg via SUBLINGUAL

## 2018-10-10 MED ORDER — SODIUM CHLORIDE 0.9 % IV SOLN
INTRAVENOUS | Status: DC
  Administered 2018-10-10 – 2018-10-11 (×3): via INTRAVENOUS

## 2018-10-10 MED ORDER — MORPHINE SULFATE (PF) 2 MG/ML IV SOLN: 2.0000 mg | INTRAVENOUS | Status: DC | PRN

## 2018-10-10 NOTE — ED Notes (Addendum)
CRITICAL VALUE ALERT  Critical Value:  Sodium 119  Date & Time Notied:  10/10/18 & 1442 hrs  Provider Notified: Dr. Sabra Heck   Orders Received/Actions taken: N/A

## 2018-10-10 NOTE — ED Provider Notes (Signed)
Pt received at sign out with CT A/P pending as well as T/C to [facility] for admission for hyponatremia. See previous EDP note for full HPI/H&P/MDM. Pt is 73yo M, c/o abd pain, "black stools" for the past 2 days. New hyponatremia on labs. H/H lower than previous, stool heme negative. CT A/P without acute surgical process. VS remain stable. 1650:  T/C returned from [facility] Dr. Mariea ClontsEmokpae, case discussed, including:  HPI, pertinent PM/SHx, VS/PE, dx testing, ED course and treatment:  Agreeable to admit.  Patient Vitals for the past 24 hrs:  BP Temp Temp src Pulse Resp SpO2 Height Weight  10/10/18 1530 (!) 142/79 - - 73 18 100 % - -  10/10/18 1500 (!) 144/90 - - 76 18 100 % - -  10/10/18 1430 130/73 - - 65 14 100 % - -  10/10/18 1413 - - - - - 100 % - -  10/10/18 1404 - - - 72 17 - - -  10/10/18 1305 - - - - - - 5\' 8"  (1.727 m) 55.8 kg  10/10/18 1304 138/68 97.6 F (36.4 C) Oral 78 16 100 % - -    MDM Reviewed: previous chart, nursing note and vitals Reviewed previous: labs Interpretation: labs, x-ray and CT scan Total time providing critical care: 30-74 minutes. This excludes time spent performing separately reportable procedures and services. Consults: admitting MD   CRITICAL CARE Performed by: Samuel JesterKathleen Nikolas Casher Total critical care time: 35 minutes Critical care time was exclusive of separately billable procedures and treating other patients. Critical care was necessary to treat or prevent imminent or life-threatening deterioration. Critical care was time spent personally by me on the following activities: development of treatment plan with patient and/or surrogate as well as nursing, discussions with consultants, evaluation of patient's response to treatment, examination of patient, obtaining history from patient or surrogate, ordering and performing treatments and interventions, ordering and review of laboratory studies, ordering and review of radiographic studies, pulse oximetry and  re-evaluation of patient's condition.   Results for orders placed or performed during the hospital encounter of 10/10/18  CBC with Differential/Platelet  Result Value Ref Range   WBC 7.7 4.0 - 10.5 K/uL   RBC 3.21 (L) 4.22 - 5.81 MIL/uL   Hemoglobin 10.1 (L) 13.0 - 17.0 g/dL   HCT 16.130.3 (L) 09.639.0 - 04.552.0 %   MCV 94.4 80.0 - 100.0 fL   MCH 31.5 26.0 - 34.0 pg   MCHC 33.3 30.0 - 36.0 g/dL   RDW 40.913.2 81.111.5 - 91.415.5 %   Platelets 265 150 - 400 K/uL   nRBC 0.0 0.0 - 0.2 %   Neutrophils Relative % 64 %   Neutro Abs 5.0 1.7 - 7.7 K/uL   Lymphocytes Relative 20 %   Lymphs Abs 1.5 0.7 - 4.0 K/uL   Monocytes Relative 13 %   Monocytes Absolute 1.0 0.1 - 1.0 K/uL   Eosinophils Relative 2 %   Eosinophils Absolute 0.1 0.0 - 0.5 K/uL   Basophils Relative 0 %   Basophils Absolute 0.0 0.0 - 0.1 K/uL   Immature Granulocytes 1 %   Abs Immature Granulocytes 0.05 0.00 - 0.07 K/uL  Comprehensive metabolic panel  Result Value Ref Range   Sodium 119 (LL) 135 - 145 mmol/L   Potassium 4.9 3.5 - 5.1 mmol/L   Chloride 89 (L) 98 - 111 mmol/L   CO2 24 22 - 32 mmol/L   Glucose, Bld 91 70 - 99 mg/dL   BUN 7 (L) 8 -  23 mg/dL   Creatinine, Ser 0.60 (L) 0.61 - 1.24 mg/dL   Calcium 8.2 (L) 8.9 - 10.3 mg/dL   Total Protein 5.9 (L) 6.5 - 8.1 g/dL   Albumin 3.6 3.5 - 5.0 g/dL   AST 15 15 - 41 U/L   ALT 14 0 - 44 U/L   Alkaline Phosphatase 48 38 - 126 U/L   Total Bilirubin 0.4 0.3 - 1.2 mg/dL   GFR calc non Af Amer >60 >60 mL/min   GFR calc Af Amer >60 >60 mL/min   Anion gap 6 5 - 15  Lipase, blood  Result Value Ref Range   Lipase 28 11 - 51 U/L  Lactic acid, plasma  Result Value Ref Range   Lactic Acid, Venous 1.4 0.5 - 1.9 mmol/L  Protime-INR  Result Value Ref Range   Prothrombin Time 12.9 11.4 - 15.2 seconds   INR 1.0 0.8 - 1.2  POC occult blood, ED  Result Value Ref Range   Fecal Occult Bld NEGATIVE NEGATIVE  Type and screen  Result Value Ref Range   ABO/RH(D) A POS    Antibody Screen NEG     Sample Expiration      10/13/2018,2359 Performed at [facility], [address], [place], [place] [address]    Ct Abdomen Pelvis W Contrast Result Date: 10/10/2018 CLINICAL DATA:  Abdominal distension and pain for 2 days EXAM: CT ABDOMEN AND PELVIS WITH CONTRAST TECHNIQUE: Multidetector CT imaging of the abdomen and pelvis was performed using the standard protocol following bolus administration of intravenous contrast. CONTRAST:  41mL OMNIPAQUE IOHEXOL 300 MG/ML  SOLN COMPARISON:  04/27/2018 FINDINGS: Lower chest: No acute abnormality. Hepatobiliary: Gallbladder is well distended within normal limits. Focal calcification is noted within the right lobe near the dome consistent with calcified granuloma. Pancreas: Unremarkable. No pancreatic ductal dilatation or surrounding inflammatory changes. Spleen: Normal in size without focal abnormality. Adrenals/Urinary Tract: Adrenal glands are within normal limits. Kidneys are well visualized bilaterally. Extrarenal pelves are noted bilaterally with relatively normal appearing distal ureters. This is accentuated by distended urinary bladder. Stomach/Bowel: Scattered diverticular change of the colon is noted. No diverticulitis is seen. Colon is predominately decompressed. The appendix is within normal limits. Stomach is distended with fluid. Fluid-filled loops of small bowel are noted although no obstructive changes are Vascular/Lymphatic: Atherosclerotic calcifications of the abdominal aorta are noted. Mild ectasia is seen in the infrarenal aorta. No lymphatic abnormality is noted. Reproductive: Prostate is unremarkable. Other: No abdominal wall hernia or abnormality. No abdominopelvic ascites. Musculoskeletal: Degenerative changes of the lumbar spine are seen. No acute bony abnormality is noted. IMPRESSION: Diverticular change without diverticulitis. Fluid-filled stomach and loops of small bowel without definitive obstructive change. No acute abnormality is noted.  Electronically Signed   By: Inez Catalina M.D.   On: 10/10/2018 16:11   Dg Chest Port 1 View Result Date: 10/10/2018 CLINICAL DATA:  Central abdominal pain and chest pain 2 days. Diarrhea. EXAM: PORTABLE CHEST 1 VIEW COMPARISON:  04/27/2018 FINDINGS: Sternotomy wires unchanged. Lungs are adequately inflated without consolidation or effusion. Cardiomediastinal silhouette and remainder of the exam is unchanged. IMPRESSION: No acute cardiopulmonary disease. Electronically Signed   By: Marin Olp M.D.   On: 10/10/2018 14:14   Results for HOBART, MARTE (MRN [id number]) as of 10/10/2018 16:33  Results for MICHIAL, DISNEY (MRN [id number]) as of 10/10/2018 16:33  Ref. Range 11/22/2017 09:17 02/28/2018 09:01 04/27/2018 20:55 10/10/2018 14:03  Sodium Latest Ref Range: 135 - 145 mmol/L 136 138  133 (L) 119 (LL)  Chloride Latest Ref Range: 98 - 111 mmol/L 99 101 101 89 (L)    Results for Davene CostainRUITT, Keiton L (MRN [id number]) as of 10/10/2018 16:33  Ref. Range 05/06/2017 10:53 11/22/2017 09:17 04/27/2018 20:55 10/10/2018 14:03  Hemoglobin Latest Ref Range: 13.0 - 17.0 g/dL 69.612.0 (L) 29.511.8 (L) 28.412.0 (L) 10.1 (L)  HCT Latest Ref Range: 39.0 - 52.0 % 38.2 (L) 36.3 (L) 37.0 (L) 30.3 Elbert Ewings(L)     Samuel JesterMcManus, Kallan Merrick, DO 10/10/18 1649

## 2018-10-10 NOTE — ED Notes (Signed)
Wife: [phone]

## 2018-10-10 NOTE — ED Notes (Signed)
Patient transported to CT 
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
## 2018-10-10 NOTE — ED Triage Notes (Signed)
Patient presents to the ED with increased central abdominal pain for 2 days, states been "going on for one year".  Patient has had 3 episodes of diarrhea in the last 2 days.  Patient also took Miralax today stating he "could not go today".  Patient has had diarrhea 3 times today.

## 2018-10-10 NOTE — Progress Notes (Signed)
CRITICAL VALUE ALERT  Critical Value: Troponin 7.00 on High Sensitivity  And Osmolality 249  Date & Time Notied:  10/10/2018 @ 2110  Provider Notified: Arlyce Dice  Orders Received/Actions taken: awaiting
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
## 2018-10-11 ENCOUNTER — Observation Stay (HOSPITAL_BASED_OUTPATIENT_CLINIC_OR_DEPARTMENT_OTHER): Payer: Medicare HMO

## 2018-10-11 DIAGNOSIS — R55 Syncope and collapse: Secondary | ICD-10-CM | POA: Diagnosis not present

## 2018-10-11 DIAGNOSIS — I480 Paroxysmal atrial fibrillation: Secondary | ICD-10-CM | POA: Diagnosis not present

## 2018-10-11 DIAGNOSIS — R072 Precordial pain: Secondary | ICD-10-CM

## 2018-10-11 DIAGNOSIS — E871 Hypo-osmolality and hyponatremia: Secondary | ICD-10-CM | POA: Diagnosis not present

## 2018-10-11 DIAGNOSIS — J9611 Chronic respiratory failure with hypoxia: Secondary | ICD-10-CM

## 2018-10-11 DIAGNOSIS — R1084 Generalized abdominal pain: Secondary | ICD-10-CM

## 2018-10-11 LAB — BASIC METABOLIC PANEL
Anion gap: 5 (ref 5–15)
Anion gap: 7 (ref 5–15)
Anion gap: 7 (ref 5–15)
BUN: 5 mg/dL — ABNORMAL LOW (ref 8–23)
BUN: 5 mg/dL — ABNORMAL LOW (ref 8–23)
BUN: 6 mg/dL — ABNORMAL LOW (ref 8–23)
CO2: 24 mmol/L (ref 22–32)
CO2: 25 mmol/L (ref 22–32)
CO2: 27 mmol/L (ref 22–32)
Calcium: 8.3 mg/dL — ABNORMAL LOW (ref 8.9–10.3)
Calcium: 8.5 mg/dL — ABNORMAL LOW (ref 8.9–10.3)
Calcium: 8.8 mg/dL — ABNORMAL LOW (ref 8.9–10.3)
Chloride: 96 mmol/L — ABNORMAL LOW (ref 98–111)
Chloride: 97 mmol/L — ABNORMAL LOW (ref 98–111)
Chloride: 97 mmol/L — ABNORMAL LOW (ref 98–111)
Creatinine, Ser: 0.53 mg/dL — ABNORMAL LOW (ref 0.61–1.24)
Creatinine, Ser: 0.55 mg/dL — ABNORMAL LOW (ref 0.61–1.24)
Creatinine, Ser: 0.6 mg/dL — ABNORMAL LOW (ref 0.61–1.24)
GFR calc Af Amer: >60 mL/min (ref >60)
GFR calc Af Amer: >60 mL/min (ref >60)
GFR calc Af Amer: >60 mL/min (ref >60)
GFR calc non Af Amer: >60 mL/min (ref >60)
GFR calc non Af Amer: >60 mL/min (ref >60)
GFR calc non Af Amer: >60 mL/min (ref >60)
Glucose, Bld: 117 mg/dL — ABNORMAL HIGH (ref 70–99)
Glucose, Bld: 119 mg/dL — ABNORMAL HIGH (ref 70–99)
Glucose, Bld: 96 mg/dL (ref 70–99)
Potassium: 4.1 mmol/L (ref 3.5–5.1)
Potassium: 4.1 mmol/L (ref 3.5–5.1)
Potassium: 4.4 mmol/L (ref 3.5–5.1)
Sodium: 126 mmol/L — ABNORMAL LOW (ref 135–145)
Sodium: 128 mmol/L — ABNORMAL LOW (ref 135–145)
Sodium: 131 mmol/L — ABNORMAL LOW (ref 135–145)

## 2018-10-11 LAB — URINALYSIS, COMPLETE (UACMP) WITH MICROSCOPIC
Bilirubin Urine: NEGATIVE
Glucose, UA: NEGATIVE mg/dL
Ketones, ur: NEGATIVE mg/dL
Nitrite: NEGATIVE
Protein, ur: NEGATIVE mg/dL
Specific Gravity, Urine: 1.01 (ref 1.005–1.030)
pH: 7 (ref 5.0–8.0)

## 2018-10-11 LAB — CBC
HCT: 29.9 % — ABNORMAL LOW (ref 39.0–52.0)
Hemoglobin: 10.1 g/dL — ABNORMAL LOW (ref 13.0–17.0)
MCH: 31.8 pg (ref 26.0–34.0)
MCHC: 33.8 g/dL (ref 30.0–36.0)
MCV: 94 fL (ref 80.0–100.0)
Platelets: 323 10*3/uL (ref 150–400)
RBC: 3.18 MIL/uL — ABNORMAL LOW (ref 4.22–5.81)
RDW: 13.2 % (ref 11.5–15.5)
WBC: 6.6 10*3/uL (ref 4.0–10.5)
nRBC: 0 % (ref 0.0–0.2)

## 2018-10-11 LAB — FOLATE: Folate: 6 ng/mL (ref >5.9)

## 2018-10-11 LAB — VITAMIN B12: Vitamin B-12: 875 pg/mL (ref 180–914)

## 2018-10-11 LAB — IRON AND TIBC
Iron: 50 ug/dL (ref 45–182)
Saturation Ratios: 19 % (ref 17.9–39.5)
TIBC: 268 ug/dL (ref 250–450)
UIBC: 218 ug/dL

## 2018-10-11 LAB — ECHOCARDIOGRAM COMPLETE
Height: 66 in
Weight: 1922.41 oz

## 2018-10-11 LAB — FERRITIN: Ferritin: 110 ng/mL (ref 24–336)

## 2018-10-11 NOTE — Progress Notes (Signed)
*  PRELIMINARY RESULTS* Echocardiogram 2D Echocardiogram has been performed.  Leavy Cella 10/11/2018, 10:34 AM

## 2018-10-11 NOTE — Progress Notes (Signed)
PROGRESS NOTE  Devin CostainFrank L Barton WUJ:[id number] DOB: [birth year] DOA: 10/10/2018 PCP: [name], John, MD  Brief History:  73 year old male with a history of systolic and diastolic CHF, COPD, chronic respiratory failure on 1.5 L, hypertension, coronary artery disease, postoperative atrial fibrillation presenting with abdominal pain and syncopal episodes.  Regarding his abdominal pain, the patient states that he has had abdominal pain for the better part of 2 years.  He states that his episodes are waxing and waning occasionally worsening with laying down and certain movements.  He states that his abdominal pain had worsened over the past 2 to 3 days.  He states that when it happens he uses MiraLAX and drinks prune juice.  After he has a number of bowel movements he states that his abdominal pain is somewhat improved.  He denies any diarrhea, hematochezia, melena, dysuria, hematuria.  He has not had any fevers, chills, hematemesis.  He had one episode of nausea and vomiting on 10/09/2018, but has eaten without any further vomiting since then.  12/08/2017 colonoscopy showed diverticulosis.  04/29/2017 EGD showed normal esophagus and normal stomach. Regarding his syncope, the patient states that he had a syncopal episode on 10/02/2018 and again on 10/03/2018.  He went to see his primary care provider on 10/03/2018 secondary to a syncopal episode and falling.  The patient was prescribed tramadol.  He states that he is only taking 4 to 5 pills because it makes him feel funny.  As he further describes a syncopal episodes, they are generally related to the patient going to the bathroom.  He states that he has been having dizziness when he first gets up.  In addition, he states that he has been feeling "offkilter" for the past week, and he feels that that may be contributing to his passing out spells and falling.  He denies any new medications.  He has not had any diarrhea.  He has not had any fevers, chills.  He states  that he has been eating relatively well.  Because of his abdominal pain and syncope, the patient presented for further evaluation. In the emergency department, the patient was afebrile hemodynamically stable saturating 100% on 2 L.  BMP showed a sodium 119 with serum creatinine 0.30.  CBC was essentially unremarkable with WBC 7.7, hemoglobin 10.1.  CT of the brain was negative.  FOBT was negative.  Chest x-ray was negative.  Assessment/Plan: Hyponatremia -Patient presented with sodium 119 -Secondary to volume depletion in the setting of diuresis with furosemide -Gradually improving with gentle hydration -A.m. BMP  Syncope -Due to volume depletion and orthostasis -Check orthostatics--positive on pulse -Echocardiogram -Remain on telemetry  Chronic abdominal pain -10/10/2018 CT abdomen and pelvis--no acute findings -In part related to the patient's constipation -Overall improved after multiple bowel movements -obtain UA and urine culture  Chronic systolic and diastolic CHF -02/11/2017 echo EF 30-35%, grade 1 DD -Daily weights -Holding furosemide today in the setting of volume depletion -Restart carvedilol -Continue Entresto  Chronic respiratory failure with hypoxia/COPD -Stable 1.5 L -Continue Atrovent and Pulmicort  Atypical Chest Pain -no chest pain presenty -personally reviewed EKG--sinus, LBBB -personally reviewed CXR--no infiltrates or edema  Essential hypertension -Continue carvedilol  Hyperlipidemia -Continue statin  Anemia of chronic disease -check anemia panel     Disposition Plan:   Home 7/19 if stable Family Communication:   Spouse updated on phone 7/18  Consultants:  none  Code Status:  FULL  DVT Prophylaxis:  Elim  Lovenox   Procedures: As Listed in Progress Note Above  Antibiotics: None       Subjective: Patient complains of some mild abdominal pain but states that it is about 90% better than yesterday.  He denies any fevers, chills,  headache, neck pain, shortness breath, nausea, vomiting, diarrhea, dysuria, hematuria.  There is no hematochezia or melena.  Objective: Vitals:   10/10/18 2000 10/10/18 2104 10/10/18 2153 10/11/18 0641  BP: (!) 144/77  129/81 123/69  Pulse: 90  78 66  Resp: 19  19 19   Temp: 98.7 F (37.1 C)  98.4 F (36.9 C) 98.5 F (36.9 C)  TempSrc: Oral  Oral Oral  SpO2: 100% 94% 100% 99%  Weight: 54.5 kg     Height: 5\' 6"  (1.676 m)       Intake/Output Summary (Last 24 hours) at 10/11/2018 0725 Last data filed at 10/11/2018 0306 Gross per 24 hour  Intake 997.35 ml  Output 1100 ml  Net -102.65 ml   Weight change:  Exam:   General:  Pt is alert, follows commands appropriately, not in acute distress  HEENT: No icterus, No thrush, No neck mass, [place]/AT  Cardiovascular: RRR, S1/S2, no rubs, no gallops  Respiratory: CTA bilaterally, no wheezing, no crackles, no rhonchi  Abdomen: Soft/+BS, non tender, non distended, no guarding  Extremities: No edema, No lymphangitis, No petechiae, No rashes, no synovitis   Data Reviewed: I have personally reviewed following labs and imaging studies Basic Metabolic Panel: Recent Labs  Lab 10/10/18 1403 10/10/18 2004 10/10/18 2352 10/11/18 0401  NA 119* 125* 126* 128*  K 4.9 4.3 4.1 4.1  CL 89* 94* 96* 97*  CO2 24 25 25 24   GLUCOSE 91 111* 117* 96  BUN 7* 5* 5* 5*  CREATININE 0.60* 0.59* 0.53* 0.55*  CALCIUM 8.2* 8.3* 8.3* 8.5*  MG 2.1  --   --   --    Liver Function Tests: Recent Labs  Lab 10/10/18 1403  AST 15  ALT 14  ALKPHOS 48  BILITOT 0.4  PROT 5.9*  ALBUMIN 3.6   Recent Labs  Lab 10/10/18 1403  LIPASE 28   No results for input(s): AMMONIA in the last 168 hours. Coagulation Profile: Recent Labs  Lab 10/10/18 1403  INR 1.0   CBC: Recent Labs  Lab 10/10/18 1403 10/11/18 0401  WBC 7.7 6.6  NEUTROABS 5.0  --   HGB 10.1* 10.1*  HCT 30.3* 29.9*  MCV 94.4 94.0  PLT 265 323   Cardiac Enzymes: No results for  input(s): CKTOTAL, CKMB, CKMBINDEX, TROPONINI in the last 168 hours. BNP: Invalid input(s): POCBNP CBG: No results for input(s): GLUCAP in the last 168 hours. HbA1C: No results for input(s): HGBA1C in the last 72 hours. Urine analysis:    Component Value Date/Time   COLORURINE YELLOW 04/27/2018 2340   APPEARANCEUR CLEAR 04/27/2018 2340   LABSPEC 1.012 04/27/2018 2340   PHURINE 6.0 04/27/2018 2340   GLUCOSEU NEGATIVE 04/27/2018 2340   HGBUR SMALL (A) 04/27/2018 2340   BILIRUBINUR NEGATIVE 04/27/2018 2340   KETONESUR 20 (A) 04/27/2018 2340   PROTEINUR NEGATIVE 04/27/2018 2340   UROBILINOGEN 0.2 09/29/2007 1000   NITRITE NEGATIVE 04/27/2018 2340   LEUKOCYTESUR NEGATIVE 04/27/2018 2340   Sepsis Labs: @LABRCNTIP (procalcitonin:4,lacticidven:4) ) Recent Results (from the past 240 hour(s))  SARS Coronavirus 2 (CEPHEID - Performed in [facility]), Hosp Order     Status: None   Collection Time: 10/10/18  5:15 PM   Specimen: Nasopharyngeal Swab  Result  Value Ref Range Status   SARS Coronavirus 2 NEGATIVE NEGATIVE Final    Comment: (NOTE) If result is NEGATIVE SARS-CoV-2 target nucleic acids are NOT DETECTED. The SARS-CoV-2 RNA is generally detectable in upper and lower  respiratory specimens during the acute phase of infection. The lowest  concentration of SARS-CoV-2 viral copies this assay can detect is 250  copies / mL. A negative result does not preclude SARS-CoV-2 infection  and should not be used as the sole basis for treatment or other  patient management decisions.  A negative result may occur with  improper specimen collection / handling, submission of specimen other  than nasopharyngeal swab, presence of viral mutation(s) within the  areas targeted by this assay, and inadequate number of viral copies  (<250 copies / mL). A negative result must be combined with clinical  observations, patient history, and epidemiological information. If result is  POSITIVE SARS-CoV-2 target nucleic acids are DETECTED. The SARS-CoV-2 RNA is generally detectable in upper and lower  respiratory specimens dur ing the acute phase of infection.  Positive  results are indicative of active infection with SARS-CoV-2.  Clinical  correlation with patient history and other diagnostic information is  necessary to determine patient infection status.  Positive results do  not rule out bacterial infection or co-infection with other viruses. If result is PRESUMPTIVE POSTIVE SARS-CoV-2 nucleic acids MAY BE PRESENT.   A presumptive positive result was obtained on the submitted specimen  and confirmed on repeat testing.  While 2019 novel coronavirus  (SARS-CoV-2) nucleic acids may be present in the submitted sample  additional confirmatory testing may be necessary for epidemiological  and / or clinical management purposes  to differentiate between  SARS-CoV-2 and other Sarbecovirus currently known to infect humans.  If clinically indicated additional testing with an alternate test  methodology [phone]([id number]) is advised. The SARS-CoV-2 RNA is generally  detectable in upper and lower respiratory sp ecimens during the acute  phase of infection. The expected result is Negative. Fact Sheet for Patients:  BoilerBrush.com.cyhttps://www.fda.gov/media/136312/download Fact Sheet for Healthcare Providers: https://pope.com/https://www.fda.gov/media/136313/download This test is not yet approved or cleared by the [place] FDA and has been authorized for detection and/or diagnosis of SARS-CoV-2 by FDA under an Emergency Use Authorization (EUA).  This EUA will remain in effect (meaning this test can be used) for the duration of the COVID-19 declaration under Section 564(b)(1) of the Act, 21 U.S.C. section 360bbb-3(b)(1), unless the authorization is terminated or revoked sooner. Performed at [facility], [address] [place], [place] [address]      Scheduled Meds:  atorvastatin  40 mg Oral q1800    budesonide  0.25 mg Nebulization BID   carvedilol  3.125 mg Oral BID WC   finasteride  5 mg Oral Daily   ipratropium  0.5 mg Nebulization BID   pantoprazole (PROTONIX) IV  40 mg Intravenous Q24H   sacubitril-valsartan  1 tablet Oral BID   tamsulosin  0.4 mg Oral Daily   Continuous Infusions:  sodium chloride 50 mL/hr at 10/11/18 0001    Procedures/Studies: Ct Head Wo Contrast  Result Date: 10/10/2018 CLINICAL DATA:  Multiple recent falls. Possible syncope. EXAM: CT HEAD WITHOUT CONTRAST TECHNIQUE: Contiguous axial images were obtained from the base of the skull through the vertex without intravenous contrast. COMPARISON:  03/07/2017, 09/29/2007. FINDINGS: Brain: Minimal to mild age-appropriate cortical atrophy. Minimal changes of small vessel disease of the white matter. Ventricular system normal in size and appearance for age. No mass lesion. No midline shift.  No acute hemorrhage or hematoma. No extra-axial fluid collections. No evidence of acute infarction. Note is made of a partial empty sella. Vascular: Moderate BILATERAL carotid siphon and severe LEFT vertebral artery atherosclerosis. No hyperdense vessel. Skull: No skull fracture or other focal osseous abnormality involving the skull. Sinuses/Orbits: Visualized paranasal sinuses, bilateral mastoid air cells and bilateral middle ear cavities well-aerated. Visualized orbits and globes normal in appearance. Other: None. IMPRESSION: 1. No acute intracranial abnormality. 2. Minimal to mild age-appropriate cortical atrophy and minimal chronic microvascular ischemic changes of the white matter. Electronically Signed   By: Evangeline Dakin M.D.   On: 10/10/2018 19:26   Ct Abdomen Pelvis W Contrast  Result Date: 10/10/2018 CLINICAL DATA:  Abdominal distension and pain for 2 days EXAM: CT ABDOMEN AND PELVIS WITH CONTRAST TECHNIQUE: Multidetector CT imaging of the abdomen and pelvis was performed using the standard protocol following bolus  administration of intravenous contrast. CONTRAST:  87mL OMNIPAQUE IOHEXOL 300 MG/ML  SOLN COMPARISON:  04/27/2018 FINDINGS: Lower chest: No acute abnormality. Hepatobiliary: Gallbladder is well distended within normal limits. Focal calcification is noted within the right lobe near the dome consistent with calcified granuloma. Pancreas: Unremarkable. No pancreatic ductal dilatation or surrounding inflammatory changes. Spleen: Normal in size without focal abnormality. Adrenals/Urinary Tract: Adrenal glands are within normal limits. Kidneys are well visualized bilaterally. Extrarenal pelves are noted bilaterally with relatively normal appearing distal ureters. This is accentuated by distended urinary bladder. Stomach/Bowel: Scattered diverticular change of the colon is noted. No diverticulitis is seen. Colon is predominately decompressed. The appendix is within normal limits. Stomach is distended with fluid. Fluid-filled loops of small bowel are noted although no obstructive changes are Vascular/Lymphatic: Atherosclerotic calcifications of the abdominal aorta are noted. Mild ectasia is seen in the infrarenal aorta. No lymphatic abnormality is noted. Reproductive: Prostate is unremarkable. Other: No abdominal wall hernia or abnormality. No abdominopelvic ascites. Musculoskeletal: Degenerative changes of the lumbar spine are seen. No acute bony abnormality is noted. IMPRESSION: Diverticular change without diverticulitis. Fluid-filled stomach and loops of small bowel without definitive obstructive change. No acute abnormality is noted. Electronically Signed   By: Inez Catalina M.D.   On: 10/10/2018 16:11   Dg Chest Port 1 View  Result Date: 10/10/2018 CLINICAL DATA:  Central abdominal pain and chest pain 2 days. Diarrhea. EXAM: PORTABLE CHEST 1 VIEW COMPARISON:  04/27/2018 FINDINGS: Sternotomy wires unchanged. Lungs are adequately inflated without consolidation or effusion. Cardiomediastinal silhouette and remainder of  the exam is unchanged. IMPRESSION: No acute cardiopulmonary disease. Electronically Signed   By: Marin Olp M.D.   On: 10/10/2018 14:14    Orson Eva, DO  [facility] Pager [phone]  If 7PM-7AM, please contact night-coverage [email or web address] Password [email or web address] 10/11/2018, 7:25 AM   LOS: 0 days

## 2018-10-11 NOTE — Evaluation (Addendum)
Physical Therapy Evaluation Patient Details Name: Devin Barton MRN: [id number] DOB: 1945/05/05 Today's Date: 10/11/2018   History of Present Illness  Devin Barton is a 73 y.o. male with medical history significant for systolic heart failure, COPD 3 and asthma, hypertension, coronary artery disease, atrial fibrillation, presented to the ED with complaints of generalized abdominal pain.  Abdominal pain is chronic and generalized and is been ongoing for at least a year.  Patient came to the ED because his abdominal pain has been worse over the past 2 days and reports about 2-3 intermittent black stools over the past 2 days also.    Clinical Impression  Patient found awake and alert in bed upon entering. Patient's SpO2 was 97% with 2 L O2 via nasal cannula while sitting EOB before ambulation. Patient performed sit to stand with supervision patient ambulated 150 feet intermittently assisting to push O2 tank and without with supervision assistance. Mid walk patient's SpO2 reading 100%. Patient demonstrating good balance while ambulating and with turns. Patient requested return to bed following therapy. Patient's Spo2 reading 96% with patient on 2 L O2 via nasal cannula throughout. Patient would benefit from continued skilled physical therapy in order to improve patient's strength, endurance, and overall functional mobility while in hospital and at recommended venue listed below to improve patient's independence.     Follow Up Recommendations Supervision for mobility/OOB;Home health PT    Equipment Recommendations       Recommendations for Other Services       Precautions / Restrictions Precautions Precautions: Fall Restrictions Weight Bearing Restrictions: No      Mobility  Bed Mobility Overal bed mobility: Modified Independent             General bed mobility comments: Increased time with bed mobility  Transfers Overall transfer level: Needs assistance Equipment used:  None Transfers: Sit to/from Stand Sit to Stand: Supervision         General transfer comment: Patient performed sit to stand with supervision and no use of AD steady while standing  Ambulation/Gait Ambulation/Gait assistance: Supervision Gait Distance (Feet): 150 Feet Assistive device: Rolling walker (2 wheeled) Gait Pattern/deviations: Step-to pattern;Decreased stride length Gait velocity: Decreased      Stairs            Wheelchair Mobility    Modified Rankin (Stroke Patients Only)       Balance Overall balance assessment: Independent                                           Pertinent Vitals/Pain Pain Assessment: No/denies pain    Home Living Family/patient expects to be discharged to:: Private residence Living Arrangements: Spouse/significant other Available Help at Discharge: Family;Available 24 hours/day Type of Home: House Home Access: Level entry     Home Layout: One level Home Equipment: Cane - single point;Walker - 2 wheels      Prior Function Level of Independence: Independent;Independent with assistive device(s)         Comments: Patient reported intermittently using RW for ambulation, but primarily ambulating without assistive device.     Hand Dominance        Extremity/Trunk Assessment   Upper Extremity Assessment Upper Extremity Assessment: Generalized weakness    Lower Extremity Assessment Lower Extremity Assessment: Generalized weakness    Cervical / Trunk Assessment Cervical / Trunk Assessment: Normal  Communication   Communication:  No difficulties  Cognition Arousal/Alertness: Awake/alert Behavior During Therapy: WFL for tasks assessed/performed Overall Cognitive Status: Within Functional Limits for tasks assessed                                        General Comments      Exercises     Assessment/Plan    PT Assessment Patient needs continued PT services  PT Problem List  Decreased strength;Decreased mobility;Decreased activity tolerance;Decreased balance       PT Treatment Interventions DME instruction;Gait training;Stair training;Functional mobility training;Therapeutic activities;Therapeutic exercise;Balance training;Neuromuscular re-education;Patient/family education    PT Goals (Current goals can be found in the Care Plan section)  Acute Rehab PT Goals Patient Stated Goal: To return home PT Goal Formulation: With patient Time For Goal Achievement: 10/18/18 Potential to Achieve Goals: Good    Frequency Min 3X/week   Barriers to discharge        Co-evaluation               AM-PAC PT "6 Clicks" Mobility  Outcome Measure Help needed turning from your back to your side while in a flat bed without using bedrails?: None Help needed moving from lying on your back to sitting on the side of a flat bed without using bedrails?: None Help needed moving to and from a bed to a chair (including a wheelchair)?: A Little Help needed standing up from a chair using your arms (e.g., wheelchair or bedside chair)?: None Help needed to walk in hospital room?: None Help needed climbing 3-5 steps with a railing? : A Little 6 Click Score: 22    End of Session Equipment Utilized During Treatment: Gait belt Activity Tolerance: Patient tolerated treatment well;No increased pain Patient left: in bed(Per patient request) Nurse Communication: Mobility status PT Visit Diagnosis: Unsteadiness on feet (R26.81);Other abnormalities of gait and mobility (R26.89);Muscle weakness (generalized) (M62.81)    Time: [id number] PT Time Calculation (min) (ACUTE ONLY): 22 min   Charges:   PT Evaluation $PT Eval Low Complexity: 1 Low         Verne CarrowMacy Keiva Dina PT, DPT 8:59 AM, 10/11/18 [phone]

## 2018-10-11 NOTE — Plan of Care (Signed)
  Problem: Acute Rehab PT Goals(only PT should resolve) Goal: Pt Will Go Supine/Side To Sit Outcome: Progressing Flowsheets (Taken 10/11/2018 0900) Pt will go Supine/Side to Sit: Independently Goal: Patient Will Transfer Sit To/From Stand Outcome: Progressing Flowsheets (Taken 10/11/2018 0900) Patient will transfer sit to/from stand: Independently Goal: Pt Will Transfer Bed To Chair/Chair To Bed Outcome: Progressing Flowsheets (Taken 10/11/2018 0900) Pt will Transfer Bed to Chair/Chair to Bed: Independently Goal: Pt Will Ambulate Outcome: Progressing Flowsheets (Taken 10/11/2018 0900) Pt will Ambulate:  > 125 feet  Independently    Clarene Critchley PT, DPT 9:00 AM, 10/11/18 [phone]

## 2018-10-12 DIAGNOSIS — R072 Precordial pain: Secondary | ICD-10-CM | POA: Diagnosis not present

## 2018-10-12 DIAGNOSIS — R1084 Generalized abdominal pain: Secondary | ICD-10-CM | POA: Diagnosis not present

## 2018-10-12 DIAGNOSIS — E871 Hypo-osmolality and hyponatremia: Secondary | ICD-10-CM | POA: Diagnosis not present

## 2018-10-12 DIAGNOSIS — J9611 Chronic respiratory failure with hypoxia: Secondary | ICD-10-CM | POA: Diagnosis not present

## 2018-10-12 DIAGNOSIS — R55 Syncope and collapse: Secondary | ICD-10-CM | POA: Diagnosis not present

## 2018-10-12 DIAGNOSIS — I480 Paroxysmal atrial fibrillation: Secondary | ICD-10-CM | POA: Diagnosis not present

## 2018-10-12 LAB — BASIC METABOLIC PANEL
Anion gap: 8 (ref 5–15)
BUN: 8 mg/dL (ref 8–23)
CO2: 23 mmol/L (ref 22–32)
Calcium: 8.3 mg/dL — ABNORMAL LOW (ref 8.9–10.3)
Chloride: 100 mmol/L (ref 98–111)
Creatinine, Ser: 0.58 mg/dL — ABNORMAL LOW (ref 0.61–1.24)
GFR calc Af Amer: >60 mL/min (ref >60)
GFR calc non Af Amer: >60 mL/min (ref >60)
Glucose, Bld: 96 mg/dL (ref 70–99)
Potassium: 3.6 mmol/L (ref 3.5–5.1)
Sodium: 131 mmol/L — ABNORMAL LOW (ref 135–145)

## 2018-10-12 MED ORDER — CEFDINIR 300 MG PO CAPS: 300.0000 mg | ORAL_CAPSULE | Freq: Two times a day (BID) | ORAL | 0 refills | Status: DC

## 2018-10-12 NOTE — Plan of Care (Signed)

## 2018-10-12 NOTE — Plan of Care (Signed)
  Problem: Education: Goal: Knowledge of General Education information will improve Description: Including pain rating scale, medication(s)/side effects and non-pharmacologic comfort measures 10/12/2018 1053 by Marcello Moores, RN Outcome: Adequate for Discharge 10/12/2018 1044 by Marcello Moores, RN Outcome: Adequate for Discharge   Problem: Health Behavior/Discharge Planning: Goal: Ability to manage health-related needs will improve 10/12/2018 1053 by Marcello Moores, RN Outcome: Adequate for Discharge 10/12/2018 1044 by Marcello Moores, RN Outcome: Adequate for Discharge   Problem: Clinical Measurements: Goal: Ability to maintain clinical measurements within normal limits will improve 10/12/2018 1053 by Marcello Moores, RN Outcome: Adequate for Discharge 10/12/2018 1044 by Marcello Moores, RN Outcome: Adequate for Discharge Goal: Will remain free from infection 10/12/2018 1053 by Marcello Moores, RN Outcome: Adequate for Discharge 10/12/2018 1044 by Marcello Moores, RN Outcome: Adequate for Discharge Goal: Diagnostic test results will improve 10/12/2018 1053 by Marcello Moores, RN Outcome: Adequate for Discharge 10/12/2018 1044 by Marcello Moores, RN Outcome: Adequate for Discharge Goal: Respiratory complications will improve 10/12/2018 1053 by Marcello Moores, RN Outcome: Adequate for Discharge 10/12/2018 1044 by Marcello Moores, RN Outcome: Adequate for Discharge Goal: Cardiovascular complication will be avoided 10/12/2018 1053 by Marcello Moores, RN Outcome: Adequate for Discharge 10/12/2018 1044 by Marcello Moores, RN Outcome: Adequate for Discharge   Problem: Activity: Goal: Risk for activity intolerance will decrease 10/12/2018 1053 by Marcello Moores, RN Outcome: Adequate for Discharge 10/12/2018 1044 by Marcello Moores, RN Outcome: Adequate for Discharge   Problem: Nutrition: Goal: Adequate nutrition will be  maintained 10/12/2018 1053 by Marcello Moores, RN Outcome: Adequate for Discharge 10/12/2018 1044 by Marcello Moores, RN Outcome: Adequate for Discharge   Problem: Coping: Goal: Level of anxiety will decrease 10/12/2018 1053 by Marcello Moores, RN Outcome: Adequate for Discharge 10/12/2018 1044 by Marcello Moores, RN Outcome: Adequate for Discharge   Problem: Elimination: Goal: Will not experience complications related to bowel motility 10/12/2018 1053 by Marcello Moores, RN Outcome: Adequate for Discharge 10/12/2018 1044 by Marcello Moores, RN Outcome: Adequate for Discharge Goal: Will not experience complications related to urinary retention 10/12/2018 1053 by Marcello Moores, RN Outcome: Adequate for Discharge 10/12/2018 1044 by Marcello Moores, RN Outcome: Adequate for Discharge   Problem: Pain Managment: Goal: General experience of comfort will improve 10/12/2018 1053 by Marcello Moores, RN Outcome: Adequate for Discharge 10/12/2018 1044 by Marcello Moores, RN Outcome: Adequate for Discharge   Problem: Safety: Goal: Ability to remain free from injury will improve 10/12/2018 1053 by Marcello Moores, RN Outcome: Adequate for Discharge 10/12/2018 1044 by Marcello Moores, RN Outcome: Adequate for Discharge   Problem: Skin Integrity: Goal: Risk for impaired skin integrity will decrease 10/12/2018 1053 by Marcello Moores, RN Outcome: Adequate for Discharge 10/12/2018 1044 by Marcello Moores, RN Outcome: Adequate for Discharge

## 2018-10-12 NOTE — Discharge Summary (Addendum)
Physician Discharge Summary  Devin Barton JQB:[id number] DOB: January 01, 1946 DOA: 10/10/2018  PCP: Sharilyn Sites, MD  Admit date: 10/10/2018 Discharge date: 10/12/2018  Admitted From: Home Disposition:  Home   Recommendations for Outpatient Follow-up:  1. Follow up with PCP in 1-2 weeks 2. Please obtain BMP/CBC in one week   Home Health: 1.5L and HHPT   Discharge Condition: Stable CODE STATUS: FULL Diet recommendation: Heart Healthy    Brief/Interim Summary: 73 year old male with a history of systolic and diastolic CHF, COPD, chronic respiratory failure on 1.5 L, hypertension, coronary artery disease, postoperative atrial fibrillation presenting with abdominal pain and syncopal episodes.  Regarding his abdominal pain, the patient states that he has had abdominal pain for the better part of 2 years.  He states that his episodes are waxing and waning occasionally worsening with laying down and certain movements.  He states that his abdominal pain had worsened over the past 2 to 3 days.  He states that when it happens he uses MiraLAX and drinks prune juice.  After he has a number of bowel movements he states that his abdominal pain is somewhat improved.  He denies any diarrhea, hematochezia, melena, dysuria, hematuria.  He has not had any fevers, chills, hematemesis.  He had one episode of nausea and vomiting on 10/09/2018, but has eaten without any further vomiting since then.  12/08/2017 colonoscopy showed diverticulosis.  04/29/2017 EGD showed normal esophagus and normal stomach. Regarding his syncope, the patient states that he had a syncopal episode on 10/02/2018 and again on 10/03/2018.  He went to see his primary care provider on 10/03/2018 secondary to a syncopal episode and falling.  The patient was prescribed tramadol.  He states that he is only taking 4 to 5 pills because it makes him feel funny.  As he further describes a syncopal episodes, they are generally related to the patient going to  the bathroom.  He states that he has been having dizziness when he first gets up.  In addition, he states that he has been feeling "offkilter" for the past week, and he feels that that may be contributing to his passing out spells and falling.  He denies any new medications.  He has not had any diarrhea.  He has not had any fevers, chills.  He states that he has been eating relatively well.  Because of his abdominal pain and syncope, the patient presented for further evaluation. In the emergency department, the patient was afebrile hemodynamically stable saturating 100% on 2 L.  BMP showed a sodium 119 with serum creatinine 0.30.  CBC was essentially unremarkable with WBC 7.7, hemoglobin 10.1.  CT of the brain was negative.  FOBT was negative.  Chest x-ray was negative.  Discharge Diagnoses:  Hyponatremia -Patient presented with sodium 119 -Secondary to volume depletion in the setting of diuresis with furosemide -Gradually improving with gentle hydration -Na = 131 on day of d/c  Syncope -Due to volume depletion and orthostasis -Check orthostatics--positive on pulse -repeat orthostatics improved -Echocardiogram--EF 50-55%, LBBB, normal RV, +impaired relaxation -Remain on telemetry--no concerning dysrhythmias  Chronic abdominal pain -10/10/2018 CT abdomen and pelvis--no acute findings -In part related to the patient's constipation -Overall improved after multiple bowel movements -UA--21-50WBC -urine culture pending -d/c home with 3 days empiric cefdinir  Chronic systolic and diastolic CHF -40/97/3532 echo EF 30-35%, grade 1 DD -Daily weights -Holding furosemide in the setting of volume depletion--restart after d/c -Restart carvedilol -Continue Entresto  Chronic respiratory failure with hypoxia/COPD -Stable 1.5 L -  Continue Atrovent and Pulmicort  Atypical Chest Pain -no chest pain presenty -personally reviewed EKG--sinus, LBBB -personally reviewed CXR--no infiltrates or  edema  Essential hypertension -Continue carvedilol  Hyperlipidemia -Continue statin  Anemia of chronic disease -check anemia panel   Discharge Instructions   Allergies as of 10/12/2018   No Known Allergies     Medication List    STOP taking these medications   amoxicillin 500 MG capsule Commonly known as: AMOXIL     TAKE these medications   albuterol 108 (90 Base) MCG/ACT inhaler Commonly known as: VENTOLIN HFA Inhale 2 puffs into the lungs every 6 (six) hours as needed.   aspirin EC 81 MG tablet Take 81 mg by mouth every morning.   atorvastatin 40 MG tablet Commonly known as: LIPITOR TAKE 1 TABLET BY MOUTH ONCE DAILY AT 6PM   bismuth subsalicylate 262 MG/15ML suspension Commonly known as: PEPTO BISMOL Take 30 mLs by mouth every 6 (six) hours as needed (upset stomach).   budesonide 0.25 MG/2ML nebulizer solution Commonly known as: PULMICORT Take 2 mLs (0.25 mg total) by nebulization 2 (two) times daily.   carvedilol 3.125 MG tablet Commonly known as: COREG Take 1 tablet by mouth twice daily   cefdinir 300 MG capsule Commonly known as: OMNICEF Take 1 capsule (300 mg total) by mouth 2 (two) times daily.   Entresto 24-26 MG Generic drug: sacubitril-valsartan Take 1 tablet by mouth twice daily   feeding supplement (ENSURE ENLIVE) Liqd Take 237 mLs by mouth 2 (two) times daily between meals.   finasteride 5 MG tablet Commonly known as: PROSCAR Take 5 mg by mouth daily.   furosemide 40 MG tablet Commonly known as: LASIX TAKE 1 TABLET BY MOUTH ONCE DAILY MAY TAKE AN ADDITIONAL 40 MG FOR WEIGHT GAIN OR SWELLING What changed:   how much to take  how to take this  when to take this   ipratropium 0.02 % nebulizer solution Commonly known as: ATROVENT Take 0.5 mg by nebulization 2 (two) times daily.   linaclotide 290 MCG Caps capsule Commonly known as: LINZESS Take 1 capsule (290 mcg total) by mouth daily before breakfast.   polyethylene glycol  17 g packet Commonly known as: MIRALAX / GLYCOLAX Take 17 g by mouth daily as needed for mild constipation or moderate constipation.   potassium chloride SA 20 MEQ tablet Commonly known as: K-DUR Take 1 tablet by mouth once daily   sucralfate 1 GM/10ML suspension Commonly known as: CARAFATE Take 1 g by mouth 4 (four) times daily -  with meals and at bedtime.   tamsulosin 0.4 MG Caps capsule Commonly known as: FLOMAX Take 0.4 mg by mouth daily.       No Known Allergies  Consultations:  none   Procedures/Studies: Ct Head Wo Contrast  Result Date: 10/10/2018 CLINICAL DATA:  Multiple recent falls. Possible syncope. EXAM: CT HEAD WITHOUT CONTRAST TECHNIQUE: Contiguous axial images were obtained from the base of the skull through the vertex without intravenous contrast. COMPARISON:  03/07/2017, 09/29/2007. FINDINGS: Brain: Minimal to mild age-appropriate cortical atrophy. Minimal changes of small vessel disease of the white matter. Ventricular system normal in size and appearance for age. No mass lesion. No midline shift. No acute hemorrhage or hematoma. No extra-axial fluid collections. No evidence of acute infarction. Note is made of a partial empty sella. Vascular: Moderate BILATERAL carotid siphon and severe LEFT vertebral artery atherosclerosis. No hyperdense vessel. Skull: No skull fracture or other focal osseous abnormality involving the skull. Sinuses/Orbits: Visualized paranasal  sinuses, bilateral mastoid air cells and bilateral middle ear cavities well-aerated. Visualized orbits and globes normal in appearance. Other: None. IMPRESSION: 1. No acute intracranial abnormality. 2. Minimal to mild age-appropriate cortical atrophy and minimal chronic microvascular ischemic changes of the white matter. Electronically Signed   By: Hulan Saas M.D.   On: 10/10/2018 19:26   Ct Abdomen Pelvis W Contrast  Result Date: 10/10/2018 CLINICAL DATA:  Abdominal distension and pain for 2 days  EXAM: CT ABDOMEN AND PELVIS WITH CONTRAST TECHNIQUE: Multidetector CT imaging of the abdomen and pelvis was performed using the standard protocol following bolus administration of intravenous contrast. CONTRAST:  75mL OMNIPAQUE IOHEXOL 300 MG/ML  SOLN COMPARISON:  04/27/2018 FINDINGS: Lower chest: No acute abnormality. Hepatobiliary: Gallbladder is well distended within normal limits. Focal calcification is noted within the right lobe near the dome consistent with calcified granuloma. Pancreas: Unremarkable. No pancreatic ductal dilatation or surrounding inflammatory changes. Spleen: Normal in size without focal abnormality. Adrenals/Urinary Tract: Adrenal glands are within normal limits. Kidneys are well visualized bilaterally. Extrarenal pelves are noted bilaterally with relatively normal appearing distal ureters. This is accentuated by distended urinary bladder. Stomach/Bowel: Scattered diverticular change of the colon is noted. No diverticulitis is seen. Colon is predominately decompressed. The appendix is within normal limits. Stomach is distended with fluid. Fluid-filled loops of small bowel are noted although no obstructive changes are Vascular/Lymphatic: Atherosclerotic calcifications of the abdominal aorta are noted. Mild ectasia is seen in the infrarenal aorta. No lymphatic abnormality is noted. Reproductive: Prostate is unremarkable. Other: No abdominal wall hernia or abnormality. No abdominopelvic ascites. Musculoskeletal: Degenerative changes of the lumbar spine are seen. No acute bony abnormality is noted. IMPRESSION: Diverticular change without diverticulitis. Fluid-filled stomach and loops of small bowel without definitive obstructive change. No acute abnormality is noted. Electronically Signed   By: Alcide Clever M.D.   On: 10/10/2018 16:11   Dg Chest Port 1 View  Result Date: 10/10/2018 CLINICAL DATA:  Central abdominal pain and chest pain 2 days. Diarrhea. EXAM: PORTABLE CHEST 1 VIEW  COMPARISON:  04/27/2018 FINDINGS: Sternotomy wires unchanged. Lungs are adequately inflated without consolidation or effusion. Cardiomediastinal silhouette and remainder of the exam is unchanged. IMPRESSION: No acute cardiopulmonary disease. Electronically Signed   By: Elberta Fortis M.D.   On: 10/10/2018 14:14        Discharge Exam: Vitals:   10/12/18 0907 10/12/18 0913  BP:    Pulse:    Resp:    Temp:    SpO2: 100% 100%   Vitals:   10/12/18 0500 10/12/18 0541 10/12/18 0907 10/12/18 0913  BP:  123/69    Pulse:  66    Resp:  16    Temp:  98.1 F (36.7 C)    TempSrc:  Oral    SpO2:  99% 100% 100%  Weight: 54.4 kg     Height:        General: Pt is alert, awake, not in acute distress Cardiovascular: RRR, S1/S2 +, no rubs, no gallops Respiratory: bibasilar rales. No wheeze Abdominal: Soft, NT, ND, bowel sounds + Extremities: no edema, no cyanosis   The results of significant diagnostics from this hospitalization (including imaging, microbiology, ancillary and laboratory) are listed below for reference.    Significant Diagnostic Studies: Ct Head Wo Contrast  Result Date: 10/10/2018 CLINICAL DATA:  Multiple recent falls. Possible syncope. EXAM: CT HEAD WITHOUT CONTRAST TECHNIQUE: Contiguous axial images were obtained from the base of the skull through the vertex without intravenous contrast. COMPARISON:  03/07/2017, 09/29/2007. FINDINGS: Brain: Minimal to mild age-appropriate cortical atrophy. Minimal changes of small vessel disease of the white matter. Ventricular system normal in size and appearance for age. No mass lesion. No midline shift. No acute hemorrhage or hematoma. No extra-axial fluid collections. No evidence of acute infarction. Note is made of a partial empty sella. Vascular: Moderate BILATERAL carotid siphon and severe LEFT vertebral artery atherosclerosis. No hyperdense vessel. Skull: No skull fracture or other focal osseous abnormality involving the skull.  Sinuses/Orbits: Visualized paranasal sinuses, bilateral mastoid air cells and bilateral middle ear cavities well-aerated. Visualized orbits and globes normal in appearance. Other: None. IMPRESSION: 1. No acute intracranial abnormality. 2. Minimal to mild age-appropriate cortical atrophy and minimal chronic microvascular ischemic changes of the white matter. Electronically Signed   By: Hulan Saas M.D.   On: 10/10/2018 19:26   Ct Abdomen Pelvis W Contrast  Result Date: 10/10/2018 CLINICAL DATA:  Abdominal distension and pain for 2 days EXAM: CT ABDOMEN AND PELVIS WITH CONTRAST TECHNIQUE: Multidetector CT imaging of the abdomen and pelvis was performed using the standard protocol following bolus administration of intravenous contrast. CONTRAST:  75mL OMNIPAQUE IOHEXOL 300 MG/ML  SOLN COMPARISON:  04/27/2018 FINDINGS: Lower chest: No acute abnormality. Hepatobiliary: Gallbladder is well distended within normal limits. Focal calcification is noted within the right lobe near the dome consistent with calcified granuloma. Pancreas: Unremarkable. No pancreatic ductal dilatation or surrounding inflammatory changes. Spleen: Normal in size without focal abnormality. Adrenals/Urinary Tract: Adrenal glands are within normal limits. Kidneys are well visualized bilaterally. Extrarenal pelves are noted bilaterally with relatively normal appearing distal ureters. This is accentuated by distended urinary bladder. Stomach/Bowel: Scattered diverticular change of the colon is noted. No diverticulitis is seen. Colon is predominately decompressed. The appendix is within normal limits. Stomach is distended with fluid. Fluid-filled loops of small bowel are noted although no obstructive changes are Vascular/Lymphatic: Atherosclerotic calcifications of the abdominal aorta are noted. Mild ectasia is seen in the infrarenal aorta. No lymphatic abnormality is noted. Reproductive: Prostate is unremarkable. Other: No abdominal wall hernia  or abnormality. No abdominopelvic ascites. Musculoskeletal: Degenerative changes of the lumbar spine are seen. No acute bony abnormality is noted. IMPRESSION: Diverticular change without diverticulitis. Fluid-filled stomach and loops of small bowel without definitive obstructive change. No acute abnormality is noted. Electronically Signed   By: Alcide Clever M.D.   On: 10/10/2018 16:11   Dg Chest Port 1 View  Result Date: 10/10/2018 CLINICAL DATA:  Central abdominal pain and chest pain 2 days. Diarrhea. EXAM: PORTABLE CHEST 1 VIEW COMPARISON:  04/27/2018 FINDINGS: Sternotomy wires unchanged. Lungs are adequately inflated without consolidation or effusion. Cardiomediastinal silhouette and remainder of the exam is unchanged. IMPRESSION: No acute cardiopulmonary disease. Electronically Signed   By: Elberta Fortis M.D.   On: 10/10/2018 14:14     Microbiology: Recent Results (from the past 240 hour(s))  SARS Coronavirus 2 (CEPHEID - Performed in [facility] hospital lab), Hosp Order     Status: None   Collection Time: 10/10/18  5:15 PM   Specimen: Nasopharyngeal Swab  Result Value Ref Range Status   SARS Coronavirus 2 NEGATIVE NEGATIVE Final    Comment: (NOTE) If result is NEGATIVE SARS-CoV-2 target nucleic acids are NOT DETECTED. The SARS-CoV-2 RNA is generally detectable in upper and lower  respiratory specimens during the acute phase of infection. The lowest  concentration of SARS-CoV-2 viral copies this assay can detect is 250  copies / mL. A negative result does not preclude SARS-CoV-2 infection  and should not be used as the sole basis for treatment or other  patient management decisions.  A negative result may occur with  improper specimen collection / handling, submission of specimen other  than nasopharyngeal swab, presence of viral mutation(s) within the  areas targeted by this assay, and inadequate number of viral copies  (<250 copies / mL). A negative result must be combined with  clinical  observations, patient history, and epidemiological information. If result is POSITIVE SARS-CoV-2 target nucleic acids are DETECTED. The SARS-CoV-2 RNA is generally detectable in upper and lower  respiratory specimens dur ing the acute phase of infection.  Positive  results are indicative of active infection with SARS-CoV-2.  Clinical  correlation with patient history and other diagnostic information is  necessary to determine patient infection status.  Positive results do  not rule out bacterial infection or co-infection with other viruses. If result is PRESUMPTIVE POSTIVE SARS-CoV-2 nucleic acids MAY BE PRESENT.   A presumptive positive result was obtained on the submitted specimen  and confirmed on repeat testing.  While 2019 novel coronavirus  (SARS-CoV-2) nucleic acids may be present in the submitted sample  additional confirmatory testing may be necessary for epidemiological  and / or clinical management purposes  to differentiate between  SARS-CoV-2 and other Sarbecovirus currently known to infect humans.  If clinically indicated additional testing with an alternate test  methodology [phone]([id number]) is advised. The SARS-CoV-2 RNA is generally  detectable in upper and lower respiratory sp ecimens during the acute  phase of infection. The expected result is Negative. Fact Sheet for Patients:  BoilerBrush.com.cyhttps://www.fda.gov/media/136312/download Fact Sheet for Healthcare Providers: https://pope.com/https://www.fda.gov/media/136313/download This test is not yet approved or cleared by the [place] FDA and has been authorized for detection and/or diagnosis of SARS-CoV-2 by FDA under an Emergency Use Authorization (EUA).  This EUA will remain in effect (meaning this test can be used) for the duration of the COVID-19 declaration under Section 564(b)(1) of the Act, 21 U.S.C. section 360bbb-3(b)(1), unless the authorization is terminated or revoked sooner. Performed at [facility], [address]  [place], [place] [address]      Labs: Basic Metabolic Panel: Recent Labs  Lab 10/10/18 1403 10/10/18 2004 10/10/18 2352 10/11/18 0401 10/11/18 1011 10/12/18 0528  NA 119* 125* 126* 128* 131* 131*  K 4.9 4.3 4.1 4.1 4.4 3.6  CL 89* 94* 96* 97* 97* 100  CO2 24 25 25 24 27 23   GLUCOSE 91 111* 117* 96 119* 96  BUN 7* 5* 5* 5* 6* 8  CREATININE 0.60* 0.59* 0.53* 0.55* 0.60* 0.58*  CALCIUM 8.2* 8.3* 8.3* 8.5* 8.8* 8.3*  MG 2.1  --   --   --   --   --    Liver Function Tests: Recent Labs  Lab 10/10/18 1403  AST 15  ALT 14  ALKPHOS 48  BILITOT 0.4  PROT 5.9*  ALBUMIN 3.6   Recent Labs  Lab 10/10/18 1403  LIPASE 28   No results for input(s): AMMONIA in the last 168 hours. CBC: Recent Labs  Lab 10/10/18 1403 10/11/18 0401  WBC 7.7 6.6  NEUTROABS 5.0  --   HGB 10.1* 10.1*  HCT 30.3* 29.9*  MCV 94.4 94.0  PLT 265 323   Cardiac Enzymes: No results for input(s): CKTOTAL, CKMB, CKMBINDEX, TROPONINI in the last 168 hours. BNP: Invalid input(s): POCBNP CBG: No results for input(s): GLUCAP in the last 168 hours.  Time coordinating discharge:  36 minutes  Signed:  Catarina Hartshornavid Reilly Molchan, DO  [facility] Pager: [phone] 10/12/2018, 9:18 AM

## 2018-10-13 DIAGNOSIS — I5021 Acute systolic (congestive) heart failure: Secondary | ICD-10-CM | POA: Diagnosis not present

## 2018-10-13 DIAGNOSIS — J449 Chronic obstructive pulmonary disease, unspecified: Secondary | ICD-10-CM | POA: Diagnosis not present

## 2018-10-14 DIAGNOSIS — J441 Chronic obstructive pulmonary disease with (acute) exacerbation: Secondary | ICD-10-CM | POA: Diagnosis not present

## 2018-10-14 DIAGNOSIS — I5021 Acute systolic (congestive) heart failure: Secondary | ICD-10-CM | POA: Diagnosis not present

## 2018-10-14 DIAGNOSIS — T79A11A Traumatic compartment syndrome of right upper extremity, initial encounter: Secondary | ICD-10-CM | POA: Diagnosis not present

## 2018-10-14 DIAGNOSIS — E871 Hypo-osmolality and hyponatremia: Secondary | ICD-10-CM | POA: Diagnosis not present

## 2018-10-14 DIAGNOSIS — Z0000 Encounter for general adult medical examination without abnormal findings: Secondary | ICD-10-CM | POA: Diagnosis not present

## 2018-10-14 DIAGNOSIS — Z1389 Encounter for screening for other disorder: Secondary | ICD-10-CM | POA: Diagnosis not present

## 2018-10-14 DIAGNOSIS — R269 Unspecified abnormalities of gait and mobility: Secondary | ICD-10-CM | POA: Diagnosis not present

## 2018-10-14 DIAGNOSIS — D649 Anemia, unspecified: Secondary | ICD-10-CM | POA: Diagnosis not present

## 2018-10-14 DIAGNOSIS — Z681 Body mass index (BMI) 19 or less, adult: Secondary | ICD-10-CM | POA: Diagnosis not present

## 2018-10-14 DIAGNOSIS — J449 Chronic obstructive pulmonary disease, unspecified: Secondary | ICD-10-CM | POA: Diagnosis not present

## 2018-10-14 DIAGNOSIS — E860 Dehydration: Secondary | ICD-10-CM | POA: Diagnosis not present

## 2018-10-14 LAB — URINE CULTURE: Culture: 30000 — AB

## 2018-10-22 IMAGING — DX DG CHEST 2V
2 series · 2 of 2 positions shown · non-contrast
Comparison: Chest x-ray of October 15, 2016

CLINICAL DATA: Status post CABG on October 05, 2016. History of asthma
-COPD. The patient reports some shortness of breath. Former smoker.

EXAM:
CHEST  2 VIEW

[dg chest 2 view (1 of 2)]
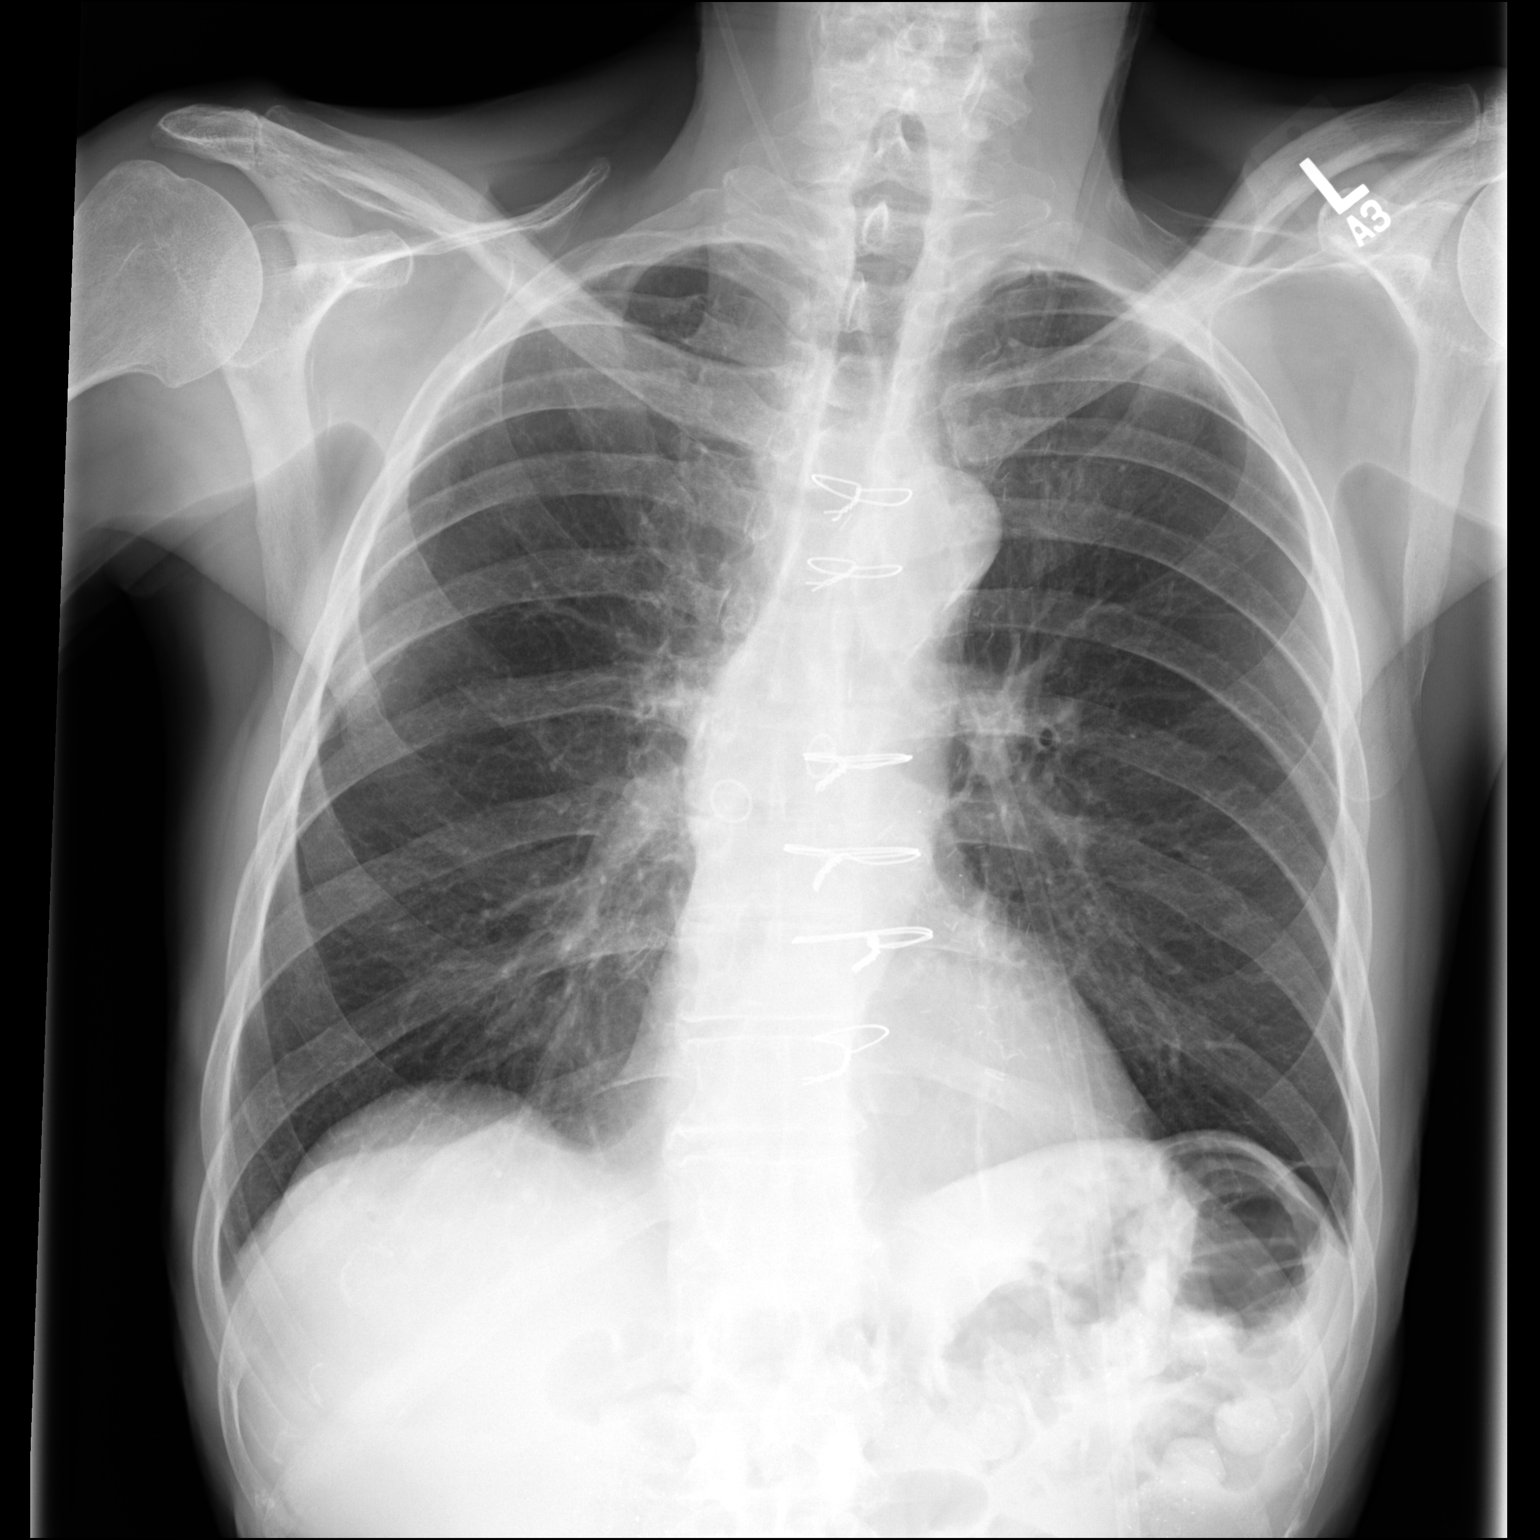

[dg chest 2 view (2 of 2)]
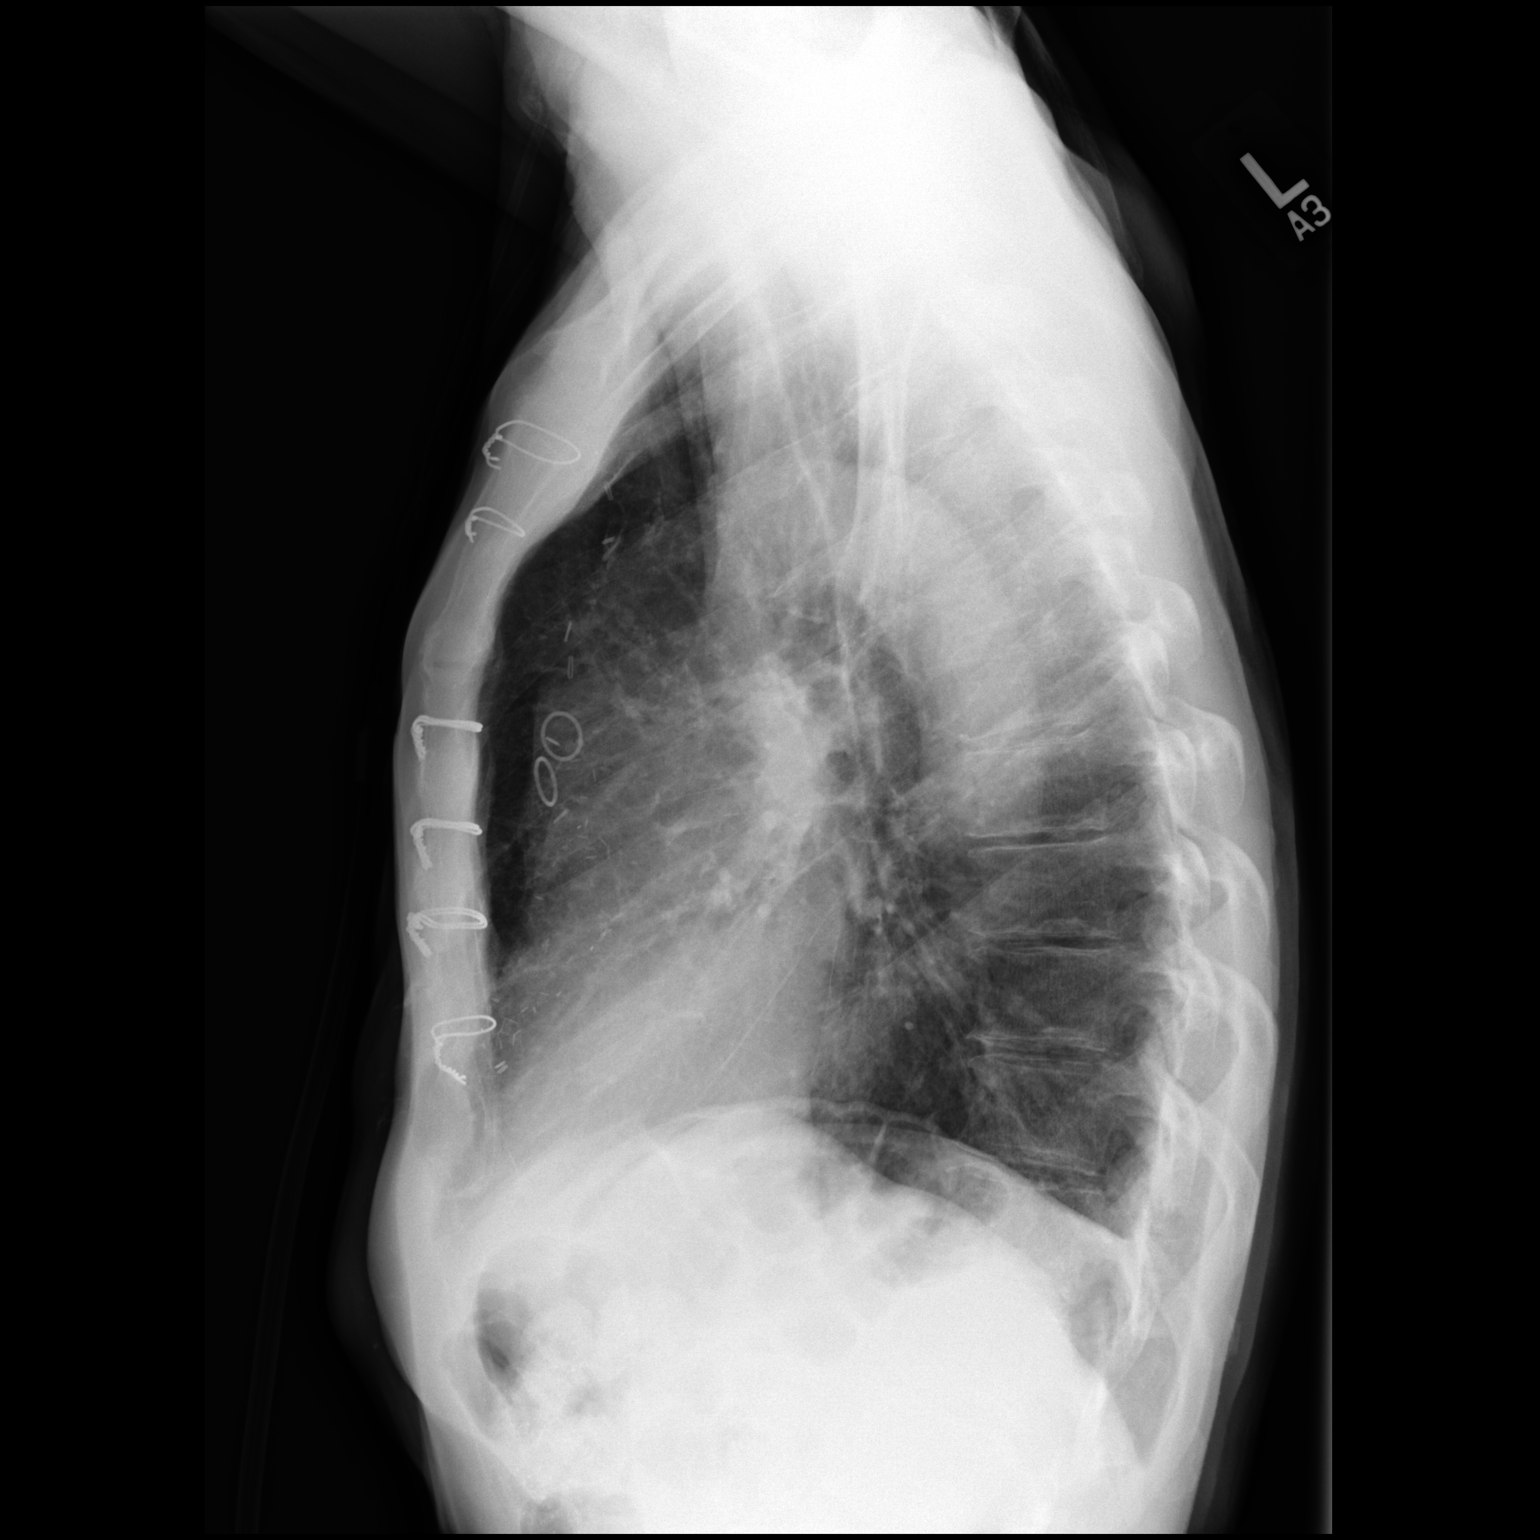

[2 of 2 positions shown; findings below may reference images not displayed]

FINDINGS: The lungs are well-expanded and clear. The small bilateral pleural
effusions have cleared. The heart and pulmonary vascularity are
normal. The sternal wires are intact. There is calcification in the
wall of the thoracic aorta. The mediastinum is normal in width. The
retrosternal soft tissues are normal. There is mild multilevel
degenerative disc disease of the thoracic spine.
IMPRESSION: There is no acute cardiopulmonary abnormality. Interval clearing of
bilateral pleural effusions.

Thoracic aortic atherosclerosis.

## 2018-10-30 DIAGNOSIS — R69 Illness, unspecified: Secondary | ICD-10-CM | POA: Diagnosis not present

## 2018-11-02 DIAGNOSIS — R69 Illness, unspecified: Secondary | ICD-10-CM | POA: Diagnosis not present

## 2018-11-07 DIAGNOSIS — R42 Dizziness and giddiness: Secondary | ICD-10-CM | POA: Diagnosis not present

## 2018-11-07 DIAGNOSIS — Z681 Body mass index (BMI) 19 or less, adult: Secondary | ICD-10-CM | POA: Diagnosis not present

## 2018-11-07 DIAGNOSIS — J0110 Acute frontal sinusitis, unspecified: Secondary | ICD-10-CM | POA: Diagnosis not present

## 2018-11-13 DIAGNOSIS — I5021 Acute systolic (congestive) heart failure: Secondary | ICD-10-CM | POA: Diagnosis not present

## 2018-11-13 DIAGNOSIS — J449 Chronic obstructive pulmonary disease, unspecified: Secondary | ICD-10-CM | POA: Diagnosis not present

## 2018-11-17 DIAGNOSIS — R69 Illness, unspecified: Secondary | ICD-10-CM | POA: Diagnosis not present

## 2018-12-12 DIAGNOSIS — R69 Illness, unspecified: Secondary | ICD-10-CM | POA: Diagnosis not present

## 2018-12-13 ENCOUNTER — Other Ambulatory Visit: Payer: Self-pay | Admitting: Cardiology

## 2018-12-14 DIAGNOSIS — J449 Chronic obstructive pulmonary disease, unspecified: Secondary | ICD-10-CM | POA: Diagnosis not present

## 2018-12-14 DIAGNOSIS — I5021 Acute systolic (congestive) heart failure: Secondary | ICD-10-CM | POA: Diagnosis not present

## 2018-12-24 DIAGNOSIS — E878 Other disorders of electrolyte and fluid balance, not elsewhere classified: Secondary | ICD-10-CM | POA: Diagnosis not present

## 2018-12-25 DIAGNOSIS — R69 Illness, unspecified: Secondary | ICD-10-CM | POA: Diagnosis not present

## 2019-01-11 ENCOUNTER — Other Ambulatory Visit: Payer: Self-pay | Admitting: Cardiology

## 2019-01-11 DIAGNOSIS — R69 Illness, unspecified: Secondary | ICD-10-CM | POA: Diagnosis not present

## 2019-01-13 DIAGNOSIS — J449 Chronic obstructive pulmonary disease, unspecified: Secondary | ICD-10-CM | POA: Diagnosis not present

## 2019-01-13 DIAGNOSIS — I5021 Acute systolic (congestive) heart failure: Secondary | ICD-10-CM | POA: Diagnosis not present

## 2019-01-14 ENCOUNTER — Ambulatory Visit (INDEPENDENT_AMBULATORY_CARE_PROVIDER_SITE_OTHER): Payer: Medicare HMO | Admitting: Urology

## 2019-01-14 DIAGNOSIS — R3912 Poor urinary stream: Secondary | ICD-10-CM

## 2019-01-14 DIAGNOSIS — R338 Other retention of urine: Secondary | ICD-10-CM | POA: Diagnosis not present

## 2019-02-01 ENCOUNTER — Other Ambulatory Visit: Payer: Self-pay | Admitting: Cardiology

## 2019-02-02 DIAGNOSIS — R69 Illness, unspecified: Secondary | ICD-10-CM | POA: Diagnosis not present

## 2019-02-03 DIAGNOSIS — R69 Illness, unspecified: Secondary | ICD-10-CM | POA: Diagnosis not present

## 2019-02-06 DIAGNOSIS — R3912 Poor urinary stream: Secondary | ICD-10-CM | POA: Diagnosis not present

## 2019-02-06 DIAGNOSIS — R338 Other retention of urine: Secondary | ICD-10-CM | POA: Diagnosis not present

## 2019-02-13 DIAGNOSIS — I5021 Acute systolic (congestive) heart failure: Secondary | ICD-10-CM | POA: Diagnosis not present

## 2019-02-13 DIAGNOSIS — J449 Chronic obstructive pulmonary disease, unspecified: Secondary | ICD-10-CM | POA: Diagnosis not present

## 2019-02-14 ENCOUNTER — Other Ambulatory Visit: Payer: Self-pay | Admitting: Cardiology

## 2019-02-24 ENCOUNTER — Telehealth: Payer: Self-pay | Admitting: Cardiology

## 2019-02-24 NOTE — Telephone Encounter (Signed)
Opened in error

## 2019-03-02 DIAGNOSIS — R69 Illness, unspecified: Secondary | ICD-10-CM | POA: Diagnosis not present

## 2019-03-07 DIAGNOSIS — R69 Illness, unspecified: Secondary | ICD-10-CM | POA: Diagnosis not present

## 2019-03-11 ENCOUNTER — Telehealth (INDEPENDENT_AMBULATORY_CARE_PROVIDER_SITE_OTHER): Payer: Medicare HMO | Admitting: Cardiology

## 2019-03-11 ENCOUNTER — Encounter: Payer: Self-pay | Admitting: Cardiology

## 2019-03-11 VITALS — BP 106/62 | HR 62 | Ht 66.0 in | Wt 120.0 lb

## 2019-03-11 DIAGNOSIS — I2510 Atherosclerotic heart disease of native coronary artery without angina pectoris: Secondary | ICD-10-CM

## 2019-03-11 DIAGNOSIS — I5022 Chronic systolic (congestive) heart failure: Secondary | ICD-10-CM

## 2019-03-11 DIAGNOSIS — I6529 Occlusion and stenosis of unspecified carotid artery: Secondary | ICD-10-CM

## 2019-03-11 DIAGNOSIS — I255 Ischemic cardiomyopathy: Secondary | ICD-10-CM | POA: Diagnosis not present

## 2019-03-11 NOTE — Addendum Note (Signed)
Addended by: Julian Hy T on: 03/11/2019 02:29 PM   Modules accepted: Orders
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
## 2019-03-11 NOTE — Patient Instructions (Signed)
Your physician recommends that you schedule a follow-up appointment in: [place]  Your physician recommends that you continue on your current medications as directed. Please refer to the Current Medication list given to you today.  Your physician recommends that you return for lab work BMP/MG - [place]  Thank you for choosing [facility]!!

## 2019-03-15 DIAGNOSIS — J449 Chronic obstructive pulmonary disease, unspecified: Secondary | ICD-10-CM | POA: Diagnosis not present

## 2019-03-15 DIAGNOSIS — I5021 Acute systolic (congestive) heart failure: Secondary | ICD-10-CM | POA: Diagnosis not present

## 2019-03-16 ENCOUNTER — Other Ambulatory Visit: Payer: Self-pay | Admitting: Cardiology

## 2019-03-16 ENCOUNTER — Other Ambulatory Visit: Payer: Self-pay | Admitting: Urology

## 2019-03-25 ENCOUNTER — Ambulatory Visit (INDEPENDENT_AMBULATORY_CARE_PROVIDER_SITE_OTHER): Payer: Medicare HMO | Admitting: Urology

## 2019-03-25 ENCOUNTER — Other Ambulatory Visit: Payer: Self-pay

## 2019-03-25 ENCOUNTER — Encounter: Payer: Self-pay | Admitting: Urology

## 2019-03-25 VITALS — BP 106/65 | HR 62 | Temp 98.1°F | Ht 65.0 in | Wt 123.0 lb

## 2019-03-25 DIAGNOSIS — R339 Retention of urine, unspecified: Secondary | ICD-10-CM

## 2019-03-25 DIAGNOSIS — N401 Enlarged prostate with lower urinary tract symptoms: Secondary | ICD-10-CM | POA: Diagnosis not present

## 2019-03-25 DIAGNOSIS — N138 Other obstructive and reflux uropathy: Secondary | ICD-10-CM

## 2019-03-25 DIAGNOSIS — I5022 Chronic systolic (congestive) heart failure: Secondary | ICD-10-CM | POA: Diagnosis not present
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
## 2019-03-25 NOTE — Progress Notes (Signed)
Order submitted to [facility] for monthly catheter orders per Dr. Alyson Ingles.

## 2019-03-25 NOTE — Patient Instructions (Signed)
Acute Urinary Retention, Male  Acute urinary retention means that you cannot pee (urinate) at all, or that you pee too little and your bladder is not emptied completely. If it is not treated, it can lead to kidney damage or other serious problems. Follow these instructions at home:  Take over-the-counter and prescription medicines only as told by your doctor. Ask your doctor what medicines you should stay away from. Do not take any medicine unless your doctor says it is okay to do so.  If you were sent home with a tube that drains the bladder (catheter), take care of it as told by your doctor.  Drink enough fluid to keep your pee clear or pale yellow.  If you were given an antibiotic, take it as told by your doctor. Do not stop taking the antibiotic even if you start to feel better.  Do not use any products that contain nicotine or tobacco, such as cigarettes and e-cigarettes. If you need help quitting, ask your doctor.  Watch for changes in your symptoms. Tell your doctor about them.  If told, track changes in your blood pressure at home. Tell your doctor about them.  Keep all follow-up visits as told by your doctor. This is important. Contact a doctor if:  You have spasms or you leak pee when you have spasms. Get help right away if:  You have chills or a fever.  You have a tube that drains the bladder and: ? The tube stops draining pee. ? The tube falls out.  You have blood in your pee. Summary  Acute urinary retention means that you have problems peeing. It may mean that you cannot pee at all, or that you pee too little.  If this condition is not treated, it can lead to kidney damage or other serious problems.  If you were sent home with a tube that drains the bladder, take care of it as told by your doctor.  Monitor any changes in your symptoms. Tell your doctor about any changes. This information is not intended to replace advice given to you by your health care  provider. Make sure you discuss any questions you have with your health care provider. Document Released: 08/29/2007 Document Revised: 05/29/2018 Document Reviewed: 04/13/2016 Elsevier Patient Education  2020 [employer]  

## 2019-03-26 DIAGNOSIS — R339 Retention of urine, unspecified: Secondary | ICD-10-CM | POA: Diagnosis not present

## 2019-03-26 DIAGNOSIS — R3912 Poor urinary stream: Secondary | ICD-10-CM | POA: Diagnosis not present

## 2019-03-30 ENCOUNTER — Telehealth: Payer: Self-pay | Admitting: *Deleted

## 2019-03-30 DIAGNOSIS — R69 Illness, unspecified: Secondary | ICD-10-CM | POA: Diagnosis not present

## 2019-03-30 NOTE — Telephone Encounter (Signed)
-----   Message from Antoine Poche, MD sent at 03/26/2019  9:14 AM EST ----- Labs look good  Dominga Ferry MD

## 2019-03-30 NOTE — Telephone Encounter (Signed)
Pt aware - routed to pcp  

## 2019-04-13 DIAGNOSIS — R69 Illness, unspecified: Secondary | ICD-10-CM | POA: Diagnosis not present

## 2019-04-14 DIAGNOSIS — R69 Illness, unspecified: Secondary | ICD-10-CM | POA: Diagnosis not present

## 2019-04-15 DIAGNOSIS — I5021 Acute systolic (congestive) heart failure: Secondary | ICD-10-CM | POA: Diagnosis not present

## 2019-04-15 DIAGNOSIS — J449 Chronic obstructive pulmonary disease, unspecified: Secondary | ICD-10-CM | POA: Diagnosis not present

## 2019-04-18 ENCOUNTER — Other Ambulatory Visit: Payer: Self-pay | Admitting: Cardiology

## 2019-05-02 ENCOUNTER — Other Ambulatory Visit: Payer: Self-pay | Admitting: Cardiology

## 2019-05-12 DIAGNOSIS — R69 Illness, unspecified: Secondary | ICD-10-CM | POA: Diagnosis not present

## 2019-05-16 DIAGNOSIS — J449 Chronic obstructive pulmonary disease, unspecified: Secondary | ICD-10-CM | POA: Diagnosis not present

## 2019-05-16 DIAGNOSIS — I5021 Acute systolic (congestive) heart failure: Secondary | ICD-10-CM | POA: Diagnosis not present

## 2019-05-18 ENCOUNTER — Other Ambulatory Visit: Payer: Self-pay | Admitting: Cardiology

## 2019-05-29 DIAGNOSIS — R69 Illness, unspecified: Secondary | ICD-10-CM | POA: Diagnosis not present

## 2019-06-13 DIAGNOSIS — I5021 Acute systolic (congestive) heart failure: Secondary | ICD-10-CM | POA: Diagnosis not present

## 2019-06-13 DIAGNOSIS — J449 Chronic obstructive pulmonary disease, unspecified: Secondary | ICD-10-CM | POA: Diagnosis not present

## 2019-06-20 DIAGNOSIS — R69 Illness, unspecified: Secondary | ICD-10-CM | POA: Diagnosis not present

## 2019-06-21 ENCOUNTER — Other Ambulatory Visit: Payer: Self-pay | Admitting: Cardiology

## 2019-06-21 ENCOUNTER — Other Ambulatory Visit: Payer: Self-pay | Admitting: Urology

## 2019-06-25 DIAGNOSIS — R3912 Poor urinary stream: Secondary | ICD-10-CM | POA: Diagnosis not present

## 2019-06-25 DIAGNOSIS — R339 Retention of urine, unspecified: Secondary | ICD-10-CM | POA: Diagnosis not present

## 2019-07-02 DIAGNOSIS — R69 Illness, unspecified: Secondary | ICD-10-CM | POA: Diagnosis not present

## 2019-07-11 DIAGNOSIS — R69 Illness, unspecified: Secondary | ICD-10-CM | POA: Diagnosis not present

## 2019-07-14 DIAGNOSIS — J449 Chronic obstructive pulmonary disease, unspecified: Secondary | ICD-10-CM | POA: Diagnosis not present

## 2019-07-14 DIAGNOSIS — I5021 Acute systolic (congestive) heart failure: Secondary | ICD-10-CM | POA: Diagnosis not present

## 2019-07-24 DIAGNOSIS — Z87891 Personal history of nicotine dependence: Secondary | ICD-10-CM | POA: Diagnosis not present

## 2019-07-24 DIAGNOSIS — I5043 Acute on chronic combined systolic (congestive) and diastolic (congestive) heart failure: Secondary | ICD-10-CM | POA: Diagnosis not present

## 2019-07-24 DIAGNOSIS — J449 Chronic obstructive pulmonary disease, unspecified: Secondary | ICD-10-CM | POA: Diagnosis not present

## 2019-08-05 DIAGNOSIS — R69 Illness, unspecified: Secondary | ICD-10-CM | POA: Diagnosis not present

## 2019-08-13 DIAGNOSIS — I5021 Acute systolic (congestive) heart failure: Secondary | ICD-10-CM | POA: Diagnosis not present

## 2019-08-13 DIAGNOSIS — J449 Chronic obstructive pulmonary disease, unspecified: Secondary | ICD-10-CM | POA: Diagnosis not present

## 2019-08-24 DIAGNOSIS — R69 Illness, unspecified: Secondary | ICD-10-CM | POA: Diagnosis not present

## 2019-08-28 DIAGNOSIS — R69 Illness, unspecified: Secondary | ICD-10-CM | POA: Diagnosis not present

## 2019-08-31 DIAGNOSIS — R3912 Poor urinary stream: Secondary | ICD-10-CM | POA: Diagnosis not present

## 2019-08-31 DIAGNOSIS — R339 Retention of urine, unspecified: Secondary | ICD-10-CM | POA: Diagnosis not present

## 2019-09-09 ENCOUNTER — Encounter: Payer: Self-pay | Admitting: *Deleted

## 2019-09-09 ENCOUNTER — Ambulatory Visit: Payer: Medicare HMO | Admitting: Cardiology

## 2019-09-09 ENCOUNTER — Encounter: Payer: Self-pay | Admitting: Cardiology

## 2019-09-09 VITALS — BP 100/58 | HR 74 | Ht 67.0 in | Wt 124.4 lb

## 2019-09-09 DIAGNOSIS — I6523 Occlusion and stenosis of bilateral carotid arteries: Secondary | ICD-10-CM | POA: Diagnosis not present

## 2019-09-09 DIAGNOSIS — I5022 Chronic systolic (congestive) heart failure: Secondary | ICD-10-CM

## 2019-09-09 DIAGNOSIS — I2510 Atherosclerotic heart disease of native coronary artery without angina pectoris: Secondary | ICD-10-CM
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
## 2019-09-09 NOTE — Patient Instructions (Signed)
Medication Instructions:  Continue all current medications.    Labwork: none  Testing/Procedures: Your physician has requested that you have a carotid duplex. This test is an ultrasound of the carotid arteries in your neck. It looks at blood flow through these arteries that supply the brain with blood. Allow one hour for this exam. There are no restrictions or special instructions. Office will contact with results via phone or letter.    Follow-Up: 6 months   Any Other Special Instructions Will Be Listed Below (If Applicable).   If you need a refill on your cardiac medications before your next appointment, please call your pharmacy.  

## 2019-09-13 DIAGNOSIS — J449 Chronic obstructive pulmonary disease, unspecified: Secondary | ICD-10-CM | POA: Diagnosis not present

## 2019-09-13 DIAGNOSIS — I5021 Acute systolic (congestive) heart failure: Secondary | ICD-10-CM | POA: Diagnosis not present

## 2019-09-21 ENCOUNTER — Other Ambulatory Visit: Payer: Self-pay | Admitting: Urology

## 2019-09-21 ENCOUNTER — Other Ambulatory Visit: Payer: Self-pay | Admitting: Cardiology

## 2019-09-23 ENCOUNTER — Ambulatory Visit: Payer: Medicare HMO | Admitting: Urology

## 2019-09-23 DIAGNOSIS — Z87891 Personal history of nicotine dependence: Secondary | ICD-10-CM | POA: Diagnosis not present

## 2019-09-23 DIAGNOSIS — J449 Chronic obstructive pulmonary disease, unspecified: Secondary | ICD-10-CM | POA: Diagnosis not present

## 2019-09-23 DIAGNOSIS — I5043 Acute on chronic combined systolic (congestive) and diastolic (congestive) heart failure: Secondary | ICD-10-CM | POA: Diagnosis not present

## 2019-09-25 DIAGNOSIS — R339 Retention of urine, unspecified: Secondary | ICD-10-CM | POA: Diagnosis not present

## 2019-09-25 DIAGNOSIS — R69 Illness, unspecified: Secondary | ICD-10-CM | POA: Diagnosis not present

## 2019-09-25 DIAGNOSIS — R3912 Poor urinary stream: Secondary | ICD-10-CM | POA: Diagnosis not present

## 2019-09-27 DIAGNOSIS — R69 Illness, unspecified: Secondary | ICD-10-CM | POA: Diagnosis not present

## 2019-10-06 ENCOUNTER — Telehealth: Payer: Self-pay | Admitting: *Deleted

## 2019-10-06 ENCOUNTER — Ambulatory Visit (INDEPENDENT_AMBULATORY_CARE_PROVIDER_SITE_OTHER): Payer: Medicare HMO

## 2019-10-06 ENCOUNTER — Other Ambulatory Visit: Payer: Self-pay | Admitting: Cardiology

## 2019-10-06 DIAGNOSIS — I6523 Occlusion and stenosis of bilateral carotid arteries: Secondary | ICD-10-CM

## 2019-10-06 NOTE — Telephone Encounter (Signed)
Pt aware - routed to pcp  

## 2019-10-06 NOTE — Telephone Encounter (Signed)
-----   Message from Antoine Poche, MD sent at 10/06/2019 12:50 PM EDT ----- Carotid US show mild to moderate blockages, continue to monitor at this time   Dominga Ferry MD

## 2019-10-13 DIAGNOSIS — Z681 Body mass index (BMI) 19 or less, adult: Secondary | ICD-10-CM | POA: Diagnosis not present

## 2019-10-13 DIAGNOSIS — J449 Chronic obstructive pulmonary disease, unspecified: Secondary | ICD-10-CM | POA: Diagnosis not present

## 2019-10-13 DIAGNOSIS — I5021 Acute systolic (congestive) heart failure: Secondary | ICD-10-CM | POA: Diagnosis not present

## 2019-10-13 DIAGNOSIS — Z1389 Encounter for screening for other disorder: Secondary | ICD-10-CM | POA: Diagnosis not present

## 2019-10-13 DIAGNOSIS — J22 Unspecified acute lower respiratory infection: Secondary | ICD-10-CM | POA: Diagnosis not present

## 2019-10-21 ENCOUNTER — Encounter: Payer: Self-pay | Admitting: Urology

## 2019-10-21 ENCOUNTER — Ambulatory Visit (INDEPENDENT_AMBULATORY_CARE_PROVIDER_SITE_OTHER): Payer: Medicare HMO | Admitting: Urology

## 2019-10-21 ENCOUNTER — Other Ambulatory Visit: Payer: Self-pay

## 2019-10-21 VITALS — BP 109/64 | HR 71 | Temp 98.3°F | Ht 66.0 in | Wt 124.0 lb

## 2019-10-21 DIAGNOSIS — R69 Illness, unspecified: Secondary | ICD-10-CM | POA: Diagnosis not present

## 2019-10-21 DIAGNOSIS — N401 Enlarged prostate with lower urinary tract symptoms: Secondary | ICD-10-CM | POA: Diagnosis not present

## 2019-10-21 DIAGNOSIS — N138 Other obstructive and reflux uropathy: Secondary | ICD-10-CM

## 2019-10-21 DIAGNOSIS — R339 Retention of urine, unspecified: Secondary | ICD-10-CM | POA: Diagnosis not present

## 2019-10-21 LAB — BLADDER SCAN AMB NON-IMAGING: Scan Result: 8.9
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
## 2019-10-21 NOTE — Progress Notes (Signed)
Urological Symptom Review  Patient is experiencing the following symptoms: none   Review of Systems  Gastrointestinal (upper)  : Negative for upper GI symptoms  Gastrointestinal (lower) : Negative for lower GI symptoms  Constitutional : Negative for symptoms  Skin: Negative for skin symptoms  Eyes: Negative for eye symptoms  Ear/Nose/Throat : Negative for Ear/Nose/Throat symptoms  Hematologic/Lymphatic: Negative for Hematologic/Lymphatic symptoms  Cardiovascular : Negative for cardiovascular symptoms  Respiratory : Cough  Endocrine: Negative for endocrine symptoms  Musculoskeletal: Negative for musculoskeletal symptoms  Neurological: Negative for neurological symptoms  Psychologic: Negative for psychiatric symptoms  

## 2019-10-21 NOTE — Patient Instructions (Signed)

## 2019-10-22 DIAGNOSIS — R7309 Other abnormal glucose: Secondary | ICD-10-CM | POA: Diagnosis not present

## 2019-10-22 DIAGNOSIS — Z1389 Encounter for screening for other disorder: Secondary | ICD-10-CM | POA: Diagnosis not present

## 2019-10-22 DIAGNOSIS — I4891 Unspecified atrial fibrillation: Secondary | ICD-10-CM | POA: Diagnosis not present

## 2019-10-22 DIAGNOSIS — Z0000 Encounter for general adult medical examination without abnormal findings: Secondary | ICD-10-CM | POA: Diagnosis not present

## 2019-10-22 DIAGNOSIS — E782 Mixed hyperlipidemia: Secondary | ICD-10-CM | POA: Diagnosis not present

## 2019-10-22 DIAGNOSIS — J449 Chronic obstructive pulmonary disease, unspecified: Secondary | ICD-10-CM | POA: Diagnosis not present

## 2019-10-22 DIAGNOSIS — Z0001 Encounter for general adult medical examination with abnormal findings: Secondary | ICD-10-CM | POA: Diagnosis not present

## 2019-10-22 DIAGNOSIS — Z681 Body mass index (BMI) 19 or less, adult: Secondary | ICD-10-CM | POA: Diagnosis not present

## 2019-10-22 DIAGNOSIS — E441 Mild protein-calorie malnutrition: Secondary | ICD-10-CM | POA: Diagnosis not present

## 2019-10-22 LAB — URINALYSIS, ROUTINE W REFLEX MICROSCOPIC
Bilirubin, UA: NEGATIVE
Glucose, UA: NEGATIVE
Ketones, UA: NEGATIVE
Leukocytes,UA: NEGATIVE
Nitrite, UA: NEGATIVE
Protein,UA: NEGATIVE
Specific Gravity, UA: 1.01 (ref 1.005–1.030)
Urobilinogen, Ur: 0.2 mg/dL (ref 0.2–1.0)
pH, UA: 6.5 (ref 5.0–7.5)

## 2019-10-22 LAB — MICROSCOPIC EXAMINATION
Bacteria, UA: NONE SEEN
Epithelial Cells (non renal): NONE SEEN /hpf (ref 0–10)
RBC, Urine: NONE SEEN /hpf (ref 0–2)
Renal Epithel, UA: NONE SEEN /hpf
WBC, UA: NONE SEEN /hpf (ref 0–5)

## 2019-10-24 DIAGNOSIS — R69 Illness, unspecified: Secondary | ICD-10-CM | POA: Diagnosis not present

## 2019-10-25 DIAGNOSIS — R3912 Poor urinary stream: Secondary | ICD-10-CM | POA: Diagnosis not present

## 2019-10-25 DIAGNOSIS — R339 Retention of urine, unspecified: Secondary | ICD-10-CM | POA: Diagnosis not present

## 2019-11-06 ENCOUNTER — Other Ambulatory Visit: Payer: Self-pay | Admitting: Cardiology

## 2019-11-08 ENCOUNTER — Other Ambulatory Visit: Payer: Self-pay | Admitting: Cardiology

## 2019-11-12 DIAGNOSIS — R69 Illness, unspecified: Secondary | ICD-10-CM | POA: Diagnosis not present

## 2019-11-13 DIAGNOSIS — J449 Chronic obstructive pulmonary disease, unspecified: Secondary | ICD-10-CM | POA: Diagnosis not present

## 2019-11-13 DIAGNOSIS — I5021 Acute systolic (congestive) heart failure: Secondary | ICD-10-CM | POA: Diagnosis not present

## 2019-11-25 DIAGNOSIS — R339 Retention of urine, unspecified: Secondary | ICD-10-CM | POA: Diagnosis not present

## 2019-11-25 DIAGNOSIS — R3912 Poor urinary stream: Secondary | ICD-10-CM | POA: Diagnosis not present

## 2019-11-30 DIAGNOSIS — R69 Illness, unspecified: Secondary | ICD-10-CM | POA: Diagnosis not present

## 2019-12-14 DIAGNOSIS — I5021 Acute systolic (congestive) heart failure: Secondary | ICD-10-CM | POA: Diagnosis not present

## 2019-12-14 DIAGNOSIS — J449 Chronic obstructive pulmonary disease, unspecified: Secondary | ICD-10-CM | POA: Diagnosis not present

## 2019-12-21 ENCOUNTER — Other Ambulatory Visit: Payer: Self-pay | Admitting: Urology

## 2019-12-21 DIAGNOSIS — R69 Illness, unspecified: Secondary | ICD-10-CM | POA: Diagnosis not present

## 2019-12-24 DIAGNOSIS — M1990 Unspecified osteoarthritis, unspecified site: Secondary | ICD-10-CM | POA: Diagnosis not present

## 2019-12-24 DIAGNOSIS — K219 Gastro-esophageal reflux disease without esophagitis: Secondary | ICD-10-CM | POA: Diagnosis not present

## 2019-12-24 DIAGNOSIS — J449 Chronic obstructive pulmonary disease, unspecified: Secondary | ICD-10-CM | POA: Diagnosis not present

## 2019-12-25 DIAGNOSIS — R339 Retention of urine, unspecified: Secondary | ICD-10-CM | POA: Diagnosis not present

## 2019-12-25 DIAGNOSIS — R3912 Poor urinary stream: Secondary | ICD-10-CM | POA: Diagnosis not present

## 2019-12-28 ENCOUNTER — Other Ambulatory Visit: Payer: Self-pay | Admitting: Cardiology

## 2020-01-11 DIAGNOSIS — R69 Illness, unspecified: Secondary | ICD-10-CM | POA: Diagnosis not present

## 2020-01-13 DIAGNOSIS — J449 Chronic obstructive pulmonary disease, unspecified: Secondary | ICD-10-CM | POA: Diagnosis not present

## 2020-01-13 DIAGNOSIS — I5021 Acute systolic (congestive) heart failure: Secondary | ICD-10-CM | POA: Diagnosis not present

## 2020-01-26 DIAGNOSIS — R339 Retention of urine, unspecified: Secondary | ICD-10-CM | POA: Diagnosis not present

## 2020-01-26 DIAGNOSIS — R3912 Poor urinary stream: Secondary | ICD-10-CM | POA: Diagnosis not present

## 2020-01-27 DIAGNOSIS — R339 Retention of urine, unspecified: Secondary | ICD-10-CM | POA: Diagnosis not present

## 2020-01-27 DIAGNOSIS — R3912 Poor urinary stream: Secondary | ICD-10-CM | POA: Diagnosis not present

## 2020-01-28 DIAGNOSIS — R3912 Poor urinary stream: Secondary | ICD-10-CM | POA: Diagnosis not present

## 2020-01-28 DIAGNOSIS — R339 Retention of urine, unspecified: Secondary | ICD-10-CM | POA: Diagnosis not present

## 2020-02-02 DIAGNOSIS — R69 Illness, unspecified: Secondary | ICD-10-CM | POA: Diagnosis not present

## 2020-02-03 DIAGNOSIS — R69 Illness, unspecified: Secondary | ICD-10-CM | POA: Diagnosis not present

## 2020-02-13 DIAGNOSIS — J449 Chronic obstructive pulmonary disease, unspecified: Secondary | ICD-10-CM | POA: Diagnosis not present

## 2020-02-13 DIAGNOSIS — I5021 Acute systolic (congestive) heart failure: Secondary | ICD-10-CM | POA: Diagnosis not present

## 2020-02-17 DIAGNOSIS — R69 Illness, unspecified: Secondary | ICD-10-CM | POA: Diagnosis not present

## 2020-02-23 DIAGNOSIS — M1990 Unspecified osteoarthritis, unspecified site: Secondary | ICD-10-CM | POA: Diagnosis not present

## 2020-02-23 DIAGNOSIS — K219 Gastro-esophageal reflux disease without esophagitis: Secondary | ICD-10-CM | POA: Diagnosis not present

## 2020-02-23 DIAGNOSIS — J449 Chronic obstructive pulmonary disease, unspecified: Secondary | ICD-10-CM | POA: Diagnosis not present

## 2020-02-25 DIAGNOSIS — R3912 Poor urinary stream: Secondary | ICD-10-CM | POA: Diagnosis not present

## 2020-02-25 DIAGNOSIS — R339 Retention of urine, unspecified: Secondary | ICD-10-CM | POA: Diagnosis not present

## 2020-02-26 DIAGNOSIS — R69 Illness, unspecified: Secondary | ICD-10-CM | POA: Diagnosis not present

## 2020-03-14 DIAGNOSIS — J449 Chronic obstructive pulmonary disease, unspecified: Secondary | ICD-10-CM | POA: Diagnosis not present

## 2020-03-14 DIAGNOSIS — I5021 Acute systolic (congestive) heart failure: Secondary | ICD-10-CM | POA: Diagnosis not present

## 2020-03-16 ENCOUNTER — Ambulatory Visit: Payer: Medicare HMO | Admitting: Cardiology

## 2020-03-16 ENCOUNTER — Encounter: Payer: Self-pay | Admitting: *Deleted

## 2020-03-16 ENCOUNTER — Encounter: Payer: Self-pay | Admitting: Cardiology

## 2020-03-16 VITALS — BP 116/72 | HR 60 | Ht 66.0 in | Wt 122.8 lb

## 2020-03-16 DIAGNOSIS — I2510 Atherosclerotic heart disease of native coronary artery without angina pectoris: Secondary | ICD-10-CM | POA: Diagnosis not present

## 2020-03-16 DIAGNOSIS — I6523 Occlusion and stenosis of bilateral carotid arteries: Secondary | ICD-10-CM | POA: Diagnosis not present

## 2020-03-16 DIAGNOSIS — I5022 Chronic systolic (congestive) heart failure: Secondary | ICD-10-CM | POA: Diagnosis not present

## 2020-03-16 NOTE — Patient Instructions (Signed)
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
## 2020-03-21 ENCOUNTER — Other Ambulatory Visit: Payer: Self-pay | Admitting: Urology

## 2020-03-21 ENCOUNTER — Observation Stay (HOSPITAL_COMMUNITY)
Admission: EM | Admit: 2020-03-21 | Discharge: 2020-03-21 | Disposition: A | Discharge status: Home / Self Care | Payer: Medicare HMO | Attending: Family Medicine | Admitting: Family Medicine

## 2020-03-21 ENCOUNTER — Emergency Department (HOSPITAL_COMMUNITY): Payer: Medicare HMO

## 2020-03-21 ENCOUNTER — Other Ambulatory Visit: Payer: Self-pay

## 2020-03-21 ENCOUNTER — Encounter (HOSPITAL_COMMUNITY): Payer: Self-pay | Admitting: Emergency Medicine

## 2020-03-21 DIAGNOSIS — Z7982 Long term (current) use of aspirin: Secondary | ICD-10-CM | POA: Diagnosis not present

## 2020-03-21 DIAGNOSIS — I2510 Atherosclerotic heart disease of native coronary artery without angina pectoris: Secondary | ICD-10-CM | POA: Diagnosis present

## 2020-03-21 DIAGNOSIS — Z79899 Other long term (current) drug therapy: Secondary | ICD-10-CM | POA: Insufficient documentation

## 2020-03-21 DIAGNOSIS — I4891 Unspecified atrial fibrillation: Secondary | ICD-10-CM | POA: Insufficient documentation

## 2020-03-21 DIAGNOSIS — R0902 Hypoxemia: Secondary | ICD-10-CM

## 2020-03-21 DIAGNOSIS — J45909 Unspecified asthma, uncomplicated: Secondary | ICD-10-CM | POA: Insufficient documentation

## 2020-03-21 DIAGNOSIS — J449 Chronic obstructive pulmonary disease, unspecified: Secondary | ICD-10-CM | POA: Diagnosis not present

## 2020-03-21 DIAGNOSIS — J9611 Chronic respiratory failure with hypoxia: Secondary | ICD-10-CM | POA: Diagnosis present

## 2020-03-21 DIAGNOSIS — R69 Illness, unspecified: Secondary | ICD-10-CM | POA: Diagnosis not present

## 2020-03-21 DIAGNOSIS — R0602 Shortness of breath: Secondary | ICD-10-CM

## 2020-03-21 DIAGNOSIS — I480 Paroxysmal atrial fibrillation: Secondary | ICD-10-CM | POA: Diagnosis present

## 2020-03-21 DIAGNOSIS — I110 Hypertensive heart disease with heart failure: Secondary | ICD-10-CM | POA: Insufficient documentation

## 2020-03-21 DIAGNOSIS — Z951 Presence of aortocoronary bypass graft: Secondary | ICD-10-CM

## 2020-03-21 DIAGNOSIS — R059 Cough, unspecified: Secondary | ICD-10-CM | POA: Diagnosis not present

## 2020-03-21 DIAGNOSIS — I5022 Chronic systolic (congestive) heart failure: Secondary | ICD-10-CM | POA: Diagnosis not present

## 2020-03-21 DIAGNOSIS — Z87891 Personal history of nicotine dependence: Secondary | ICD-10-CM | POA: Diagnosis not present

## 2020-03-21 DIAGNOSIS — U071 COVID-19: Principal | ICD-10-CM | POA: Diagnosis present

## 2020-03-21 DIAGNOSIS — J069 Acute upper respiratory infection, unspecified: Secondary | ICD-10-CM | POA: Diagnosis present

## 2020-03-21 LAB — RESP PANEL BY RT-PCR (FLU A&B, COVID) ARPGX2
Influenza A by PCR: NEGATIVE
Influenza B by PCR: NEGATIVE
SARS Coronavirus 2 by RT PCR: POSITIVE — AB

## 2020-03-21 LAB — CBC WITH DIFFERENTIAL/PLATELET
Abs Immature Granulocytes: 0.03 10*3/uL (ref 0.00–0.07)
Basophils Absolute: 0 10*3/uL (ref 0.0–0.1)
Basophils Relative: 0 %
Eosinophils Absolute: 0 10*3/uL (ref 0.0–0.5)
Eosinophils Relative: 1 %
HCT: 33.4 % — ABNORMAL LOW (ref 39.0–52.0)
Hemoglobin: 10.7 g/dL — ABNORMAL LOW (ref 13.0–17.0)
Immature Granulocytes: 1 %
Lymphocytes Relative: 13 %
Lymphs Abs: 0.8 10*3/uL (ref 0.7–4.0)
MCH: 31.8 pg (ref 26.0–34.0)
MCHC: 32 g/dL (ref 30.0–36.0)
MCV: 99.1 fL (ref 80.0–100.0)
Monocytes Absolute: 0.9 10*3/uL (ref 0.1–1.0)
Monocytes Relative: 14 %
Neutro Abs: 4.4 10*3/uL (ref 1.7–7.7)
Neutrophils Relative %: 71 %
Platelets: 204 10*3/uL (ref 150–400)
RBC: 3.37 MIL/uL — ABNORMAL LOW (ref 4.22–5.81)
RDW: 13.8 % (ref 11.5–15.5)
WBC: 6.1 10*3/uL (ref 4.0–10.5)
nRBC: 0 % (ref 0.0–0.2)

## 2020-03-21 LAB — BASIC METABOLIC PANEL
Anion gap: 7 (ref 5–15)
BUN: 9 mg/dL (ref 8–23)
CO2: 24 mmol/L (ref 22–32)
Calcium: 8.4 mg/dL — ABNORMAL LOW (ref 8.9–10.3)
Chloride: 101 mmol/L (ref 98–111)
Creatinine, Ser: 0.74 mg/dL (ref 0.61–1.24)
GFR, Estimated: >60 mL/min (ref >60)
Glucose, Bld: 95 mg/dL (ref 70–99)
Potassium: 3.5 mmol/L (ref 3.5–5.1)
Sodium: 132 mmol/L — ABNORMAL LOW (ref 135–145)

## 2020-03-21 MED ORDER — ASPIRIN EC 81 MG PO TBEC: 81.0000 mg | DELAYED_RELEASE_TABLET | Freq: Every day | ORAL | 2 refills | Status: AC

## 2020-03-21 MED ORDER — DIPHENHYDRAMINE HCL 50 MG/ML IJ SOLN: 50.0000 mg | Freq: Once | INTRAMUSCULAR | Status: DC | PRN

## 2020-03-21 MED ORDER — FAMOTIDINE IN NACL 20-0.9 MG/50ML-% IV SOLN: 20.0000 mg | Freq: Once | INTRAVENOUS | Status: DC | PRN

## 2020-03-21 MED ORDER — METHYLPREDNISOLONE SODIUM SUCC 125 MG IJ SOLR: 125.0000 mg | Freq: Once | INTRAMUSCULAR | Status: DC

## 2020-03-21 MED ORDER — METHYLPREDNISOLONE SODIUM SUCC 125 MG IJ SOLR: 125.0000 mg | Freq: Once | INTRAMUSCULAR | Status: DC | PRN

## 2020-03-21 MED ORDER — SODIUM CHLORIDE 0.9 % IV SOLN
100.0000 mg | INTRAVENOUS | Status: DC
  Administered 2020-03-21: 100 mg via INTRAVENOUS
  Filled 2020-03-21: qty 20

## 2020-03-21 MED ORDER — ALBUTEROL SULFATE HFA 108 (90 BASE) MCG/ACT IN AERS: 2.0000 | INHALATION_SPRAY | Freq: Four times a day (QID) | RESPIRATORY_TRACT | 1 refills | Status: DC | PRN

## 2020-03-21 MED ORDER — ALBUTEROL SULFATE HFA 108 (90 BASE) MCG/ACT IN AERS: 2.0000 | INHALATION_SPRAY | Freq: Once | RESPIRATORY_TRACT | Status: DC | PRN

## 2020-03-21 MED ORDER — PREDNISONE 20 MG PO TABS: 40.0000 mg | ORAL_TABLET | ORAL | 0 refills | Status: DC

## 2020-03-21 MED ORDER — EPINEPHRINE 0.3 MG/0.3ML IJ SOAJ: 0.3000 mg | Freq: Once | INTRAMUSCULAR | Status: DC | PRN

## 2020-03-21 MED ORDER — BUDESONIDE 0.25 MG/2ML IN SUSP: 0.2500 mg | Freq: Two times a day (BID) | RESPIRATORY_TRACT | 12 refills | Status: DC

## 2020-03-21 MED ORDER — ALBUTEROL SULFATE HFA 108 (90 BASE) MCG/ACT IN AERS
2.0000 | INHALATION_SPRAY | Freq: Once | RESPIRATORY_TRACT | Status: AC
  Administered 2020-03-21: 2 via RESPIRATORY_TRACT
  Filled 2020-03-21: qty 6.7

## 2020-03-21 MED ORDER — SODIUM CHLORIDE 0.9 % IV SOLN
1200.0000 mg | Freq: Once | INTRAVENOUS | Status: AC
  Administered 2020-03-21: 1200 mg via INTRAVENOUS
  Filled 2020-03-21: qty 10

## 2020-03-21 MED ORDER — METHYLPREDNISOLONE SODIUM SUCC 125 MG IJ SOLR
125.0000 mg | Freq: Once | INTRAMUSCULAR | Status: AC
  Administered 2020-03-21: 125 mg via INTRAVENOUS
  Filled 2020-03-21: qty 2

## 2020-03-21 MED ORDER — GUAIFENESIN ER 600 MG PO TB12: 600.0000 mg | ORAL_TABLET | Freq: Two times a day (BID) | ORAL | 0 refills | Status: AC

## 2020-03-21 MED ORDER — IPRATROPIUM BROMIDE 0.02 % IN SOLN: 0.5000 mg | Freq: Two times a day (BID) | RESPIRATORY_TRACT | 3 refills | Status: DC

## 2020-03-21 MED ORDER — SODIUM CHLORIDE 0.9 % IV SOLN: INTRAVENOUS | Status: DC | PRN

## 2020-03-21 MED ORDER — DEXAMETHASONE SODIUM PHOSPHATE 10 MG/ML IJ SOLN
6.0000 mg | Freq: Once | INTRAMUSCULAR | Status: AC
  Administered 2020-03-21: 6 mg via INTRAVENOUS
  Filled 2020-03-21: qty 1

## 2020-03-21 MED ORDER — SODIUM CHLORIDE 0.9 % IV SOLN: 100.0000 mg | Freq: Every day | INTRAVENOUS | Status: DC

## 2020-03-21 MED ORDER — PANTOPRAZOLE SODIUM 40 MG PO TBEC: 40.0000 mg | DELAYED_RELEASE_TABLET | Freq: Every day | ORAL | 1 refills | Status: DC

## 2020-03-21 NOTE — ED Notes (Signed)
Pt ambulated in room with 1.5L [place]. O2 dropped to 93%.

## 2020-03-21 NOTE — ED Notes (Signed)
Pt ambulated in room, oxygen levels down to 87% on RA. C/o SOB. Pt got back into bed and oxygen levels increased to 100% on RA after resting for a couple of minutes.
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
## 2020-03-21 NOTE — ED Notes (Signed)
Date and time results received: 03/21/20 10:54 AM   Test: COVID-19 PCR  Critical Value: Positive   Name of Provider Notified: Zackowski   Orders Received? Or Actions Taken?: precautions
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
## 2020-03-21 NOTE — ED Triage Notes (Signed)
Pt c/o of sob x 2 weeks with productive cough

## 2020-03-25 DIAGNOSIS — J449 Chronic obstructive pulmonary disease, unspecified: Secondary | ICD-10-CM | POA: Diagnosis not present

## 2020-03-25 DIAGNOSIS — K219 Gastro-esophageal reflux disease without esophagitis: Secondary | ICD-10-CM | POA: Diagnosis not present

## 2020-03-25 DIAGNOSIS — M1990 Unspecified osteoarthritis, unspecified site: Secondary | ICD-10-CM | POA: Diagnosis not present

## 2020-04-14 DIAGNOSIS — J449 Chronic obstructive pulmonary disease, unspecified: Secondary | ICD-10-CM | POA: Diagnosis not present

## 2020-04-14 DIAGNOSIS — I5021 Acute systolic (congestive) heart failure: Secondary | ICD-10-CM | POA: Diagnosis not present

## 2020-04-23 DIAGNOSIS — J449 Chronic obstructive pulmonary disease, unspecified: Secondary | ICD-10-CM | POA: Diagnosis not present

## 2020-04-23 DIAGNOSIS — M1990 Unspecified osteoarthritis, unspecified site: Secondary | ICD-10-CM | POA: Diagnosis not present

## 2020-04-23 DIAGNOSIS — K219 Gastro-esophageal reflux disease without esophagitis: Secondary | ICD-10-CM | POA: Diagnosis not present

## 2020-05-01 ENCOUNTER — Other Ambulatory Visit: Payer: Self-pay | Admitting: Cardiology

## 2020-05-15 DIAGNOSIS — J449 Chronic obstructive pulmonary disease, unspecified: Secondary | ICD-10-CM | POA: Diagnosis not present

## 2020-05-15 DIAGNOSIS — I5021 Acute systolic (congestive) heart failure: Secondary | ICD-10-CM | POA: Diagnosis not present

## 2020-05-26 ENCOUNTER — Encounter (HOSPITAL_COMMUNITY): Payer: Self-pay

## 2020-05-26 ENCOUNTER — Inpatient Hospital Stay (HOSPITAL_COMMUNITY)
Admission: EM | Admit: 2020-05-26 | Discharge: 2020-05-28 | DRG: 190 | Disposition: A | Discharge status: Home / Self Care | Payer: Medicare HMO | Attending: Internal Medicine | Admitting: Internal Medicine

## 2020-05-26 ENCOUNTER — Emergency Department (HOSPITAL_COMMUNITY): Payer: Medicare HMO

## 2020-05-26 ENCOUNTER — Other Ambulatory Visit: Payer: Self-pay

## 2020-05-26 DIAGNOSIS — Z7952 Long term (current) use of systemic steroids: Secondary | ICD-10-CM

## 2020-05-26 DIAGNOSIS — Z820 Family history of epilepsy and other diseases of the nervous system: Secondary | ICD-10-CM | POA: Diagnosis not present

## 2020-05-26 DIAGNOSIS — I252 Old myocardial infarction: Secondary | ICD-10-CM

## 2020-05-26 DIAGNOSIS — Z9981 Dependence on supplemental oxygen: Secondary | ICD-10-CM | POA: Diagnosis not present

## 2020-05-26 DIAGNOSIS — Z8701 Personal history of pneumonia (recurrent): Secondary | ICD-10-CM

## 2020-05-26 DIAGNOSIS — N401 Enlarged prostate with lower urinary tract symptoms: Secondary | ICD-10-CM | POA: Diagnosis not present

## 2020-05-26 DIAGNOSIS — R0689 Other abnormalities of breathing: Secondary | ICD-10-CM | POA: Diagnosis not present

## 2020-05-26 DIAGNOSIS — I480 Paroxysmal atrial fibrillation: Secondary | ICD-10-CM | POA: Diagnosis not present

## 2020-05-26 DIAGNOSIS — K219 Gastro-esophageal reflux disease without esophagitis: Secondary | ICD-10-CM | POA: Diagnosis present

## 2020-05-26 DIAGNOSIS — Z7982 Long term (current) use of aspirin: Secondary | ICD-10-CM

## 2020-05-26 DIAGNOSIS — Z79899 Other long term (current) drug therapy: Secondary | ICD-10-CM | POA: Diagnosis not present

## 2020-05-26 DIAGNOSIS — Z8249 Family history of ischemic heart disease and other diseases of the circulatory system: Secondary | ICD-10-CM | POA: Diagnosis not present

## 2020-05-26 DIAGNOSIS — Z23 Encounter for immunization: Secondary | ICD-10-CM | POA: Diagnosis not present

## 2020-05-26 DIAGNOSIS — R0902 Hypoxemia: Secondary | ICD-10-CM | POA: Diagnosis not present

## 2020-05-26 DIAGNOSIS — N320 Bladder-neck obstruction: Secondary | ICD-10-CM | POA: Diagnosis present

## 2020-05-26 DIAGNOSIS — J471 Bronchiectasis with (acute) exacerbation: Secondary | ICD-10-CM | POA: Diagnosis not present

## 2020-05-26 DIAGNOSIS — I110 Hypertensive heart disease with heart failure: Secondary | ICD-10-CM | POA: Diagnosis present

## 2020-05-26 DIAGNOSIS — Z20822 Contact with and (suspected) exposure to covid-19: Secondary | ICD-10-CM | POA: Diagnosis present

## 2020-05-26 DIAGNOSIS — R059 Cough, unspecified: Secondary | ICD-10-CM

## 2020-05-26 DIAGNOSIS — A419 Sepsis, unspecified organism: Secondary | ICD-10-CM

## 2020-05-26 DIAGNOSIS — I5042 Chronic combined systolic (congestive) and diastolic (congestive) heart failure: Secondary | ICD-10-CM | POA: Diagnosis not present

## 2020-05-26 DIAGNOSIS — Z7951 Long term (current) use of inhaled steroids: Secondary | ICD-10-CM | POA: Diagnosis not present

## 2020-05-26 DIAGNOSIS — R410 Disorientation, unspecified: Secondary | ICD-10-CM | POA: Diagnosis not present

## 2020-05-26 DIAGNOSIS — R1310 Dysphagia, unspecified: Secondary | ICD-10-CM | POA: Diagnosis not present

## 2020-05-26 DIAGNOSIS — J441 Chronic obstructive pulmonary disease with (acute) exacerbation: Secondary | ICD-10-CM | POA: Diagnosis not present

## 2020-05-26 DIAGNOSIS — R4182 Altered mental status, unspecified: Secondary | ICD-10-CM | POA: Diagnosis not present

## 2020-05-26 DIAGNOSIS — J9601 Acute respiratory failure with hypoxia: Secondary | ICD-10-CM | POA: Diagnosis present

## 2020-05-26 DIAGNOSIS — Z8616 Personal history of COVID-19: Secondary | ICD-10-CM | POA: Diagnosis not present

## 2020-05-26 DIAGNOSIS — Z951 Presence of aortocoronary bypass graft: Secondary | ICD-10-CM | POA: Diagnosis not present

## 2020-05-26 DIAGNOSIS — Z743 Need for continuous supervision: Secondary | ICD-10-CM | POA: Diagnosis not present

## 2020-05-26 DIAGNOSIS — N138 Other obstructive and reflux uropathy: Secondary | ICD-10-CM | POA: Diagnosis not present

## 2020-05-26 DIAGNOSIS — R339 Retention of urine, unspecified: Secondary | ICD-10-CM | POA: Diagnosis not present

## 2020-05-26 DIAGNOSIS — I10 Essential (primary) hypertension: Secondary | ICD-10-CM | POA: Diagnosis not present

## 2020-05-26 DIAGNOSIS — I2510 Atherosclerotic heart disease of native coronary artery without angina pectoris: Secondary | ICD-10-CM | POA: Diagnosis present

## 2020-05-26 DIAGNOSIS — R0602 Shortness of breath: Secondary | ICD-10-CM | POA: Diagnosis not present

## 2020-05-26 DIAGNOSIS — J9600 Acute respiratory failure, unspecified whether with hypoxia or hypercapnia: Secondary | ICD-10-CM | POA: Diagnosis present

## 2020-05-26 DIAGNOSIS — E860 Dehydration: Secondary | ICD-10-CM | POA: Diagnosis not present

## 2020-05-26 DIAGNOSIS — Z87891 Personal history of nicotine dependence: Secondary | ICD-10-CM

## 2020-05-26 DIAGNOSIS — R3912 Poor urinary stream: Secondary | ICD-10-CM | POA: Diagnosis not present

## 2020-05-26 DIAGNOSIS — R531 Weakness: Secondary | ICD-10-CM | POA: Diagnosis not present

## 2020-05-26 DIAGNOSIS — R509 Fever, unspecified: Secondary | ICD-10-CM | POA: Diagnosis not present

## 2020-05-26 LAB — RESP PANEL BY RT-PCR (FLU A&B, COVID) ARPGX2
Influenza A by PCR: NEGATIVE
Influenza B by PCR: NEGATIVE
SARS Coronavirus 2 by RT PCR: NEGATIVE

## 2020-05-26 LAB — CBC WITH DIFFERENTIAL/PLATELET
Abs Immature Granulocytes: 0.11 K/uL — ABNORMAL HIGH (ref 0.00–0.07)
Basophils Absolute: 0 K/uL (ref 0.0–0.1)
Basophils Relative: 0 %
Eosinophils Absolute: 0 K/uL (ref 0.0–0.5)
Eosinophils Relative: 0 %
HCT: 34.1 % — ABNORMAL LOW (ref 39.0–52.0)
Hemoglobin: 11 g/dL — ABNORMAL LOW (ref 13.0–17.0)
Immature Granulocytes: 1 %
Lymphocytes Relative: 11 %
Lymphs Abs: 1.8 K/uL (ref 0.7–4.0)
MCH: 31.6 pg (ref 26.0–34.0)
MCHC: 32.3 g/dL (ref 30.0–36.0)
MCV: 98 fL (ref 80.0–100.0)
Monocytes Absolute: 1.9 K/uL — ABNORMAL HIGH (ref 0.1–1.0)
Monocytes Relative: 12 %
Neutro Abs: 12.1 K/uL — ABNORMAL HIGH (ref 1.7–7.7)
Neutrophils Relative %: 76 %
Platelets: 219 K/uL (ref 150–400)
RBC: 3.48 MIL/uL — ABNORMAL LOW (ref 4.22–5.81)
RDW: 15.2 % (ref 11.5–15.5)
WBC: 16 K/uL — ABNORMAL HIGH (ref 4.0–10.5)
nRBC: 0 % (ref 0.0–0.2)

## 2020-05-26 LAB — COMPREHENSIVE METABOLIC PANEL WITH GFR
ALT: 12 U/L (ref 0–44)
AST: 14 U/L — ABNORMAL LOW (ref 15–41)
Albumin: 3.3 g/dL — ABNORMAL LOW (ref 3.5–5.0)
Alkaline Phosphatase: 52 U/L (ref 38–126)
Anion gap: 8 (ref 5–15)
BUN: 12 mg/dL (ref 8–23)
CO2: 27 mmol/L (ref 22–32)
Calcium: 8.3 mg/dL — ABNORMAL LOW (ref 8.9–10.3)
Chloride: 99 mmol/L (ref 98–111)
Creatinine, Ser: 0.85 mg/dL (ref 0.61–1.24)
GFR, Estimated: >60 mL/min
Glucose, Bld: 114 mg/dL — ABNORMAL HIGH (ref 70–99)
Potassium: 3.6 mmol/L (ref 3.5–5.1)
Sodium: 134 mmol/L — ABNORMAL LOW (ref 135–145)
Total Bilirubin: 0.8 mg/dL (ref 0.3–1.2)
Total Protein: 6.3 g/dL — ABNORMAL LOW (ref 6.5–8.1)

## 2020-05-26 LAB — URINALYSIS, ROUTINE W REFLEX MICROSCOPIC
Bilirubin Urine: NEGATIVE
Glucose, UA: NEGATIVE mg/dL
Ketones, ur: 5 mg/dL — AB
Nitrite: NEGATIVE
Protein, ur: NEGATIVE mg/dL
Specific Gravity, Urine: 1.014 (ref 1.005–1.030)
pH: 6 (ref 5.0–8.0)

## 2020-05-26 LAB — LACTIC ACID, PLASMA
Lactic Acid, Venous: 1.1 mmol/L (ref 0.5–1.9)
Lactic Acid, Venous: 0.8 mmol/L (ref 0.5–1.9)

## 2020-05-26 LAB — PROTIME-INR
INR: 1.1 (ref 0.8–1.2)
Prothrombin Time: 13.9 s (ref 11.4–15.2)

## 2020-05-26 LAB — BRAIN NATRIURETIC PEPTIDE: B Natriuretic Peptide: 161 pg/mL — ABNORMAL HIGH (ref 0.0–100.0)

## 2020-05-26 MED ORDER — METHYLPREDNISOLONE SODIUM SUCC 125 MG IJ SOLR
60.0000 mg | Freq: Three times a day (TID) | INTRAMUSCULAR | Status: DC
  Administered 2020-05-27 – 2020-05-28 (×5): 60 mg via INTRAVENOUS
  Filled 2020-05-26 (×5): qty 2

## 2020-05-26 MED ORDER — POTASSIUM CHLORIDE CRYS ER 20 MEQ PO TBCR
20.0000 meq | EXTENDED_RELEASE_TABLET | Freq: Every day | ORAL | Status: DC
  Administered 2020-05-27 – 2020-05-28 (×2): 20 meq via ORAL
  Filled 2020-05-26 (×2): qty 1

## 2020-05-26 MED ORDER — INFLUENZA VAC A&B SA ADJ QUAD 0.5 ML IM PRSY
0.5000 mL | PREFILLED_SYRINGE | INTRAMUSCULAR | Status: AC
  Administered 2020-05-28: 0.5 mL via INTRAMUSCULAR
  Filled 2020-05-26: qty 0.5

## 2020-05-26 MED ORDER — HYDROCOD POLST-CPM POLST ER 10-8 MG/5ML PO SUER: 5.0000 mL | Freq: Two times a day (BID) | ORAL | Status: DC | PRN

## 2020-05-26 MED ORDER — SACUBITRIL-VALSARTAN 24-26 MG PO TABS
1.0000 | ORAL_TABLET | Freq: Two times a day (BID) | ORAL | Status: DC
  Administered 2020-05-27 – 2020-05-28 (×4): 1 via ORAL
  Filled 2020-05-26 (×4): qty 1

## 2020-05-26 MED ORDER — METHYLPREDNISOLONE SODIUM SUCC 125 MG IJ SOLR
125.0000 mg | Freq: Once | INTRAMUSCULAR | Status: AC
  Administered 2020-05-26: 125 mg via INTRAVENOUS
  Filled 2020-05-26: qty 2

## 2020-05-26 MED ORDER — CARVEDILOL 3.125 MG PO TABS
3.1250 mg | ORAL_TABLET | Freq: Two times a day (BID) | ORAL | Status: DC
  Administered 2020-05-27 – 2020-05-28 (×3): 3.125 mg via ORAL
  Filled 2020-05-26 (×3): qty 1

## 2020-05-26 MED ORDER — ATORVASTATIN CALCIUM 40 MG PO TABS
40.0000 mg | ORAL_TABLET | Freq: Every day | ORAL | Status: DC
  Administered 2020-05-27: 40 mg via ORAL
  Filled 2020-05-26: qty 1

## 2020-05-26 MED ORDER — FINASTERIDE 5 MG PO TABS
5.0000 mg | ORAL_TABLET | Freq: Every day | ORAL | Status: DC
  Administered 2020-05-27 – 2020-05-28 (×2): 5 mg via ORAL
  Filled 2020-05-26 (×2): qty 1

## 2020-05-26 MED ORDER — ASPIRIN EC 81 MG PO TBEC
81.0000 mg | DELAYED_RELEASE_TABLET | Freq: Every day | ORAL | Status: DC
  Administered 2020-05-27 – 2020-05-28 (×2): 81 mg via ORAL
  Filled 2020-05-26 (×2): qty 1

## 2020-05-26 MED ORDER — POLYETHYLENE GLYCOL 3350 17 G PO PACK: 17.0000 g | PACK | Freq: Every day | ORAL | Status: DC | PRN

## 2020-05-26 MED ORDER — MAGNESIUM SULFATE 2 GM/50ML IV SOLN
2.0000 g | Freq: Once | INTRAVENOUS | Status: AC
  Administered 2020-05-26: 2 g via INTRAVENOUS
  Filled 2020-05-26: qty 50

## 2020-05-26 MED ORDER — LEVOFLOXACIN IN D5W 750 MG/150ML IV SOLN
750.0000 mg | INTRAVENOUS | Status: DC
  Filled 2020-05-26: qty 150

## 2020-05-26 MED ORDER — ALBUTEROL SULFATE HFA 108 (90 BASE) MCG/ACT IN AERS: 2.0000 | INHALATION_SPRAY | Freq: Four times a day (QID) | RESPIRATORY_TRACT | Status: DC | PRN

## 2020-05-26 MED ORDER — LEVOFLOXACIN IN D5W 500 MG/100ML IV SOLN
500.0000 mg | Freq: Once | INTRAVENOUS | Status: AC
  Administered 2020-05-26: 500 mg via INTRAVENOUS
  Filled 2020-05-26: qty 100

## 2020-05-26 MED ORDER — SODIUM CHLORIDE 0.9% FLUSH
3.0000 mL | Freq: Two times a day (BID) | INTRAVENOUS | Status: DC
  Administered 2020-05-27 (×3): 3 mL via INTRAVENOUS

## 2020-05-26 MED ORDER — SODIUM CHLORIDE 0.9 % IV BOLUS (SEPSIS)
1000.0000 mL | Freq: Once | INTRAVENOUS | Status: AC
  Administered 2020-05-26: 1000 mL via INTRAVENOUS

## 2020-05-26 MED ORDER — ALBUTEROL SULFATE HFA 108 (90 BASE) MCG/ACT IN AERS
2.0000 | INHALATION_SPRAY | Freq: Once | RESPIRATORY_TRACT | Status: AC
  Administered 2020-05-26: 2 via RESPIRATORY_TRACT
  Filled 2020-05-26: qty 6.7

## 2020-05-26 MED ORDER — ACETAMINOPHEN 325 MG PO TABS
650.0000 mg | ORAL_TABLET | Freq: Once | ORAL | Status: AC
  Administered 2020-05-26: 650 mg via ORAL
  Filled 2020-05-26: qty 2

## 2020-05-26 MED ORDER — BUDESONIDE 0.25 MG/2ML IN SUSP
0.2500 mg | Freq: Two times a day (BID) | RESPIRATORY_TRACT | Status: DC
  Administered 2020-05-26: 0.25 mg via RESPIRATORY_TRACT
  Filled 2020-05-26: qty 2

## 2020-05-26 MED ORDER — ALPRAZOLAM 0.5 MG PO TABS
0.5000 mg | ORAL_TABLET | Freq: Three times a day (TID) | ORAL | Status: DC | PRN
  Administered 2020-05-27: 0.5 mg via ORAL
  Filled 2020-05-26: qty 1

## 2020-05-26 MED ORDER — SODIUM CHLORIDE 0.9% FLUSH
3.0000 mL | INTRAVENOUS | Status: DC | PRN
  Administered 2020-05-27: 3 mL via INTRAVENOUS

## 2020-05-26 MED ORDER — PANTOPRAZOLE SODIUM 40 MG PO TBEC
40.0000 mg | DELAYED_RELEASE_TABLET | Freq: Every day | ORAL | Status: DC
  Administered 2020-05-27 – 2020-05-28 (×2): 40 mg via ORAL
  Filled 2020-05-26 (×2): qty 1

## 2020-05-26 MED ORDER — ENOXAPARIN SODIUM 40 MG/0.4ML ~~LOC~~ SOLN
40.0000 mg | SUBCUTANEOUS | Status: DC
  Administered 2020-05-27 (×2): 40 mg via SUBCUTANEOUS
  Filled 2020-05-26 (×2): qty 0.4

## 2020-05-26 MED ORDER — IPRATROPIUM BROMIDE 0.02 % IN SOLN
0.5000 mg | Freq: Two times a day (BID) | RESPIRATORY_TRACT | Status: DC
  Administered 2020-05-26: 0.5 mg via RESPIRATORY_TRACT
  Filled 2020-05-26: qty 2.5

## 2020-05-26 MED ORDER — FUROSEMIDE 40 MG PO TABS
40.0000 mg | ORAL_TABLET | Freq: Every day | ORAL | Status: DC
  Administered 2020-05-27 – 2020-05-28 (×2): 40 mg via ORAL
  Filled 2020-05-26 (×2): qty 1

## 2020-05-26 MED ORDER — LEVOFLOXACIN IN D5W 750 MG/150ML IV SOLN: 750.0000 mg | INTRAVENOUS | Status: DC

## 2020-05-26 MED ORDER — SODIUM CHLORIDE 0.9 % IV SOLN: 250.0000 mL | INTRAVENOUS | Status: DC | PRN

## 2020-05-26 MED ORDER — TAMSULOSIN HCL 0.4 MG PO CAPS
0.4000 mg | ORAL_CAPSULE | Freq: Every day | ORAL | Status: DC
  Administered 2020-05-27 (×2): 0.4 mg via ORAL
  Filled 2020-05-26 (×2): qty 1

## 2020-05-26 MED ORDER — PREDNISONE 20 MG PO TABS
40.0000 mg | ORAL_TABLET | Freq: Every day | ORAL | Status: DC
  Filled 2020-05-26: qty 2

## 2020-05-26 NOTE — ED Triage Notes (Signed)
From home, wife called 911 for weakness and confusion. Pt has fever and has a cough. Pt is alert and oriented intermittently. Temp 103.2. Oxygen on room air per EMS was 88%. Pt has also has a cough.
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
## 2020-05-27 ENCOUNTER — Inpatient Hospital Stay (HOSPITAL_COMMUNITY): Payer: Medicare HMO

## 2020-05-27 DIAGNOSIS — I480 Paroxysmal atrial fibrillation: Secondary | ICD-10-CM

## 2020-05-27 DIAGNOSIS — J9601 Acute respiratory failure with hypoxia: Secondary | ICD-10-CM

## 2020-05-27 DIAGNOSIS — I10 Essential (primary) hypertension: Secondary | ICD-10-CM

## 2020-05-27 DIAGNOSIS — N401 Enlarged prostate with lower urinary tract symptoms: Secondary | ICD-10-CM | POA: Diagnosis not present

## 2020-05-27 DIAGNOSIS — R1310 Dysphagia, unspecified: Secondary | ICD-10-CM

## 2020-05-27 DIAGNOSIS — N138 Other obstructive and reflux uropathy: Secondary | ICD-10-CM

## 2020-05-27 DIAGNOSIS — J441 Chronic obstructive pulmonary disease with (acute) exacerbation: Secondary | ICD-10-CM

## 2020-05-27 LAB — CBC WITH DIFFERENTIAL/PLATELET
Abs Immature Granulocytes: 0.2 10*3/uL — ABNORMAL HIGH (ref 0.00–0.07)
Basophils Absolute: 0 10*3/uL (ref 0.0–0.1)
Basophils Relative: 0 %
Eosinophils Absolute: 0.3 10*3/uL (ref 0.0–0.5)
Eosinophils Relative: 2 %
HCT: 30.3 % — ABNORMAL LOW (ref 39.0–52.0)
Hemoglobin: 9.6 g/dL — ABNORMAL LOW (ref 13.0–17.0)
Immature Granulocytes: 1 %
Lymphocytes Relative: 5 %
Lymphs Abs: 1 10*3/uL (ref 0.7–4.0)
MCH: 31.2 pg (ref 26.0–34.0)
MCHC: 31.7 g/dL (ref 30.0–36.0)
MCV: 98.4 fL (ref 80.0–100.0)
Monocytes Absolute: 0.6 10*3/uL (ref 0.1–1.0)
Monocytes Relative: 3 %
Neutro Abs: 16.3 10*3/uL — ABNORMAL HIGH (ref 1.7–7.7)
Neutrophils Relative %: 89 %
Platelets: 200 10*3/uL (ref 150–400)
RBC: 3.08 MIL/uL — ABNORMAL LOW (ref 4.22–5.81)
RDW: 15.1 % (ref 11.5–15.5)
WBC: 18.4 10*3/uL — ABNORMAL HIGH (ref 4.0–10.5)
nRBC: 0 % (ref 0.0–0.2)

## 2020-05-27 MED ORDER — DM-GUAIFENESIN ER 30-600 MG PO TB12
1.0000 | ORAL_TABLET | Freq: Two times a day (BID) | ORAL | Status: DC
  Administered 2020-05-27 – 2020-05-28 (×3): 1 via ORAL
  Filled 2020-05-27 (×3): qty 1

## 2020-05-27 MED ORDER — CEFDINIR 300 MG PO CAPS
300.0000 mg | ORAL_CAPSULE | Freq: Two times a day (BID) | ORAL | Status: DC
  Administered 2020-05-27 – 2020-05-28 (×3): 300 mg via ORAL
  Filled 2020-05-27 (×3): qty 1

## 2020-05-27 MED ORDER — IPRATROPIUM-ALBUTEROL 0.5-2.5 (3) MG/3ML IN SOLN
3.0000 mL | Freq: Four times a day (QID) | RESPIRATORY_TRACT | Status: DC
  Administered 2020-05-27 (×2): 3 mL via RESPIRATORY_TRACT
  Filled 2020-05-27 (×2): qty 3

## 2020-05-27 MED ORDER — IPRATROPIUM-ALBUTEROL 0.5-2.5 (3) MG/3ML IN SOLN
3.0000 mL | Freq: Four times a day (QID) | RESPIRATORY_TRACT | Status: DC | PRN
  Administered 2020-05-27 – 2020-05-28 (×2): 3 mL via RESPIRATORY_TRACT
  Filled 2020-05-27 (×2): qty 3

## 2020-05-27 MED ORDER — BUDESONIDE 0.25 MG/2ML IN SUSP
0.5000 mg | Freq: Two times a day (BID) | RESPIRATORY_TRACT | Status: DC
  Administered 2020-05-27 – 2020-05-28 (×3): 0.5 mg via RESPIRATORY_TRACT
  Filled 2020-05-27 (×3): qty 4

## 2020-05-27 NOTE — Progress Notes (Addendum)
SATURATION QUALIFICATIONS: (This note is used to comply with regulatory documentation for home oxygen)  Patient Saturations on Room Air at Rest = 93%  Patient Saturations on Room Air while Ambulating = 94-95%  Patient Saturations on 2 lpm [place] oxygen at rest: 98%  Patient Saturations on 2 Liters of oxygen while Ambulating = 98%  Please briefly explain why patient needs home oxygen: Pt on home O2 for use at night per pt and his wife since 2018 due to COPD  Pt ambulated total of 535ft in hallway with standby assist. Tolerated well, gait steady, denies c/o.  Pt's wife here now, states that pt has diagnosed dysphagia since 2018 CABG. Has been told to sit fully upright and turn head to right when attempting to swallow.
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
## 2020-05-27 NOTE — Plan of Care (Signed)

## 2020-05-27 NOTE — Progress Notes (Signed)
Pt has progressed well today. Moves without difficulty from chair to bed, steady gait, no assistance needed. Tolerating thickened liquids, but states doesn't prefer them. Has episodes of hiccups off and on today, using one teaspoon of peanut butter to stop the hiccups and it has been effective with no swallowing issues noted. Remains on room air with no resp difficulty or SOB, SaO2 remains 93% or greater.

## 2020-05-27 NOTE — Evaluation (Signed)
  Modified Barium Swallow Progress Note  Patient Details  Name: Devin Barton MRN: [id number] Date of Birth: 1946/01/21  Today's Date: 05/27/2020  Modified Barium Swallow completed.  Full report located under Chart Review in the Imaging Section.  Brief recommendations include the following:  Clinical Impression  Pt presents with moderate oropharyngeal dsyphagia. Presentation today is very similar to most recent MBS completed on 02/2017 and therefore this report will echo many of those recommendations and observations. Pt with suspected baseline deficits including Left sided weakness of facial nerve and suspected increased weakness due to acute illness. Oral prep phase is prolonged followed by a timely swallow with moderate residuals initially just in the lower pyriforms but with sequential bites/sips residue fills up to the level of the epiglottis. Further note reduced epiglottic deflection, reduced posterior pharyngeal wall contraction, and reduced relaxation of the UES resulting in significant pooling in the pyriforms and trace penetration during/after the swallow with thins. Multiple dry swallows are minimally effective in clearing residue. Residue of thin liquids does spill into the airway post swallow but was only visualized falling below the cords after the swallow X1; further note, despite reflexive coughing aspirates were not cleared from the airway. Head turn to the right did seem to minimally facilitate clearance of pharyngeal residue. Given acute decompensation recommend D3/soft diet with Nectar thick liquids to reduce risk of aspiration as pt recovers. Improved cough strength will be beneficial to transition back to thin eventually. ST will continue to follow acutely and recommend HH or OP ST upon d/c to continue to reinforce strategies and hopefully subjectively upgrade to thin when appropriate.   Swallow Evaluation Recommendations       SLP Diet Recommendations: Dysphagia 3 (Mech soft)  solids;Nectar thick liquid   Liquid Administration via: Cup   Medication Administration: Whole meds with puree       Compensations: Follow solids with liquid;Multiple dry swallows after each bite/sip (head turn to the Right)   Postural Changes: Remain semi-upright after after feeds/meals (Comment);Seated upright at 90 degrees   Oral Care Recommendations: Oral care BID   Other Recommendations: Order thickener from pharmacy   [name] H. Romie Levee, CCC-SLP Speech Language Pathologist  [name] 05/27/2020,2:11 PM

## 2020-05-27 NOTE — Progress Notes (Signed)
Pt noted to have moderate amount of coughing with eating and drinking this am with breakfast. Lots of throat clearing also. When questioned, pt admitted that he has been having trouble, "getting strangled a little" when he eats and drinks for past few months. MD [facility] notified.

## 2020-05-27 NOTE — Progress Notes (Signed)
PROGRESS NOTE    JOZEPH Barton  GGE:[id number] DOB: [birth year] DOA: 05/26/2020 PCP: Assunta Found, MD  Chief Complaint  Patient presents with  . Altered Mental Status    Brief admission narrative:  As per H&P written by Dr. Debby Bud on 05/26/2020 Devin Barton is a 75 y.o. male with medical history significant of CAD s/p CABG, Chronic combined heart failure, COPD GOLD III on nocturnal oxygen who developed  increased SOB and fever. Of note he was hospitalized with Covid 19 pneumonia December 27th, 2021. Due to his symptoms he presents to [facility] for evaluation  ED Course: T 103, 122/64  HR 98  RR 23, at presentation O2 sats in the 80's, which came up with continue O2 and steroid treatment. Lab revealed leukocytosis to 16k with 76/4/12, Hgb 11.0, BNP 161, lactic acid 1.1. CXR with NAD. He was administered IV solumedrol 125mg  and Levquin 500 mg. TRH called to admit to continue treatment for COPD exacerbation with hypoxemic respiratory failure.    Assessment & Plan: 1-acute respiratory hypoxia in the setting of COPD exacerbation of bronchiectasis -Continue nebulizer management -Continue steroids and antibiotics (last one transitioned to oral route using cefdinir). -Start Mucinex and flutter valve -Assess oxygen desaturation screening -Follow clinical response.  2-dysphagia -Diet modified to dysphagia 3 and speech therapy evaluation requested. -Follow MBS results and recommendations.  3-chronic combined systolic and diastolic heart failure -Stable and compensated currently -Last ejection fraction 25 to 30% -Follow daily weights and low-sodium diet. -Continue the use of Lasix and Entresto  4-HYPERTENSION, BENIGN -Stable and well-controlled -Continue current antihypertensive regimen.  5-history of paroxysmal atrial fibrillation -No chronically on anticoagulation -Continue aspirin and carvedilol.  6-gastroesophageal disease -Continue PPI.  7-history of BPH -No complaints of  urinary retention -Continue the use of Flomax   DVT prophylaxis: Lovenox Code Status: Full code Family Communication: No family at bedside.  Patient expressed feeling comfortable updating his family members by himself. Disposition:   Status is: Inpatient  Dispo: The patient is from: Home              Anticipated d/c is 09-24-1970 in 1-2 days.              Patient currently no medically stable for discharge; patient experiencing short winded sensation, intermittent coughing spells and difficulty swallowing.  2 L nasal cannula supplementation in place at this time still with ongoing bronchitis and expiratory wheezing on examination.  Improving.   Difficult to place patient no    Consultants:   None   Procedures:  See below for x-ray reports   Antimicrobials:  Received Levaquin on admission -Cefdinir   Subjective: 2 L nasal cannula supplementation in place.  Afebrile, no chest pain, no nausea, no vomiting.  Patient reports some difficulties with swallowing with complaints of feeling strangled at times.  Objective: Vitals:   05/27/20 0850 05/27/20 0903 05/27/20 1041 05/27/20 1350  BP:   (!) 121/59 (!) 107/57  Pulse: 62  68 73  Resp:   18 16  Temp: 98.6 F (37 C)  97.9 F (36.6 C) 97.8 F (36.6 C)  TempSrc: Oral  Oral Oral  SpO2:  99% 93% 98%  Weight:        Intake/Output Summary (Last 24 hours) at 05/27/2020 1406 Last data filed at 05/27/2020 1230 Gross per 24 hour  Intake 2113 ml  Output 800 ml  Net 1313 ml   Filed Weights   05/26/20 1906 05/27/20 0500 05/27/20 0636  Weight: 55.3 kg 63.3 kg  63.1 kg    Examination:  General exam: Appears calm and comfortable; not having difficulty speaking in full sentences; feeling short winded with activity, and complaining of intermittent coughing spells.  During my evaluation 2 L nasal cannula supplementation in place.  Patient reports some difficulty swallowing. Respiratory system: Positive rhonchi, right more than left;  expiratory wheezing appreciated.  No using accessory muscles. Cardiovascular system: Rate controlled, no rubs, no gallops, no JVD. Gastrointestinal system: Abdomen is nondistended, soft and nontender. No organomegaly or masses felt. Normal bowel sounds heard. Central nervous system: Alert and oriented. No focal neurological deficits. Extremities: No cyanosis or clubbing. Skin: No petechiae. Psychiatry: Judgement and insight appear normal. Mood & affect appropriate.     Data Reviewed: I have personally reviewed following labs and imaging studies  CBC: Recent Labs  Lab 05/26/20 1915 05/27/20 0533  WBC 16.0* 18.4*  NEUTROABS 12.1* 16.3*  HGB 11.0* 9.6*  HCT 34.1* 30.3*  MCV 98.0 98.4  PLT 219 200    Basic Metabolic Panel: Recent Labs  Lab 05/26/20 1915  NA 134*  K 3.6  CL 99  CO2 27  GLUCOSE 114*  BUN 12  CREATININE 0.85  CALCIUM 8.3*    GFR: Estimated Creatinine Clearance: 68 mL/min (by C-G formula based on SCr of 0.85 mg/dL).  Liver Function Tests: Recent Labs  Lab 05/26/20 1915  AST 14*  ALT 12  ALKPHOS 52  BILITOT 0.8  PROT 6.3*  ALBUMIN 3.3*    CBG: No results for input(s): GLUCAP in the last 168 hours.   Recent Results (from the past 240 hour(s))  Resp Panel by RT-PCR (Flu A&B, Covid) Nasopharyngeal Swab     Status: None   Collection Time: 05/26/20  7:56 PM   Specimen: Nasopharyngeal Swab; Nasopharyngeal(NP) swabs in vial transport medium  Result Value Ref Range Status   SARS Coronavirus 2 by RT PCR NEGATIVE NEGATIVE Final    Comment: (NOTE) SARS-CoV-2 target nucleic acids are NOT DETECTED.  The SARS-CoV-2 RNA is generally detectable in upper respiratory specimens during the acute phase of infection. The lowest concentration of SARS-CoV-2 viral copies this assay can detect is 138 copies/mL. A negative result does not preclude SARS-Cov-2 infection and should not be used as the sole basis for treatment or other patient management decisions. A  negative result may occur with  improper specimen collection/handling, submission of specimen other than nasopharyngeal swab, presence of viral mutation(s) within the areas targeted by this assay, and inadequate number of viral copies(<138 copies/mL). A negative result must be combined with clinical observations, patient history, and epidemiological information. The expected result is Negative.  Fact Sheet for Patients:  BloggerCourse.com  Fact Sheet for Healthcare Providers:  SeriousBroker.it  This test is no t yet approved or cleared by the [place] FDA and  has been authorized for detection and/or diagnosis of SARS-CoV-2 by FDA under an Emergency Use Authorization (EUA). This EUA will remain  in effect (meaning this test can be used) for the duration of the COVID-19 declaration under Section 564(b)(1) of the Act, 21 U.S.C.section 360bbb-3(b)(1), unless the authorization is terminated  or revoked sooner.       Influenza A by PCR NEGATIVE NEGATIVE Final   Influenza B by PCR NEGATIVE NEGATIVE Final    Comment: (NOTE) The Xpert Xpress SARS-CoV-2/FLU/RSV plus assay is intended as an aid in the diagnosis of influenza from Nasopharyngeal swab specimens and should not be used as a sole basis for treatment. Nasal washings and aspirates are unacceptable for  Xpert Xpress SARS-CoV-2/FLU/RSV testing.  Fact Sheet for Patients: BloggerCourse.com  Fact Sheet for Healthcare Providers: SeriousBroker.it  This test is not yet approved or cleared by the [place] FDA and has been authorized for detection and/or diagnosis of SARS-CoV-2 by FDA under an Emergency Use Authorization (EUA). This EUA will remain in effect (meaning this test can be used) for the duration of the COVID-19 declaration under Section 564(b)(1) of the Act, 21 U.S.C. section 360bbb-3(b)(1), unless the authorization  is terminated or revoked.  Performed at [facility], [address], [place], [place] [address]      Radiology Studies: [facility] 1 View  Result Date: 05/26/2020 CLINICAL DATA:  Weakness confusion EXAM: PORTABLE CHEST 1 VIEW COMPARISON:  03/21/2020 FINDINGS: Post sternotomy changes. No focal airspace disease or effusion. Stable cardiomediastinal silhouette with aortic atherosclerosis. No pneumothorax. IMPRESSION: No active disease. Electronically Signed   By: Jasmine Pang M.D.   On: 05/26/2020 19:53    Scheduled Meds: . aspirin EC  81 mg Oral Q breakfast  . atorvastatin  40 mg Oral q1800  . budesonide  0.5 mg Nebulization BID  . carvedilol  3.125 mg Oral BID WC  . cefdinir  300 mg Oral Q12H  . dextromethorphan-guaiFENesin  1 tablet Oral BID  . enoxaparin (LOVENOX) injection  40 mg Subcutaneous Q24H  . finasteride  5 mg Oral Daily  . furosemide  40 mg Oral Daily  . influenza vaccine adjuvanted  0.5 mL Intramuscular Tomorrow-1000  . ipratropium-albuterol  3 mL Nebulization QID  . methylPREDNISolone (SOLU-MEDROL) injection  60 mg Intravenous Q8H   Followed by  . [START ON 05/29/2020] predniSONE  40 mg Oral Q breakfast  . pantoprazole  40 mg Oral Daily  . potassium chloride SA  20 mEq Oral Daily  . sacubitril-valsartan  1 tablet Oral BID  . sodium chloride flush  3 mL Intravenous Q12H  . tamsulosin  0.4 mg Oral QHS   Continuous Infusions: . sodium chloride       LOS: 1 day    Time spent: 30 minutes    Vassie Loll, MD [facility]   To contact the attending provider between 7A-7P or the covering provider during after hours 7P-7A, please log into the web site [email or web address] and access using universal [place] password for that web site. If you do not have the password, please call the hospital operator.  05/27/2020, 2:06 PM

## 2020-05-28 DIAGNOSIS — I10 Essential (primary) hypertension: Secondary | ICD-10-CM | POA: Diagnosis not present

## 2020-05-28 DIAGNOSIS — N401 Enlarged prostate with lower urinary tract symptoms: Secondary | ICD-10-CM | POA: Diagnosis not present

## 2020-05-28 DIAGNOSIS — J441 Chronic obstructive pulmonary disease with (acute) exacerbation: Secondary | ICD-10-CM | POA: Diagnosis not present

## 2020-05-28 DIAGNOSIS — J9601 Acute respiratory failure with hypoxia: Secondary | ICD-10-CM | POA: Diagnosis not present

## 2020-05-28 DIAGNOSIS — Z23 Encounter for immunization: Secondary | ICD-10-CM | POA: Diagnosis not present

## 2020-05-28 MED ORDER — CEFDINIR 300 MG PO CAPS: 300.0000 mg | ORAL_CAPSULE | Freq: Two times a day (BID) | ORAL | 0 refills | Status: AC

## 2020-05-28 MED ORDER — FOOD THICKENER (THICKENUP CLEAR): ORAL | 2 refills | Status: DC

## 2020-05-28 MED ORDER — PREDNISONE 20 MG PO TABS: ORAL_TABLET | ORAL | 0 refills | Status: DC

## 2020-05-28 NOTE — Discharge Summary (Signed)
Physician Discharge Summary  Devin Barton CBS:[id number] DOB: 04-15-1945 DOA: 05/26/2020  PCP: Devin Found, MD  Admit date: 05/26/2020 Discharge date: 05/28/2020  Time spent: 35 minutes  Recommendations for Outpatient Follow-up:  1. Repeat chest x-ray in 6 weeks to assure complete resolution of infiltrates 2. Repeat basic metabolic panel electrolytes and renal function 3. Reassess the need for oxygen supplementation.   Discharge Diagnoses:  Active Problems:   HYPERTENSION, BENIGN   COPD exacerbation (HCC)   Acute respiratory failure (HCC)   PAF (paroxysmal atrial fibrillation) (HCC)   Benign prostatic hyperplasia with urinary obstruction   Discharge Condition: Stable and improved.  Discharged home with instructions to follow-up with PCP in 10 days.  CODE STATUS: Full code  Diet recommendation: Heart healthy/low-sodium.  Filed Weights   05/27/20 0636 05/28/20 0500 05/28/20 0520  Weight: 63.1 kg 63.1 kg 62.1 kg    History of present illness:  As per H&P written by Dr. Debby Barton on 05/26/2020 Devin Barton a 74 y.o.malewith medical history significant ofCAD s/p CABG, Chronic combined heart failure, COPD GOLD III on nocturnal oxygen who developed increased SOB and fever. Of notehe was hospitalized with Covid 19 pneumonia December 27th, 2021. Due to his symptoms he presents to [facility] for evaluation  ED Course:T 103, 122/64 HR 98 RR 23, at presentation O2 sats in the 80's, which came up with continue O2 and steroid treatment. Lab revealed leukocytosis to 16k with 76/4/12, Hgb 11.0, BNP 161, lactic acid 1.1. CXR with NAD. He was administered IV solumedrol 125mg  and Levquin 500 mg. [facility] called to admit to continue treatment for COPD exacerbation with hypoxemic respiratory failure.   Hospital Course:  1-acute respiratory hypoxia in the setting of COPD exacerbation of bronchiectasis -Continue nebulizer and bronchodilators management -Continue steroids and antibiotics (tapering  and 4 more days of cefdinir at discharge). -Continue Mucinex and flutter valve -no oxygen supplementation needed at discharge. -Follow up with PCP in 10 days recommended.  2-dysphagia -Diet modified to dysphagia 3 and nectar thick liquids. -Appreciate assistance and recommendations by speech therapy.  3-chronic combined systolic and diastolic heart failure -Stable and compensated currently -Last ejection fraction 25 to 30% -Follow daily weights and low-sodium diet. -Continue the use of Lasix and Entresto  4-HYPERTENSION, BENIGN -Stable and well-controlled -Continue current antihypertensive regimen.  5-history of paroxysmal atrial fibrillation -No chronically on anticoagulation -Continue aspirin and carvedilol.  6-gastroesophageal disease -Continue PPI.  7-history of BPH -No complaints of urinary retention -Continue the use of Flomax  Procedures:  See below for x-ray reports.  Consultations:  None   Discharge Exam: Vitals:   05/28/20 0745 05/28/20 1346  BP:  (!) 159/82  Pulse:  85  Resp:  18  Temp:  97.9 F (36.6 C)  SpO2: 95% 98%    General: Afebrile: No chest pain, no nausea, no vomiting.  Speaking in full sentences and not requiring O2 supplementation. Cardiovascular: Rate controlled, no rubs, no gallops,no JVD on exam Respiratory: Improved air movement bilaterally; very mild expiratory wheezing at discharge.  No using accessory muscle.  Positive rhonchi. Abdomen: soft, NT, ND, positive BS Extremities: no cyanosis, no clubbing.  Discharge Instructions   Discharge Instructions    (HEART FAILURE PATIENTS) Call MD:  Anytime you have any of the following symptoms: 1) 3 pound weight gain in 24 hours or 5 pounds in 1 week 2) shortness of breath, with or without a dry hacking cough 3) swelling in the hands, feet or stomach 4) if you have to sleep on  extra pillows at night in order to breathe.   Complete by: As directed    Diet - low sodium heart healthy    Complete by: As directed    Discharge instructions   Complete by: As directed    Follow dysphagia 3 diet with nectar thick liquids. Take medications as prescribed Arrange follow-up with PCP in 10 days Maintain adequate hydration Follow low-sodium diet (less than 2 g daily) Check your weight on daily basis.   Increase activity slowly   Complete by: As directed      Allergies as of 05/28/2020   No Known Allergies     Medication List    TAKE these medications   albuterol 108 (90 Base) MCG/ACT inhaler Commonly known as: VENTOLIN HFA Inhale 2 puffs into the lungs every 6 (six) hours as needed. What changed: reasons to take this   aspirin EC 81 MG tablet Take 1 tablet (81 mg total) by mouth daily with breakfast.   atorvastatin 40 MG tablet Commonly known as: LIPITOR TAKE 1 TABLET BY MOUTH ONCE DAILY AT 6PM What changed: See the new instructions.   budesonide 0.25 MG/2ML nebulizer solution Commonly known as: PULMICORT Take 2 mLs (0.25 mg total) by nebulization 2 (two) times daily.   carvedilol 3.125 MG tablet Commonly known as: COREG Take 1 tablet by mouth twice daily What changed: when to take this   cefdinir 300 MG capsule Commonly known as: OMNICEF Take 1 capsule (300 mg total) by mouth every 12 (twelve) hours for 4 days.   COQ10 PO Take 1 tablet by mouth daily.   Entresto 24-26 MG Generic drug: sacubitril-valsartan TAKE 1 TABLET BY MOUTH TWICE DAILY . APPOINTMENT REQUIRED FOR FUTURE REFILLS What changed: See the new instructions.   finasteride 5 MG tablet Commonly known as: PROSCAR Take 5 mg by mouth daily.   food thickener Powd Commonly known as: RESOURCE THICKENUP CLEAR Use as needed can help liquids until the nectar thick consistency achieved.   furosemide 40 MG tablet Commonly known as: LASIX TAKE 1 TABLET BY MOUTH ONCE DAILY. Devin Barton TAKE ADDITIONAL TABLET FOR WEIGHT GAIN OR SWELLING What changed: See the new instructions.   guaiFENesin 600 MG 12 hr  tablet Commonly known as: MUCINEX Take 1,200 mg by mouth 2 (two) times daily as needed for to loosen phlegm.   ipratropium 0.02 % nebulizer solution Commonly known as: ATROVENT Take 2.5 mLs (0.5 mg total) by nebulization 2 (two) times daily.   pantoprazole 40 MG tablet Commonly known as: Protonix Take 1 tablet (40 mg total) by mouth daily. What changed:   when to take this  reasons to take this   polyethylene glycol 17 g packet Commonly known as: MIRALAX / GLYCOLAX Take 17 g by mouth daily as needed for mild constipation or moderate constipation.   potassium chloride SA 20 MEQ tablet Commonly known as: KLOR-CON Take 1 tablet by mouth once daily   predniSONE 20 MG tablet Commonly known as: DELTASONE Take 3 tablets by mouth daily x1 day; then 2 tablets by mouth daily x2 days; then 1 tablet by mouth daily x3 days; then half tablet by mouth daily x3 days and stop prednisone. What changed:   how much to take  how to take this  when to take this  additional instructions   sucralfate 1 GM/10ML suspension Commonly known as: CARAFATE Take 1 g by mouth 3 (three) times daily as needed.   tamsulosin 0.4 MG Caps capsule Commonly known as: FLOMAX Take 1 capsule  by mouth at bedtime   VITAMIN D3 PO Take 1 tablet by mouth daily.   ZINC PO Take 1 tablet by mouth daily.      No Known Allergies  Follow-up Information    Devin FoundGolding, John, MD. Schedule an appointment as soon as possible for a visit in 10 day(s).   Specialty: Family Medicine Contact information: [address] [place] [address] [phone]        Antoine PocheBranch, Jonathan F, MD .   Specialty: Cardiology Contact information: [address] [place] [address] [phone]               The results of significant diagnostics from this hospitalization (including imaging, microbiology, ancillary and laboratory) are listed below for reference.    Significant Diagnostic Studies: DG  Chest Port 1 View  Result Date: 05/26/2020 CLINICAL DATA:  Weakness confusion EXAM: PORTABLE CHEST 1 VIEW COMPARISON:  03/21/2020 FINDINGS: Post sternotomy changes. No focal airspace disease or effusion. Stable cardiomediastinal silhouette with aortic atherosclerosis. No pneumothorax. IMPRESSION: No active disease. Electronically Signed   By: Jasmine PangKim  Fujinaga M.D.   On: 05/26/2020 19:53   Microbiology: Recent Results (from the past 240 hour(s))  Culture, blood (Routine x 2)     Status: None (Preliminary result)   Collection Time: 05/26/20  7:15 PM   Specimen: BLOOD LEFT FOREARM  Result Value Ref Range Status   Specimen Description BLOOD LEFT FOREARM  Final   Special Requests   Final    BOTTLES DRAWN AEROBIC AND ANAEROBIC Blood Culture adequate volume   Culture   Final    NO GROWTH 2 DAYS Performed at [facility], [address] [place], [place] [address]    Report Status PENDING  Incomplete  Resp Panel by RT-PCR (Flu A&B, Covid) Nasopharyngeal Swab     Status: None   Collection Time: 05/26/20  7:56 PM   Specimen: Nasopharyngeal Swab; Nasopharyngeal(NP) swabs in vial transport medium  Result Value Ref Range Status   SARS Coronavirus 2 by RT PCR NEGATIVE NEGATIVE Final    Comment: (NOTE) SARS-CoV-2 target nucleic acids are NOT DETECTED.  The SARS-CoV-2 RNA is generally detectable in upper respiratory specimens during the acute phase of infection. The lowest concentration of SARS-CoV-2 viral copies this assay can detect is 138 copies/mL. A negative result does not preclude SARS-Cov-2 infection and should not be used as the sole basis for treatment or other patient management decisions. A negative result Millena Callins occur with  improper specimen collection/handling, submission of specimen other than nasopharyngeal swab, presence of viral mutation(s) within the areas targeted by this assay, and inadequate number of viral copies(<138 copies/mL). A negative result must be combined with clinical  observations, patient history, and epidemiological information. The expected result is Negative.  Fact Sheet for Patients:  BloggerCourse.comhttps://www.fda.gov/media/152166/download  Fact Sheet for Healthcare Providers:  SeriousBroker.ithttps://www.fda.gov/media/152162/download  This test is no t yet approved or cleared by the [place] FDA and  has been authorized for detection and/or diagnosis of SARS-CoV-2 by FDA under an Emergency Use Authorization (EUA). This EUA will remain  in effect (meaning this test can be used) for the duration of the COVID-19 declaration under Section 564(b)(1) of the Act, 21 U.S.C.section 360bbb-3(b)(1), unless the authorization is terminated  or revoked sooner.       Influenza A by PCR NEGATIVE NEGATIVE Final   Influenza B by PCR NEGATIVE NEGATIVE Final    Comment: (NOTE) The Xpert Xpress SARS-CoV-2/FLU/RSV plus assay is intended as an aid in the  diagnosis of influenza from Nasopharyngeal swab specimens and should not be used as a sole basis for treatment. Nasal washings and aspirates are unacceptable for Xpert Xpress SARS-CoV-2/FLU/RSV testing.  Fact Sheet for Patients: BloggerCourse.com  Fact Sheet for Healthcare Providers: SeriousBroker.it  This test is not yet approved or cleared by the [place] FDA and has been authorized for detection and/or diagnosis of SARS-CoV-2 by FDA under an Emergency Use Authorization (EUA). This EUA will remain in effect (meaning this test can be used) for the duration of the COVID-19 declaration under Section 564(b)(1) of the Act, 21 U.S.C. section 360bbb-3(b)(1), unless the authorization is terminated or revoked.  Performed at [facility], [address], [place], [place] [address]   Culture, blood (Routine x 2)     Status: None (Preliminary result)   Collection Time: 05/26/20  8:13 PM   Specimen: BLOOD LEFT HAND  Result Value Ref Range Status   Specimen Description BLOOD LEFT  HAND  Final   Special Requests   Final    BOTTLES DRAWN AEROBIC AND ANAEROBIC Blood Culture adequate volume   Culture   Final    NO GROWTH 2 DAYS Performed at [facility], [address] [place], [place] [address]    Report Status PENDING  Incomplete     Labs: Basic Metabolic Panel: Recent Labs  Lab 05/26/20 1915  NA 134*  K 3.6  CL 99  CO2 27  GLUCOSE 114*  BUN 12  CREATININE 0.85  CALCIUM 8.3*   Liver Function Tests: Recent Labs  Lab 05/26/20 1915  AST 14*  ALT 12  ALKPHOS 52  BILITOT 0.8  PROT 6.3*  ALBUMIN 3.3*   CBC: Recent Labs  Lab 05/26/20 1915 05/27/20 0533  WBC 16.0* 18.4*  NEUTROABS 12.1* 16.3*  HGB 11.0* 9.6*  HCT 34.1* 30.3*  MCV 98.0 98.4  PLT 219 200   BNP (last 3 results) Recent Labs    05/26/20 1915  BNP 161.0*    Signed:  Vassie Loll MD.  [facility] 05/28/2020, 2:54 PM

## 2020-05-28 NOTE — Progress Notes (Signed)
Nsg Discharge Note  Admit Date:  05/26/2020 Discharge date: 05/28/2020   OREN BARELLA to be D/C'd Home per MD order.  AVS completed.  Patient/caregiver able to verbalize understanding.  Discharge Medication: Allergies as of 05/28/2020   No Known Allergies     Medication List    TAKE these medications   albuterol 108 (90 Base) MCG/ACT inhaler Commonly known as: VENTOLIN HFA Inhale 2 puffs into the lungs every 6 (six) hours as needed. What changed: reasons to take this   aspirin EC 81 MG tablet Take 1 tablet (81 mg total) by mouth daily with breakfast.   atorvastatin 40 MG tablet Commonly known as: LIPITOR TAKE 1 TABLET BY MOUTH ONCE DAILY AT 6PM What changed: See the new instructions.   budesonide 0.25 MG/2ML nebulizer solution Commonly known as: PULMICORT Take 2 mLs (0.25 mg total) by nebulization 2 (two) times daily.   carvedilol 3.125 MG tablet Commonly known as: COREG Take 1 tablet by mouth twice daily What changed: when to take this   cefdinir 300 MG capsule Commonly known as: OMNICEF Take 1 capsule (300 mg total) by mouth every 12 (twelve) hours for 4 days.   COQ10 PO Take 1 tablet by mouth daily.   Entresto 24-26 MG Generic drug: sacubitril-valsartan TAKE 1 TABLET BY MOUTH TWICE DAILY . APPOINTMENT REQUIRED FOR FUTURE REFILLS What changed: See the new instructions.   finasteride 5 MG tablet Commonly known as: PROSCAR Take 5 mg by mouth daily.   food thickener Powd Commonly known as: RESOURCE THICKENUP CLEAR Use as needed can help liquids until the nectar thick consistency achieved.   furosemide 40 MG tablet Commonly known as: LASIX TAKE 1 TABLET BY MOUTH ONCE DAILY. MAY TAKE ADDITIONAL TABLET FOR WEIGHT GAIN OR SWELLING What changed: See the new instructions.   guaiFENesin 600 MG 12 hr tablet Commonly known as: MUCINEX Take 1,200 mg by mouth 2 (two) times daily as needed for to loosen phlegm.   ipratropium 0.02 % nebulizer solution Commonly known  as: ATROVENT Take 2.5 mLs (0.5 mg total) by nebulization 2 (two) times daily.   pantoprazole 40 MG tablet Commonly known as: Protonix Take 1 tablet (40 mg total) by mouth daily. What changed:   when to take this  reasons to take this   polyethylene glycol 17 g packet Commonly known as: MIRALAX / GLYCOLAX Take 17 g by mouth daily as needed for mild constipation or moderate constipation.   potassium chloride SA 20 MEQ tablet Commonly known as: KLOR-CON Take 1 tablet by mouth once daily   predniSONE 20 MG tablet Commonly known as: DELTASONE Take 3 tablets by mouth daily x1 day; then 2 tablets by mouth daily x2 days; then 1 tablet by mouth daily x3 days; then half tablet by mouth daily x3 days and stop prednisone. What changed:   how much to take  how to take this  when to take this  additional instructions   sucralfate 1 GM/10ML suspension Commonly known as: CARAFATE Take 1 g by mouth 3 (three) times daily as needed.   tamsulosin 0.4 MG Caps capsule Commonly known as: FLOMAX Take 1 capsule by mouth at bedtime   VITAMIN D3 PO Take 1 tablet by mouth daily.   ZINC PO Take 1 tablet by mouth daily.       Discharge Assessment: Vitals:   05/28/20 0745 05/28/20 1346  BP:  (!) 159/82  Pulse:  85  Resp:  18  Temp:  97.9 F (36.6 C)  SpO2: 95% 98%   Skin clean, dry and intact without evidence of skin break down, no evidence of skin tears noted. IV catheter discontinued intact. Site without signs and symptoms of complications - no redness or edema noted at insertion site, patient denies c/o pain - only slight tenderness at site.  Dressing with slight pressure applied.  D/c Instructions-Education: Discharge instructions given to patient's wife Kasson Lamere with verbalized understanding. D/c education completed with patient/family including follow up instructions, medication list, d/c activities limitations if indicated, with other d/c instructions as indicated by MD -  patient able to verbalize understanding, all questions fully answered. Patient instructed to return to ED, call 911, or call MD for any changes in condition.  Patient escorted via WC, and D/C home via private auto.  Jethro Poling, RN 05/28/2020 3:32 PM

## 2020-05-31 LAB — CULTURE, BLOOD (ROUTINE X 2)
Culture: NO GROWTH
Culture: NO GROWTH
Special Requests: ADEQUATE
Special Requests: ADEQUATE

## 2020-06-07 DIAGNOSIS — Z6821 Body mass index (BMI) 21.0-21.9, adult: Secondary | ICD-10-CM | POA: Diagnosis not present

## 2020-06-07 DIAGNOSIS — I5043 Acute on chronic combined systolic (congestive) and diastolic (congestive) heart failure: Secondary | ICD-10-CM | POA: Diagnosis not present

## 2020-06-07 DIAGNOSIS — R1310 Dysphagia, unspecified: Secondary | ICD-10-CM | POA: Diagnosis not present

## 2020-06-07 DIAGNOSIS — Z1331 Encounter for screening for depression: Secondary | ICD-10-CM | POA: Diagnosis not present

## 2020-06-07 DIAGNOSIS — I2510 Atherosclerotic heart disease of native coronary artery without angina pectoris: Secondary | ICD-10-CM | POA: Diagnosis not present

## 2020-06-07 DIAGNOSIS — I509 Heart failure, unspecified: Secondary | ICD-10-CM | POA: Diagnosis not present

## 2020-06-07 DIAGNOSIS — J449 Chronic obstructive pulmonary disease, unspecified: Secondary | ICD-10-CM | POA: Diagnosis not present

## 2020-06-12 DIAGNOSIS — J449 Chronic obstructive pulmonary disease, unspecified: Secondary | ICD-10-CM | POA: Diagnosis not present

## 2020-06-12 DIAGNOSIS — I5021 Acute systolic (congestive) heart failure: Secondary | ICD-10-CM | POA: Diagnosis not present

## 2020-06-20 ENCOUNTER — Other Ambulatory Visit: Payer: Self-pay | Admitting: Urology

## 2020-06-22 DIAGNOSIS — I2510 Atherosclerotic heart disease of native coronary artery without angina pectoris: Secondary | ICD-10-CM | POA: Diagnosis not present

## 2020-06-22 DIAGNOSIS — I509 Heart failure, unspecified: Secondary | ICD-10-CM | POA: Diagnosis not present

## 2020-06-22 DIAGNOSIS — J449 Chronic obstructive pulmonary disease, unspecified: Secondary | ICD-10-CM | POA: Diagnosis not present

## 2020-06-27 ENCOUNTER — Other Ambulatory Visit: Payer: Self-pay

## 2020-06-27 ENCOUNTER — Other Ambulatory Visit: Payer: Self-pay | Admitting: Cardiology

## 2020-06-27 DIAGNOSIS — N138 Other obstructive and reflux uropathy: Secondary | ICD-10-CM

## 2020-06-27 DIAGNOSIS — N401 Enlarged prostate with lower urinary tract symptoms: Secondary | ICD-10-CM

## 2020-06-27 MED ORDER — TAMSULOSIN HCL 0.4 MG PO CAPS: 0.4000 mg | ORAL_CAPSULE | Freq: Every day | ORAL | 0 refills | Status: DC

## 2020-06-28 DIAGNOSIS — R3912 Poor urinary stream: Secondary | ICD-10-CM | POA: Diagnosis not present

## 2020-06-28 DIAGNOSIS — R339 Retention of urine, unspecified: Secondary | ICD-10-CM | POA: Diagnosis not present

## 2020-07-13 DIAGNOSIS — J449 Chronic obstructive pulmonary disease, unspecified: Secondary | ICD-10-CM | POA: Diagnosis not present

## 2020-07-13 DIAGNOSIS — I5021 Acute systolic (congestive) heart failure: Secondary | ICD-10-CM | POA: Diagnosis not present

## 2020-07-23 DIAGNOSIS — I509 Heart failure, unspecified: Secondary | ICD-10-CM | POA: Diagnosis not present

## 2020-07-23 DIAGNOSIS — J449 Chronic obstructive pulmonary disease, unspecified: Secondary | ICD-10-CM | POA: Diagnosis not present

## 2020-07-23 DIAGNOSIS — I2510 Atherosclerotic heart disease of native coronary artery without angina pectoris: Secondary | ICD-10-CM | POA: Diagnosis not present

## 2020-07-26 DIAGNOSIS — R3912 Poor urinary stream: Secondary | ICD-10-CM | POA: Diagnosis not present

## 2020-07-26 DIAGNOSIS — R339 Retention of urine, unspecified: Secondary | ICD-10-CM | POA: Diagnosis not present

## 2020-08-12 DIAGNOSIS — I5021 Acute systolic (congestive) heart failure: Secondary | ICD-10-CM | POA: Diagnosis not present

## 2020-08-12 DIAGNOSIS — J449 Chronic obstructive pulmonary disease, unspecified: Secondary | ICD-10-CM | POA: Diagnosis not present

## 2020-08-30 DIAGNOSIS — R3912 Poor urinary stream: Secondary | ICD-10-CM | POA: Diagnosis not present

## 2020-08-30 DIAGNOSIS — R339 Retention of urine, unspecified: Secondary | ICD-10-CM | POA: Diagnosis not present

## 2020-09-12 DIAGNOSIS — J449 Chronic obstructive pulmonary disease, unspecified: Secondary | ICD-10-CM | POA: Diagnosis not present

## 2020-09-12 DIAGNOSIS — I5021 Acute systolic (congestive) heart failure: Secondary | ICD-10-CM | POA: Diagnosis not present

## 2020-09-21 ENCOUNTER — Ambulatory Visit: Payer: Medicare HMO | Admitting: Cardiology

## 2020-09-21 ENCOUNTER — Encounter: Payer: Self-pay | Admitting: *Deleted

## 2020-09-21 ENCOUNTER — Encounter: Payer: Self-pay | Admitting: Cardiology

## 2020-09-21 VITALS — BP 120/60 | HR 72 | Ht 66.0 in | Wt 138.4 lb

## 2020-09-21 DIAGNOSIS — I6523 Occlusion and stenosis of bilateral carotid arteries: Secondary | ICD-10-CM

## 2020-09-21 DIAGNOSIS — I5022 Chronic systolic (congestive) heart failure: Secondary | ICD-10-CM | POA: Diagnosis not present

## 2020-09-21 DIAGNOSIS — J449 Chronic obstructive pulmonary disease, unspecified: Secondary | ICD-10-CM | POA: Diagnosis not present
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
## 2020-09-21 NOTE — Patient Instructions (Addendum)
Medication Instructions:  Your physician recommends that you continue on your current medications as directed. Please refer to the Current Medication list given to you today.  Labwork: none  Testing/Procedures: Your physician has requested that you have a carotid duplex. This test is an ultrasound of the carotid arteries in your neck. It looks at blood flow through these arteries that supply the brain with blood. Allow one hour for this exam. There are no restrictions or special instructions.  Follow-Up: Your physician recommends that you schedule a follow-up appointment in: 6 months You have been referred to [facility] Pulmonology  Any Other Special Instructions Will Be Listed Below (If Applicable).  If you need a refill on your cardiac medications before your next appointment, please call your pharmacy.

## 2020-09-27 ENCOUNTER — Other Ambulatory Visit: Payer: Self-pay

## 2020-09-27 ENCOUNTER — Ambulatory Visit: Payer: Medicare HMO | Admitting: Pulmonary Disease

## 2020-09-27 ENCOUNTER — Encounter: Payer: Self-pay | Admitting: Pulmonary Disease

## 2020-09-27 DIAGNOSIS — J449 Chronic obstructive pulmonary disease, unspecified: Secondary | ICD-10-CM

## 2020-09-27 DIAGNOSIS — R053 Chronic cough: Secondary | ICD-10-CM | POA: Diagnosis not present

## 2020-09-27 DIAGNOSIS — J9611 Chronic respiratory failure with hypoxia: Secondary | ICD-10-CM | POA: Diagnosis not present

## 2020-09-27 MED ORDER — BREZTRI AEROSPHERE 160-9-4.8 MCG/ACT IN AERO: 2.0000 | INHALATION_SPRAY | Freq: Two times a day (BID) | RESPIRATORY_TRACT | 0 refills | Status: DC

## 2020-09-27 MED ORDER — AMOXICILLIN-POT CLAVULANATE 875-125 MG PO TABS: 1.0000 | ORAL_TABLET | Freq: Two times a day (BID) | ORAL | 0 refills | Status: DC

## 2020-09-27 NOTE — Assessment & Plan Note (Signed)
Continue 2 L of oxygen during sleep 

## 2020-09-27 NOTE — Assessment & Plan Note (Signed)
Chronic cough may be related to [name] for recurrent aspiration.  We may also have to invoke usual suspects of GERD and postnasal drip  Have emphasized dysphagia 3 diet and will treat for aspiration Augmentin 875 bid x 7 days given productive sputum. If no relief, we may have to consider empiric treatment for GERD or stopping Devin Barton

## 2020-09-27 NOTE — Patient Instructions (Addendum)
Trial of Breztri 2 puffs twice daily - Take this INSTEAD of the nebulzer medication If this does not work , call me back & we will go back to neb Rx  Entresto may be causing the cough  Augmentin 875 bid x 7 days  Referral to lung cancer screening program Please take covid vaccine
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
## 2020-09-27 NOTE — Assessment & Plan Note (Addendum)
He has tried Trelegy in the past without success.  Trial of Breztri 2 puffs twice daily - Take this INSTEAD of the nebulzer medication If this does not work , call me back & we will go back to neb Rx -Pulmicort and duo nebs   Referral to lung cancer screening program Please take covid vaccine

## 2020-09-29 DIAGNOSIS — R339 Retention of urine, unspecified: Secondary | ICD-10-CM | POA: Diagnosis not present

## 2020-09-29 DIAGNOSIS — R3912 Poor urinary stream: Secondary | ICD-10-CM | POA: Diagnosis not present

## 2020-10-02 ENCOUNTER — Other Ambulatory Visit: Payer: Self-pay | Admitting: Urology

## 2020-10-03 ENCOUNTER — Telehealth: Payer: Self-pay | Admitting: Pulmonary Disease

## 2020-10-03 NOTE — Telephone Encounter (Signed)
Attempted to call back patient 3x, phone line rings busy.   Patient did say she does not need a call back however per last MD note patient should call back to we can try something else.   Will hold in triage

## 2020-10-04 NOTE — Telephone Encounter (Signed)
Called and spoke to [place], pt's spouse. She states the pt did not like the [place] and is back on the nebulizer. He does not want to try another inhaler at this time and will call back when he wants to try something different.    Will forward to Dr. Vassie Loll as [place] Childes.

## 2020-10-12 DIAGNOSIS — J449 Chronic obstructive pulmonary disease, unspecified: Secondary | ICD-10-CM | POA: Diagnosis not present

## 2020-10-12 DIAGNOSIS — I5021 Acute systolic (congestive) heart failure: Secondary | ICD-10-CM | POA: Diagnosis not present

## 2020-10-17 ENCOUNTER — Ambulatory Visit: Payer: Medicare HMO | Admitting: Urology

## 2020-10-20 ENCOUNTER — Ambulatory Visit: Payer: Medicare HMO | Admitting: Urology

## 2020-10-23 DIAGNOSIS — I509 Heart failure, unspecified: Secondary | ICD-10-CM | POA: Diagnosis not present

## 2020-10-23 DIAGNOSIS — I2510 Atherosclerotic heart disease of native coronary artery without angina pectoris: Secondary | ICD-10-CM | POA: Diagnosis not present

## 2020-10-23 DIAGNOSIS — J449 Chronic obstructive pulmonary disease, unspecified: Secondary | ICD-10-CM | POA: Diagnosis not present

## 2020-10-27 DIAGNOSIS — R3912 Poor urinary stream: Secondary | ICD-10-CM | POA: Diagnosis not present

## 2020-10-27 DIAGNOSIS — R339 Retention of urine, unspecified: Secondary | ICD-10-CM | POA: Diagnosis not present

## 2020-10-31 DIAGNOSIS — Z0000 Encounter for general adult medical examination without abnormal findings: Secondary | ICD-10-CM | POA: Diagnosis not present

## 2020-10-31 DIAGNOSIS — E782 Mixed hyperlipidemia: Secondary | ICD-10-CM | POA: Diagnosis not present

## 2020-10-31 DIAGNOSIS — Z6821 Body mass index (BMI) 21.0-21.9, adult: Secondary | ICD-10-CM | POA: Diagnosis not present

## 2020-10-31 DIAGNOSIS — Z1389 Encounter for screening for other disorder: Secondary | ICD-10-CM | POA: Diagnosis not present

## 2020-10-31 DIAGNOSIS — R053 Chronic cough: Secondary | ICD-10-CM | POA: Diagnosis not present

## 2020-10-31 DIAGNOSIS — Z1331 Encounter for screening for depression: Secondary | ICD-10-CM | POA: Diagnosis not present

## 2020-10-31 DIAGNOSIS — R300 Dysuria: Secondary | ICD-10-CM | POA: Diagnosis not present

## 2020-11-06 ENCOUNTER — Other Ambulatory Visit: Payer: Self-pay | Admitting: Cardiology

## 2020-11-12 DIAGNOSIS — J449 Chronic obstructive pulmonary disease, unspecified: Secondary | ICD-10-CM | POA: Diagnosis not present

## 2020-11-12 DIAGNOSIS — I5021 Acute systolic (congestive) heart failure: Secondary | ICD-10-CM | POA: Diagnosis not present

## 2020-11-14 ENCOUNTER — Other Ambulatory Visit: Payer: Self-pay | Admitting: Cardiology

## 2020-11-17 ENCOUNTER — Ambulatory Visit (INDEPENDENT_AMBULATORY_CARE_PROVIDER_SITE_OTHER): Payer: Medicare HMO

## 2020-11-17 DIAGNOSIS — I6523 Occlusion and stenosis of bilateral carotid arteries: Secondary | ICD-10-CM | POA: Diagnosis not present

## 2020-11-23 DIAGNOSIS — I509 Heart failure, unspecified: Secondary | ICD-10-CM | POA: Diagnosis not present

## 2020-11-23 DIAGNOSIS — J449 Chronic obstructive pulmonary disease, unspecified: Secondary | ICD-10-CM | POA: Diagnosis not present

## 2020-11-23 DIAGNOSIS — I2510 Atherosclerotic heart disease of native coronary artery without angina pectoris: Secondary | ICD-10-CM | POA: Diagnosis not present

## 2020-11-26 DIAGNOSIS — R3912 Poor urinary stream: Secondary | ICD-10-CM | POA: Diagnosis not present

## 2020-11-26 DIAGNOSIS — R339 Retention of urine, unspecified: Secondary | ICD-10-CM | POA: Diagnosis not present

## 2020-12-02 ENCOUNTER — Ambulatory Visit: Payer: Medicare HMO | Admitting: Urology

## 2020-12-07 ENCOUNTER — Encounter: Payer: Self-pay | Admitting: Urology

## 2020-12-07 ENCOUNTER — Other Ambulatory Visit: Payer: Self-pay

## 2020-12-07 ENCOUNTER — Telehealth (INDEPENDENT_AMBULATORY_CARE_PROVIDER_SITE_OTHER): Payer: Medicare HMO | Admitting: Urology

## 2020-12-07 DIAGNOSIS — N138 Other obstructive and reflux uropathy: Secondary | ICD-10-CM | POA: Diagnosis not present

## 2020-12-07 DIAGNOSIS — N401 Enlarged prostate with lower urinary tract symptoms: Secondary | ICD-10-CM | POA: Diagnosis not present

## 2020-12-07 DIAGNOSIS — R339 Retention of urine, unspecified: Secondary | ICD-10-CM
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
## 2020-12-07 NOTE — Patient Instructions (Signed)
Benign Prostatic Hyperplasia Benign prostatic hyperplasia (BPH) is an enlarged prostate gland that is caused by the normal aging process and not by cancer. The prostate is a walnut-sized gland that is involved in the production of semen. It is located in front of the rectum and below the bladder. The bladder stores urine and the urethra is the tube that carries the urine out of the body. The prostate may get bigger as a man gets older. An enlarged prostate can press on the urethra. This can make it harder to pass urine. The build-up of urine in the bladder can cause infection. Back pressure and infection may progress to bladder damage and kidney (renal) failure. What are the causes? This condition is part of a normal aging process. However, not all men develop problems from this condition. If the prostate enlarges away from the urethra, urine flow will not be blocked. If it enlarges toward the urethra and compresses it, there will be problems passing urine. What increases the risk? This condition is more likely to develop in men over the age of 50 years. What are the signs or symptoms? Symptoms of this condition include: Getting up often during the night to urinate. Needing to urinate frequently during the day. Difficulty starting urine flow. Decrease in size and strength of your urine stream. Leaking (dribbling) after urinating. Inability to pass urine. This needs immediate treatment. Inability to completely empty your bladder. Pain when you pass urine. This is more common if there is also an infection. Urinary tract infection (UTI). How is this diagnosed? This condition is diagnosed based on your medical history, a physical exam, and your symptoms. Tests will also be done, such as: A post-void bladder scan. This measures any amount of urine that may remain in your bladder after you finish urinating. A digital rectal exam. In a rectal exam, your health care provider checks your prostate by  putting a lubricated, gloved finger into your rectum to feel the back of your prostate gland. This exam detects the size of your gland and any abnormal lumps or growths. An exam of your urine (urinalysis). A prostate specific antigen (PSA) screening. This is a blood test used to screen for prostate cancer. An ultrasound. This test uses sound waves to electronically produce a picture of your prostate gland. Your health care provider may refer you to a specialist in kidney and prostate diseases (urologist). How is this treated? Once symptoms begin, your health care provider will monitor your condition (active surveillance or watchful waiting). Treatment for this condition will depend on the severity of your condition. Treatment may include: Observation and yearly exams. This may be the only treatment needed if your condition and symptoms are mild. Medicines to relieve your symptoms, including: Medicines to shrink the prostate. Medicines to relax the muscle of the prostate. Surgery in severe cases. Surgery may include: Prostatectomy. In this procedure, the prostate tissue is removed completely through an open incision or with a laparoscope or robotics. Transurethral resection of the prostate (TURP). In this procedure, a tool is inserted through the opening at the tip of the penis (urethra). It is used to cut away tissue of the inner core of the prostate. The pieces are removed through the same opening of the penis. This removes the blockage. Transurethral incision (TUIP). In this procedure, small cuts are made in the prostate. This lessens the prostate's pressure on the urethra. Transurethral microwave thermotherapy (TUMT). This procedure uses microwaves to create heat. The heat destroys and removes a   small amount of prostate tissue. Transurethral needle ablation (TUNA). This procedure uses radio frequencies to destroy and remove a small amount of prostate tissue. Interstitial laser coagulation (ILC).  This procedure uses a laser to destroy and remove a small amount of prostate tissue. Transurethral electrovaporization (TUVP). This procedure uses electrodes to destroy and remove a small amount of prostate tissue. Prostatic urethral lift. This procedure inserts an implant to push the lobes of the prostate away from the urethra. Follow these instructions at home: Take over-the-counter and prescription medicines only as told by your health care provider. Monitor your symptoms for any changes. Contact your health care provider with any changes. Avoid drinking large amounts of liquid before going to bed or out in public. Avoid or reduce how much caffeine or alcohol you drink. Give yourself time when you urinate. Keep all follow-up visits as told by your health care provider. This is important. Contact a health care provider if: You have unexplained back pain. Your symptoms do not get better with treatment. You develop side effects from the medicine you are taking. Your urine becomes very dark or has a bad smell. Your lower abdomen becomes distended and you have trouble passing your urine. Get help right away if: You have a fever or chills. You suddenly cannot urinate. You feel lightheaded, or very dizzy, or you faint. There are large amounts of blood or clots in the urine. Your urinary problems become hard to manage. You develop moderate to severe low back or flank pain. The flank is the side of your body between the ribs and the hip. These symptoms may represent a serious problem that is an emergency. Do not wait to see if the symptoms will go away. Get medical help right away. Call your local emergency services (911 in the [place] Do not drive yourself to the hospital. Summary Benign prostatic hyperplasia (BPH) is an enlarged prostate that is caused by the normal aging process and not by cancer. An enlarged prostate can press on the urethra. This can make it hard to pass urine. This  condition is part of a normal aging process and is more likely to develop in men over the age of 50 years. Get help right away if you suddenly cannot urinate. This information is not intended to replace advice given to you by your health care provider. Make sure you discuss any questions you have with your health care provider. Document Revised: 06/22/2020 Document Reviewed: 11/19/2019 Elsevier Patient Education  2022 Elsevier Inc.  

## 2020-12-08 ENCOUNTER — Other Ambulatory Visit: Payer: Self-pay | Admitting: Cardiology

## 2020-12-13 ENCOUNTER — Telehealth: Payer: Self-pay | Admitting: *Deleted

## 2020-12-13 DIAGNOSIS — I5021 Acute systolic (congestive) heart failure: Secondary | ICD-10-CM | POA: Diagnosis not present

## 2020-12-13 DIAGNOSIS — J449 Chronic obstructive pulmonary disease, unspecified: Secondary | ICD-10-CM | POA: Diagnosis not present

## 2020-12-13 NOTE — Telephone Encounter (Signed)
-----   Message from Antoine Poche, MD sent at 12/04/2020  8:38 AM EDT ----- Mild to moderate blockages in the arteries of the neck, we will continue to monitor   Dominga Ferry MD

## 2020-12-13 NOTE — Telephone Encounter (Signed)
Lesle Chris, LPN  8/75/6433  3:01 PM EDT Back to Top    Notified, copy to pcp.

## 2021-01-03 ENCOUNTER — Other Ambulatory Visit (HOSPITAL_COMMUNITY): Payer: Self-pay | Admitting: Cardiology

## 2021-01-03 DIAGNOSIS — I6523 Occlusion and stenosis of bilateral carotid arteries: Secondary | ICD-10-CM

## 2021-01-03 DIAGNOSIS — R339 Retention of urine, unspecified: Secondary | ICD-10-CM | POA: Diagnosis not present

## 2021-01-03 DIAGNOSIS — R3912 Poor urinary stream: Secondary | ICD-10-CM | POA: Diagnosis not present

## 2021-01-12 DIAGNOSIS — I5021 Acute systolic (congestive) heart failure: Secondary | ICD-10-CM | POA: Diagnosis not present

## 2021-01-12 DIAGNOSIS — J449 Chronic obstructive pulmonary disease, unspecified: Secondary | ICD-10-CM | POA: Diagnosis not present

## 2021-01-30 ENCOUNTER — Ambulatory Visit: Payer: Medicare HMO | Admitting: Pulmonary Disease

## 2021-01-30 ENCOUNTER — Encounter: Payer: Self-pay | Admitting: Pulmonary Disease

## 2021-01-30 ENCOUNTER — Other Ambulatory Visit: Payer: Self-pay

## 2021-01-30 DIAGNOSIS — J449 Chronic obstructive pulmonary disease, unspecified: Secondary | ICD-10-CM | POA: Diagnosis not present

## 2021-01-30 DIAGNOSIS — J9611 Chronic respiratory failure with hypoxia: Secondary | ICD-10-CM

## 2021-01-30 MED ORDER — ARFORMOTEROL TARTRATE 15 MCG/2ML IN NEBU: 15.0000 ug | INHALATION_SOLUTION | Freq: Two times a day (BID) | RESPIRATORY_TRACT | 0 refills | Status: DC

## 2021-01-30 MED ORDER — SODIUM CHLORIDE 3 % IN NEBU
INHALATION_SOLUTION | Freq: Every day | RESPIRATORY_TRACT | 12 refills | Status: DC | PRN
Start: 2021-01-30 — End: 2021-05-01

## 2021-01-30 MED ORDER — BUDESONIDE 0.5 MG/2ML IN SUSP: 0.5000 mg | Freq: Every day | RESPIRATORY_TRACT | 0 refills | Status: DC

## 2021-01-30 MED ORDER — IPRATROPIUM-ALBUTEROL 0.5-2.5 (3) MG/3ML IN SOLN: 3.0000 mL | Freq: Four times a day (QID) | RESPIRATORY_TRACT | 3 refills | Status: DC | PRN

## 2021-01-30 NOTE — Assessment & Plan Note (Signed)
Continue to use oxygen during sleep.  He does not need this in the daytime except during exacerbations

## 2021-01-30 NOTE — Patient Instructions (Signed)
   We will change nebulizer regimen :  Budesonide 0.5 + Brovana nebs TWICE daily  Rx for Duonebs every 6h as needed only   Rx for hypertonic saline nebs to use once dialy as needed (instead of mucinex)

## 2021-01-30 NOTE — Assessment & Plan Note (Signed)
We discussed signs and symptoms of exacerbation.  Augmentin really worked for him the last time and wife preferred for this to be called in in the future. He did not tolerate combination triple MDI and prefers nebulizer medications.  I will simplify his regimen to- Budesonide 0.5 + Brovana nebs TWICE daily  Rx for Duonebs every 6h as needed only   Rx for hypertonic saline nebs to use once dialy as needed (instead of mucinex)

## 2021-01-30 NOTE — Progress Notes (Signed)
   Subjective:    Patient ID: Devin Barton, male    DOB: 04/12/1945, 75 y.o.   MRN: [id number]  HPI  75 yo heavy ex-smoker for FU of COPD, on O2 during sleep -[name]  He smoked for 60 pack years until he quit in 2020  PMH -CAD status post CABG,HFrEF EF 25% recovered to 50% on echo 09/2018, paroxysmal atrial fibrillation, chronic dysphagia >> dys 3 diet , covid  infection 02/2020  -hospitalized 05/2020 for shortness of breath, fever and treated for aspiration pneumonia and COPD exacerbation   Meds - did not like trelegy or Breztri  Chief Complaint  Patient presents with   Follow-up    Not using Breztri inhaler. Coughing up white mucus. SOB is same since last OV.   Using 1.5 L O2 at night    Initial OV  >> Chronic cough may be related to [facility] or recurrent aspiration, Rx augmentin, trial of Breztri  Accompanied by his wife today. He did not like Breztri and has reverted back to using Pulmicort and ipratropium nebs twice daily.  He continues to have phlegm production, minimal amounts, clear. Wife states he tries to comply with his dysphagia 3 diet. He has been on Mucinex forever and she requests an alternative  Significant tests/ events reviewed MBS 05/2020 moderate oropharyngeal dsyphagia ,significant pooling in the pyriforms and trace penetration during/after the swallow with thins. Dysphagia 3 (Mech soft) solids;Nectar thick liquid   09/2016 PFTs severe obstruction, ratio 36 , FEV1 0.75/25% improved to 0.98 with bronchodilator , FVC 51%   02/2017 CT chest right lower lobe groundglass opacities  Review of Systems  neg for any significant sore throat, dysphagia, itching, sneezing, nasal congestion or excess/ purulent secretions, fever, chills, sweats, unintended wt loss, pleuritic or exertional cp, hempoptysis, orthopnea pnd or change in chronic leg swelling. Also denies presyncope, palpitations, heartburn, abdominal pain, nausea, vomiting, diarrhea or change in bowel or  urinary habits, dysuria,hematuria, rash, arthralgias, visual complaints, headache, numbness weakness or ataxia.     Objective:   Physical Exam  Gen. Pleasant, elderly,well-nourished, in no distress ENT - no thrush, no pallor/icterus,no post nasal drip, edentulous Neck: No JVD, no thyromegaly, no carotid bruits Lungs: no use of accessory muscles, no dullness to percussion, clear without rales or rhonchi  Cardiovascular: Rhythm regular, heart sounds  normal, no murmurs or gallops, no peripheral edema Musculoskeletal: No deformities, no cyanosis or clubbing        Assessment & Plan:

## 2021-02-06 ENCOUNTER — Other Ambulatory Visit: Payer: Self-pay | Admitting: Cardiology

## 2021-02-09 ENCOUNTER — Telehealth: Payer: Self-pay | Admitting: Pulmonary Disease

## 2021-02-09 NOTE — Telephone Encounter (Signed)
Called and spoke to patient to let him know the prescription was sent in by Dr. Vassie Loll and how to take it. He voiced understanding. Nothing further needed at this time.

## 2021-02-09 NOTE — Telephone Encounter (Signed)
Primary Pulmonologist: Dr. Vassie Loll   Last office visit and with whom: Dr. Vassie Loll 01-30-21 What do we see them for (pulmonary problems): Chronic resp failure. COPD.  Last OV assessment/plan: See below   Was appointment offered to patient (explain)?  no   Reason for call: Patients wife called saying patient is wheezing, coughing up clear mucus for a few days. No fever. No O2 desats. Has tried Nyquil but states it hasn't worked.  She is requesting an antibiotic.   Dr. Vassie Loll please advise.   No Known Allergies  Immunization History  Administered Date(s) Administered   Fluad Quad(high Dose 65+) 05/28/2020   Influenza, High Dose Seasonal PF 01/27/2018, 12/25/2018   Pneumococcal Polysaccharide-23 08/14/2017    Assessment & Plan:           Assessment & Plan Note by Oretha Milch, MD at 01/30/2021 12:10 PM  Author: Oretha Milch, MD Author Type: Physician Filed: 01/30/2021 12:10 PM  Note Status: Written Cosign: Cosign Not Required Encounter Date: 01/30/2021  Problem: COPD with chronic bronchitis and emphysema (HCC)  Editor: Oretha Milch, MD (Physician)             We discussed signs and symptoms of exacerbation.  Augmentin really worked for him the last time and wife preferred for this to be called in in the future. He did not tolerate combination triple MDI and prefers nebulizer medications.   I will simplify his regimen to- Budesonide 0.5 + Brovana nebs TWICE daily  Rx for Duonebs every 6h as needed only    Rx for hypertonic saline nebs to use once dialy as needed (instead of mucinex)        Assessment & Plan Note by Oretha Milch, MD at 01/30/2021 12:09 PM  Author: Oretha Milch, MD Author Type: Physician Filed: 01/30/2021 12:09 PM  Note Status: Written Cosign: Cosign Not Required Encounter Date: 01/30/2021  Problem: Chronic respiratory failure with hypoxia [facility])  Editor: Oretha Milch, MD (Physician)             Continue to use oxygen during sleep.  He does not need this in  the daytime except during exacerbations        Patient Instructions by Oretha Milch, MD at 01/30/2021 11:30 AM  Author: Oretha Milch, MD Author Type: Physician Filed: 01/30/2021 12:06 PM  Note Status: Signed Cosign: Cosign Not Required Encounter Date: 01/30/2021  Editor: Oretha Milch, MD (Physician)                We will change nebulizer regimen :   Budesonide 0.5 + Brovana nebs TWICE daily  Rx for Duonebs every 6h as needed only    Rx for hypertonic saline nebs to use once dialy as needed (instead of mucinex)

## 2021-02-12 DIAGNOSIS — I5021 Acute systolic (congestive) heart failure: Secondary | ICD-10-CM | POA: Diagnosis not present

## 2021-02-12 DIAGNOSIS — J449 Chronic obstructive pulmonary disease, unspecified: Secondary | ICD-10-CM | POA: Diagnosis not present

## 2021-02-13 ENCOUNTER — Telehealth: Payer: Self-pay | Admitting: Pulmonary Disease

## 2021-02-13 MED ORDER — AMOXICILLIN-POT CLAVULANATE 875-125 MG PO TABS: 1.0000 | ORAL_TABLET | Freq: Two times a day (BID) | ORAL | 0 refills | Status: DC

## 2021-02-13 NOTE — Telephone Encounter (Signed)
He has recurrent aspiration so low threshold to treat with antibiotics  I sent a prescription for Augmentin 875 twice daily for 7 days    This rx was not sent to the pharmacy.  I have sent this to the pharmacy.

## 2021-02-17 DIAGNOSIS — R059 Cough, unspecified: Secondary | ICD-10-CM | POA: Diagnosis not present

## 2021-02-17 DIAGNOSIS — J441 Chronic obstructive pulmonary disease with (acute) exacerbation: Secondary | ICD-10-CM | POA: Diagnosis not present

## 2021-02-22 ENCOUNTER — Telehealth: Payer: Self-pay | Admitting: Urology

## 2021-02-22 NOTE — Telephone Encounter (Signed)
Spoke with wife and made her aware no fax has been seen concerning patient.  Wife made aware that office will reach out to [employer] to fix any issue.

## 2021-02-22 NOTE — Telephone Encounter (Signed)
Patient's wife called the office to report that they are running low on catheter supplies.  They contacted the catheter supply company and was told that they are waiting on a signature on the order forms from our office.

## 2021-02-23 NOTE — Telephone Encounter (Signed)
Spoke with Lea from [employer] and she states they will fax over [employer] paperwork to be signed. Due to patient insurance an official [employer]  form need to be signed. Lea given correct fax number and states we should receive fax later today or tomorrow. Wife called and made aware.

## 2021-02-24 ENCOUNTER — Telehealth: Payer: Self-pay

## 2021-02-24 NOTE — Telephone Encounter (Signed)
Wife called and made aware that [employer] paperwork was received, filled out, and faxed back to [employer].  Wife voiced understanding.

## 2021-02-24 NOTE — Telephone Encounter (Signed)
Pt left a Voice Mail:  Pt needing prescription filled.   Needing it filled today.  Has been waiting.  Please advise.  Call back:  [phone]  Thanks, Rosey Bath

## 2021-03-01 NOTE — Telephone Encounter (Signed)
Dr. Ronne Binning made aware of [employer] need for updated clinicals to clarify patient's need for coude tip instead of straight tip.

## 2021-03-01 NOTE — Telephone Encounter (Signed)
Wife still having issues with getting patient supplies.  Please advise.  Thanks, Rosey Bath

## 2021-03-02 NOTE — Telephone Encounter (Signed)
Updated clinicals obtained and faxed to [employer].

## 2021-03-05 ENCOUNTER — Other Ambulatory Visit: Payer: Self-pay | Admitting: Cardiology

## 2021-03-07 DIAGNOSIS — R339 Retention of urine, unspecified: Secondary | ICD-10-CM | POA: Diagnosis not present

## 2021-03-07 DIAGNOSIS — N401 Enlarged prostate with lower urinary tract symptoms: Secondary | ICD-10-CM | POA: Diagnosis not present

## 2021-03-07 DIAGNOSIS — N138 Other obstructive and reflux uropathy: Secondary | ICD-10-CM | POA: Diagnosis not present

## 2021-03-14 DIAGNOSIS — J449 Chronic obstructive pulmonary disease, unspecified: Secondary | ICD-10-CM | POA: Diagnosis not present

## 2021-03-14 DIAGNOSIS — I5021 Acute systolic (congestive) heart failure: Secondary | ICD-10-CM | POA: Diagnosis not present

## 2021-03-15 DIAGNOSIS — N400 Enlarged prostate without lower urinary tract symptoms: Secondary | ICD-10-CM | POA: Diagnosis not present

## 2021-03-15 DIAGNOSIS — Z9981 Dependence on supplemental oxygen: Secondary | ICD-10-CM | POA: Diagnosis not present

## 2021-03-15 DIAGNOSIS — Z87891 Personal history of nicotine dependence: Secondary | ICD-10-CM | POA: Diagnosis not present

## 2021-03-15 DIAGNOSIS — I509 Heart failure, unspecified: Secondary | ICD-10-CM | POA: Diagnosis not present

## 2021-03-15 DIAGNOSIS — J9611 Chronic respiratory failure with hypoxia: Secondary | ICD-10-CM | POA: Diagnosis not present

## 2021-03-15 DIAGNOSIS — E785 Hyperlipidemia, unspecified: Secondary | ICD-10-CM | POA: Diagnosis not present

## 2021-03-15 DIAGNOSIS — Z7951 Long term (current) use of inhaled steroids: Secondary | ICD-10-CM | POA: Diagnosis not present

## 2021-03-15 DIAGNOSIS — E261 Secondary hyperaldosteronism: Secondary | ICD-10-CM | POA: Diagnosis not present

## 2021-03-15 DIAGNOSIS — J449 Chronic obstructive pulmonary disease, unspecified: Secondary | ICD-10-CM | POA: Diagnosis not present

## 2021-03-23 ENCOUNTER — Other Ambulatory Visit: Payer: Self-pay

## 2021-03-23 ENCOUNTER — Ambulatory Visit: Payer: Medicare HMO | Admitting: Cardiology

## 2021-03-23 ENCOUNTER — Encounter: Payer: Self-pay | Admitting: Cardiology

## 2021-03-23 VITALS — BP 100/60 | HR 68 | Ht 68.0 in | Wt 139.6 lb

## 2021-03-23 DIAGNOSIS — I6523 Occlusion and stenosis of bilateral carotid arteries: Secondary | ICD-10-CM

## 2021-03-23 DIAGNOSIS — I5022 Chronic systolic (congestive) heart failure: Secondary | ICD-10-CM | POA: Diagnosis not present

## 2021-03-23 DIAGNOSIS — I2510 Atherosclerotic heart disease of native coronary artery without angina pectoris: Secondary | ICD-10-CM

## 2021-03-23 MED ORDER — LOSARTAN POTASSIUM 25 MG PO TABS: 25.0000 mg | ORAL_TABLET | Freq: Every day | ORAL | 6 refills | Status: DC

## 2021-03-23 NOTE — Patient Instructions (Signed)
Medication Instructions:  Stop Entresto  Begin Losaran 25mg  daily  Continue all other medications.     Labwork: none  Testing/Procedures: none  Follow-Up: 4 months   Any Other Special Instructions Will Be Listed Below (If Applicable). Please call the office in 1 month to update on your cough.   If you need a refill on your cardiac medications before your next appointment, please call your pharmacy.
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
## 2021-04-14 DIAGNOSIS — J449 Chronic obstructive pulmonary disease, unspecified: Secondary | ICD-10-CM | POA: Diagnosis not present

## 2021-04-14 DIAGNOSIS — I5021 Acute systolic (congestive) heart failure: Secondary | ICD-10-CM | POA: Diagnosis not present

## 2021-04-26 ENCOUNTER — Telehealth: Payer: Self-pay | Admitting: Cardiology

## 2021-04-26 NOTE — Telephone Encounter (Signed)
° °  Pt c/o medication issue:  1. Name of Medication: entresto   2. How are you currently taking this medication (dosage and times per day)?   3. Are you having a reaction (difficulty breathing--STAT)?   4. What is your medication issue? Pt's wife said, pt is feeling a lot better after stopping entresto, he stopped coughing

## 2021-04-27 NOTE — Telephone Encounter (Signed)
Allergy profile updated

## 2021-04-27 NOTE — Telephone Encounter (Signed)
Please list entresto as allergy with cough   Dominga Ferry MD

## 2021-04-28 ENCOUNTER — Other Ambulatory Visit: Payer: Self-pay

## 2021-04-29 ENCOUNTER — Other Ambulatory Visit: Payer: Self-pay | Admitting: Pulmonary Disease

## 2021-05-01 ENCOUNTER — Encounter: Payer: Self-pay | Admitting: Pulmonary Disease

## 2021-05-01 ENCOUNTER — Ambulatory Visit: Payer: Medicare HMO | Admitting: Pulmonary Disease

## 2021-05-01 ENCOUNTER — Other Ambulatory Visit: Payer: Self-pay

## 2021-05-01 DIAGNOSIS — R053 Chronic cough: Secondary | ICD-10-CM

## 2021-05-01 DIAGNOSIS — J449 Chronic obstructive pulmonary disease, unspecified: Secondary | ICD-10-CM | POA: Diagnosis not present

## 2021-05-01 DIAGNOSIS — J9611 Chronic respiratory failure with hypoxia: Secondary | ICD-10-CM | POA: Diagnosis not present

## 2021-05-01 MED ORDER — ARFORMOTEROL TARTRATE 15 MCG/2ML IN NEBU: 15.0000 ug | INHALATION_SOLUTION | Freq: Two times a day (BID) | RESPIRATORY_TRACT | 3 refills | Status: DC

## 2021-05-01 NOTE — Assessment & Plan Note (Addendum)
Current regimen of budesonide and Brovana combination seems to work, I asked him to take this twice daily to give him around-the-clock effect. He will use DuoNebs only as needed We discussed signs and symptoms and plan for COPD exacerbation -he will call if he needs an antibiotic/prednisone

## 2021-05-01 NOTE — Assessment & Plan Note (Addendum)
Continue oxygen during sleep Does not seem to require it in the daytime except during an exacerbation They live in [place] and he does not want to participate in the center-based pulmonary rehab program

## 2021-05-01 NOTE — Assessment & Plan Note (Signed)
Seems to have been related to [facility] and is now resolved

## 2021-05-01 NOTE — Progress Notes (Signed)
° °  Subjective:    Patient ID: Devin Barton, male    DOB: 1946/01/09, 76 y.o.   MRN: [id number]  HPI  76 yo heavy ex-smoker for FU of COPD, on O2 during sleep -wife Starla Link  He smoked for 60 pack years until he quit in 2020   PMH -CAD status post CABG,HFrEF EF 25% recovered to 50% on echo 09/2018, - paroxysmal atrial fibrillation,  -chronic dysphagia >> dys 3 diet ,  -covid  infection 02/2020   -hospitalized 05/2020 for shortness of breath, fever and treated for aspiration pneumonia and COPD exacerbation     Meds - did not like trelegy or Breztri  Chief Complaint  Patient presents with   Follow-up    No concerns since last OV.    Initial OV  >> Chronic cough may be related to [facility] or recurrent aspiration, Rx augmentin, trial of Breztri  Last OV 01/2021 >> simplify his regimen to- Budesonide 0.5 + Brovana nebs TWICE daily  Rx for Duonebs every 6h as needed only    Rx for hypertonic saline nebs to use once dialy as needed (instead of mucinex)  He has done well over the last 3 to 4 months.  He only uses oxygen at night.  He is only using his nebs once daily. Less active per his wife Starla Link, who provides additional history Compliant with his dysphagia diet He has stopped using saline nebs and uses Mucinex instead  Reviewed last cardiology consultation September, Delene Loll was stopped and this really helped with his cough, losartan was added   Significant tests/ events reviewed  MBS 05/2020 moderate oropharyngeal dsyphagia ,significant pooling in the pyriforms and trace penetration during/after the swallow with thins. Dysphagia 3 (Mech soft) solids;Nectar thick liquid   09/2016 PFTs severe obstruction, ratio 36 , FEV1 0.75/25% improved to 0.98 with bronchodilator , FVC 51%   02/2017 CT chest right lower lobe groundglass opacities  Review of Systems neg for any significant sore throat, dysphagia, itching, sneezing, nasal congestion or excess/ purulent secretions, fever, chills,  sweats, unintended wt loss, pleuritic or exertional cp, hempoptysis, orthopnea pnd or change in chronic leg swelling. Also denies presyncope, palpitations, heartburn, abdominal pain, nausea, vomiting, diarrhea or change in bowel or urinary habits, dysuria,hematuria, rash, arthralgias, visual complaints, headache, numbness weakness or ataxia.     Objective:   Physical Exam  Gen. Pleasant, thin, elderly man , in no distress ENT - no thrush, no pallor/icterus,no post nasal drip Neck: No JVD, no thyromegaly, no carotid bruits Lungs: no use of accessory muscles, no dullness to percussion, clear without rales or rhonchi  Cardiovascular: Rhythm regular, heart sounds  normal, no murmurs or gallops, no peripheral edema Musculoskeletal: No deformities, no cyanosis or clubbing        Assessment & Plan:

## 2021-05-01 NOTE — Patient Instructions (Signed)
X budesonide nebs twice daily with brovana

## 2021-05-11 ENCOUNTER — Other Ambulatory Visit: Payer: Self-pay

## 2021-05-11 MED ORDER — ARFORMOTEROL TARTRATE 15 MCG/2ML IN NEBU: 15.0000 ug | INHALATION_SOLUTION | Freq: Two times a day (BID) | RESPIRATORY_TRACT | 3 refills | Status: DC

## 2021-05-15 DIAGNOSIS — I5021 Acute systolic (congestive) heart failure: Secondary | ICD-10-CM | POA: Diagnosis not present

## 2021-05-15 DIAGNOSIS — J449 Chronic obstructive pulmonary disease, unspecified: Secondary | ICD-10-CM | POA: Diagnosis not present

## 2021-05-23 ENCOUNTER — Telehealth: Payer: Self-pay | Admitting: Pharmacy Technician

## 2021-06-12 DIAGNOSIS — I5021 Acute systolic (congestive) heart failure: Secondary | ICD-10-CM | POA: Diagnosis not present

## 2021-06-12 DIAGNOSIS — J449 Chronic obstructive pulmonary disease, unspecified: Secondary | ICD-10-CM | POA: Diagnosis not present

## 2021-06-13 DIAGNOSIS — R339 Retention of urine, unspecified: Secondary | ICD-10-CM | POA: Diagnosis not present

## 2021-06-13 DIAGNOSIS — N401 Enlarged prostate with lower urinary tract symptoms: Secondary | ICD-10-CM | POA: Diagnosis not present

## 2021-06-19 ENCOUNTER — Telehealth: Payer: Self-pay | Admitting: Pulmonary Disease

## 2021-06-20 NOTE — Telephone Encounter (Signed)
Lm for patient.  

## 2021-06-20 NOTE — Telephone Encounter (Signed)
Called and spoke with patient. Patient stated nebulizer medication was on back order and unsure when it may be available.   ?Patient requested a alternative medication in place of Duoneb. ?[facility] pharmacy is [facility]. ? ? ?Message routed to [facility] to advise ?

## 2021-06-21 ENCOUNTER — Other Ambulatory Visit: Payer: Self-pay

## 2021-06-21 MED ORDER — ALBUTEROL SULFATE (2.5 MG/3ML) 0.083% IN NEBU: 2.5000 mg | INHALATION_SOLUTION | Freq: Four times a day (QID) | RESPIRATORY_TRACT | 2 refills | Status: DC | PRN

## 2021-06-21 MED ORDER — IPRATROPIUM BROMIDE 0.02 % IN SOLN: 0.5000 mg | Freq: Four times a day (QID) | RESPIRATORY_TRACT | 2 refills | Status: DC | PRN

## 2021-06-21 NOTE — Telephone Encounter (Signed)
Pt's wife returning call.  [phone].   ?

## 2021-06-21 NOTE — Telephone Encounter (Signed)
Called and spoke to patients wife about recs from Dr. Vassie Loll. Sent albuterol neb solution and atrovent solution in to [facility]. Wife will call for any further issues  ?

## 2021-07-03 ENCOUNTER — Other Ambulatory Visit: Payer: Self-pay | Admitting: Cardiology

## 2021-07-06 ENCOUNTER — Other Ambulatory Visit: Payer: Self-pay | Admitting: Pulmonary Disease

## 2021-07-10 ENCOUNTER — Other Ambulatory Visit: Payer: Self-pay | Admitting: Urology

## 2021-07-10 ENCOUNTER — Other Ambulatory Visit: Payer: Self-pay | Admitting: Pulmonary Disease

## 2021-07-10 DIAGNOSIS — J449 Chronic obstructive pulmonary disease, unspecified: Secondary | ICD-10-CM

## 2021-07-13 DIAGNOSIS — J449 Chronic obstructive pulmonary disease, unspecified: Secondary | ICD-10-CM | POA: Diagnosis not present

## 2021-07-13 DIAGNOSIS — I5021 Acute systolic (congestive) heart failure: Secondary | ICD-10-CM | POA: Diagnosis not present

## 2021-07-19 DIAGNOSIS — R339 Retention of urine, unspecified: Secondary | ICD-10-CM | POA: Diagnosis not present

## 2021-07-19 DIAGNOSIS — N138 Other obstructive and reflux uropathy: Secondary | ICD-10-CM | POA: Diagnosis not present

## 2021-07-19 DIAGNOSIS — N401 Enlarged prostate with lower urinary tract symptoms: Secondary | ICD-10-CM | POA: Diagnosis not present

## 2021-07-20 DIAGNOSIS — N138 Other obstructive and reflux uropathy: Secondary | ICD-10-CM | POA: Diagnosis not present

## 2021-07-20 DIAGNOSIS — N401 Enlarged prostate with lower urinary tract symptoms: Secondary | ICD-10-CM | POA: Diagnosis not present

## 2021-07-20 DIAGNOSIS — R339 Retention of urine, unspecified: Secondary | ICD-10-CM | POA: Diagnosis not present

## 2021-07-21 ENCOUNTER — Ambulatory Visit: Payer: Medicare HMO | Admitting: Cardiology

## 2021-07-21 ENCOUNTER — Encounter: Payer: Self-pay | Admitting: Cardiology

## 2021-07-21 VITALS — BP 130/72 | HR 72 | Ht 68.0 in | Wt 146.4 lb

## 2021-07-21 DIAGNOSIS — Z79899 Other long term (current) drug therapy: Secondary | ICD-10-CM

## 2021-07-21 DIAGNOSIS — I480 Paroxysmal atrial fibrillation: Secondary | ICD-10-CM

## 2021-07-21 DIAGNOSIS — R634 Abnormal weight loss: Secondary | ICD-10-CM | POA: Diagnosis not present

## 2021-07-21 DIAGNOSIS — R7309 Other abnormal glucose: Secondary | ICD-10-CM

## 2021-07-21 DIAGNOSIS — I2510 Atherosclerotic heart disease of native coronary artery without angina pectoris: Secondary | ICD-10-CM

## 2021-07-21 DIAGNOSIS — I5022 Chronic systolic (congestive) heart failure: Secondary | ICD-10-CM | POA: Diagnosis not present

## 2021-07-21 NOTE — Patient Instructions (Addendum)
Medication Instructions:  ?Continue all current medications. ? ?Labwork: ?FLP, HGA1C, MG, TSH, CBC, BMET  - orders given today ?Reminder:  Nothing to eat or drink after 12 midnight prior to labs. ?Office will contact with results via phone or letter.    ? ?Testing/Procedures: ?none ? ?Follow-Up: ?6 months  ? ?Any Other Special Instructions Will Be Listed Below (If Applicable). ? ? ?If you need a refill on your cardiac medications before your next appointment, please call your pharmacy. ? ?
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
## 2021-07-23 DIAGNOSIS — I2510 Atherosclerotic heart disease of native coronary artery without angina pectoris: Secondary | ICD-10-CM | POA: Diagnosis not present

## 2021-07-23 DIAGNOSIS — I509 Heart failure, unspecified: Secondary | ICD-10-CM | POA: Diagnosis not present

## 2021-07-23 DIAGNOSIS — J449 Chronic obstructive pulmonary disease, unspecified: Secondary | ICD-10-CM | POA: Diagnosis not present

## 2021-07-24 DIAGNOSIS — I5022 Chronic systolic (congestive) heart failure: Secondary | ICD-10-CM | POA: Diagnosis not present

## 2021-07-24 DIAGNOSIS — I480 Paroxysmal atrial fibrillation: Secondary | ICD-10-CM | POA: Diagnosis not present

## 2021-07-24 DIAGNOSIS — I2510 Atherosclerotic heart disease of native coronary artery without angina pectoris: Secondary | ICD-10-CM | POA: Diagnosis not present

## 2021-07-24 DIAGNOSIS — R7309 Other abnormal glucose: Secondary | ICD-10-CM | POA: Diagnosis not present

## 2021-07-24 DIAGNOSIS — R634 Abnormal weight loss: Secondary | ICD-10-CM | POA: Diagnosis not present

## 2021-07-24 DIAGNOSIS — Z79899 Other long term (current) drug therapy: Secondary | ICD-10-CM | POA: Diagnosis not present

## 2021-07-25 ENCOUNTER — Telehealth: Payer: Self-pay | Admitting: Pulmonary Disease

## 2021-07-25 LAB — BASIC METABOLIC PANEL
BUN/Creatinine Ratio: 11 (ref 10–24)
BUN: 10 mg/dL (ref 8–27)
CO2: 27 mmol/L (ref 20–29)
Calcium: 9.8 mg/dL (ref 8.6–10.2)
Chloride: 96 mmol/L (ref 96–106)
Creatinine, Ser: 0.89 mg/dL (ref 0.76–1.27)
Glucose: 100 mg/dL — ABNORMAL HIGH (ref 70–99)
Potassium: 4.3 mmol/L (ref 3.5–5.2)
Sodium: 138 mmol/L (ref 134–144)
eGFR: 89 mL/min/{1.73_m2} (ref >59)

## 2021-07-25 LAB — LIPID PANEL
Chol/HDL Ratio: 2.7 ratio (ref 0.0–5.0)
Cholesterol, Total: 123 mg/dL (ref 100–199)
HDL: 45 mg/dL (ref >39)
LDL Chol Calc (NIH): 63 mg/dL (ref 0–99)
Triglycerides: 75 mg/dL (ref 0–149)
VLDL Cholesterol Cal: 15 mg/dL (ref 5–40)

## 2021-07-25 LAB — CBC
Hematocrit: 38.1 % (ref 37.5–51.0)
Hemoglobin: 13 g/dL (ref 13.0–17.7)
MCH: 31.6 pg (ref 26.6–33.0)
MCHC: 34.1 g/dL (ref 31.5–35.7)
MCV: 93 fL (ref 79–97)
Platelets: 277 10*3/uL (ref 150–450)
RBC: 4.11 x10E6/uL — ABNORMAL LOW (ref 4.14–5.80)
RDW: 13.1 % (ref 11.6–15.4)
WBC: 8.5 10*3/uL (ref 3.4–10.8)

## 2021-07-25 LAB — TSH: TSH: 2.01 u[IU]/mL (ref 0.450–4.500)

## 2021-07-25 LAB — HEMOGLOBIN A1C
Est. average glucose Bld gHb Est-mCnc: 114 mg/dL
Hgb A1c MFr Bld: 5.6 % (ref 4.8–5.6)

## 2021-07-25 LAB — MAGNESIUM: Magnesium: 2.1 mg/dL (ref 1.6–2.3)

## 2021-07-25 MED ORDER — PREDNISONE 10 MG PO TABS: ORAL_TABLET | ORAL | 0 refills | Status: AC

## 2021-07-25 NOTE — Telephone Encounter (Signed)
Primary Pulmonologist: Dr. Vassie Loll  ?Last office visit and with whom: Dr. Vassie Loll  05/01/2021 ?What do we see them for (pulmonary problems): COPD Chronic respiratory failure  ?Last OV assessment/plan: see below  ? ?Was appointment offered to patient (explain)?  None available in [facility] office  ? ? ?Reason for call: Pt's wife states he's having a COPD flare-up.  Coughing but no sputum. SOB.  ?Went to cardiologist on Friday and heard wheezing.  ? Requesting antibiotic and prednisone.  ? Unable to get nebulizer medication because of back order  ?Received Symbicort and albuterol inhaler from PCP since he cant get neb solution  ?Would like [facility]  ?  ?Allergies  ?Allergen Reactions  ? Entresto [Sacubitril-Valsartan] Cough  ? ? ?Immunization History  ?Administered Date(s) Administered  ? Fluad Quad(high Dose 65+) 05/28/2020  ? Influenza Split 01/04/2021  ? Influenza, High Dose Seasonal PF 01/27/2018, 12/25/2018  ? Pneumococcal Polysaccharide-23 08/14/2017  ? Pneumococcal-Unspecified 05/28/2012  ? Tdap 12/20/2020  ? Zoster Recombinat (Shingrix) 12/20/2020  ?  ?Assessment & Plan:  ?  ?   ?  ? ? Assessment & Plan Note by Oretha Milch, MD at 05/01/2021 10:18 AM ? ?Author: Oretha Milch, MD Author Type: Physician Filed: 05/01/2021 10:18 AM  ?Note Status: Written Cosign: Cosign Not Required Encounter Date: 05/01/2021  ?Problem: Chronic cough  ?Editor: Oretha Milch, MD (Physician)      ?       ?Seems to have been related to [facility] and is now resolved ?  ?  ? ? ? Assessment & Plan Note by Oretha Milch, MD at 05/01/2021 10:16 AM ? ?Author: Oretha Milch, MD Author Type: Physician Filed: 05/01/2021 10:18 AM  ?Note Status: [place] Cater: Cosign Not Required Encounter Date: 05/01/2021  ?Problem: Chronic respiratory failure with hypoxia (HCC)  ?Editor: Oretha Milch, MD (Physician)      ?Prior Versions: 1. Oretha Milch, MD (Physician) at 05/01/2021 10:17 AM - Written  ?Continue oxygen during sleep ?Does not seem to require it in the  daytime except during an exacerbation ?They live in [place] and he does not want to participate in the center-based pulmonary rehab program ?  ?  ? ? ? Assessment & Plan Note by Oretha Milch, MD at 05/01/2021 10:17 AM ? ?Author: Oretha Milch, MD Author Type: Physician Filed: 05/01/2021 10:17 AM  ?Note Status: Edited Cosign: Cosign Not Required Encounter Date: 05/01/2021  ?Problem: COPD with chronic bronchitis and emphysema (HCC)  ?Editor: Oretha Milch, MD (Physician)      ?Prior Versions: 1. Oretha Milch, MD (Physician) at 05/01/2021 10:17 AM - Written  ?Current regimen of budesonide and Brovana combination seems to work, I asked him to take this twice daily to give him around-the-clock effect. ?He will use DuoNebs only as needed ?We discussed signs and symptoms and plan for COPD exacerbation -he will call if he needs an antibiotic/prednisone ?  ?  ?  ?  ? ? ? Patient Instructions by Oretha Milch, MD at 05/01/2021 10:00 AM ? ?Author: Oretha Milch, MD Author Type: Physician Filed: 05/01/2021 10:11 AM  ?Note Status: Signed Cosign: Cosign Not Required Encounter Date: 05/01/2021  ?Editor: Oretha Milch, MD (Physician)      ?       ?X budesonide nebs twice daily with brovana ?  ?  ? ? ?Orthostatic Vitals Recorded in This Encounter ? ? 05/01/2021  ?1001     ?BP Location: Left Arm  ? ?  Instructions ? ? ?Return in about 6 months (around 10/29/2021). ?X budesonide nebs twice daily with brovana  ?  ? ?

## 2021-07-25 NOTE — Telephone Encounter (Signed)
Called and spoke to patients wife. Sh voiced understanding about recs. Sent in prednisone to [facility]. Patients wife states he is not coughing any mucus up but if he starts an dits yellow she will call for z pak. Nothing further needed.  ?

## 2021-07-27 ENCOUNTER — Telehealth: Payer: Self-pay | Admitting: Pulmonary Disease

## 2021-07-27 MED ORDER — AZITHROMYCIN 250 MG PO TABS: ORAL_TABLET | ORAL | 0 refills | Status: AC

## 2021-07-27 NOTE — Telephone Encounter (Signed)
Wife states he is coughing up yellow mucus  ?Zpak sent in (ok per Dr. Vassie Loll from last phone note this week)  ? ?LVM for patient (ok per dpr) letting them know med was sent in nothing further needed ? ?

## 2021-08-03 ENCOUNTER — Telehealth: Payer: Self-pay | Admitting: Cardiology

## 2021-08-03 NOTE — Telephone Encounter (Signed)
Patient's wife is returning call to discuss lab results. 

## 2021-08-03 NOTE — Telephone Encounter (Signed)
Returned call, no answer.

## 2021-08-07 DIAGNOSIS — J441 Chronic obstructive pulmonary disease with (acute) exacerbation: Secondary | ICD-10-CM | POA: Diagnosis not present

## 2021-08-12 DIAGNOSIS — I5021 Acute systolic (congestive) heart failure: Secondary | ICD-10-CM | POA: Diagnosis not present

## 2021-08-12 DIAGNOSIS — J449 Chronic obstructive pulmonary disease, unspecified: Secondary | ICD-10-CM | POA: Diagnosis not present

## 2021-08-14 ENCOUNTER — Other Ambulatory Visit: Payer: Self-pay | Admitting: Cardiology

## 2021-08-16 ENCOUNTER — Telehealth: Payer: Self-pay | Admitting: Pulmonary Disease

## 2021-08-16 ENCOUNTER — Other Ambulatory Visit: Payer: Self-pay

## 2021-08-16 MED ORDER — ARFORMOTEROL TARTRATE 15 MCG/2ML IN NEBU: 15.0000 ug | INHALATION_SOLUTION | Freq: Two times a day (BID) | RESPIRATORY_TRACT | 3 refills | Status: DC

## 2021-08-16 NOTE — Telephone Encounter (Signed)
Called and notified wife that pts brovana was refilled.

## 2021-08-17 ENCOUNTER — Other Ambulatory Visit: Payer: Self-pay

## 2021-08-17 ENCOUNTER — Telehealth: Payer: Self-pay | Admitting: Pulmonary Disease

## 2021-08-17 NOTE — Telephone Encounter (Signed)
Called and spoke to [place] at [place] and he states that the medicine was sent in but requires a PA. Will send to PA team to see if this can be worked on.   Called and notified patient and he voiced understanding.   PA team please advise, can we get a PA done on this? Thanks!

## 2021-08-18 ENCOUNTER — Other Ambulatory Visit (HOSPITAL_COMMUNITY): Payer: Self-pay

## 2021-08-18 ENCOUNTER — Telehealth: Payer: Self-pay

## 2021-08-18 NOTE — Telephone Encounter (Signed)
Patient Advocate Encounter   Received notification from doctor's office that prior authorization for Arformoterol Tartrate 15MCG/2ML nebulizer solution is required.   PA submitted via phone ([phone]) on 08/18/2021 Key [id number]  Prior Authorization for Arformoterol Tartrate 15MCG/2ML nebulizer solution has been approved.    PA authorization # [id number] Effective dates: 08/18/2021 through 08/17/2022  Patients co-pay is $205.34

## 2021-09-04 DIAGNOSIS — N401 Enlarged prostate with lower urinary tract symptoms: Secondary | ICD-10-CM | POA: Diagnosis not present

## 2021-09-04 DIAGNOSIS — N138 Other obstructive and reflux uropathy: Secondary | ICD-10-CM | POA: Diagnosis not present

## 2021-09-04 DIAGNOSIS — R339 Retention of urine, unspecified: Secondary | ICD-10-CM | POA: Diagnosis not present

## 2021-09-11 ENCOUNTER — Other Ambulatory Visit: Payer: Self-pay | Admitting: Pulmonary Disease

## 2021-09-11 DIAGNOSIS — J449 Chronic obstructive pulmonary disease, unspecified: Secondary | ICD-10-CM

## 2021-09-12 DIAGNOSIS — J449 Chronic obstructive pulmonary disease, unspecified: Secondary | ICD-10-CM | POA: Diagnosis not present

## 2021-09-12 DIAGNOSIS — I5021 Acute systolic (congestive) heart failure: Secondary | ICD-10-CM | POA: Diagnosis not present

## 2021-09-15 ENCOUNTER — Other Ambulatory Visit: Payer: Self-pay | Admitting: Cardiology

## 2021-09-28 DIAGNOSIS — N401 Enlarged prostate with lower urinary tract symptoms: Secondary | ICD-10-CM | POA: Diagnosis not present

## 2021-09-28 DIAGNOSIS — R339 Retention of urine, unspecified: Secondary | ICD-10-CM | POA: Diagnosis not present

## 2021-09-28 DIAGNOSIS — N138 Other obstructive and reflux uropathy: Secondary | ICD-10-CM | POA: Diagnosis not present

## 2021-10-12 DIAGNOSIS — I5021 Acute systolic (congestive) heart failure: Secondary | ICD-10-CM | POA: Diagnosis not present

## 2021-10-12 DIAGNOSIS — J449 Chronic obstructive pulmonary disease, unspecified: Secondary | ICD-10-CM | POA: Diagnosis not present

## 2021-10-20 ENCOUNTER — Other Ambulatory Visit: Payer: Self-pay

## 2021-10-20 ENCOUNTER — Encounter: Payer: Self-pay | Admitting: Pulmonary Disease

## 2021-10-20 ENCOUNTER — Ambulatory Visit: Payer: Medicare HMO | Admitting: Pulmonary Disease

## 2021-10-20 ENCOUNTER — Ambulatory Visit (HOSPITAL_COMMUNITY)
Admission: RE | Admit: 2021-10-20 | Discharge: 2021-10-20 | Disposition: A | Discharge status: Home / Self Care | Payer: Medicare HMO | Source: Ambulatory Visit | Attending: Pulmonary Disease | Admitting: Pulmonary Disease

## 2021-10-20 VITALS — BP 124/70 | HR 68 | Temp 98.7°F | Ht 68.0 in | Wt 141.0 lb

## 2021-10-20 DIAGNOSIS — J439 Emphysema, unspecified: Secondary | ICD-10-CM | POA: Diagnosis not present

## 2021-10-20 DIAGNOSIS — J449 Chronic obstructive pulmonary disease, unspecified: Secondary | ICD-10-CM | POA: Insufficient documentation

## 2021-10-20 DIAGNOSIS — R1310 Dysphagia, unspecified: Secondary | ICD-10-CM

## 2021-10-20 MED ORDER — ALBUTEROL SULFATE (2.5 MG/3ML) 0.083% IN NEBU: 2.5000 mg | INHALATION_SOLUTION | Freq: Four times a day (QID) | RESPIRATORY_TRACT | 2 refills | Status: DC | PRN

## 2021-10-20 MED ORDER — ALBUTEROL SULFATE (2.5 MG/3ML) 0.083% IN NEBU
2.5000 mg | INHALATION_SOLUTION | Freq: Four times a day (QID) | RESPIRATORY_TRACT | 2 refills | Status: DC | PRN
Start: 2021-10-20 — End: 2021-10-20

## 2021-10-20 MED ORDER — BUDESONIDE 0.25 MG/2ML IN SUSP: 0.2500 mg | Freq: Every day | RESPIRATORY_TRACT | 12 refills | Status: DC

## 2021-10-20 MED ORDER — PREDNISONE 10 MG PO TABS: ORAL_TABLET | ORAL | 0 refills | Status: AC

## 2021-10-20 MED ORDER — BUDESONIDE 0.25 MG/2ML IN SUSP
0.2500 mg | Freq: Every day | RESPIRATORY_TRACT | 12 refills | Status: DC
Start: 2021-10-20 — End: 2021-10-20

## 2021-10-20 MED ORDER — ARFORMOTEROL TARTRATE 15 MCG/2ML IN NEBU: 15.0000 ug | INHALATION_SOLUTION | Freq: Two times a day (BID) | RESPIRATORY_TRACT | 3 refills | Status: DC

## 2021-10-20 MED ORDER — ARFORMOTEROL TARTRATE 15 MCG/2ML IN NEBU: 15.0000 ug | INHALATION_SOLUTION | Freq: Two times a day (BID) | RESPIRATORY_TRACT | 0 refills | Status: DC

## 2021-10-20 NOTE — Assessment & Plan Note (Signed)
He has a high risk for aspiration we will obtain a chest x-ray and treat with antibiotics if this shows infiltrates otherwise emphasized airway clearance with hypertonic saline nebs and Mucinex

## 2021-10-20 NOTE — Patient Instructions (Addendum)
   All nebs to be sent to DME Budesonide / Brovana & albuterol   Spacer x 1   Prednisone 10 mg tabs  Take 2 tabs daily with food x 5ds, then 1 tab daily with food x 5ds then STOP   X CXR to r/o pneumonia

## 2021-10-20 NOTE — Progress Notes (Signed)
   Subjective:    Patient ID: Devin Barton, male    DOB: 1945-08-02, 76 y.o.   MRN: [id number]  HPI  76 yo heavy ex-smoker for FU of COPD, on O2 during sleep -[name]  He smoked for 60 pack years until he quit in 2020   PMH -CAD status post CABG,HFrEF EF 25% recovered to 50% on echo 09/2018, - paroxysmal atrial fibrillation,  -chronic dysphagia >> dys 3 diet ,  -covid  infection 02/2020   -hospitalized 05/2020 for shortness of breath, fever and treated for aspiration pneumonia and COPD exacerbation     Meds - did not like trelegy or Breztri  Initial OV  >> Chronic cough may be related to [facility] or recurrent aspiration, Rx augmentin, trial of Breztri   Last OV 01/2021 >> simplify his regimen to- Budesonide 0.5 + Brovana nebs TWICE daily  Rx for Duonebs every 6h as needed only  Chief Complaint  Patient presents with   Follow-up    Wheezing and coughing more since last ov.    Accompanied by wife who corroborates history. His wheezing and coughing has been worse since last visit especially for the past few weeks. Brovana and budesonide are expensive more than $200 at pharmacy. He has been unable to obtain albuterol nebs or DuoNebs from the pharmacy due to poor availability He reports large amounts of white phlegm, no fevers or sick contacts He has chronic dysphagia denies obvious aspiration episode  Significant tests/ events reviewed  MBS 05/2020 moderate oropharyngeal dsyphagia ,significant pooling in the pyriforms and trace penetration during/after the swallow with thins. Dysphagia 3 (Mech soft) solids;Nectar thick liquid   09/2016 PFTs severe obstruction, ratio 36 , FEV1 0.75/25% improved to 0.98 with bronchodilator , FVC 51%   02/2017 CT chest right lower lobe groundglass opacities   Review of Systems neg for any significant sore throat, dysphagia, itching, sneezing, nasal congestion or excess/ purulent secretions, fever, chills, sweats, unintended wt loss, pleuritic  or exertional cp, hempoptysis, orthopnea pnd or change in chronic leg swelling. Also denies presyncope, palpitations, heartburn, abdominal pain, nausea, vomiting, diarrhea or change in bowel or urinary habits, dysuria,hematuria, rash, arthralgias, visual complaints, headache, numbness weakness or ataxia.     Objective:   Physical Exam  Gen. Pleasant, well-nourished, in no distress ENT - no thrush, no pallor/icterus,no post nasal drip, hoarse voice Neck: No JVD, no thyromegaly, no carotid bruits Lungs: no use of accessory muscles, no dullness to percussion, scattered rhonchi Cardiovascular: Rhythm regular, heart sounds  normal, no murmurs or gallops, no peripheral edema Musculoskeletal: No deformities, no cyanosis or clubbing        Assessment & Plan:

## 2021-10-20 NOTE — Assessment & Plan Note (Signed)
We will send in budesonide and Brovana nebs to DME rather than pharmacy hopefully this will decrease his cost. We will also send albuterol and DuoNebs to pharmacy and hopefully he will be able to obtain at this time. We will also provide him with a spacer so that he can use his albuterol MDI for rescue.  We will treat him for COPD exacerbation with a course of prednisone

## 2021-10-20 NOTE — Addendum Note (Signed)
Addended by: Carleene Mains D on: 10/20/2021 12:16 PM   Modules accepted: Orders

## 2021-10-21 ENCOUNTER — Other Ambulatory Visit: Payer: Self-pay | Admitting: Urology

## 2021-10-23 ENCOUNTER — Telehealth: Payer: Self-pay | Admitting: Pulmonary Disease

## 2021-10-23 MED ORDER — ALBUTEROL SULFATE (2.5 MG/3ML) 0.083% IN NEBU: 2.5000 mg | INHALATION_SOLUTION | Freq: Four times a day (QID) | RESPIRATORY_TRACT | 2 refills | Status: DC | PRN

## 2021-10-23 NOTE — Telephone Encounter (Signed)
Changed albuterol neb rx for 180 mL (60 doses) instead of 75 mL (25 doses). Nothing further needed at this time.

## 2021-10-24 DIAGNOSIS — N401 Enlarged prostate with lower urinary tract symptoms: Secondary | ICD-10-CM | POA: Diagnosis not present

## 2021-10-24 DIAGNOSIS — N138 Other obstructive and reflux uropathy: Secondary | ICD-10-CM | POA: Diagnosis not present

## 2021-10-24 DIAGNOSIS — R339 Retention of urine, unspecified: Secondary | ICD-10-CM | POA: Diagnosis not present

## 2021-11-12 ENCOUNTER — Other Ambulatory Visit: Payer: Self-pay | Admitting: Cardiology

## 2021-11-12 DIAGNOSIS — J449 Chronic obstructive pulmonary disease, unspecified: Secondary | ICD-10-CM | POA: Diagnosis not present

## 2021-11-12 DIAGNOSIS — I5021 Acute systolic (congestive) heart failure: Secondary | ICD-10-CM | POA: Diagnosis not present

## 2021-11-24 DIAGNOSIS — R339 Retention of urine, unspecified: Secondary | ICD-10-CM | POA: Diagnosis not present

## 2021-11-24 DIAGNOSIS — N401 Enlarged prostate with lower urinary tract symptoms: Secondary | ICD-10-CM | POA: Diagnosis not present

## 2021-11-24 DIAGNOSIS — N138 Other obstructive and reflux uropathy: Secondary | ICD-10-CM | POA: Diagnosis not present

## 2021-11-30 ENCOUNTER — Other Ambulatory Visit: Payer: Self-pay | Admitting: *Deleted

## 2021-11-30 MED ORDER — ATORVASTATIN CALCIUM 40 MG PO TABS: ORAL_TABLET | ORAL | 1 refills | Status: DC

## 2021-12-02 ENCOUNTER — Other Ambulatory Visit: Payer: Self-pay | Admitting: Cardiology

## 2021-12-11 ENCOUNTER — Telehealth: Payer: Self-pay | Admitting: Pulmonary Disease

## 2021-12-11 ENCOUNTER — Ambulatory Visit: Payer: Medicare HMO | Admitting: Urology

## 2021-12-11 MED ORDER — PREDNISONE 10 MG PO TABS: ORAL_TABLET | ORAL | 0 refills | Status: AC

## 2021-12-11 MED ORDER — AMOXICILLIN-POT CLAVULANATE 875-125 MG PO TABS: 1.0000 | ORAL_TABLET | Freq: Two times a day (BID) | ORAL | 0 refills | Status: AC

## 2021-12-11 NOTE — Telephone Encounter (Signed)
Called and spoke to patients wife Starla Link and she voiced understanding of recommendations and medication instructions. Nothing further needed at this time.

## 2021-12-11 NOTE — Telephone Encounter (Signed)
  Primary Pulmonologist: Dr. Elsworth Soho  Last office visit and with whom: 10/20/21 Dr. Elsworth Soho  What do we see them for (pulmonary problems): COPD  Last OV assessment/plan: see below   Was appointment offered to patient (explain)?  None available    Reason for call: Since last Thursday patient has some nasal and chest congestion, states he always coughs up sputum but it has darkened in color. no fevers. Sore throat and he has hoarseness to his voice. Wife thinks he is having a COPD flare up Would like something called into [facility].    Allergies  Allergen Reactions   Entresto [Sacubitril-Valsartan] Cough    Immunization History  Administered Date(s) Administered   Fluad Quad(high Dose 65+) 05/28/2020   Influenza Split 01/04/2021   Influenza, High Dose Seasonal PF 01/27/2018, 12/25/2018   Pneumococcal Polysaccharide-23 08/14/2017   Pneumococcal-Unspecified 05/28/2012   Tdap 12/20/2020   Zoster Recombinat (Shingrix) 12/20/2020     Assessment & Plan:           Assessment & Plan Note by Rigoberto Noel, MD at 10/20/2021 11:57 AM  Author: Rigoberto Noel, MD Author Type: Physician Filed: 10/20/2021 11:57 AM  Note Status: Written Cosign: Cosign Not Required Encounter Date: 10/20/2021  Problem: Dysphagia  Editor: Rigoberto Noel, MD (Physician)             He has a high risk for aspiration we will obtain a chest x-ray and treat with antibiotics if this shows infiltrates otherwise emphasized airway clearance with hypertonic saline nebs and Mucinex        Assessment & Plan Note by Rigoberto Noel, MD at 10/20/2021 11:56 AM  Author: Rigoberto Noel, MD Author Type: Physician Filed: 10/20/2021 11:56 AM  Note Status: Written Cosign: Cosign Not Required Encounter Date: 10/20/2021  Problem: COPD with chronic bronchitis and emphysema ([place])  Editor: Rigoberto Noel, MD (Physician)             We will send in budesonide and Brovana nebs to DME rather than pharmacy hopefully this will decrease his  cost. We will also send albuterol and DuoNebs to pharmacy and hopefully he will be able to obtain at this time. We will also provide him with a spacer so that he can use his albuterol MDI for rescue.   We will treat him for COPD exacerbation with a course of prednisone        Patient Instructions by Rigoberto Noel, MD at 10/20/2021 11:30 AM  Author: Rigoberto Noel, MD Author Type: Physician Filed: 10/20/2021 11:39 AM  Note Status: Addendum Cosign: Cosign Not Required Encounter Date: 10/20/2021  Editor: Rigoberto Noel, MD (Physician)      Prior Versions: 1. Rigoberto Noel, MD (Physician) at 10/20/2021 11:38 AM - Signed      All nebs to be sent to DME Budesonide / [place] & albuterol    Spacer x 1     Prednisone 10 mg tabs  Take 2 tabs daily with food x 5ds, then 1 tab daily with food x 5ds then STOP     X CXR to r/o pneumonia

## 2021-12-13 ENCOUNTER — Encounter: Payer: Self-pay | Admitting: Urology

## 2021-12-13 ENCOUNTER — Ambulatory Visit: Payer: Medicare HMO | Admitting: Urology

## 2021-12-13 VITALS — BP 111/71 | HR 76

## 2021-12-13 DIAGNOSIS — I5021 Acute systolic (congestive) heart failure: Secondary | ICD-10-CM | POA: Diagnosis not present

## 2021-12-13 DIAGNOSIS — N138 Other obstructive and reflux uropathy: Secondary | ICD-10-CM

## 2021-12-13 DIAGNOSIS — R339 Retention of urine, unspecified: Secondary | ICD-10-CM | POA: Diagnosis not present

## 2021-12-13 DIAGNOSIS — N401 Enlarged prostate with lower urinary tract symptoms: Secondary | ICD-10-CM | POA: Diagnosis not present

## 2021-12-13 DIAGNOSIS — J449 Chronic obstructive pulmonary disease, unspecified: Secondary | ICD-10-CM | POA: Diagnosis not present
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
## 2021-12-13 NOTE — Patient Instructions (Signed)
Acute Urinary Retention, Male  Acute urinary retention is when a person cannot pee (urinate) at all, or can only pee a little. This can come on all of a sudden. If it is not treated, it can lead to kidney problems or other serious problems. What are the causes? A problem with the tube that drains the bladder (urethra). Problems with the nerves in the bladder. Tumors. Certain medicines. An infection. Having trouble pooping (constipation). What increases the risk? Older men are more at risk because their prostate gland may become larger as they age. Other conditions also can increase risk. These include: Diseases, such as multiple sclerosis. Injury to the spinal cord. Diabetes. A condition that affects the way the brain works, such as dementia. Holding back urine due to trauma or because you do not want to use the bathroom. What are the signs or symptoms? Trouble peeing. Pain in the lower belly. How is this treated? Treatment for this condition may include: Medicines. Placing a thin, germ-free tube (catheter) into the bladder to drain pee out of the body. Therapy to treat mental health conditions. Treatment for conditions that may cause this. If needed, you may be treated in the hospital for kidney problems or to manage other problems. Follow these instructions at home: Medicines Take over-the-counter and prescription medicines only as told by your doctor. Ask your doctor what medicines you should stay away from. If you were given an antibiotic medicine, take it as told by your doctor. Do not stop taking it, even if you start to feel better. General instructions Do not smoke or use any products that contain nicotine or tobacco. If you need help quitting, ask your doctor. Drink enough fluid to keep your pee pale yellow. If you were sent home with a tube that drains the bladder, take care of it as told by your doctor. Watch for changes in your symptoms. Tell your doctor about them. If  told, keep track of changes in your blood pressure at home. Tell your doctor about them. Keep all follow-up visits. Contact a doctor if: You have spasms in your bladder that you cannot stop. You leak pee when you have spasms. Get help right away if: You have chills or a fever. You have blood in your pee. You have a tube that drains pee from the bladder and these things happen: The tube stops draining pee. The tube falls out. Summary Acute urinary retention is when you cannot pee at all or you pee too little. If this condition is not treated, it can lead to kidney problems or other serious problems. If you were sent home with a tube (catheter) that drains the bladder, take care of it as told by your doctor. Watch for changes in your symptoms. Tell your doctor about them. This information is not intended to replace advice given to you by your health care provider. Make sure you discuss any questions you have with your health care provider. Document Revised: 12/02/2019 Document Reviewed: 12/02/2019 Elsevier Patient Education  2023 [employer]  

## 2021-12-14 LAB — URINALYSIS, ROUTINE W REFLEX MICROSCOPIC
Bilirubin, UA: NEGATIVE
Glucose, UA: NEGATIVE
Ketones, UA: NEGATIVE
Leukocytes,UA: NEGATIVE
Nitrite, UA: NEGATIVE
Protein,UA: NEGATIVE
RBC, UA: NEGATIVE
Specific Gravity, UA: 1.01 (ref 1.005–1.030)
Urobilinogen, Ur: 0.2 mg/dL (ref 0.2–1.0)
pH, UA: 6.5 (ref 5.0–7.5)

## 2021-12-28 DIAGNOSIS — N401 Enlarged prostate with lower urinary tract symptoms: Secondary | ICD-10-CM | POA: Diagnosis not present

## 2021-12-28 DIAGNOSIS — R339 Retention of urine, unspecified: Secondary | ICD-10-CM | POA: Diagnosis not present

## 2022-01-12 DIAGNOSIS — J449 Chronic obstructive pulmonary disease, unspecified: Secondary | ICD-10-CM | POA: Diagnosis not present

## 2022-01-12 DIAGNOSIS — I5021 Acute systolic (congestive) heart failure: Secondary | ICD-10-CM | POA: Diagnosis not present

## 2022-01-13 ENCOUNTER — Other Ambulatory Visit: Payer: Self-pay | Admitting: Urology

## 2022-01-25 DIAGNOSIS — R339 Retention of urine, unspecified: Secondary | ICD-10-CM | POA: Diagnosis not present

## 2022-01-25 DIAGNOSIS — N138 Other obstructive and reflux uropathy: Secondary | ICD-10-CM | POA: Diagnosis not present

## 2022-01-25 DIAGNOSIS — N401 Enlarged prostate with lower urinary tract symptoms: Secondary | ICD-10-CM | POA: Diagnosis not present

## 2022-01-30 ENCOUNTER — Encounter: Payer: Self-pay | Admitting: Cardiology

## 2022-01-30 ENCOUNTER — Ambulatory Visit: Payer: Medicare HMO | Attending: Cardiology | Admitting: Cardiology

## 2022-01-30 VITALS — BP 120/64 | HR 64 | Ht 68.0 in | Wt 140.4 lb

## 2022-01-30 DIAGNOSIS — I6523 Occlusion and stenosis of bilateral carotid arteries: Secondary | ICD-10-CM | POA: Diagnosis not present

## 2022-01-30 DIAGNOSIS — I5022 Chronic systolic (congestive) heart failure: Secondary | ICD-10-CM | POA: Diagnosis not present

## 2022-01-30 DIAGNOSIS — I2510 Atherosclerotic heart disease of native coronary artery without angina pectoris: Secondary | ICD-10-CM | POA: Diagnosis not present

## 2022-01-30 DIAGNOSIS — E782 Mixed hyperlipidemia: Secondary | ICD-10-CM | POA: Diagnosis not present

## 2022-01-30 NOTE — Patient Instructions (Addendum)
Medication Instructions:  Your physician recommends that you continue on your current medications as directed. Please refer to the Current Medication list given to you today.  Labwork: none  Testing/Procedures: Your physician has requested that you have a carotid duplex. This test is an ultrasound of the carotid arteries in your neck. It looks at blood flow through these arteries that supply the brain with blood. Allow one hour for this exam. There are no restrictions or special instructions.  Follow-Up: Your physician recommends that you schedule a follow-up appointment in: 6 months  Any Other Special Instructions Will Be Listed Below (If Applicable).  If you need a refill on your cardiac medications before your next appointment, please call your pharmacy. 
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
## 2022-02-01 ENCOUNTER — Ambulatory Visit: Payer: Medicare HMO | Attending: Cardiology

## 2022-02-01 DIAGNOSIS — I6523 Occlusion and stenosis of bilateral carotid arteries: Secondary | ICD-10-CM | POA: Diagnosis not present

## 2022-02-12 DIAGNOSIS — I5021 Acute systolic (congestive) heart failure: Secondary | ICD-10-CM | POA: Diagnosis not present

## 2022-02-12 DIAGNOSIS — J449 Chronic obstructive pulmonary disease, unspecified: Secondary | ICD-10-CM | POA: Diagnosis not present

## 2022-02-13 ENCOUNTER — Telehealth: Payer: Self-pay

## 2022-02-13 NOTE — Telephone Encounter (Signed)
Patient notified and verbalized understanding. Patient had no questions or concerns at this time. PCP copied 

## 2022-02-13 NOTE — Telephone Encounter (Signed)
-----   Message from Barnie Mort sent at 02/13/2022 11:14 AM EST ----- Inocencio Homes is out of the office this week.  Please try and contact the patient again to give results. Thanks,  Lowella Bandy ----- Message ----- From: Antoine Poche, MD Sent: 02/06/2022   2:29 PM EST To: Lesle Chris, LPN  Mild blockae on the right and moderate on the left, just somehting to monitor at this time  Dominga Ferry MD

## 2022-02-23 DIAGNOSIS — N401 Enlarged prostate with lower urinary tract symptoms: Secondary | ICD-10-CM | POA: Diagnosis not present

## 2022-02-23 DIAGNOSIS — N138 Other obstructive and reflux uropathy: Secondary | ICD-10-CM | POA: Diagnosis not present

## 2022-02-23 DIAGNOSIS — R339 Retention of urine, unspecified: Secondary | ICD-10-CM | POA: Diagnosis not present

## 2022-02-24 ENCOUNTER — Other Ambulatory Visit: Payer: Self-pay | Admitting: Cardiology

## 2022-03-05 DIAGNOSIS — Z1331 Encounter for screening for depression: Secondary | ICD-10-CM | POA: Diagnosis not present

## 2022-03-05 DIAGNOSIS — E559 Vitamin D deficiency, unspecified: Secondary | ICD-10-CM | POA: Diagnosis not present

## 2022-03-05 DIAGNOSIS — Z0001 Encounter for general adult medical examination with abnormal findings: Secondary | ICD-10-CM | POA: Diagnosis not present

## 2022-03-05 DIAGNOSIS — Z681 Body mass index (BMI) 19 or less, adult: Secondary | ICD-10-CM | POA: Diagnosis not present

## 2022-03-05 DIAGNOSIS — E039 Hypothyroidism, unspecified: Secondary | ICD-10-CM | POA: Diagnosis not present

## 2022-03-05 DIAGNOSIS — D518 Other vitamin B12 deficiency anemias: Secondary | ICD-10-CM | POA: Diagnosis not present

## 2022-03-06 ENCOUNTER — Encounter: Payer: Self-pay | Admitting: Pulmonary Disease

## 2022-03-06 ENCOUNTER — Ambulatory Visit: Payer: Medicare HMO | Admitting: Pulmonary Disease

## 2022-03-06 VITALS — BP 102/62 | HR 60 | Ht 65.0 in | Wt 141.3 lb

## 2022-03-06 DIAGNOSIS — J439 Emphysema, unspecified: Secondary | ICD-10-CM

## 2022-03-06 DIAGNOSIS — Z23 Encounter for immunization: Secondary | ICD-10-CM | POA: Diagnosis not present

## 2022-03-06 DIAGNOSIS — J4489 Other specified chronic obstructive pulmonary disease: Secondary | ICD-10-CM

## 2022-03-06 DIAGNOSIS — J9611 Chronic respiratory failure with hypoxia: Secondary | ICD-10-CM | POA: Diagnosis not present

## 2022-03-06 MED ORDER — IPRATROPIUM BROMIDE 0.02 % IN SOLN
0.5000 mg | Freq: Three times a day (TID) | RESPIRATORY_TRACT | 2 refills | Status: DC | PRN
Start: 2022-03-06 — End: 2023-09-23

## 2022-03-06 MED ORDER — ALBUTEROL SULFATE (2.5 MG/3ML) 0.083% IN NEBU: 2.5000 mg | INHALATION_SOLUTION | Freq: Three times a day (TID) | RESPIRATORY_TRACT | 2 refills | Status: DC | PRN

## 2022-03-06 NOTE — Patient Instructions (Addendum)
X refills on albuterol/ atrovent nebs every 8h -  OK to dc Brovana  Continue on budesonide twice daily  X flu shot

## 2022-03-06 NOTE — Assessment & Plan Note (Addendum)
Continue oxygen during sleep ?

## 2022-03-06 NOTE — Assessment & Plan Note (Signed)
Due to cost, we will discontinue Brovana. Will keep him on a regimen of budesonide and DuoNebs  X refills on albuterol/ atrovent nebs every 8h -  X flu shot

## 2022-03-06 NOTE — Progress Notes (Signed)
   Subjective:    Patient ID: Devin Barton, male    DOB: [birth year], 76 y.o.   MRN: [id number]  HPI  76 yo heavy ex-smoker for FU of COPD, on O2 during sleep -chronic dysphagia  -wife Natasha Mead  He smoked for 60 pack years until he quit in 2020   PMH -CAD status post CABG,HFrEF EF 25% recovered to 50% on echo 09/2018, - paroxysmal atrial fibrillation,  -chronic dysphagia >> dys 3 diet ,  -covid  infection 02/2020   -hospitalized 05/2020 for shortness of breath, fever and treated for aspiration pneumonia and COPD exacerbation   Meds - did not like trelegy or Breztri -Brovana too expensive   Initial OV  >> Chronic cough may be related to [facility] or recurrent aspiration, Rx augmentin, trial of Breztri     Chief Complaint  Patient presents with   Follow-up    Pt f/u he states he is doing well, using 1.5L/m O2 at night.    76-month follow-up visit, accompanied by his wife Nadene Rubins 01/2021 >> simplify his regimen to- Budesonide 0.5 + Brovana nebs TWICE daily  Rx for Duonebs every 6h as needed only  However Rosalyn Gess is very expensive about $195 and he only uses this intermittently 11/2021 he called for cough yellow sputum and was given Augmentin and prednisone and this worked really well  Breathing is now back to baseline, he reports intermittent cough     Significant tests/ events reviewed  MBS 05/2020 moderate oropharyngeal dsyphagia ,significant pooling in the pyriforms and trace penetration during/after the swallow with thins. Dysphagia 3 (Mech soft) solids;Nectar thick liquid   09/2016 PFTs severe obstruction, ratio 36 , FEV1 0.75/25% improved to 0.98 with bronchodilator , FVC 51%   02/2017 CT chest right lower lobe groundglass opacities  Review of Systems neg for any significant sore throat, dysphagia, itching, sneezing, nasal congestion or excess/ purulent secretions, fever, chills, sweats, unintended wt loss, pleuritic or exertional cp, hempoptysis, orthopnea pnd or change  in chronic leg swelling. Also denies presyncope, palpitations, heartburn, abdominal pain, nausea, vomiting, diarrhea or change in bowel or urinary habits, dysuria,hematuria, rash, arthralgias, visual complaints, headache, numbness weakness or ataxia.      Objective:   Physical Exam  Gen. Pleasant, well-nourished, elderly,in no distress ENT - no thrush, no pallor/icterus,no post nasal drip Neck: No JVD, no thyromegaly, no carotid bruits Lungs: no use of accessory muscles, no dullness to percussion, clear without rales or rhonchi  Cardiovascular: Rhythm regular, heart sounds  normal, no murmurs or gallops, no peripheral edema Musculoskeletal: No deformities, no cyanosis or clubbing        Assessment & Plan:

## 2022-03-14 DIAGNOSIS — J449 Chronic obstructive pulmonary disease, unspecified: Secondary | ICD-10-CM | POA: Diagnosis not present

## 2022-03-14 DIAGNOSIS — I5021 Acute systolic (congestive) heart failure: Secondary | ICD-10-CM | POA: Diagnosis not present

## 2022-03-17 ENCOUNTER — Other Ambulatory Visit: Payer: Self-pay | Admitting: Cardiology

## 2022-04-09 ENCOUNTER — Other Ambulatory Visit: Payer: Self-pay | Admitting: Cardiology

## 2022-04-09 DIAGNOSIS — I6523 Occlusion and stenosis of bilateral carotid arteries: Secondary | ICD-10-CM

## 2022-04-14 DIAGNOSIS — I5021 Acute systolic (congestive) heart failure: Secondary | ICD-10-CM | POA: Diagnosis not present

## 2022-04-14 DIAGNOSIS — J449 Chronic obstructive pulmonary disease, unspecified: Secondary | ICD-10-CM | POA: Diagnosis not present

## 2022-04-30 ENCOUNTER — Other Ambulatory Visit: Payer: Self-pay | Admitting: Cardiology

## 2022-05-06 ENCOUNTER — Other Ambulatory Visit: Payer: Self-pay | Admitting: Urology

## 2022-05-15 DIAGNOSIS — J449 Chronic obstructive pulmonary disease, unspecified: Secondary | ICD-10-CM | POA: Diagnosis not present

## 2022-05-15 DIAGNOSIS — I5021 Acute systolic (congestive) heart failure: Secondary | ICD-10-CM | POA: Diagnosis not present

## 2022-05-26 DIAGNOSIS — R339 Retention of urine, unspecified: Secondary | ICD-10-CM | POA: Diagnosis not present

## 2022-05-26 DIAGNOSIS — N138 Other obstructive and reflux uropathy: Secondary | ICD-10-CM | POA: Diagnosis not present

## 2022-05-26 DIAGNOSIS — N401 Enlarged prostate with lower urinary tract symptoms: Secondary | ICD-10-CM | POA: Diagnosis not present

## 2022-06-13 DIAGNOSIS — J449 Chronic obstructive pulmonary disease, unspecified: Secondary | ICD-10-CM | POA: Diagnosis not present

## 2022-06-13 DIAGNOSIS — I5021 Acute systolic (congestive) heart failure: Secondary | ICD-10-CM | POA: Diagnosis not present

## 2022-06-26 DIAGNOSIS — N401 Enlarged prostate with lower urinary tract symptoms: Secondary | ICD-10-CM | POA: Diagnosis not present

## 2022-06-26 DIAGNOSIS — R339 Retention of urine, unspecified: Secondary | ICD-10-CM | POA: Diagnosis not present

## 2022-07-11 ENCOUNTER — Ambulatory Visit: Payer: Medicare HMO | Admitting: Pulmonary Disease

## 2022-07-14 DIAGNOSIS — J449 Chronic obstructive pulmonary disease, unspecified: Secondary | ICD-10-CM | POA: Diagnosis not present

## 2022-07-14 DIAGNOSIS — I5021 Acute systolic (congestive) heart failure: Secondary | ICD-10-CM | POA: Diagnosis not present

## 2022-07-15 ENCOUNTER — Other Ambulatory Visit: Payer: Self-pay | Admitting: Cardiology

## 2022-07-16 DIAGNOSIS — J441 Chronic obstructive pulmonary disease with (acute) exacerbation: Secondary | ICD-10-CM | POA: Diagnosis not present

## 2022-07-26 ENCOUNTER — Ambulatory Visit (HOSPITAL_COMMUNITY)
Admission: RE | Admit: 2022-07-26 | Discharge: 2022-07-26 | Disposition: A | Discharge status: Home / Self Care | Payer: Medicare HMO | Source: Ambulatory Visit | Attending: Pulmonary Disease | Admitting: Pulmonary Disease

## 2022-07-26 ENCOUNTER — Encounter: Payer: Self-pay | Admitting: Pulmonary Disease

## 2022-07-26 ENCOUNTER — Ambulatory Visit: Payer: Medicare HMO | Admitting: Pulmonary Disease

## 2022-07-26 VITALS — BP 98/60 | HR 79 | Ht 68.0 in | Wt 137.2 lb

## 2022-07-26 DIAGNOSIS — R339 Retention of urine, unspecified: Secondary | ICD-10-CM | POA: Diagnosis not present

## 2022-07-26 DIAGNOSIS — J4489 Other specified chronic obstructive pulmonary disease: Secondary | ICD-10-CM | POA: Diagnosis not present

## 2022-07-26 DIAGNOSIS — J439 Emphysema, unspecified: Secondary | ICD-10-CM

## 2022-07-26 DIAGNOSIS — R059 Cough, unspecified: Secondary | ICD-10-CM | POA: Diagnosis not present

## 2022-07-26 DIAGNOSIS — N401 Enlarged prostate with lower urinary tract symptoms: Secondary | ICD-10-CM | POA: Diagnosis not present

## 2022-07-26 DIAGNOSIS — N138 Other obstructive and reflux uropathy: Secondary | ICD-10-CM | POA: Diagnosis not present

## 2022-07-26 DIAGNOSIS — J449 Chronic obstructive pulmonary disease, unspecified: Secondary | ICD-10-CM | POA: Diagnosis not present

## 2022-07-26 MED ORDER — PREDNISONE 10 MG PO TABS: ORAL_TABLET | ORAL | 0 refills | Status: AC

## 2022-07-26 MED ORDER — CEFDINIR 300 MG PO CAPS: 300.0000 mg | ORAL_CAPSULE | Freq: Two times a day (BID) | ORAL | 0 refills | Status: DC

## 2022-07-26 MED ORDER — SODIUM CHLORIDE 3 % IN NEBU: INHALATION_SOLUTION | Freq: Two times a day (BID) | RESPIRATORY_TRACT | 0 refills | Status: DC

## 2022-07-26 NOTE — Patient Instructions (Signed)
X Prednisone 10 mg tabs  Take 2 tabs daily with food x 5ds, then 1 tab daily with food x 5ds then STOP  Use Mucinex 600 mg twice daily and flutter valve for airway clearance  X hypertonic saline nebs twice daily for 5 days  x Omnicef 300 mg twice daily for 7 days  X chest x-ray today

## 2022-07-26 NOTE — Progress Notes (Signed)
   Subjective:    Patient ID: Devin Barton, male    DOB: Apr 02, 1945, 77 y.o.   MRN: [id number]  HPI  77 yo heavy ex-smoker for FU of COPD, on O2 during sleep -chronic dysphagia & recurrent aspiration -wife Natasha Mead  He smoked for 60 pack years until he quit in 2020   PMH -CAD status post CABG,HFrEF EF 25% recovered to 50% on echo 09/2018, - paroxysmal atrial fibrillation,  -chronic dysphagia >> dys 3 diet ,  -covid  infection 02/2020   -hospitalized 05/2020 for shortness of breath, fever and treated for aspiration pneumonia and COPD exacerbation   Meds - did not like trelegy or Leeroy Bock too expensive   Chief Complaint  Patient presents with  . Follow-up    Pt f/u states that he went to the ED on 4/22 and has continuous congestion since then. Currently has a productive cough w/ thick clear mucus   Brovana was very expensive so he had settled down with a regimen of budesonide and albuterol/Atrovent nebs.  He has done well for the past year but he had onset of cough with sputum production that required urgent care visit on 4/22.  He was wheezing given nebs, Depo-Medrol 80 mg IM and prescription for Augmentin and prednisone.  He passed this for a week but is still coughing.  He has a wet sounding cough today.  No sick contacts, no fevers  Significant tests/ events reviewed MBS 05/2020 moderate oropharyngeal dsyphagia ,significant pooling in the pyriforms and trace penetration during/after the swallow with thins. Dysphagia 3 (Mech soft) solids;Nectar thick liquid   09/2016 PFTs severe obstruction, ratio 36 , FEV1 0.75/25% improved to 0.98 with bronchodilator , FVC 51%   02/2017 CT chest right lower lobe groundglass opacities  Review of Systems neg for any significant sore throat, dysphagia, itching, sneezing, nasal congestion or excess/ purulent secretions, fever, chills, sweats, unintended wt loss, pleuritic or exertional cp, hempoptysis, orthopnea pnd or change in chronic leg  swelling. Also denies presyncope, palpitations, heartburn, abdominal pain, nausea, vomiting, diarrhea or change in bowel or urinary habits, dysuria,hematuria, rash, arthralgias, visual complaints, headache, numbness weakness or ataxia.     Objective:   Physical Exam  Gen. Pleasant, well-nourished, in no distress ENT - no thrush, no pallor/icterus,no post nasal drip,edentulous Neck: No JVD, no thyromegaly, no carotid bruits Lungs: no use of accessory muscles, no dullness to percussion, clear without rales or rhonchi  Cardiovascular: Rhythm regular, heart sounds  normal, no murmurs or gallops, no peripheral edema Musculoskeletal: No deformities, no cyanosis or clubbing        Assessment & Plan:

## 2022-07-26 NOTE — Assessment & Plan Note (Signed)
He seems to be having an exacerbation not sure if this is related to allergies or another episode of aspiration.  Will treat him aggressively since he has a high risk for hospitalization He has completed a course of Augmentin and prednisone and he normally responds to this treatment but this time seems to be refractory  X Prednisone 10 mg tabs  Take 2 tabs daily with food x 5ds, then 1 tab daily with food x 5ds then STOP  Use Mucinex 600 mg twice daily and flutter valve for airway clearance hypertonic saline nebs twice daily for 5 days  x Omnicef 300 mg twice daily for 7 days  X chest x-ray today

## 2022-08-06 ENCOUNTER — Ambulatory Visit: Payer: Medicare HMO | Attending: Cardiology | Admitting: Cardiology

## 2022-08-06 ENCOUNTER — Encounter: Payer: Self-pay | Admitting: Cardiology

## 2022-08-06 VITALS — BP 118/50 | HR 74 | Ht 68.0 in | Wt 140.6 lb

## 2022-08-06 DIAGNOSIS — I2510 Atherosclerotic heart disease of native coronary artery without angina pectoris: Secondary | ICD-10-CM | POA: Diagnosis not present

## 2022-08-06 DIAGNOSIS — I6523 Occlusion and stenosis of bilateral carotid arteries: Secondary | ICD-10-CM

## 2022-08-06 DIAGNOSIS — I5020 Unspecified systolic (congestive) heart failure: Secondary | ICD-10-CM

## 2022-08-06 DIAGNOSIS — I5032 Chronic diastolic (congestive) heart failure: Secondary | ICD-10-CM | POA: Diagnosis not present

## 2022-08-06 DIAGNOSIS — E782 Mixed hyperlipidemia: Secondary | ICD-10-CM

## 2022-08-06 NOTE — Patient Instructions (Signed)
Medication Instructions:  Continue all current medications.   Labwork: none  Testing/Procedures: none  Follow-Up: 6 months   Any Other Special Instructions Will Be Listed Below (If Applicable).   If you need a refill on your cardiac medications before your next appointment, please call your pharmacy.  
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
## 2022-08-13 DIAGNOSIS — I5021 Acute systolic (congestive) heart failure: Secondary | ICD-10-CM | POA: Diagnosis not present

## 2022-08-13 DIAGNOSIS — J449 Chronic obstructive pulmonary disease, unspecified: Secondary | ICD-10-CM | POA: Diagnosis not present

## 2022-08-19 ENCOUNTER — Other Ambulatory Visit: Payer: Self-pay | Admitting: Urology

## 2022-08-29 DIAGNOSIS — N138 Other obstructive and reflux uropathy: Secondary | ICD-10-CM | POA: Diagnosis not present

## 2022-08-29 DIAGNOSIS — R339 Retention of urine, unspecified: Secondary | ICD-10-CM | POA: Diagnosis not present

## 2022-08-29 DIAGNOSIS — N401 Enlarged prostate with lower urinary tract symptoms: Secondary | ICD-10-CM | POA: Diagnosis not present

## 2022-09-13 DIAGNOSIS — J449 Chronic obstructive pulmonary disease, unspecified: Secondary | ICD-10-CM | POA: Diagnosis not present

## 2022-09-13 DIAGNOSIS — I5021 Acute systolic (congestive) heart failure: Secondary | ICD-10-CM | POA: Diagnosis not present

## 2022-09-26 DIAGNOSIS — R339 Retention of urine, unspecified: Secondary | ICD-10-CM | POA: Diagnosis not present

## 2022-09-26 DIAGNOSIS — N401 Enlarged prostate with lower urinary tract symptoms: Secondary | ICD-10-CM | POA: Diagnosis not present

## 2022-09-26 DIAGNOSIS — N138 Other obstructive and reflux uropathy: Secondary | ICD-10-CM | POA: Diagnosis not present

## 2022-10-13 DIAGNOSIS — J449 Chronic obstructive pulmonary disease, unspecified: Secondary | ICD-10-CM | POA: Diagnosis not present

## 2022-10-13 DIAGNOSIS — I5021 Acute systolic (congestive) heart failure: Secondary | ICD-10-CM | POA: Diagnosis not present

## 2022-10-25 DIAGNOSIS — N401 Enlarged prostate with lower urinary tract symptoms: Secondary | ICD-10-CM | POA: Diagnosis not present

## 2022-10-25 DIAGNOSIS — N138 Other obstructive and reflux uropathy: Secondary | ICD-10-CM | POA: Diagnosis not present

## 2022-10-25 DIAGNOSIS — R339 Retention of urine, unspecified: Secondary | ICD-10-CM | POA: Diagnosis not present

## 2022-10-28 ENCOUNTER — Other Ambulatory Visit: Payer: Self-pay | Admitting: Cardiology

## 2022-11-13 DIAGNOSIS — J449 Chronic obstructive pulmonary disease, unspecified: Secondary | ICD-10-CM | POA: Diagnosis not present

## 2022-11-13 DIAGNOSIS — I5021 Acute systolic (congestive) heart failure: Secondary | ICD-10-CM | POA: Diagnosis not present

## 2022-11-18 ENCOUNTER — Other Ambulatory Visit: Payer: Self-pay | Admitting: Urology

## 2022-11-25 ENCOUNTER — Other Ambulatory Visit: Payer: Self-pay | Admitting: Cardiology

## 2022-11-28 DIAGNOSIS — R339 Retention of urine, unspecified: Secondary | ICD-10-CM | POA: Diagnosis not present

## 2022-11-28 DIAGNOSIS — N401 Enlarged prostate with lower urinary tract symptoms: Secondary | ICD-10-CM | POA: Diagnosis not present

## 2022-12-12 ENCOUNTER — Encounter: Payer: Self-pay | Admitting: Urology

## 2022-12-12 ENCOUNTER — Ambulatory Visit: Payer: Medicare HMO | Admitting: Urology

## 2022-12-12 VITALS — BP 118/57 | HR 75

## 2022-12-12 DIAGNOSIS — R339 Retention of urine, unspecified: Secondary | ICD-10-CM | POA: Diagnosis not present

## 2022-12-12 DIAGNOSIS — N401 Enlarged prostate with lower urinary tract symptoms: Secondary | ICD-10-CM

## 2022-12-12 DIAGNOSIS — N138 Other obstructive and reflux uropathy: Secondary | ICD-10-CM

## 2022-12-12 LAB — URINALYSIS, ROUTINE W REFLEX MICROSCOPIC
Bilirubin, UA: NEGATIVE
Glucose, UA: NEGATIVE
Ketones, UA: NEGATIVE
Nitrite, UA: NEGATIVE
Protein,UA: NEGATIVE
RBC, UA: NEGATIVE
Specific Gravity, UA: 1.01 (ref 1.005–1.030)
Urobilinogen, Ur: 0.2 mg/dL (ref 0.2–1.0)
pH, UA: 6.5 (ref 5.0–7.5)

## 2022-12-12 LAB — MICROSCOPIC EXAMINATION
Bacteria, UA: NONE SEEN
RBC, Urine: NONE SEEN /HPF (ref 0–2)
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
## 2022-12-12 NOTE — Patient Instructions (Signed)

## 2022-12-14 DIAGNOSIS — I5021 Acute systolic (congestive) heart failure: Secondary | ICD-10-CM | POA: Diagnosis not present

## 2022-12-14 DIAGNOSIS — J449 Chronic obstructive pulmonary disease, unspecified: Secondary | ICD-10-CM | POA: Diagnosis not present

## 2022-12-26 DIAGNOSIS — R339 Retention of urine, unspecified: Secondary | ICD-10-CM | POA: Diagnosis not present

## 2022-12-26 DIAGNOSIS — N401 Enlarged prostate with lower urinary tract symptoms: Secondary | ICD-10-CM | POA: Diagnosis not present

## 2022-12-26 DIAGNOSIS — N138 Other obstructive and reflux uropathy: Secondary | ICD-10-CM | POA: Diagnosis not present

## 2022-12-27 ENCOUNTER — Encounter: Payer: Self-pay | Admitting: Adult Health

## 2022-12-27 ENCOUNTER — Ambulatory Visit: Payer: Medicare HMO | Admitting: Adult Health

## 2022-12-27 VITALS — BP 109/68 | HR 90 | Ht 68.0 in | Wt 144.0 lb

## 2022-12-27 DIAGNOSIS — J4489 Other specified chronic obstructive pulmonary disease: Secondary | ICD-10-CM

## 2022-12-27 DIAGNOSIS — J9611 Chronic respiratory failure with hypoxia: Secondary | ICD-10-CM | POA: Diagnosis not present

## 2022-12-27 DIAGNOSIS — R1310 Dysphagia, unspecified: Secondary | ICD-10-CM

## 2022-12-27 DIAGNOSIS — J439 Emphysema, unspecified: Secondary | ICD-10-CM | POA: Diagnosis not present

## 2022-12-27 NOTE — Assessment & Plan Note (Signed)
Continue on oxygen 1.5 L at bedtime

## 2022-12-27 NOTE — Assessment & Plan Note (Signed)
Patient has chronic dysphagia and high risk for aspiration.  Continue with aspiration precautions.

## 2022-12-27 NOTE — Patient Instructions (Addendum)
Continue on Budesonide neb Twice daily   Continue on Ipratropium and Albuterol neb 1-2 times a day  Albuterol inhaler or neb As needed   Activity as tolerated RSV vaccine this fall as discussed  Aspiration precautions as discussed  Claritin 10mg  At bedtime  As needed   Continue on Oxygen 1.5l/m At bedtime   Follow up with Dr. Sherene Sires  in 4 months and As needed  -New patient

## 2022-12-27 NOTE — Assessment & Plan Note (Signed)
Very severe COPD-currently appears to be stable.  Patient has had difficulty tolerating pulmonary medications.  Currently using budesonide nebulizer twice daily.  And is using DuoNeb 1-2 times daily.  This seems to work for him.  Have encouraged him on using his flutter valve.  During flareups he does have hypertonic nebs to use.  Flu shot is up-to-date.  Discussed getting RSV vaccine later this fall.  Plan  Patient Instructions  Continue on Budesonide neb Twice daily   Continue on Ipratropium and Albuterol neb 1-2 times a day  Albuterol inhaler or neb As needed   Activity as tolerated RSV vaccine this fall as discussed  Aspiration precautions as discussed  Claritin 10mg  At bedtime  As needed   Continue on Oxygen 1.5l/m At bedtime   Follow up with Dr. Sherene Sires  in 4 months and As needed  -New patient
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
## 2023-01-13 DIAGNOSIS — J449 Chronic obstructive pulmonary disease, unspecified: Secondary | ICD-10-CM | POA: Diagnosis not present

## 2023-01-13 DIAGNOSIS — I5021 Acute systolic (congestive) heart failure: Secondary | ICD-10-CM | POA: Diagnosis not present

## 2023-01-25 ENCOUNTER — Ambulatory Visit: Payer: Medicare HMO | Attending: Cardiology | Admitting: Cardiology

## 2023-01-25 ENCOUNTER — Encounter: Payer: Self-pay | Admitting: Cardiology

## 2023-01-25 ENCOUNTER — Encounter: Payer: Self-pay | Admitting: *Deleted

## 2023-01-25 VITALS — BP 126/60 | HR 62 | Ht 68.0 in | Wt 144.0 lb

## 2023-01-25 DIAGNOSIS — I2510 Atherosclerotic heart disease of native coronary artery without angina pectoris: Secondary | ICD-10-CM | POA: Diagnosis not present

## 2023-01-25 DIAGNOSIS — I6523 Occlusion and stenosis of bilateral carotid arteries: Secondary | ICD-10-CM

## 2023-01-25 DIAGNOSIS — I5032 Chronic diastolic (congestive) heart failure: Secondary | ICD-10-CM | POA: Diagnosis not present

## 2023-01-25 DIAGNOSIS — E782 Mixed hyperlipidemia: Secondary | ICD-10-CM

## 2023-01-25 DIAGNOSIS — R339 Retention of urine, unspecified: Secondary | ICD-10-CM | POA: Diagnosis not present

## 2023-01-25 DIAGNOSIS — N138 Other obstructive and reflux uropathy: Secondary | ICD-10-CM | POA: Diagnosis not present

## 2023-01-25 DIAGNOSIS — N401 Enlarged prostate with lower urinary tract symptoms: Secondary | ICD-10-CM | POA: Diagnosis not present

## 2023-01-25 NOTE — Patient Instructions (Signed)
Medication Instructions:  Continue all current medications.   Labwork: none  Testing/Procedures: none  Follow-Up: 6 months   Any Other Special Instructions Will Be Listed Below (If Applicable).   If you need a refill on your cardiac medications before your next appointment, please call your pharmacy.  
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
## 2023-02-13 DIAGNOSIS — I5021 Acute systolic (congestive) heart failure: Secondary | ICD-10-CM | POA: Diagnosis not present

## 2023-02-13 DIAGNOSIS — J449 Chronic obstructive pulmonary disease, unspecified: Secondary | ICD-10-CM | POA: Diagnosis not present

## 2023-02-19 ENCOUNTER — Ambulatory Visit: Payer: Medicare HMO | Attending: Cardiology

## 2023-02-19 DIAGNOSIS — I6523 Occlusion and stenosis of bilateral carotid arteries: Secondary | ICD-10-CM

## 2023-02-24 ENCOUNTER — Other Ambulatory Visit: Payer: Self-pay | Admitting: Urology

## 2023-02-25 ENCOUNTER — Other Ambulatory Visit: Payer: Self-pay | Admitting: Cardiology

## 2023-02-25 DIAGNOSIS — N138 Other obstructive and reflux uropathy: Secondary | ICD-10-CM | POA: Diagnosis not present

## 2023-02-25 DIAGNOSIS — N401 Enlarged prostate with lower urinary tract symptoms: Secondary | ICD-10-CM | POA: Diagnosis not present

## 2023-02-25 DIAGNOSIS — R339 Retention of urine, unspecified: Secondary | ICD-10-CM | POA: Diagnosis not present

## 2023-02-26 ENCOUNTER — Telehealth: Payer: Self-pay | Admitting: *Deleted

## 2023-02-26 NOTE — Telephone Encounter (Signed)
Lesle Chris, LPN [id number]  1:30 PM EST Back to Top    Notified wife Natasha Mead), copy to pcp.

## 2023-02-26 NOTE — Telephone Encounter (Signed)
-----   Message from Dina Rich sent at 02/19/2023  3:12 PM EST ----- Mild blocake on the left, moderate on the right. Just something to continue to monitor  Dominga Ferry MD

## 2023-03-05 DIAGNOSIS — K5900 Constipation, unspecified: Secondary | ICD-10-CM | POA: Diagnosis not present

## 2023-03-05 DIAGNOSIS — I739 Peripheral vascular disease, unspecified: Secondary | ICD-10-CM | POA: Diagnosis not present

## 2023-03-05 DIAGNOSIS — N529 Male erectile dysfunction, unspecified: Secondary | ICD-10-CM | POA: Diagnosis not present

## 2023-03-05 DIAGNOSIS — G4730 Sleep apnea, unspecified: Secondary | ICD-10-CM | POA: Diagnosis not present

## 2023-03-05 DIAGNOSIS — Z8249 Family history of ischemic heart disease and other diseases of the circulatory system: Secondary | ICD-10-CM | POA: Diagnosis not present

## 2023-03-05 DIAGNOSIS — I2510 Atherosclerotic heart disease of native coronary artery without angina pectoris: Secondary | ICD-10-CM | POA: Diagnosis not present

## 2023-03-05 DIAGNOSIS — N400 Enlarged prostate without lower urinary tract symptoms: Secondary | ICD-10-CM | POA: Diagnosis not present

## 2023-03-05 DIAGNOSIS — J439 Emphysema, unspecified: Secondary | ICD-10-CM | POA: Diagnosis not present

## 2023-03-05 DIAGNOSIS — E785 Hyperlipidemia, unspecified: Secondary | ICD-10-CM | POA: Diagnosis not present

## 2023-03-05 DIAGNOSIS — K219 Gastro-esophageal reflux disease without esophagitis: Secondary | ICD-10-CM | POA: Diagnosis not present

## 2023-03-05 DIAGNOSIS — Z008 Encounter for other general examination: Secondary | ICD-10-CM | POA: Diagnosis not present

## 2023-03-05 DIAGNOSIS — I110 Hypertensive heart disease with heart failure: Secondary | ICD-10-CM | POA: Diagnosis not present

## 2023-03-05 DIAGNOSIS — I509 Heart failure, unspecified: Secondary | ICD-10-CM | POA: Diagnosis not present

## 2023-03-15 DIAGNOSIS — I5021 Acute systolic (congestive) heart failure: Secondary | ICD-10-CM | POA: Diagnosis not present

## 2023-03-15 DIAGNOSIS — J449 Chronic obstructive pulmonary disease, unspecified: Secondary | ICD-10-CM | POA: Diagnosis not present

## 2023-03-17 ENCOUNTER — Other Ambulatory Visit: Payer: Self-pay | Admitting: Cardiology

## 2023-03-28 DIAGNOSIS — N138 Other obstructive and reflux uropathy: Secondary | ICD-10-CM | POA: Diagnosis not present

## 2023-03-28 DIAGNOSIS — R339 Retention of urine, unspecified: Secondary | ICD-10-CM | POA: Diagnosis not present

## 2023-03-28 DIAGNOSIS — N401 Enlarged prostate with lower urinary tract symptoms: Secondary | ICD-10-CM | POA: Diagnosis not present
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
## 2023-04-01 ENCOUNTER — Ambulatory Visit: Payer: Medicare HMO | Admitting: Internal Medicine

## 2023-04-04 DIAGNOSIS — I5022 Chronic systolic (congestive) heart failure: Secondary | ICD-10-CM | POA: Diagnosis not present

## 2023-04-04 DIAGNOSIS — I700 Atherosclerosis of aorta: Secondary | ICD-10-CM | POA: Diagnosis not present

## 2023-04-04 DIAGNOSIS — R0689 Other abnormalities of breathing: Secondary | ICD-10-CM | POA: Diagnosis not present

## 2023-04-04 DIAGNOSIS — R531 Weakness: Secondary | ICD-10-CM | POA: Diagnosis not present

## 2023-04-04 DIAGNOSIS — U071 COVID-19: Secondary | ICD-10-CM | POA: Diagnosis not present

## 2023-04-04 DIAGNOSIS — R059 Cough, unspecified: Secondary | ICD-10-CM | POA: Diagnosis not present

## 2023-04-04 DIAGNOSIS — I2510 Atherosclerotic heart disease of native coronary artery without angina pectoris: Secondary | ICD-10-CM | POA: Diagnosis not present

## 2023-04-04 DIAGNOSIS — Z79899 Other long term (current) drug therapy: Secondary | ICD-10-CM | POA: Diagnosis not present

## 2023-04-04 DIAGNOSIS — J441 Chronic obstructive pulmonary disease with (acute) exacerbation: Secondary | ICD-10-CM | POA: Diagnosis not present

## 2023-04-04 DIAGNOSIS — E785 Hyperlipidemia, unspecified: Secondary | ICD-10-CM | POA: Diagnosis not present

## 2023-04-04 DIAGNOSIS — Z87891 Personal history of nicotine dependence: Secondary | ICD-10-CM | POA: Diagnosis not present

## 2023-04-04 DIAGNOSIS — R062 Wheezing: Secondary | ICD-10-CM | POA: Diagnosis not present

## 2023-04-04 DIAGNOSIS — R0902 Hypoxemia: Secondary | ICD-10-CM | POA: Diagnosis not present

## 2023-04-04 DIAGNOSIS — R7989 Other specified abnormal findings of blood chemistry: Secondary | ICD-10-CM | POA: Diagnosis not present

## 2023-04-04 DIAGNOSIS — I499 Cardiac arrhythmia, unspecified: Secondary | ICD-10-CM | POA: Diagnosis not present

## 2023-04-04 DIAGNOSIS — K219 Gastro-esophageal reflux disease without esophagitis: Secondary | ICD-10-CM | POA: Diagnosis not present

## 2023-04-04 DIAGNOSIS — N390 Urinary tract infection, site not specified: Secondary | ICD-10-CM | POA: Diagnosis not present

## 2023-04-04 DIAGNOSIS — J439 Emphysema, unspecified: Secondary | ICD-10-CM | POA: Diagnosis not present

## 2023-04-04 DIAGNOSIS — J069 Acute upper respiratory infection, unspecified: Secondary | ICD-10-CM | POA: Diagnosis not present

## 2023-04-04 DIAGNOSIS — I110 Hypertensive heart disease with heart failure: Secondary | ICD-10-CM | POA: Diagnosis not present

## 2023-04-04 DIAGNOSIS — I447 Left bundle-branch block, unspecified: Secondary | ICD-10-CM | POA: Diagnosis not present

## 2023-04-04 DIAGNOSIS — Z7982 Long term (current) use of aspirin: Secondary | ICD-10-CM | POA: Diagnosis not present

## 2023-04-04 DIAGNOSIS — Z743 Need for continuous supervision: Secondary | ICD-10-CM | POA: Diagnosis not present

## 2023-04-11 DIAGNOSIS — J449 Chronic obstructive pulmonary disease, unspecified: Secondary | ICD-10-CM | POA: Diagnosis not present

## 2023-04-11 DIAGNOSIS — I5043 Acute on chronic combined systolic (congestive) and diastolic (congestive) heart failure: Secondary | ICD-10-CM | POA: Diagnosis not present

## 2023-04-11 DIAGNOSIS — I2510 Atherosclerotic heart disease of native coronary artery without angina pectoris: Secondary | ICD-10-CM | POA: Diagnosis not present

## 2023-04-11 DIAGNOSIS — I4891 Unspecified atrial fibrillation: Secondary | ICD-10-CM | POA: Diagnosis not present

## 2023-04-11 DIAGNOSIS — J441 Chronic obstructive pulmonary disease with (acute) exacerbation: Secondary | ICD-10-CM | POA: Diagnosis not present

## 2023-04-11 DIAGNOSIS — U071 COVID-19: Secondary | ICD-10-CM | POA: Diagnosis not present

## 2023-04-11 DIAGNOSIS — Z6821 Body mass index (BMI) 21.0-21.9, adult: Secondary | ICD-10-CM | POA: Diagnosis not present

## 2023-04-15 DIAGNOSIS — I5021 Acute systolic (congestive) heart failure: Secondary | ICD-10-CM | POA: Diagnosis not present

## 2023-04-15 DIAGNOSIS — J449 Chronic obstructive pulmonary disease, unspecified: Secondary | ICD-10-CM | POA: Diagnosis not present

## 2023-04-16 ENCOUNTER — Telehealth: Payer: Self-pay | Admitting: Internal Medicine

## 2023-04-16 NOTE — Telephone Encounter (Signed)
Spoke with patient's wife regarding the Friday 05/03/23 at 11:30 am appointment with Dr. Bradly Bienenstock to Wednesday 05/08/23 at 1:30 pm---Mrs. Apt voiced her understanding and I will mail information to patient

## 2023-04-30 DIAGNOSIS — N138 Other obstructive and reflux uropathy: Secondary | ICD-10-CM | POA: Diagnosis not present

## 2023-04-30 DIAGNOSIS — N401 Enlarged prostate with lower urinary tract symptoms: Secondary | ICD-10-CM | POA: Diagnosis not present

## 2023-04-30 DIAGNOSIS — R339 Retention of urine, unspecified: Secondary | ICD-10-CM | POA: Diagnosis not present

## 2023-05-03 ENCOUNTER — Ambulatory Visit: Payer: Medicare HMO | Admitting: Internal Medicine

## 2023-05-06 NOTE — Progress Notes (Deleted)
 Patient ID: Devin Barton, male   DOB: 04/02/1945, 78 y.o.   MRN: [id number]  HPI  20  yowm with  GOLD III COPD current smoker  seen 4/28 for 3 weeks of cough, wheezing, DOE, yellow mucus worse at night. Had been started on ACE I for HTN 1 month prior to this exacerbation and was treated with a course of antibiotics and prednisone but still had difficulites with cough and dyspnea. Better overall after changed over to Symbicort.  Returned 6/9 improved with less cough and dyspnea. Symbicort costs $50 co-pay-so changed back to advair and on return 7/8 having again severe paroxysms of cough and dyspnea to the point where he can no longer work. I only found out about this at the end of the visit when he requested a work excuse. apparently this occurred after a spell where he lost his voice began feeling choked and very short of breath.   November 03, 2007 ov says couldn't take symbicort due dizziness, using combivent up 3 x days and still smoking one half per day.  rec stop smoking  09/13/10 ov cc worse doe since ran out of money to buy combivent but  Rarely now smoking.    Rec Advair 250/50 twice daily - smooth deep breath then rinse and gargle Continue to use  ventolin but only use as needed if resting first  doesn't help your breathing.   05/08/2023  Re-establish/ was seeing Dr Alva/ ov/[place] office/Devin Barton re: GOLD 3 copd maint on ***  No chief complaint on file.   Dyspnea:  *** Cough: *** Sleeping: ***   resp cc  SABA use: *** 02: ***  Lung cancer screening: ***   No obvious day to day or daytime variability or assoc excess/ purulent sputum or mucus plugs or hemoptysis or cp or chest tightness, subjective wheeze or overt sinus or hb symptoms.    Also denies any obvious fluctuation of symptoms with weather or environmental changes or other aggravating or alleviating factors except as outlined above   No unusual exposure hx or h/o childhood pna/ asthma or knowledge of premature  birth.  Current Allergies, Complete Past Medical History, Past Surgical History, Family History, and Social History were reviewed in [employer] record.  ROS  The following are not active complaints unless bolded Hoarseness, sore throat, dysphagia, dental problems, itching, sneezing,  nasal congestion or discharge of excess mucus or purulent secretions, ear ache,   fever, chills, sweats, unintended wt loss or wt gain, classically pleuritic or exertional cp,  orthopnea pnd or arm/hand swelling  or leg swelling, presyncope, palpitations, abdominal pain, anorexia, nausea, vomiting, diarrhea  or change in bowel habits or change in bladder habits, change in stools or change in urine, dysuria, hematuria,  rash, arthralgias, visual complaints, headache, numbness, weakness or ataxia or problems with walking or coordination,  change in mood or  memory.        No outpatient medications have been marked as taking for the 05/08/23 encounter (Appointment) with Nyoka Cowden, MD.         A     Past Medical History:  Reviewed history from 10/01/2007 and no changes required:  HYPERTENSION, BENIGN (ICD-401.1)  CIGARETTE SMOKER (ICD-305.1)  COPD (ICD-496) FEV1 1.29 (40%) ratio 42%    Family History:   negative for respiratory disease atopy  positive heart disease in his mother           Objective:   Physical Exam  Wt 157 09/13/10   Assessment:         Plan:

## 2023-05-08 ENCOUNTER — Ambulatory Visit: Payer: Medicare HMO | Admitting: Internal Medicine

## 2023-05-12 ENCOUNTER — Other Ambulatory Visit: Payer: Self-pay | Admitting: Cardiology

## 2023-05-16 DIAGNOSIS — I5021 Acute systolic (congestive) heart failure: Secondary | ICD-10-CM | POA: Diagnosis not present

## 2023-05-16 DIAGNOSIS — J449 Chronic obstructive pulmonary disease, unspecified: Secondary | ICD-10-CM | POA: Diagnosis not present

## 2023-05-26 ENCOUNTER — Other Ambulatory Visit: Payer: Self-pay | Admitting: Cardiology

## 2023-05-26 DIAGNOSIS — N401 Enlarged prostate with lower urinary tract symptoms: Secondary | ICD-10-CM | POA: Diagnosis not present

## 2023-05-26 DIAGNOSIS — R339 Retention of urine, unspecified: Secondary | ICD-10-CM | POA: Diagnosis not present

## 2023-05-26 DIAGNOSIS — N138 Other obstructive and reflux uropathy: Secondary | ICD-10-CM | POA: Diagnosis not present

## 2023-06-02 ENCOUNTER — Other Ambulatory Visit: Payer: Self-pay | Admitting: Urology

## 2023-06-10 DIAGNOSIS — D649 Anemia, unspecified: Secondary | ICD-10-CM | POA: Diagnosis not present

## 2023-06-13 DIAGNOSIS — I5021 Acute systolic (congestive) heart failure: Secondary | ICD-10-CM | POA: Diagnosis not present

## 2023-06-13 DIAGNOSIS — J449 Chronic obstructive pulmonary disease, unspecified: Secondary | ICD-10-CM | POA: Diagnosis not present

## 2023-06-15 ENCOUNTER — Other Ambulatory Visit: Payer: Self-pay | Admitting: Cardiology

## 2023-06-15 NOTE — Progress Notes (Unsigned)
 Subjective:     Patient ID: Devin Barton, male   DOB: 1945/12/29    MRN: [id number]  HPI  31  yowm quit smoking 2020  with  GOLD III COPD   seen 4/28 for 3 weeks of cough, wheezing, DOE, yellow mucus worse at night. Had been started on ACE I for HTN 1 month prior to this exacerbation and was treated with a course of antibiotics and prednisone but still had difficulites with cough and dyspnea. Better overall after changed over to Symbicort.  Returned 6/9 improved with less cough and dyspnea. Symbicort costs $50 co-pay-so changed back to advair and on return 7/8 having again severe paroxysms of cough and dyspnea to the point where he can no longer work. I only found out about this at the end of the visit when he requested a work excuse. apparently this occurred after a spell where he lost his voice began feeling choked and very short of breath.   November 03, 2007 ov says couldn't take symbicort due dizziness, using combivent up 3 x days and still smoking one half per day.  rec stop smoking  09/13/10 ov cc worse doe since ran out of money to buy combivent but  Rarely now smoking.  No sign excess/ purulent mucus. Pt denies any significant sore throat, dysphagia, itching, sneezing,  nasal congestion or excess/ purulent secretions,  fever, chills, sweats, unintended wt loss, pleuritic or exertional cp, hempoptysis, orthopnea pnd or leg swelling.   Rec Advair 250/50 twice daily - smooth deep breath then rinse and gargle Continue to use  ventolin but only use as needed if resting first  doesn't help your breathing.  Return here if not happy with the advair or if the doctors in [place] feel you need a pulmonary evaluation   NP  12/27/22  Continue on Budesonide neb Twice daily   Continue on Ipratropium and Albuterol neb 1-2 times a day  Albuterol inhaler or neb As needed   Activity as tolerated RSV vaccine this fall as discussed  Aspiration precautions as discussed  Claritin 10mg  At bedtime   As needed   Continue on Oxygen 1.5l/m At bedtime     06/17/2023  Re establish Vassie Loll pt/ [place] office/[place] [facility] re: GOLD 3 COPD  maint on prn Symbicort  and takes poventil neb just at hs  Chief Complaint  Patient presents with   Establish Care    Former RA patient  Dyspnea:  mb and back is ok / can't identify any activity really where limited by doe (very sedentary)  Cough: minimal rattle, not productive  Sleeping: bed wedge under mattress   and one pillow on top s   resp cc  SABA use: neb at hs with albuterol and then budesonide by  itself in am (not the instruction from np above   02: 1.5 lpm    No obvious day to day or daytime variability or assoc excess/ purulent sputum or mucus plugs or hemoptysis or cp or chest tightness, subjective wheeze or overt sinus or hb symptoms.    Also denies any obvious fluctuation of symptoms with weather or environmental changes or other aggravating or alleviating factors except as outlined above   No unusual exposure hx or h/o childhood pna/ asthma or knowledge of premature birth.  Current Allergies, Complete Past Medical History, Past Surgical History, Family History, and Social History were reviewed in [employer] record.  ROS  The following are not active complaints unless bolded Hoarseness, sore  throat, dysphagia, dental problems, itching, sneezing,  nasal congestion or discharge of excess mucus or purulent secretions, ear ache,   fever, chills, sweats, unintended wt loss or wt gain, classically pleuritic or exertional cp,  orthopnea pnd or arm/hand swelling  or leg swelling, presyncope, palpitations, abdominal pain, anorexia, nausea, vomiting, diarrhea  or change in bowel habits or change in bladder habits, change in stools or change in urine, dysuria, hematuria,  rash, arthralgias, visual complaints, headache, numbness, weakness or ataxia or problems with walking or coordination,  change in mood or  memory.        Current Meds   Medication Sig   aspirin EC 81 MG tablet Take 1 tablet (81 mg total) by mouth daily with breakfast.   atorvastatin (LIPITOR) 40 MG tablet TAKE 1 TABLET BY MOUTH ONCE DAILY AT 6 PM   carvedilol (COREG) 3.125 MG tablet Take 1 tablet by mouth twice daily   Cholecalciferol (VITAMIN D3 PO) Take 1 tablet by mouth daily.   Coenzyme Q10 (COQ10 PO) Take 1 tablet by mouth daily.   furosemide (LASIX) 40 MG tablet TAKE 1 TABLET BY MOUTH ONCE DAILY, MAY TAKE AN ADDITIONAL TABLET FOR WEIGHT GAIN OR SWELLING   losartan (COZAAR) 25 MG tablet Take 1 tablet by mouth once daily   Multiple Vitamins-Minerals (ZINC PO) Take 1 tablet by mouth daily.   tamsulosin (FLOMAX) 0.4 MG CAPS capsule Take 1 capsule by mouth at bedtime   [DISCONTINUED] albuterol (PROVENTIL) (2.5 MG/3ML) 0.083% nebulizer solution Take 3 mLs (2.5 mg total) by nebulization every 8 (eight) hours as needed for wheezing or shortness of breath. Okay to use albuterol and Atrovent nebs separately every 6 hours as needed   [DISCONTINUED] budesonide (PULMICORT) 0.25 MG/2ML nebulizer solution Take 2 mLs (0.25 mg total) by nebulization daily.   [DISCONTINUED] budesonide-formoterol (SYMBICORT) 160-4.5 MCG/ACT inhaler Inhale 2 puffs into the lungs 2 (two) times daily.            Past Medical History:  Reviewed history from 10/01/2007 and no changes required:  HYPERTENSION, BENIGN (ICD-401.1)  CIGARETTE SMOKER (ICD-305.1)  COPD (ICD-496) FEV1 1.29 (40%) ratio 42%         Objective:   Physical Exam    Wt Readings from Last 3 Encounters:  06/17/23 145 lb (65.8 kg)  01/25/23 144 lb (65.3 kg)  12/27/22 144 lb (65.3 kg)  09/13/10           157   Vital signs reviewed  06/17/2023  - Note at rest 02 sats  93% on RA   General appearance:    chronically ill amb wm nad  HEENT :  Oropharynx  clear/ edentulous   Nasal turbinates nl    NECK :  without JVD/Nodes/TM/ nl carotid upstrokes bilaterally   LUNGS: no acc muscle use,  Mod barrel  contour  chest wall with bilateral  exp  wheeze and  without cough on insp or exp maneuvers and mod  Hyperresonant  to  percussion bilaterally     CV:  IRIR at 80  no m or rub or increase in P2 with 1+ pitting both LEs  ABD:  soft and nontender with pos mid insp Hoover's  in the supine position. No bruits or organomegaly appreciated, bowel sounds nl  MS:   Ext warm without deformities or   obvious joint restrictions , calf tenderness, cyanosis or clubbing  SKIN: warm and dry without lesions    NEURO:  alert, approp, nl sensorium with  asym facial muscle  tone            I personally reviewed images and agree with radiology impression as follows:  CXR:   pa and lateral 07/26/22 Post CABG.  COPD changes without acute abnormalities.  Aortic Atherosclerosis (ICD10-I70.0) and Emphysema (ICD10-J43.9).     Assessment:         Plan:

## 2023-06-17 ENCOUNTER — Encounter: Payer: Self-pay | Admitting: Internal Medicine

## 2023-06-17 ENCOUNTER — Other Ambulatory Visit: Payer: Self-pay | Admitting: Internal Medicine

## 2023-06-17 ENCOUNTER — Ambulatory Visit: Payer: Medicare HMO | Admitting: Internal Medicine

## 2023-06-17 VITALS — BP 114/66 | HR 85 | Ht 68.0 in | Wt 145.0 lb

## 2023-06-17 DIAGNOSIS — J9611 Chronic respiratory failure with hypoxia: Secondary | ICD-10-CM

## 2023-06-17 DIAGNOSIS — J439 Emphysema, unspecified: Secondary | ICD-10-CM | POA: Diagnosis not present

## 2023-06-17 DIAGNOSIS — J4489 Other specified chronic obstructive pulmonary disease: Secondary | ICD-10-CM | POA: Diagnosis not present

## 2023-06-17 MED ORDER — BUDESONIDE-FORMOTEROL FUMARATE 160-4.5 MCG/ACT IN AERO
2.0000 | INHALATION_SPRAY | Freq: Two times a day (BID) | RESPIRATORY_TRACT | 5 refills | Status: DC
Start: 2023-06-17 — End: 2023-06-17

## 2023-06-17 MED ORDER — ALBUTEROL SULFATE (2.5 MG/3ML) 0.083% IN NEBU: 2.5000 mg | INHALATION_SOLUTION | Freq: Three times a day (TID) | RESPIRATORY_TRACT | 2 refills | Status: DC | PRN

## 2023-06-17 MED ORDER — BUDESONIDE 0.25 MG/2ML IN SUSP: 0.2500 mg | Freq: Every day | RESPIRATORY_TRACT | 12 refills | Status: AC

## 2023-06-17 MED ORDER — ALBUTEROL SULFATE HFA 108 (90 BASE) MCG/ACT IN AERS: INHALATION_SPRAY | RESPIRATORY_TRACT | 11 refills | Status: DC

## 2023-06-17 NOTE — Patient Instructions (Addendum)
 Plan A = Automatic = Always=    symbicort 160 Take 2 puffs first thing in am and then another 2 puffs about 12 hours later.    Work on inhaler technique:  relax and gently blow all the way out then take a nice smooth full deep breath back in, triggering the inhaler at same time you start breathing in.  Hold breath in for at least  5 seconds if you can. Blow out symbicort  thru nose. Rinse and gargle with water when done.  If mouth or throat bother you at all,  try brushing teeth/gums/tongue with arm and hammer toothpaste/ make a slurry and gargle and spit out.     Plan B = Backup (to supplement plan A, not to replace it) Only use your albuterol inhaler as a rescue medication to be used if you can't catch your breath by resting or doing a relaxed purse lip breathing pattern.  - The less you use it, the better it will work when you need it. - Ok to use the inhaler up to 2 puffs  every 4 hours if you must but call for appointment if use goes up over your usual need - Don't leave home without it !!  (think of it like the spare tire for your car)   Plan C = Crisis (instead of Plan B but only if Plan B stops working) - only use your albuterol nebulizer if you first try Plan B and it fails to help > ok to use the nebulizer up to every 4 hours but if start needing it regularly call for immediate appointment     Please schedule a follow up visit in 3 months but call sooner if needed

## 2023-06-17 NOTE — Telephone Encounter (Signed)
 Please advise change request

## 2023-06-17 NOTE — Assessment & Plan Note (Addendum)
 Quit smoking 2020     - PFT's 09/05/04  FEV1  1.29 ( 40%) ratio 42 and 25 % better p B2 with DLCO 71%  - 06/17/2023  After extensive coaching inhaler device,  effectiveness =  75% from a baseline of 50%   Presently not having limited doe or aecopd - should this develop then can restart symbicort and approp saba: Re SABA :  I spent extra time with pt today reviewing appropriate use of albuterol for prn use on exertion with the following points: 1) saba is for relief of sob that does not improve by walking a slower pace or resting but rather if the pt does not improve after trying this first. 2) If the pt is convinced, as many are, that saba helps recover from activity faster then it's easy to tell if this is the case by re-challenging : ie stop, take the inhaler, then p 5 minutes try the exact same activity (intensity of workload) that just caused the symptoms and see if they are substantially diminished or not after saba 3) if there is an activity that reproducibly causes the symptoms, try the saba 15 min before the activity on alternate days   If in fact the saba really does help, then fine to continue to use it prn but advised may need to look closer at the maintenance regimen being used to achieve better control of airways disease with exertion.   As I explained to this patient in detail:  although there may be copd present, it may not be clinically relevant:   it does not appear to be limiting activity tolerance    That is to say:   this pt is so sedentary I don't recommend aggressive pulmonary rx at this point unless limiting symptoms arise or acute exacerbations become as issue, neither of which is the case now.  I asked the patient to contact this office at any time in the future should either of these problems arise.

## 2023-06-17 NOTE — Assessment & Plan Note (Addendum)
 Well compensated on 1.5 lpm   Advised: Make sure you check your oxygen saturation  AT  your highest level of activity (not after you stop)   to be sure it stays over 90% and adjust  02 flow upward to maintain this level if needed but remember to turn it back to previous settings when you stop (to conserve your supply).    F/u 3 m sooner prn   Each maintenance medication was reviewed in detail including emphasizing most importantly the difference between maintenance and prns and under what circumstances the prns are to be triggered using an action plan format where appropriate.  Total time for H and P, chart review, counseling, reviewing hfa/neb/02/pulse ox  device(s) and generating customized AVS unique to this office visit / same day charting = 42 min for pt not seen by me in over 10 y

## 2023-06-18 ENCOUNTER — Telehealth: Payer: Self-pay

## 2023-06-18 ENCOUNTER — Other Ambulatory Visit (HOSPITAL_COMMUNITY): Payer: Self-pay

## 2023-06-18 NOTE — Telephone Encounter (Signed)
*  Pulm  Pharmacy Patient Advocate Encounter   Received notification from CoverMyMeds that prior authorization for Breyna 160-4.5MCG/ACT aerosol  is required/requested.   Insurance verification completed.   The patient is insured through [facility] .   Per test claim:  [id number] - WIXELA INHUB AER 250/50, [id number] - BREO ELLIPTA INH 100-25, [id number] - FLUTIC/SALME AER 250/50, [id number] - BUDES/FORMOT AER 160-4.5 is preferred by the insurance.  If suggested medication is appropriate, Please send in a new RX and discontinue this one. If not, please advise as to why it's not appropriate so that we may request a Prior Authorization. Please note, some preferred medications may still require a PA.  If the suggested medications have not been trialed and there are no contraindications to their use, the PA will not be submitted, as it will not be approved.

## 2023-06-18 NOTE — Telephone Encounter (Signed)
 NFN until Devin Barton is completed

## 2023-06-25 DIAGNOSIS — N401 Enlarged prostate with lower urinary tract symptoms: Secondary | ICD-10-CM | POA: Diagnosis not present

## 2023-06-25 DIAGNOSIS — R339 Retention of urine, unspecified: Secondary | ICD-10-CM | POA: Diagnosis not present

## 2023-06-25 DIAGNOSIS — N138 Other obstructive and reflux uropathy: Secondary | ICD-10-CM | POA: Diagnosis not present

## 2023-07-14 DIAGNOSIS — I5021 Acute systolic (congestive) heart failure: Secondary | ICD-10-CM | POA: Diagnosis not present

## 2023-07-14 DIAGNOSIS — J449 Chronic obstructive pulmonary disease, unspecified: Secondary | ICD-10-CM | POA: Diagnosis not present

## 2023-07-23 ENCOUNTER — Other Ambulatory Visit: Payer: Self-pay | Admitting: Cardiology

## 2023-07-25 DIAGNOSIS — N138 Other obstructive and reflux uropathy: Secondary | ICD-10-CM | POA: Diagnosis not present

## 2023-07-25 DIAGNOSIS — N401 Enlarged prostate with lower urinary tract symptoms: Secondary | ICD-10-CM | POA: Diagnosis not present

## 2023-07-25 DIAGNOSIS — R339 Retention of urine, unspecified: Secondary | ICD-10-CM | POA: Diagnosis not present

## 2023-08-05 ENCOUNTER — Encounter: Payer: Self-pay | Admitting: Nurse Practitioner

## 2023-08-05 ENCOUNTER — Ambulatory Visit: Attending: Nurse Practitioner | Admitting: Nurse Practitioner

## 2023-08-05 VITALS — BP 106/64 | HR 71 | Ht 68.0 in | Wt 143.0 lb

## 2023-08-05 DIAGNOSIS — I6523 Occlusion and stenosis of bilateral carotid arteries: Secondary | ICD-10-CM | POA: Diagnosis not present

## 2023-08-05 DIAGNOSIS — I2510 Atherosclerotic heart disease of native coronary artery without angina pectoris: Secondary | ICD-10-CM | POA: Diagnosis not present

## 2023-08-05 DIAGNOSIS — I5032 Chronic diastolic (congestive) heart failure: Secondary | ICD-10-CM | POA: Diagnosis not present

## 2023-08-05 DIAGNOSIS — E785 Hyperlipidemia, unspecified: Secondary | ICD-10-CM

## 2023-08-05 NOTE — Progress Notes (Unsigned)
 Cardiology Office Note:  .   Date: 08/05/2023 ID:  Devin Barton, DOB [birth year], MRN [id number] PCP: Minus Amel, MD  [facility] Providers Cardiologist:  Armida Lander, MD    History of Present Illness: .   Devin Barton is a 78 y.o. male with a PMH of CAD, s/p CABG in 2018 (elevated show LAD, SVG-OM1, SVG-acute marginal and sequential to PDA), heart failure with improved ejection fraction, history of postop A-fib, carotid artery stenosis, hyperlipidemia, COPD, who presents today for 62-month follow-up appointment.  Last seen by Dr. Armida Lander on January 25, 2023.  He was overall doing very well at that time.  Today presents for 43-month follow-up appointment.  He states he is doing well.  Denies any acute cardiac complaints or issues. Denies any chest pain, shortness of breath, palpitations, syncope, presyncope, dizziness, orthopnea, PND, swelling or significant weight changes, acute bleeding, or claudication.  ROS: Negative.  See HPI.  Studies Reviewed: Aaron Aas    EKG: EKG Interpretation Date/Time:  Monday Aug 05 2023 11:18:08 EDT Ventricular Rate:  73 PR Interval:  186 QRS Duration:  136 QT Interval:  414 QTC Calculation: 456 R Axis:   -58  Text Interpretation: Normal sinus rhythm Left axis deviation Left bundle branch block When compared with ECG of 26-May-2020 19:05, PREVIOUS ECG IS PRESENT Confirmed by Lasalle Pointer [id number]) on 08/05/2023 11:42:10 AM   Carotid duplex 01/2023: Summary:  Right Carotid: Velocities in the right ICA are consistent with a 1-39%  stenosis. Non-hemodynamically significant plaque <50% noted in the  CCA. The ECA appears <50% stenosed.   Left Carotid: Velocities in the left ICA are consistent with a 60-79%  stenosis. Non-hemodynamically significant plaque <50% noted in the  CCA. The ECA appears <50% stenosed.   Vertebrals:  Bilateral vertebral arteries demonstrate antegrade flow.  Subclavians: Normal flow hemodynamics were seen in  bilateral subclavian arteries.   *See table(s) above for measurements and observations.  Suggest follow up study in 12 months.   Echocardiogram 09/2018: 1. The left ventricle has low normal systolic function, with an ejection  fraction of 50-55%. The cavity size was normal. Left ventricular diastolic  Doppler parameters are consistent with impaired relaxation. Indeterminate filling pressures The E/e' is 8-15. There is abnormal septal motion consistent with left bundle branch block.   2. The right ventricle has normal systolic function. The cavity was  normal. There is no increase in right ventricular wall thickness.   3. The mitral valve is grossly normal.   4. The tricuspid valve is grossly normal.   5. The aortic valve was not well visualized. No stenosis of the aortic  valve.   6. The aortic root and ascending aorta are normal in size and structure.   7. The interatrial septum was not well visualized.   Right/left heart cath 09/2016: Prox RCA lesion, 100 %stenosed. LM lesion, 75 %stenosed. Ost 1st Mrg lesion, 75 %stenosed. Ost LAD to Prox LAD lesion, 90 %stenosed. Mid LAD lesion, 75 %stenosed. LV end diastolic pressure is low. There is no aortic valve stenosis. LV end diastolic pressure is normal. Normal PA pressures. Ao Sat 90%. PA sat 61%. CO 4.2 L.min; CI 2.4 Right radial loop.   Severe three vessel CAD.  Known LV dysfunction from noninvasive testing, but well compensated.  Due to high risk anatomy, will admit the patient and start IV heparin .  Plan for cardiac surgery consult.    Lexiscan  08/2016: There was no ST segment deviation noted during stress. Findings consistent  with large extensive prior myocardial infarctions of the apex, inferior wall, inferoseptal wall, septal wall, and anteroseptal wall.There is no significant current ischemia This is a high risk study. High risk based on large scar burden and decreased LVEF. There is no significant myocardium currently at  jeopardy. Consider correlating LVEF with echo. The left ventricular ejection fraction is moderately decreased (30-44%).  Physical Exam:   VS:  BP 106/64   Pulse 71   Ht 5\' 8"  (1.727 m)   Wt 143 lb (64.9 kg)   SpO2 96%   BMI 21.74 kg/m    Wt Readings from Last 3 Encounters:  08/05/23 143 lb (64.9 kg)  06/17/23 145 lb (65.8 kg)  01/25/23 144 lb (65.3 kg)    GEN: Thin, 78 year old male in no acute distress NECK: No JVD; No carotid bruits CARDIAC: S1/S2, RRR, no murmurs, rubs, gallops RESPIRATORY:  Clear to auscultation without rales, wheezing or rhonchi  ABDOMEN: Soft, non-tender, non-distended EXTREMITIES:  No edema; No deformity   ASSESSMENT AND PLAN: .    HFimpEF Stage C, NYHA class I-II symptoms.  EF 50 to 55% in 2020. Euvolemic and well compensated on exam.  Continue current medication regimen. Low sodium diet, fluid restriction <2L, and daily weights encouraged. Educated to contact our office for weight gain of 2 lbs overnight or 5 lbs in one week.  CAD, s/p CABG in 2018 Stable with no anginal symptoms. No indication for ischemic evaluation.  Continue aspirin , atorvastatin , carvedilol , and losartan . Heart healthy diet and regular cardiovascular exercise encouraged.   Carotid artery stenosis Carotid Doppler in November 2024 revealed right ICA stenosis at 1 to 39%, 60 to 79% stenosis along the left ICA.  He is asymptomatic.  Recommend to update echocardiogram at next office visit.  This will be due in 28 January 2024.  Continue current medication regimen.  Will refill atorvastatin  per his request.  Hyperlipidemia LDL 54 02/2023. Continue atorvastatin . Heart healthy diet and regular cardiovascular exercise encouraged.       Dispo: Follow-up with Dr. Armida Lander or APP in 6 months or sooner if anything changes.  Signed, Lasalle Pointer, NP

## 2023-08-05 NOTE — Patient Instructions (Signed)

## 2023-08-13 DIAGNOSIS — I5021 Acute systolic (congestive) heart failure: Secondary | ICD-10-CM | POA: Diagnosis not present

## 2023-08-13 DIAGNOSIS — J449 Chronic obstructive pulmonary disease, unspecified: Secondary | ICD-10-CM | POA: Diagnosis not present

## 2023-08-25 DIAGNOSIS — N401 Enlarged prostate with lower urinary tract symptoms: Secondary | ICD-10-CM | POA: Diagnosis not present

## 2023-08-25 DIAGNOSIS — R339 Retention of urine, unspecified: Secondary | ICD-10-CM | POA: Diagnosis not present

## 2023-08-25 DIAGNOSIS — N138 Other obstructive and reflux uropathy: Secondary | ICD-10-CM | POA: Diagnosis not present

## 2023-08-30 DIAGNOSIS — Z7982 Long term (current) use of aspirin: Secondary | ICD-10-CM | POA: Diagnosis not present

## 2023-08-30 DIAGNOSIS — E785 Hyperlipidemia, unspecified: Secondary | ICD-10-CM | POA: Diagnosis not present

## 2023-08-30 DIAGNOSIS — R32 Unspecified urinary incontinence: Secondary | ICD-10-CM | POA: Diagnosis not present

## 2023-08-30 DIAGNOSIS — I509 Heart failure, unspecified: Secondary | ICD-10-CM | POA: Diagnosis not present

## 2023-08-30 DIAGNOSIS — I739 Peripheral vascular disease, unspecified: Secondary | ICD-10-CM | POA: Diagnosis not present

## 2023-08-30 DIAGNOSIS — J9610 Chronic respiratory failure, unspecified whether with hypoxia or hypercapnia: Secondary | ICD-10-CM | POA: Diagnosis not present

## 2023-08-30 DIAGNOSIS — Z8249 Family history of ischemic heart disease and other diseases of the circulatory system: Secondary | ICD-10-CM | POA: Diagnosis not present

## 2023-08-30 DIAGNOSIS — I130 Hypertensive heart and chronic kidney disease with heart failure and stage 1 through stage 4 chronic kidney disease, or unspecified chronic kidney disease: Secondary | ICD-10-CM | POA: Diagnosis not present

## 2023-08-30 DIAGNOSIS — I4891 Unspecified atrial fibrillation: Secondary | ICD-10-CM | POA: Diagnosis not present

## 2023-08-30 DIAGNOSIS — J439 Emphysema, unspecified: Secondary | ICD-10-CM | POA: Diagnosis not present

## 2023-08-30 DIAGNOSIS — K219 Gastro-esophageal reflux disease without esophagitis: Secondary | ICD-10-CM | POA: Diagnosis not present

## 2023-08-30 DIAGNOSIS — I700 Atherosclerosis of aorta: Secondary | ICD-10-CM | POA: Diagnosis not present

## 2023-09-02 ENCOUNTER — Other Ambulatory Visit: Payer: Self-pay | Admitting: Urology

## 2023-09-13 DIAGNOSIS — I5021 Acute systolic (congestive) heart failure: Secondary | ICD-10-CM | POA: Diagnosis not present

## 2023-09-13 DIAGNOSIS — J449 Chronic obstructive pulmonary disease, unspecified: Secondary | ICD-10-CM | POA: Diagnosis not present

## 2023-09-17 DIAGNOSIS — J441 Chronic obstructive pulmonary disease with (acute) exacerbation: Secondary | ICD-10-CM | POA: Diagnosis not present

## 2023-09-17 DIAGNOSIS — Z20822 Contact with and (suspected) exposure to covid-19: Secondary | ICD-10-CM | POA: Diagnosis not present

## 2023-09-22 NOTE — Progress Notes (Unsigned)
 Subjective:     Patient ID: Devin Barton, male   DOB: March 24, 1946    MRN: [id number]  HPI  70  yowm quit smoking 2020  with  GOLD III COPD   seen 4/28 for 3 weeks of cough, wheezing, DOE, yellow mucus worse at night. Had been started on ACE I for HTN 1 month prior to this exacerbation and was treated with a course of antibiotics and prednisone  but still had difficulites with cough and dyspnea. Better overall after changed over to Symbicort .  Returned 6/9 improved with less cough and dyspnea. Symbicort  costs $50 co-pay-so changed back to advair and on return 7/8 having again severe paroxysms of cough and dyspnea to the point where he can no longer work. I only found out about this at the end of the visit when he requested a work excuse. apparently this occurred after a spell where he lost his voice began feeling choked and very short of breath.   November 03, 2007 ov says couldn't take symbicort  due dizziness, using combivent up 3 x days and still smoking one half per day.  rec stop smoking  09/13/10 ov cc worse doe since ran out of money to buy combivent but  Rarely now smoking.  No sign excess/ purulent mucus. Pt denies any significant sore throat, dysphagia, itching, sneezing,  nasal congestion or excess/ purulent secretions,  fever, chills, sweats, unintended wt loss, pleuritic or exertional cp, hempoptysis, orthopnea pnd or leg swelling.   Rec Advair 250/50 twice daily - smooth deep breath then rinse and gargle Continue to use  ventolin  but only use as needed if resting first  doesn't help your breathing.  Return here if not happy with the advair or if the doctors in [place] feel you need a pulmonary evaluation   NP  12/27/22  Continue on Budesonide  neb Twice daily   Continue on Ipratropium and Albuterol  neb 1-2 times a day  Albuterol  inhaler or neb As needed   Activity as tolerated RSV vaccine this fall as discussed  Aspiration precautions as discussed  Claritin 10mg  At bedtime   As needed   Continue on Oxygen  1.5l/m At bedtime     06/17/2023  Re establish Devin Barton pt/ [place] office/Devin Barton re: GOLD 3 COPD  maint on prn Symbicort   and takes poventil neb just at hs  Chief Complaint  Patient presents with   Establish Care    Former RA patient  Dyspnea:  mb and back is ok / can't identify any activity really where limited by doe (very sedentary)  Cough: minimal rattle, not productive  Sleeping: bed wedge under mattress   and one pillow on top s   resp cc  SABA use: neb at hs with albuterol  and then budesonide  by  itself in am (not the instruction from np above   02: 1.5 lpm Rec Plan A = Automatic = Always=    symbicort  160 Take 2 puffs first thing in am and then another 2 puffs about 12 hours later.   Work on inhaler technique:   Plan B = Backup (to supplement plan A, not to replace it) Only use your albuterol  inhaler as a rescue medication Plan C = Crisis (instead of Plan B but only if Plan B stops working) - only use your albuterol  nebulizer if you first try Plan B    09/23/2023  f/u ov/Devin Barton office/Devin Barton re: GOLD 3 COPD / 02 dep   maint on symbicort  160 2 bid    Just  finishing medrol  dose pack didn't really help. No longer able to use symbicort  effectively  Chief Complaint  Patient presents with   Follow-up   COPD  Dyspnea:  not able to go mb x 2 weeks fatigue and doe  Cough: worse at bedtime > slt off white  Sleeping: wedge plus one pillow with noct cough awakening  SABA use: just has neb albuterol  and not really using as per ABC plan  02: 2lpm at hs   Lung cancer screening:    No obvious day to day or daytime variability or assoc   mucus plugs or hemoptysis or cp or chest tightness, subjective wheeze or overt sinus or hb symptoms.   Also denies any obvious fluctuation of symptoms with weather or environmental changes or other aggravating or alleviating factors except as outlined above   No unusual exposure hx or h/o childhood pna/ asthma or  knowledge of premature birth.  Current Allergies, Complete Past Medical History, Past Surgical History, Family History, and Social History were reviewed in [employer] record.  ROS  The following are not active complaints unless bolded Hoarseness, sore throat, dysphagia, dental problems, itching, sneezing,  nasal congestion or discharge of excess mucus or purulent secretions, ear ache,   fever, chills, sweats, unintended wt loss or wt gain, classically pleuritic or exertional cp,  orthopnea pnd or arm/hand swelling  or leg swelling, presyncope, palpitations, abdominal pain, anorexia, nausea, vomiting, diarrhea  or change in bowel habits or change in bladder habits, change in stools or change in urine, dysuria, hematuria,  rash, arthralgias, visual complaints, headache, numbness, weakness or ataxia or problems with walking or coordination,  change in mood or  memory.        Current Meds  Medication Sig   albuterol  (PROAIR  HFA) 108 (90 Base) MCG/ACT inhaler 2 puffs every 4 hours as needed only  if your can't catch your breath   aspirin  EC 81 MG tablet Take 1 tablet (81 mg total) by mouth daily with breakfast.   atorvastatin  (LIPITOR) 40 MG tablet TAKE 1 TABLET BY MOUTH ONCE DAILY AT 6 PM   budesonide  (PULMICORT ) 0.25 MG/2ML nebulizer solution Take 2 mLs (0.25 mg total) by nebulization daily.   carvedilol  (COREG ) 3.125 MG tablet Take 1 tablet by mouth twice daily   doxycycline (VIBRAMYCIN) 100 MG capsule Take 100 mg by mouth 2 (two) times daily.   furosemide  (LASIX ) 40 MG tablet TAKE 1 TABLET BY MOUTH ONCE DAILY, MAY TAKE AN ADDITIONAL TABLET FOR WEIGHT GAIN OR SWELLING   losartan  (COZAAR ) 25 MG tablet Take 1 tablet by mouth once daily   methylPREDNISolone  (MEDROL  DOSEPAK) 4 MG TBPK tablet Take 4 mg by mouth See admin instructions. follow package directions   Multiple Vitamins-Minerals (ZINC PO) Take 1 tablet by mouth daily.   promethazine -dextromethorphan  (PROMETHAZINE -DM)  6.25-15 MG/5ML syrup Take 5 mLs by mouth 4 (four) times daily as needed.   tamsulosin  (FLOMAX ) 0.4 MG CAPS capsule Take 1 capsule by mouth at bedtime            Past Medical History:  Reviewed history from 10/01/2007 and no changes required:  HYPERTENSION, BENIGN (ICD-401.1)  CIGARETTE SMOKER (ICD-305.1)  COPD (ICD-496) FEV1 1.29 (40%) ratio 42%         Objective:   Physical Exam   09/23/2023       144  06/17/23 145 lb (65.8 kg)  01/25/23 144 lb (65.3 kg)  12/27/22 144 lb (65.3 kg)  09/13/10  157   Vital signs reviewed  09/23/2023  - Note at rest 02 sats  94% on RA   General appearance:    frail hoarse elderly wm/ no teech/congested cough   HEENT :  Oropharynx  clear/ no teeth  Nasal turbinates nl    NECK :  without JVD/Nodes/TM/ nl carotid upstrokes bilaterally   LUNGS: no acc muscle use,  Mod barrel  contour chest wall with bilateral  Distant bs s audible wheeze and  without cough on insp or exp maneuvers and mod  Hyperresonant  to  percussion bilaterally     CV:  RRR  no s3 or murmur or increase in P2, and no edema   ABD:  soft and nontender with pos mid insp Hoover's  in the supine position. No bruits or organomegaly appreciated, bowel sounds nl  MS:   Ext warm without deformities or   obvious joint restrictions , calf tenderness, cyanosis or clubbing  SKIN: warm and dry without lesions    NEURO:  alert, approp, nl sensorium with  no motor or cerebellar deficits apparent.         CXR PA and Lateral:   09/23/2023 :    I personally reviewed images and agree with radiology impression as follows:    No active cardiopulmonary disease.           Assessment:         Plan:

## 2023-09-23 ENCOUNTER — Encounter: Payer: Self-pay | Admitting: Internal Medicine

## 2023-09-23 ENCOUNTER — Ambulatory Visit: Admitting: Internal Medicine

## 2023-09-23 ENCOUNTER — Ambulatory Visit: Payer: Self-pay | Admitting: Internal Medicine

## 2023-09-23 ENCOUNTER — Ambulatory Visit (HOSPITAL_COMMUNITY)
Admission: RE | Admit: 2023-09-23 | Discharge: 2023-09-23 | Disposition: A | Discharge status: Home / Self Care | Source: Ambulatory Visit | Attending: Internal Medicine | Admitting: Internal Medicine

## 2023-09-23 VITALS — BP 102/63 | HR 93 | Ht 68.0 in | Wt 144.2 lb

## 2023-09-23 DIAGNOSIS — Z87891 Personal history of nicotine dependence: Secondary | ICD-10-CM

## 2023-09-23 DIAGNOSIS — R059 Cough, unspecified: Secondary | ICD-10-CM | POA: Diagnosis not present

## 2023-09-23 DIAGNOSIS — J4489 Other specified chronic obstructive pulmonary disease: Secondary | ICD-10-CM | POA: Insufficient documentation

## 2023-09-23 DIAGNOSIS — J439 Emphysema, unspecified: Secondary | ICD-10-CM | POA: Diagnosis not present

## 2023-09-23 DIAGNOSIS — R062 Wheezing: Secondary | ICD-10-CM | POA: Diagnosis not present

## 2023-09-23 DIAGNOSIS — J9611 Chronic respiratory failure with hypoxia: Secondary | ICD-10-CM | POA: Diagnosis not present

## 2023-09-23 MED ORDER — TRELEGY ELLIPTA 100-62.5-25 MCG/ACT IN AEPB: 1.0000 | INHALATION_SPRAY | Freq: Every day | RESPIRATORY_TRACT | Status: DC

## 2023-09-23 NOTE — Patient Instructions (Addendum)
 Plan A = Automatic = Always=    Trelegy 100 one click 1st thing in am then rinse and gargle  Plan B = Backup (to supplement plan A, not to replace it) Only use your albuterol  nebulizer with budesonide  0.25 mg up to every 4 hours if needed if you can't catch your breath  - call if you find you start needing it more  Make sure you check your oxygen  saturation  AT  your highest level of activity (not after you stop)   to be sure it stays over 90% and adjust  02 flow upward to maintain this level if needed but remember to turn it back to previous settings when you stop (to conserve your supply).   Please remember to go to the lab department   for your tests - we will call you with the results when they are available.      Please remember to go to the  x-ray department  @  [facility] for your tests - we will call you with the results when they are available      Please schedule a follow up office visit in 4 weeks, sooner if needed  with all respiratory medications /inhalers/ solutions in hand so we can verify exactly what you are taking. This includes all medications from all doctors and over the counters

## 2023-09-23 NOTE — Progress Notes (Signed)
 Spoke with pt regarding cxr results, verbalized understanding. NFN

## 2023-09-24 ENCOUNTER — Encounter: Payer: Self-pay | Admitting: Internal Medicine

## 2023-09-24 DIAGNOSIS — R339 Retention of urine, unspecified: Secondary | ICD-10-CM | POA: Diagnosis not present

## 2023-09-24 DIAGNOSIS — N401 Enlarged prostate with lower urinary tract symptoms: Secondary | ICD-10-CM | POA: Diagnosis not present

## 2023-09-24 DIAGNOSIS — N138 Other obstructive and reflux uropathy: Secondary | ICD-10-CM | POA: Diagnosis not present

## 2023-09-24 MED ORDER — ALBUTEROL SULFATE (2.5 MG/3ML) 0.083% IN NEBU: INHALATION_SOLUTION | RESPIRATORY_TRACT | Status: AC

## 2023-09-24 NOTE — Assessment & Plan Note (Addendum)
 As of 09/23/2023 using 02 2lpm hs only   Adequate control on present rx, reviewed in detail with pt > no change in rx needed     F/u p 4 weeks worth of trelegy used on trial basis    Each maintenance medication was reviewed in detail including emphasizing most importantly the difference between maintenance and prns and under what circumstances the prns are to be triggered using an action plan format where appropriate.  Total time for H and P, chart review, counseling, reviewing hfa/dpi/neb/ 02 device(s) and generating customized AVS unique to this office visit / same day charting = 36 min

## 2023-09-24 NOTE — Assessment & Plan Note (Signed)
 Quit smoking 2020     - PFT's 09/05/04  FEV1  1.29 ( 40%) ratio 42 and 25 % better p B2 with DLCO 71%  - 06/17/2023  After extensive coaching inhaler device,  effectiveness =  75% from a baseline of 50%  - 09/23/2023  After extensive coaching inhaler device,  effectiveness =    25% and no better with hfa so changed to dpi > 50% effective with elipta so try trelegy 100  Allergy screen 09/23/2023 >  Eos 0. /  alpha one phenotype pendin g   Group D (now reclassified as E) in terms of symptom/risk and laba/lama/ICS  therefore appropriate rx at this point >>>  trelegy 100 trial approp and use neb alb/bud 0.25 mg up to q 4 h prn as plan B

## 2023-09-26 LAB — CBC WITH DIFFERENTIAL/PLATELET
Basophils Absolute: 0.1 10*3/uL (ref 0.0–0.2)
Basos: 1 %
EOS (ABSOLUTE): 0 10*3/uL (ref 0.0–0.4)
Eos: 0 %
Hematocrit: 43.8 % (ref 37.5–51.0)
Hemoglobin: 13.9 g/dL (ref 13.0–17.7)
Immature Grans (Abs): 0.3 10*3/uL — ABNORMAL HIGH (ref 0.0–0.1)
Immature Granulocytes: 2 %
Lymphocytes Absolute: 2.5 10*3/uL (ref 0.7–3.1)
Lymphs: 17 %
MCH: 32 pg (ref 26.6–33.0)
MCHC: 31.7 g/dL (ref 31.5–35.7)
MCV: 101 fL — ABNORMAL HIGH (ref 79–97)
Monocytes Absolute: 1.4 10*3/uL — ABNORMAL HIGH (ref 0.1–0.9)
Monocytes: 10 %
Neutrophils Absolute: 10 10*3/uL — ABNORMAL HIGH (ref 1.4–7.0)
Neutrophils: 70 %
Platelets: 379 10*3/uL (ref 150–450)
RBC: 4.35 x10E6/uL (ref 4.14–5.80)
RDW: 13.1 % (ref 11.6–15.4)
WBC: 14.3 10*3/uL — ABNORMAL HIGH (ref 3.4–10.8)

## 2023-09-26 LAB — BASIC METABOLIC PANEL WITH GFR
BUN/Creatinine Ratio: 17 (ref 10–24)
BUN: 15 mg/dL (ref 8–27)
CO2: 27 mmol/L (ref 20–29)
Calcium: 9.9 mg/dL (ref 8.6–10.2)
Chloride: 96 mmol/L (ref 96–106)
Creatinine, Ser: 0.9 mg/dL (ref 0.76–1.27)
Glucose: 64 mg/dL — ABNORMAL LOW (ref 70–99)
Potassium: 4.2 mmol/L (ref 3.5–5.2)
Sodium: 140 mmol/L (ref 134–144)
eGFR: 88 mL/min/{1.73_m2} (ref >59)

## 2023-09-26 LAB — ALPHA-1-ANTITRYPSIN PHENOTYP: A-1 Antitrypsin: 121 mg/dL (ref 101–187)

## 2023-09-26 NOTE — Telephone Encounter (Signed)
ATC x1.  LMTCB. 

## 2023-10-01 NOTE — Progress Notes (Signed)
 Spoke with pt regarding his labs results, confirmed his understanding and NFN

## 2023-10-13 DIAGNOSIS — I5021 Acute systolic (congestive) heart failure: Secondary | ICD-10-CM | POA: Diagnosis not present

## 2023-10-13 DIAGNOSIS — J449 Chronic obstructive pulmonary disease, unspecified: Secondary | ICD-10-CM | POA: Diagnosis not present

## 2023-10-22 NOTE — Progress Notes (Unsigned)
 Subjective:     Patient ID: Devin Barton, male   DOB: 1945-07-12    MRN: [id number]  HPI  67  yowm quit smoking 2020  with  GOLD III COPD   seen 4/28 for 3 weeks of cough, wheezing, DOE, yellow mucus worse at night. Had been started on ACE I for HTN 1 month prior to this exacerbation and was treated with a course of antibiotics and prednisone  but still had difficulites with cough and dyspnea. Better overall after changed over to Symbicort .  Returned 6/9 improved with less cough and dyspnea. Symbicort  costs $50 co-pay-so changed back to advair and on return 7/8 having again severe paroxysms of cough and dyspnea to the point where he can no longer work. I only found out about this at the end of the visit when he requested a work excuse. apparently this occurred after a spell where he lost his voice began feeling choked and very short of breath.   November 03, 2007 ov says couldn't take symbicort  due dizziness, using combivent up 3 x days and still smoking one half per day.  rec stop smoking  09/13/10 ov cc worse doe since ran out of money to buy combivent but  Rarely now smoking.  No sign excess/ purulent mucus. Pt denies any significant sore throat, dysphagia, itching, sneezing,  nasal congestion or excess/ purulent secretions,  fever, chills, sweats, unintended wt loss, pleuritic or exertional cp, hempoptysis, orthopnea pnd or leg swelling.   Rec Advair 250/50 twice daily - smooth deep breath then rinse and gargle Continue to use  ventolin  but only use as needed if resting first  doesn't help your breathing.  Return here if not happy with the advair or if the doctors in [place] feel you need a pulmonary evaluation   NP  12/27/22  Continue on Budesonide  neb Twice daily   Continue on Ipratropium and Albuterol  neb 1-2 times a day  Albuterol  inhaler or neb As needed   Activity as tolerated RSV vaccine this fall as discussed  Aspiration precautions as discussed  Claritin 10mg  At bedtime   As needed   Continue on Oxygen  1.5l/m At bedtime     06/17/2023  Re establish [name] pt/ [place] office/Dequante Tremaine re: GOLD 3 COPD  maint on prn Symbicort   and takes poventil neb just at hs  Chief Complaint  Patient presents with   Establish Care    Former RA patient  Dyspnea:  mb and back is ok / can't identify any activity really where limited by doe (very sedentary)  Cough: minimal rattle, not productive  Sleeping: bed wedge under mattress   and one pillow on top s   resp cc  SABA use: neb at hs with albuterol  and then budesonide  by  itself in am (not the instruction from np above   02: 1.5 lpm Rec Plan A = Automatic = Always=    symbicort  160 Take 2 puffs first thing in am and then another 2 puffs about 12 hours later.   Work on inhaler technique:   Plan B = Backup (to supplement plan A, not to replace it) Only use your albuterol  inhaler as a rescue medication Plan C = Crisis (instead of Plan B but only if Plan B stops working) - only use your albuterol  nebulizer if you first try Plan B    09/23/2023  f/u ov/[place] office/Alasdair Kleve re: GOLD 3 COPD / 02 dep   maint on symbicort  160 2 bid    Just  finishing medrol  dose pack didn't really help. No longer able to use symbicort  effectively  Chief Complaint  Patient presents with   Follow-up   COPD  Dyspnea:  not able to go mb x 2 weeks fatigue and doe  Cough: worse at bedtime > slt off white  Sleeping: wedge plus one pillow with noct cough awakening  SABA use: just has neb albuterol  and not really using as per ABC plan  02: 2lpm at hs  Lung cancer screening:  Rec Plan A = Automatic = Always=   Trelegy 100 one click 1st thing in am then rinse and gargle Plan B = Backup (to supplement plan A, not to replace it) Only use your albuterol  nebulizer with budesonide  0.25 mg up to every 4 hours if needed if you can't catch your breath   Make sure you check your oxygen  saturation  AT  your highest level of activity (not after you stop)   to be  sure it stays over 90%   Please remember to go to the  x-ray department   > cxr ok  Please schedule a follow up office visit in 4 weeks, sooner if needed  with all respiratory medications /inhalers/ solutions in hand    Allergy screen 09/23/2023 >  Eos 0.0/  alpha one phenotype  MS/ level 121    10/24/2023  f/u ov/[place] office/Ernesto Zukowski re: GOLD 3 COPD / 02 dep HS maint on Trelegy  did not bring meds  Cc cough > breathing  Dyspnea:  ok to MB and back  Cough: minimal most dry hacking worse on trlelegy  Sleeping: flat bed/ wedge pillow coughing wakens up at least once night  SABA use: once a day hf and neb 02: 2lpm hs   Lung cancer screening: referred today    No obvious day to day or daytime variability or assoc excess/ purulent sputum or mucus plugs or hemoptysis or cp or chest tightness, subjective wheeze or overt sinus or hb symptoms.    Also denies any obvious fluctuation of symptoms with weather or environmental changes or other aggravating or alleviating factors except as outlined above   No unusual exposure hx or h/o childhood pna/ asthma or knowledge of premature birth.  Current Allergies, Complete Past Medical History, Past Surgical History, Family History, and Social History were reviewed in [employer] record.  ROS  The following are not active complaints unless bolded Hoarseness, sore throat, dysphagia, dental problems, itching, sneezing,  nasal congestion or discharge of excess mucus or purulent secretions, ear ache,   fever, chills, sweats, unintended wt loss or wt gain, classically pleuritic or exertional cp,  orthopnea pnd or arm/hand swelling  or leg swelling, presyncope, palpitations, abdominal pain, anorexia, nausea, vomiting, diarrhea  or change in bowel habits or change in bladder habits, change in stools or change in urine, dysuria, hematuria,  rash, arthralgias, visual complaints, headache, numbness, weakness or ataxia or problems with walking  or coordination,  change in mood or  memory.        Current Meds  Medication Sig   albuterol  (PROVENTIL ) (2.5 MG/3ML) 0.083% nebulizer solution Okay to use albuterol  up to every 4 hours as needed with budesonide  0.25 mg   aspirin  EC 81 MG tablet Take 1 tablet (81 mg total) by mouth daily with breakfast.   atorvastatin  (LIPITOR) 40 MG tablet TAKE 1 TABLET BY MOUTH ONCE DAILY AT 6 PM   carvedilol  (COREG ) 3.125 MG tablet Take 1 tablet by mouth twice daily  Fluticasone -Umeclidin-Vilant (TRELEGY ELLIPTA ) 100-62.5-25 MCG/ACT AEPB Inhale 1 puff into the lungs daily.   furosemide  (LASIX ) 40 MG tablet TAKE 1 TABLET BY MOUTH ONCE DAILY, MAY TAKE AN ADDITIONAL TABLET FOR WEIGHT GAIN OR SWELLING   losartan  (COZAAR ) 25 MG tablet Take 1 tablet by mouth once daily   Multiple Vitamins-Minerals (ZINC PO) Take 1 tablet by mouth daily.   tamsulosin  (FLOMAX ) 0.4 MG CAPS capsule Take 1 capsule by mouth at bedtime             Past Medical History:  Reviewed history from 10/01/2007 and no changes required:  HYPERTENSION, BENIGN (ICD-401.1)  CIGARETTE SMOKER (ICD-305.1)  COPD (ICD-496) FEV1 1.29 (40%) ratio 42%         Objective:   Physical Exam   10/24/2023      150 09/23/2023       144  06/17/23 145 lb (65.8 kg)  01/25/23 144 lb (65.3 kg)  12/27/22 144 lb (65.3 kg)  09/13/10           157   Vital signs reviewed  10/24/2023  - Note at rest 02 sats  96% on RA   General appearance:    elderly wm harsh dry sounding coughing fits    HEENT :  Oropharynx  clear / edentulous   Nasal turbinates nl    NECK :  without JVD/Nodes/TM/ nl carotid upstrokes bilaterally   LUNGS: no acc muscle use,  Mod barrel  contour chest wall with bilateral  Distant exp  wheeze and  without cough on insp or exp maneuvers and mod  Hyperresonant  to  percussion bilaterally     CV:  RRR  no s3 or murmur or increase in P2, and no edema   ABD:  soft and nontender with pos mid insp Hoover's  in the supine position. No  bruits or organomegaly appreciated, bowel sounds nl  MS:   Ext warm without deformities or   obvious joint restrictions , calf tenderness, cyanosis or clubbing  SKIN: warm and dry without lesions    NEURO:  alert, approp, nl sensorium with  no motor or cerebellar deficits apparent.                      Assessment:         Plan:

## 2023-10-24 ENCOUNTER — Encounter: Payer: Self-pay | Admitting: Internal Medicine

## 2023-10-24 ENCOUNTER — Ambulatory Visit: Admitting: Internal Medicine

## 2023-10-24 VITALS — BP 98/59 | HR 80 | Ht 68.0 in | Wt 150.2 lb

## 2023-10-24 DIAGNOSIS — Z87891 Personal history of nicotine dependence: Secondary | ICD-10-CM

## 2023-10-24 DIAGNOSIS — J439 Emphysema, unspecified: Secondary | ICD-10-CM

## 2023-10-24 DIAGNOSIS — J9611 Chronic respiratory failure with hypoxia: Secondary | ICD-10-CM

## 2023-10-24 DIAGNOSIS — J4489 Other specified chronic obstructive pulmonary disease: Secondary | ICD-10-CM | POA: Diagnosis not present

## 2023-10-24 MED ORDER — IPRATROPIUM-ALBUTEROL 0.5-2.5 (3) MG/3ML IN SOLN: RESPIRATORY_TRACT | 11 refills | Status: AC

## 2023-10-24 NOTE — Patient Instructions (Addendum)
 My office will be contacting you by phone for referral to lung cancer screening   [phone]) - if you don't hear back from my office within one week,  please call us  back or notify us  thru MyChart and we'll address it right away.    Plan A  automatic/always (try off trelegy)  Duoneb twice daily with budesonide  0.25 mg   (1st thing in am and again after supper)  Plan B:  AS NEEDED  for breakthru cough/ wheeze/ short breath  Albuterol  neb 2.5 up to every 4 hours as needed   For cough > mucinex  dm up to 1200 mg every 12 hours as needed   If cough persists try Try prilosec otc 20mg   Take 30-60 min before first meal of the day and Pepcid  ac (famotidine ) 20 mg one @  bedtime until cough is completely gone for at least a week without the need for cough suppression  In meantime no mint / menthol or chocalate.  Avoid cough drops other than [place] (pectin)      Please schedule a follow up visit in 3 months but call sooner if needed  with all medications /inhalers/ solutions in hand so we can verify exactly what you are taking. This includes all medications from all doctors and over the counters

## 2023-10-25 DIAGNOSIS — R339 Retention of urine, unspecified: Secondary | ICD-10-CM | POA: Diagnosis not present

## 2023-10-25 DIAGNOSIS — N401 Enlarged prostate with lower urinary tract symptoms: Secondary | ICD-10-CM | POA: Diagnosis not present

## 2023-10-25 DIAGNOSIS — N138 Other obstructive and reflux uropathy: Secondary | ICD-10-CM | POA: Diagnosis not present

## 2023-10-25 MED ORDER — BUDESONIDE 0.25 MG/2ML IN SUSP: RESPIRATORY_TRACT | 12 refills | Status: AC

## 2023-10-25 NOTE — Assessment & Plan Note (Signed)
 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

## 2023-10-25 NOTE — Assessment & Plan Note (Addendum)
 Referred for LCS  10/24/2023 >>>  Low-dose CT lung cancer screening is recommended for patients who are 6-78 years of age with a 20+ pack-year history of smoking and who are currently smoking or quit <=15 years ago. No coughing up blood  No unintentional weight loss of > 15 pounds in the last 6 months - pt is eligible for scanning yearly until age 42   Discussed in detail all the  indications, usual  risks and alternatives  relative to the benefits with patient who agrees to proceed with w/u as outlined.            Each maintenance medication was reviewed in detail including emphasizing most importantly the difference between maintenance and prns and under what circumstances the prns are to be triggered using an action plan format where appropriate.  Total time for H and P, chart review, counseling, reviewing neb/ 02/ pulse ox  device(s) and generating customized AVS unique to this office visit / same day charting = 33 min

## 2023-10-25 NOTE — Assessment & Plan Note (Signed)
 As of 09/23/2023 using 02 2lpm hs only   -  Sats ok at rest 10/24/2023   >>>> Advised target sats > 90% also with exertion and should monitor with exertion weekly and prn increased doe.

## 2023-11-10 ENCOUNTER — Other Ambulatory Visit: Payer: Self-pay | Admitting: Cardiology

## 2023-11-13 DIAGNOSIS — I5021 Acute systolic (congestive) heart failure: Secondary | ICD-10-CM | POA: Diagnosis not present

## 2023-11-13 DIAGNOSIS — J449 Chronic obstructive pulmonary disease, unspecified: Secondary | ICD-10-CM | POA: Diagnosis not present

## 2023-11-25 DIAGNOSIS — N138 Other obstructive and reflux uropathy: Secondary | ICD-10-CM | POA: Diagnosis not present

## 2023-11-25 DIAGNOSIS — R339 Retention of urine, unspecified: Secondary | ICD-10-CM | POA: Diagnosis not present

## 2023-11-25 DIAGNOSIS — N401 Enlarged prostate with lower urinary tract symptoms: Secondary | ICD-10-CM | POA: Diagnosis not present

## 2023-12-01 ENCOUNTER — Other Ambulatory Visit: Payer: Self-pay | Admitting: Cardiology

## 2023-12-01 ENCOUNTER — Other Ambulatory Visit: Payer: Self-pay | Admitting: Urology

## 2023-12-04 ENCOUNTER — Other Ambulatory Visit: Payer: Self-pay | Admitting: Urology

## 2023-12-13 ENCOUNTER — Encounter: Payer: Self-pay | Admitting: Urology

## 2023-12-13 ENCOUNTER — Telehealth: Payer: Self-pay

## 2023-12-13 ENCOUNTER — Ambulatory Visit: Payer: Medicare HMO | Admitting: Urology

## 2023-12-13 VITALS — BP 119/67 | HR 73

## 2023-12-13 DIAGNOSIS — N138 Other obstructive and reflux uropathy: Secondary | ICD-10-CM

## 2023-12-13 DIAGNOSIS — R339 Retention of urine, unspecified: Secondary | ICD-10-CM

## 2023-12-13 DIAGNOSIS — N401 Enlarged prostate with lower urinary tract symptoms: Secondary | ICD-10-CM

## 2023-12-13 DIAGNOSIS — R338 Other retention of urine: Secondary | ICD-10-CM

## 2023-12-13 LAB — MICROSCOPIC EXAMINATION: Bacteria, UA: NONE SEEN

## 2023-12-13 LAB — URINALYSIS, ROUTINE W REFLEX MICROSCOPIC
Bilirubin, UA: NEGATIVE
Glucose, UA: NEGATIVE
Ketones, UA: NEGATIVE
Nitrite, UA: NEGATIVE
Protein,UA: NEGATIVE
RBC, UA: NEGATIVE
Specific Gravity, UA: <1.005 — ABNORMAL LOW (ref 1.005–1.030)
Urobilinogen, Ur: 0.2 mg/dL (ref 0.2–1.0)
pH, UA: 6.5 (ref 5.0–7.5)

## 2023-12-13 MED ORDER — TAMSULOSIN HCL 0.4 MG PO CAPS: 0.4000 mg | ORAL_CAPSULE | Freq: Every day | ORAL | 3 refills | Status: AC
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
## 2023-12-13 NOTE — Telephone Encounter (Addendum)
 Office visit notes faxed to [facility] Release: [phone]

## 2023-12-13 NOTE — Patient Instructions (Signed)

## 2023-12-14 DIAGNOSIS — I5021 Acute systolic (congestive) heart failure: Secondary | ICD-10-CM | POA: Diagnosis not present

## 2023-12-14 DIAGNOSIS — J449 Chronic obstructive pulmonary disease, unspecified: Secondary | ICD-10-CM | POA: Diagnosis not present

## 2023-12-18 ENCOUNTER — Telehealth: Payer: Self-pay

## 2023-12-18 NOTE — Telephone Encounter (Signed)
 Records re faxed.   Delivery History  Faxed to [phone]  [email or web address]   delivered at 12/18/2023 1626

## 2023-12-18 NOTE — Telephone Encounter (Signed)
 Return call to [facility] about clinical/progress notes for the past 12 months. Devin Barton was made aware pt clinical notes were efaxed on 12/13/2023. Devin Barton they have not receive any clinical notes for pt. Devin Barton is made aware clinical notes will be refaxed again. Verbalized understanding.

## 2023-12-24 NOTE — Telephone Encounter (Signed)
 Tried calling [facility] to confirmed renewal was received . Wife called and state's [employer] call stating they need renewal from MD.

## 2023-12-25 DIAGNOSIS — R339 Retention of urine, unspecified: Secondary | ICD-10-CM | POA: Diagnosis not present

## 2023-12-25 DIAGNOSIS — N401 Enlarged prostate with lower urinary tract symptoms: Secondary | ICD-10-CM | POA: Diagnosis not present

## 2023-12-25 DIAGNOSIS — N138 Other obstructive and reflux uropathy: Secondary | ICD-10-CM | POA: Diagnosis not present

## 2024-01-13 DIAGNOSIS — I5021 Acute systolic (congestive) heart failure: Secondary | ICD-10-CM | POA: Diagnosis not present

## 2024-01-17 ENCOUNTER — Telehealth: Payer: Self-pay | Admitting: Internal Medicine

## 2024-01-17 ENCOUNTER — Encounter: Payer: Self-pay | Admitting: Internal Medicine

## 2024-01-17 ENCOUNTER — Ambulatory Visit: Admitting: Internal Medicine

## 2024-01-17 VITALS — BP 100/65 | HR 84 | Ht 68.0 in | Wt 148.6 lb

## 2024-01-17 DIAGNOSIS — J4489 Other specified chronic obstructive pulmonary disease: Secondary | ICD-10-CM | POA: Diagnosis not present

## 2024-01-17 DIAGNOSIS — Z87891 Personal history of nicotine dependence: Secondary | ICD-10-CM | POA: Diagnosis not present

## 2024-01-17 DIAGNOSIS — Z23 Encounter for immunization: Secondary | ICD-10-CM

## 2024-01-17 DIAGNOSIS — J9611 Chronic respiratory failure with hypoxia: Secondary | ICD-10-CM

## 2024-01-17 DIAGNOSIS — R490 Dysphonia: Secondary | ICD-10-CM

## 2024-01-17 NOTE — Assessment & Plan Note (Addendum)
 With pseudowheeze on exam 01/17/2024  while on gerd rx > referred to ENT   F/u q 48m, sooner prn  Each maintenance medication was reviewed in detail including emphasizing most importantly the difference between maintenance and prns and under what circumstances the prns are to be triggered using an action plan format where appropriate.  Total time for H and P, chart review, counseling, reviewing neb/ 02 device(s) , directly observing portions of ambulatory 02 saturation study/ and generating customized AVS unique to this office visit / same day charting = 32 min

## 2024-01-17 NOTE — Progress Notes (Signed)
 Subjective:     Patient ID: Devin Barton, male   DOB: Jun 18, 1945    MRN: [id number]  HPI  64  yowm quit smoking 2020  with  GOLD III COPD   seen 4/28 for 3 weeks of cough, wheezing, DOE, yellow mucus worse at night. Had been started on ACE I for HTN 1 month prior to this exacerbation and was treated with a course of antibiotics and prednisone  but still had difficulites with cough and dyspnea. Better overall after changed over to Symbicort .  Returned 6/9 improved with less cough and dyspnea. Symbicort  costs $50 co-pay-so changed back to advair and on return 7/8 having again severe paroxysms of cough and dyspnea to the point where he can no longer work. I only found out about this at the end of the visit when he requested a work excuse. apparently this occurred after a spell where he lost his voice began feeling choked and very short of breath.   November 03, 2007 ov says couldn't take symbicort  due dizziness, using combivent up 3 x days and still smoking one half per day.  rec stop smoking  09/13/10 ov cc worse doe since ran out of money to buy combivent but  Rarely now smoking.  No sign excess/ purulent mucus. Pt denies any significant sore throat, dysphagia, itching, sneezing,  nasal congestion or excess/ purulent secretions,  fever, chills, sweats, unintended wt loss, pleuritic or exertional cp, hempoptysis, orthopnea pnd or leg swelling.   Rec Advair 250/50 twice daily - smooth deep breath then rinse and gargle Continue to use  ventolin  but only use as needed if resting first  doesn't help your breathing.  Return here if not happy with the advair or if the doctors in [place] feel you need a pulmonary evaluation   NP  12/27/22  Continue on Budesonide  neb Twice daily   Continue on Ipratropium and Albuterol  neb 1-2 times a day  Albuterol  inhaler or neb As needed   Activity as tolerated RSV vaccine this fall as discussed  Aspiration precautions as discussed  Claritin 10mg  At bedtime   As needed   Continue on Oxygen  1.5l/m At bedtime     06/17/2023  Re establish [name] pt/ [place] office/Boysie Bonebrake re: GOLD 3 COPD  maint on prn Symbicort   and takes poventil neb just at hs  Chief Complaint  Patient presents with   Establish Care    Former RA patient  Dyspnea:  mb and back is ok / can't identify any activity really where limited by doe (very sedentary)  Cough: minimal rattle, not productive  Sleeping: bed wedge under mattress   and one pillow on top s   resp cc  SABA use: neb at hs with albuterol  and then budesonide  by  itself in am (not the instruction from np above   02: 1.5 lpm Rec Plan A = Automatic = Always=    symbicort  160 Take 2 puffs first thing in am and then another 2 puffs about 12 hours later.   Work on inhaler technique:   Plan B = Backup (to supplement plan A, not to replace it) Only use your albuterol  inhaler as a rescue medication Plan C = Crisis (instead of Plan B but only if Plan B stops working) - only use your albuterol  nebulizer if you first try Plan B    09/23/2023  f/u ov/[place] office/Makenzee Choudhry re: GOLD 3 COPD / 02 dep   maint on symbicort  160 2 bid    Just  finishing medrol  dose pack didn't really help. No longer able to use symbicort  effectively  Chief Complaint  Patient presents with   Follow-up   COPD  Dyspnea:  not able to go mb x 2 weeks fatigue and doe  Cough: worse at bedtime > slt off white  Sleeping: wedge plus one pillow with noct cough awakening  SABA use: just has neb albuterol  and not really using as per ABC plan  02: 2lpm at hs  Lung cancer screening:  Rec Plan A = Automatic = Always=   Trelegy 100 one click 1st thing in am then rinse and gargle Plan B = Backup (to supplement plan A, not to replace it) Only use your albuterol  nebulizer with budesonide  0.25 mg up to every 4 hours if needed if you can't catch your breath   Make sure you check your oxygen  saturation  AT  your highest level of activity (not after you stop)   to be  sure it stays over 90%   Allergy screen 09/23/2023 >  Eos 0.0/  alpha one phenotype  MS/ level 121   10/24/2023  f/u ov/[place] office/Kishon Garriga re: GOLD 3 COPD / 02 dep HS maint on Trelegy  did not bring meds  Cc cough > breathing  Dyspnea:  ok to MB and back  Cough: minimal most dry hacking worse on trlelegy  Sleeping: flat bed/ wedge pillow coughing wakens up at least once night  SABA use: once a day hf and neb 02: 2lpm hs  Lung cancer screening: referred  Rec Plan A  automatic/always (try off trelegy)  Duoneb twice daily with budesonide  0.25 mg   (1st thing in am and again after supper) Plan B:  AS NEEDED  for breakthru cough/ wheeze/ short breath  Albuterol  neb 2.5 up to every 4 hours as needed  For cough > mucinex  dm up to 1200 mg every 12 hours as needed  If cough persists try Try prilosec otc 20mg   Take 30-60 min before first meal of the day and Pepcid  ac (famotidine ) 20 mg one @  bedtime until cough is completely gone for at least a week without the need for cough suppression In meantime no mint / menthol or chocalate.  Avoid cough drops other than [place] (pectin) Please schedule a follow up visit in 3 months but call sooner if needed  with all medications /inhalers/ solutions in hand      01/17/2024  f/u ov/[place] office/Curry Dulski re: GOLD 3 COPD / 02 dep HS maint on duoneb/bud did  bring resp meds   discuss LDSCT  Chief Complaint  Patient presents with   COPD    Shob   Dyspnea:  mb and back 50 ft flat / very sedentary  Cough: none  Sleeping: flat bed/ wedge pillow s    resp cc on gerd rx  SABA use: none 02: 2lpm  Lung cancer screening: sent today    No obvious day to day or daytime variability or assoc excess/ purulent sputum or mucus plugs or hemoptysis or cp or chest tightness, subjective wheeze or overt   hb symptoms.    Also denies any obvious fluctuation of symptoms with weather or environmental changes or other aggravating or alleviating factors except as outlined  above   No unusual exposure hx or h/o childhood pna/ asthma or knowledge of premature birth.  Current Allergies, Complete Past Medical History, Past Surgical History, Family History, and Social History were reviewed in [employer] record.  ROS  The following are not active complaints unless bolded Hoarseness, sore throat(dry more than painful) , dysphagia, dental problems, itching, sneezing,  nasal congestion/ dryness or discharge of excess mucus or purulent secretions, ear ache,   fever, chills, sweats, unintended wt loss or wt gain, classically pleuritic or exertional cp,  orthopnea pnd or arm/hand swelling  or leg swelling, presyncope, palpitations, abdominal pain, anorexia, nausea, vomiting, diarrhea  or change in bowel habits or change in bladder habits, change in stools or change in urine, dysuria, hematuria,  rash, arthralgias, visual complaints, headache, numbness, weakness or ataxia or problems with walking or coordination,  change in mood or  memory.        Current Meds  Medication Sig   albuterol  (PROVENTIL ) (2.5 MG/3ML) 0.083% nebulizer solution Okay to use albuterol  up to every 4 hours as needed with budesonide  0.25 mg   aspirin  EC 81 MG tablet Take 1 tablet (81 mg total) by mouth daily with breakfast.   atorvastatin  (LIPITOR) 40 MG tablet TAKE 1 TABLET BY MOUTH ONCE DAILY AT 6PM   budesonide  (PULMICORT ) 0.25 MG/2ML nebulizer solution One vial twice daily with duoneb   carvedilol  (COREG ) 3.125 MG tablet Take 1 tablet by mouth twice daily   furosemide  (LASIX ) 40 MG tablet TAKE 1 TABLET BY MOUTH ONCE DAILY, MAY TAKE AN ADDITIONAL TABLET FOR WEIGHT GAIN OR SWELLING   ipratropium-albuterol  (DUONEB) 0.5-2.5 (3) MG/3ML SOLN Twice daily with budesonide    losartan  (COZAAR ) 25 MG tablet Take 1 tablet by mouth once daily   Multiple Vitamins-Minerals (ZINC PO) Take 1 tablet by mouth daily.   tamsulosin  (FLOMAX ) 0.4 MG CAPS capsule Take 1 capsule (0.4 mg total) by mouth  at bedtime.         Past Medical History:  Reviewed history from 10/01/2007 and no changes required:  HYPERTENSION, BENIGN (ICD-401.1)  CIGARETTE SMOKER (ICD-305.1)  COPD (ICD-496) FEV1 1.29 (40%) ratio 42%         Objective:   Physical Exam   01/17/2024     148   10/24/2023       150 09/23/2023       144  06/17/23 145 lb (65.8 kg)  01/25/23 144 lb (65.3 kg)  12/27/22 144 lb (65.3 kg)  09/13/10           157   Vital signs reviewed  01/17/2024  - Note at rest 02 sats  94% on RA   General appearance:    chronically ill appearing elderly wm slt facial symmetry (longstanding per wife) and quite hoarse    HEENT :  Oropharynx  clear/ edentulous   Nasal turbinates nl    NECK :  without JVD/Nodes/TM/ nl carotid upstrokes bilaterally   LUNGS: no acc muscle use,  Mod barrel  contour chest wall with bilateral  Distant bs s audible wheeze and  without cough on insp or exp maneuvers and mod  Hyperresonant  to  percussion bilaterally     CV:  RRR  no s3 or murmur or increase in P2, and no edema   ABD:  soft and nontender with pos mid insp Hoover's  in the supine position. No bruits or organomegaly appreciated, bowel sounds nl  MS:   Ext warm without deformities or   obvious joint restrictions , calf tenderness, cyanosis or clubbing  SKIN: warm and dry without lesions    NEURO:  alert, approp, nl sensorium with  no motor or cerebellar deficits apparent.        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 (HCC)  Quit smoking 2020/ alpha one phenotype MS / level 121     - PFT's 09/05/04  FEV1  1.29 ( 40%) ratio 42 and 25 % better p B2 with DLCO 71%  - 06/17/2023  After extensive coaching inhaler device,  effectiveness =  75% from a baseline of 50%  - 09/23/2023  After extensive coaching inhaler device,  effectiveness =    25% and no  better with hfa so changed to dpi > 50% effective with elipta so try trelegy 100  Allergy screen 09/23/2023 >  Eos 0.0/  alpha one phenotype  MS/ level 121  - 10/24/2023 change trelegy to duoneb/bud 0.25 bid due to cough > improved 01/17/2024   Main finding now is worse hoarseness and pseudowheeze despite changing to neb only on continued rx for gerd so rec  >>> continue gerd rx   >>> try humidifying the 02   >>> ENT eval   >>> no change nebs   Chronic respiratory failure with hypoxia (HCC) As of 09/23/2023 using 02 2lpm hs only  - 01/17/2024   Walked on  RA  x  2  lap(s) =  approx 300  ft  @ slow slt awkward pace, stopped due to fatigue, not doe,  with lowest 02 sats 92%   >>> continue 02 2lpm hs and none needed daytime for now   >>> add humidity to 02   Hoarseness With pseudowheeze on exam 01/17/2024  while on gerd rx > referred to ENT   F/u q 75m, sooner prn  Each maintenance medication was reviewed in detail including emphasizing most importantly the difference between maintenance and prns and under what circumstances the prns are to be triggered using an action plan format where appropriate.  Total time for H and P, chart review, counseling, reviewing neb/ 02 device(s) , directly observing portions of ambulatory 02 saturation study/ and generating customized AVS unique to this office visit / same day charting = 32 min                 AVS  Patient Instructions  My office will be contacting you by phone for referral to lung cancer screening   [phone]) - if you don't hear back from my office within one week,  please call us  back or notify us  thru MyChart and we'll address it right away.    We will get your 02 company to humidify your oxygen    and refer you [facility] regarding your hoarseness   No change in medications   Please schedule a follow up visit in 6  months but call sooner if needed        Ozell America, MD 01/17/2024      Plan:

## 2024-01-17 NOTE — Assessment & Plan Note (Addendum)
 As of 09/23/2023 using 02 2lpm hs only  - 01/17/2024   Walked on  RA  x  2  lap(s) =  approx 300  ft  @ slow slt awkward pace, stopped due to fatigue, not doe,  with lowest 02 sats 92%   >>> continue 02 2lpm hs and none needed daytime for now   >>> add humidity to 02

## 2024-01-17 NOTE — Assessment & Plan Note (Addendum)
 Quit smoking 2020/ alpha one phenotype MS / level 121     - PFT's 09/05/04  FEV1  1.29 ( 40%) ratio 42 and 25 % better p B2 with DLCO 71%  - 06/17/2023  After extensive coaching inhaler device,  effectiveness =  75% from a baseline of 50%  - 09/23/2023  After extensive coaching inhaler device,  effectiveness =    25% and no better with hfa so changed to dpi > 50% effective with elipta so try trelegy 100  Allergy screen 09/23/2023 >  Eos 0.0/  alpha one phenotype  MS/ level 121  - 10/24/2023 change trelegy to duoneb/bud 0.25 bid due to cough > improved 01/17/2024   Main finding now is worse hoarseness and pseudowheeze despite changing to neb only on continued rx for gerd so rec  >>> continue gerd rx   >>> try humidifying the 02   >>> ENT eval   >>> no change nebs

## 2024-01-17 NOTE — Telephone Encounter (Signed)
 LVM for patient to call an verify the DME company they are currently using

## 2024-01-17 NOTE — Assessment & Plan Note (Addendum)
 Referred for LCS  10/24/2023 >>>again 01/17/2024  (if eligible thru his insurance)

## 2024-01-17 NOTE — Patient Instructions (Addendum)
 My office will be contacting you by phone for referral to lung cancer screening   [phone]) - if you don't hear back from my office within one week,  please call us  back or notify us  thru MyChart and we'll address it right away.    We will get your 02 company to humidify your oxygen    and refer you [facility] regarding your hoarseness   No change in medications   Please schedule a follow up visit in 6  months but call sooner if needed

## 2024-01-17 NOTE — Telephone Encounter (Signed)
 Copied from CRM [id number]. Topic: Clinical - Order For Equipment >> Jan 17, 2024 12:10 PM Celestine FALCON wrote: Reason for CRM: Pt's wife on [place] is calling after missing a call from the clinic. The latest encounter from 01/17/2024 at 1041am shows K asking for clarification on who the pt uses for DME company. [place] stated the pt uses [employer]  [phone] for the pt's oxygen  supplies.  Pt's phone number is [phone] ok to leave a vm.  Will send DME request .

## 2024-01-21 ENCOUNTER — Telehealth: Payer: Self-pay

## 2024-01-21 NOTE — Telephone Encounter (Signed)
 Order signed and faxed.

## 2024-01-29 ENCOUNTER — Telehealth: Payer: Self-pay | Admitting: *Deleted

## 2024-01-29 NOTE — Telephone Encounter (Signed)
 Spoke with patient and advised that he will not qualify for lung cancer screening due to Medicare age guidelines. Pt verbalized understanding and had no further questions.

## 2024-01-29 NOTE — Telephone Encounter (Signed)
 Attempted to contact patient regarding lung cancer screening. Had to leave voicemail for pt to call back. Need to explain to pt that he does not qualify for lung cancer screening due to CMS age guidelines per Dr Darlean request .

## 2024-01-31 ENCOUNTER — Ambulatory Visit (INDEPENDENT_AMBULATORY_CARE_PROVIDER_SITE_OTHER): Admitting: Otolaryngology

## 2024-01-31 ENCOUNTER — Telehealth: Payer: Self-pay

## 2024-01-31 ENCOUNTER — Encounter (INDEPENDENT_AMBULATORY_CARE_PROVIDER_SITE_OTHER): Payer: Self-pay | Admitting: Otolaryngology

## 2024-01-31 VITALS — BP 121/72 | HR 71 | Temp 97.7°F | Ht 68.0 in | Wt 147.0 lb

## 2024-01-31 DIAGNOSIS — J342 Deviated nasal septum: Secondary | ICD-10-CM

## 2024-01-31 DIAGNOSIS — J3089 Other allergic rhinitis: Secondary | ICD-10-CM | POA: Diagnosis not present

## 2024-01-31 DIAGNOSIS — R490 Dysphonia: Secondary | ICD-10-CM

## 2024-01-31 MED ORDER — FLUTICASONE PROPIONATE 50 MCG/ACT NA SUSP: 2.0000 | Freq: Every day | NASAL | 6 refills | Status: AC
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
## 2024-01-31 NOTE — Telephone Encounter (Signed)
 Signed and faxed on 11/05

## 2024-02-04 NOTE — Telephone Encounter (Signed)
 Pt/wife called about pt cath supplies, state's she received a phone that said pt cath supply form was not received. Pt/wife is aware we will follow up with [facility] about form faxed and signed 11/05. Called [facility], they were missing information and re faxed form. Form completed and faxed 11/11

## 2024-02-05 DIAGNOSIS — R339 Retention of urine, unspecified: Secondary | ICD-10-CM | POA: Diagnosis not present

## 2024-02-05 DIAGNOSIS — N138 Other obstructive and reflux uropathy: Secondary | ICD-10-CM | POA: Diagnosis not present

## 2024-02-05 DIAGNOSIS — N401 Enlarged prostate with lower urinary tract symptoms: Secondary | ICD-10-CM | POA: Diagnosis not present

## 2024-02-13 DIAGNOSIS — J449 Chronic obstructive pulmonary disease, unspecified: Secondary | ICD-10-CM | POA: Diagnosis not present

## 2024-02-13 DIAGNOSIS — I5021 Acute systolic (congestive) heart failure: Secondary | ICD-10-CM | POA: Diagnosis not present

## 2024-02-16 ENCOUNTER — Other Ambulatory Visit: Payer: Self-pay | Admitting: Cardiology

## 2024-02-27 DIAGNOSIS — N401 Enlarged prostate with lower urinary tract symptoms: Secondary | ICD-10-CM | POA: Diagnosis not present

## 2024-02-27 DIAGNOSIS — N138 Other obstructive and reflux uropathy: Secondary | ICD-10-CM | POA: Diagnosis not present

## 2024-02-27 DIAGNOSIS — R339 Retention of urine, unspecified: Secondary | ICD-10-CM | POA: Diagnosis not present

## 2024-03-08 ENCOUNTER — Other Ambulatory Visit: Payer: Self-pay | Admitting: Cardiology

## 2024-03-14 DIAGNOSIS — I5021 Acute systolic (congestive) heart failure: Secondary | ICD-10-CM | POA: Diagnosis not present

## 2024-03-14 DIAGNOSIS — J449 Chronic obstructive pulmonary disease, unspecified: Secondary | ICD-10-CM | POA: Diagnosis not present

## 2024-04-12 ENCOUNTER — Other Ambulatory Visit: Payer: Self-pay | Admitting: Cardiology

## 2024-04-26 ENCOUNTER — Other Ambulatory Visit: Payer: Self-pay | Admitting: Cardiology

## 2024-07-03 ENCOUNTER — Ambulatory Visit: Admitting: Cardiology

## 2024-12-18 ENCOUNTER — Ambulatory Visit: Admitting: Urology
